# Patient Record
Sex: Male | Born: 1940 | Race: White | Hispanic: No | Smoking: Never smoker
Health system: Southern US, Community
[De-identification: ages and names within clinical notes are randomized; demographics above are authoritative.]

## PROBLEM LIST (undated history)

## (undated) DIAGNOSIS — R0602 Shortness of breath: Secondary | ICD-10-CM

## (undated) DIAGNOSIS — G4730 Sleep apnea, unspecified: Secondary | ICD-10-CM

## (undated) DIAGNOSIS — E785 Hyperlipidemia, unspecified: Secondary | ICD-10-CM

## (undated) DIAGNOSIS — E039 Hypothyroidism, unspecified: Secondary | ICD-10-CM

## (undated) DIAGNOSIS — K219 Gastro-esophageal reflux disease without esophagitis: Secondary | ICD-10-CM

## (undated) DIAGNOSIS — I219 Acute myocardial infarction, unspecified: Secondary | ICD-10-CM

## (undated) DIAGNOSIS — Z800 Family history of malignant neoplasm of digestive organs: Secondary | ICD-10-CM

## (undated) DIAGNOSIS — J309 Allergic rhinitis, unspecified: Secondary | ICD-10-CM

## (undated) DIAGNOSIS — Z8249 Family history of ischemic heart disease and other diseases of the circulatory system: Secondary | ICD-10-CM

## (undated) DIAGNOSIS — C801 Malignant (primary) neoplasm, unspecified: Secondary | ICD-10-CM

## (undated) DIAGNOSIS — E662 Morbid (severe) obesity with alveolar hypoventilation: Secondary | ICD-10-CM

## (undated) DIAGNOSIS — I10 Essential (primary) hypertension: Secondary | ICD-10-CM

## (undated) DIAGNOSIS — J449 Chronic obstructive pulmonary disease, unspecified: Secondary | ICD-10-CM

## (undated) DIAGNOSIS — Q859 Phakomatosis, unspecified: Secondary | ICD-10-CM

## (undated) DIAGNOSIS — M1990 Unspecified osteoarthritis, unspecified site: Secondary | ICD-10-CM

## (undated) DIAGNOSIS — I2510 Atherosclerotic heart disease of native coronary artery without angina pectoris: Secondary | ICD-10-CM

## (undated) DIAGNOSIS — J189 Pneumonia, unspecified organism: Secondary | ICD-10-CM

## (undated) HISTORY — DX: Chronic obstructive pulmonary disease, unspecified: J44.9

## (undated) HISTORY — DX: Phakomatosis, unspecified: Q85.9

## (undated) HISTORY — PX: EYE SURGERY: SHX253

## (undated) HISTORY — DX: Sleep apnea, unspecified: G47.30

## (undated) HISTORY — PX: CARPAL TUNNEL RELEASE: SHX101

## (undated) HISTORY — DX: Essential (primary) hypertension: I10

## (undated) HISTORY — DX: Morbid (severe) obesity with alveolar hypoventilation: E66.2

## (undated) HISTORY — PX: KNEE ARTHROPLASTY: SHX992

## (undated) HISTORY — PX: THYROIDECTOMY: SHX17

## (undated) HISTORY — PX: JOINT REPLACEMENT: SHX530

## (undated) HISTORY — DX: Allergic rhinitis, unspecified: J30.9

## (undated) HISTORY — DX: Family history of malignant neoplasm of digestive organs: Z80.0

## (undated) HISTORY — DX: Family history of ischemic heart disease and other diseases of the circulatory system: Z82.49

## (undated) HISTORY — DX: Hyperlipidemia, unspecified: E78.5

---

## 2003-10-02 ENCOUNTER — Inpatient Hospital Stay (HOSPITAL_COMMUNITY): Admission: EM | Admit: 2003-10-02 | Discharge: 2003-10-09 | Payer: Self-pay | Admitting: Emergency Medicine

## 2004-04-22 ENCOUNTER — Emergency Department (HOSPITAL_COMMUNITY): Admission: EM | Admit: 2004-04-22 | Discharge: 2004-04-22 | Payer: Self-pay | Admitting: Emergency Medicine

## 2004-08-07 ENCOUNTER — Encounter: Admission: RE | Admit: 2004-08-07 | Discharge: 2004-08-07 | Payer: Self-pay | Admitting: Family Medicine

## 2004-08-15 ENCOUNTER — Inpatient Hospital Stay (HOSPITAL_COMMUNITY): Admission: RE | Admit: 2004-08-15 | Discharge: 2004-08-18 | Payer: Self-pay | Admitting: Orthopedic Surgery

## 2004-08-15 ENCOUNTER — Ambulatory Visit: Payer: Self-pay | Admitting: Physical Medicine & Rehabilitation

## 2004-08-18 ENCOUNTER — Inpatient Hospital Stay
Admission: RE | Admit: 2004-08-18 | Discharge: 2004-08-29 | Payer: Self-pay | Admitting: Physical Medicine & Rehabilitation

## 2004-08-24 ENCOUNTER — Ambulatory Visit: Payer: Self-pay | Admitting: Physical Medicine & Rehabilitation

## 2004-10-16 ENCOUNTER — Ambulatory Visit (HOSPITAL_COMMUNITY): Admission: RE | Admit: 2004-10-16 | Discharge: 2004-10-16 | Payer: Self-pay | Admitting: Family Medicine

## 2005-04-04 ENCOUNTER — Ambulatory Visit (HOSPITAL_COMMUNITY): Admission: RE | Admit: 2005-04-04 | Discharge: 2005-04-04 | Payer: Self-pay | Admitting: Gastroenterology

## 2005-04-27 ENCOUNTER — Emergency Department (HOSPITAL_COMMUNITY): Admission: EM | Admit: 2005-04-27 | Discharge: 2005-04-27 | Payer: Self-pay | Admitting: Emergency Medicine

## 2005-06-18 ENCOUNTER — Encounter: Admission: RE | Admit: 2005-06-18 | Discharge: 2005-06-18 | Payer: Self-pay | Admitting: Family Medicine

## 2005-07-23 ENCOUNTER — Encounter: Admission: RE | Admit: 2005-07-23 | Discharge: 2005-08-20 | Payer: Self-pay | Admitting: Neurosurgery

## 2005-10-02 ENCOUNTER — Ambulatory Visit (HOSPITAL_BASED_OUTPATIENT_CLINIC_OR_DEPARTMENT_OTHER): Admission: RE | Admit: 2005-10-02 | Discharge: 2005-10-02 | Payer: Self-pay | Admitting: Family Medicine

## 2005-10-06 ENCOUNTER — Ambulatory Visit: Payer: Self-pay | Admitting: Internal Medicine

## 2005-11-13 ENCOUNTER — Ambulatory Visit (HOSPITAL_BASED_OUTPATIENT_CLINIC_OR_DEPARTMENT_OTHER): Admission: RE | Admit: 2005-11-13 | Discharge: 2005-11-13 | Payer: Self-pay | Admitting: Family Medicine

## 2005-11-17 ENCOUNTER — Ambulatory Visit: Payer: Self-pay | Admitting: Internal Medicine

## 2006-02-12 ENCOUNTER — Inpatient Hospital Stay (HOSPITAL_COMMUNITY): Admission: RE | Admit: 2006-02-12 | Discharge: 2006-02-18 | Payer: Self-pay | Admitting: Orthopedic Surgery

## 2006-02-13 ENCOUNTER — Ambulatory Visit: Payer: Self-pay | Admitting: Physical Medicine & Rehabilitation

## 2006-09-05 ENCOUNTER — Ambulatory Visit (HOSPITAL_BASED_OUTPATIENT_CLINIC_OR_DEPARTMENT_OTHER): Admission: RE | Admit: 2006-09-05 | Discharge: 2006-09-05 | Payer: Self-pay | Admitting: Orthopedic Surgery

## 2007-03-27 ENCOUNTER — Emergency Department (HOSPITAL_COMMUNITY): Admission: EM | Admit: 2007-03-27 | Discharge: 2007-03-27 | Payer: Self-pay | Admitting: Emergency Medicine

## 2007-07-16 ENCOUNTER — Encounter: Admission: RE | Admit: 2007-07-16 | Discharge: 2007-07-16 | Payer: Self-pay | Admitting: Ophthalmology

## 2007-07-23 ENCOUNTER — Ambulatory Visit: Payer: Self-pay | Admitting: Pulmonary Disease

## 2007-08-13 ENCOUNTER — Ambulatory Visit: Payer: Self-pay | Admitting: Pulmonary Disease

## 2007-08-24 ENCOUNTER — Ambulatory Visit (HOSPITAL_COMMUNITY): Admission: RE | Admit: 2007-08-24 | Discharge: 2007-08-25 | Payer: Self-pay | Admitting: Orthopedic Surgery

## 2007-09-21 DIAGNOSIS — E785 Hyperlipidemia, unspecified: Secondary | ICD-10-CM

## 2007-09-21 DIAGNOSIS — I110 Hypertensive heart disease with heart failure: Secondary | ICD-10-CM

## 2007-09-21 DIAGNOSIS — R05 Cough: Secondary | ICD-10-CM

## 2007-09-21 DIAGNOSIS — J309 Allergic rhinitis, unspecified: Secondary | ICD-10-CM

## 2007-09-21 DIAGNOSIS — G4730 Sleep apnea, unspecified: Secondary | ICD-10-CM | POA: Insufficient documentation

## 2007-09-22 ENCOUNTER — Encounter: Payer: Self-pay | Admitting: Endocrinology

## 2007-10-01 ENCOUNTER — Ambulatory Visit: Payer: Self-pay | Admitting: Pulmonary Disease

## 2007-10-01 DIAGNOSIS — R0602 Shortness of breath: Secondary | ICD-10-CM | POA: Insufficient documentation

## 2007-10-22 HISTORY — PX: CHOLECYSTECTOMY: SHX55

## 2007-10-27 ENCOUNTER — Ambulatory Visit: Payer: Self-pay | Admitting: Pulmonary Disease

## 2007-11-03 ENCOUNTER — Ambulatory Visit (HOSPITAL_COMMUNITY): Admission: RE | Admit: 2007-11-03 | Discharge: 2007-11-03 | Payer: Self-pay | Admitting: Pulmonary Disease

## 2007-11-08 ENCOUNTER — Telehealth: Payer: Self-pay | Admitting: Pulmonary Disease

## 2007-11-09 ENCOUNTER — Telehealth (INDEPENDENT_AMBULATORY_CARE_PROVIDER_SITE_OTHER): Payer: Self-pay | Admitting: *Deleted

## 2008-01-12 ENCOUNTER — Encounter: Payer: Self-pay | Admitting: Endocrinology

## 2008-01-19 ENCOUNTER — Encounter: Payer: Self-pay | Admitting: Endocrinology

## 2008-01-27 ENCOUNTER — Ambulatory Visit (HOSPITAL_BASED_OUTPATIENT_CLINIC_OR_DEPARTMENT_OTHER): Admission: RE | Admit: 2008-01-27 | Discharge: 2008-01-27 | Payer: Self-pay | Admitting: Ophthalmology

## 2008-02-04 ENCOUNTER — Encounter: Payer: Self-pay | Admitting: Endocrinology

## 2008-03-01 ENCOUNTER — Encounter: Payer: Self-pay | Admitting: Endocrinology

## 2008-03-08 ENCOUNTER — Encounter: Payer: Self-pay | Admitting: Endocrinology

## 2008-03-31 ENCOUNTER — Ambulatory Visit: Payer: Self-pay | Admitting: Endocrinology

## 2008-03-31 DIAGNOSIS — E890 Postprocedural hypothyroidism: Secondary | ICD-10-CM

## 2008-03-31 DIAGNOSIS — Q859 Phakomatosis, unspecified: Secondary | ICD-10-CM

## 2008-03-31 DIAGNOSIS — E209 Hypoparathyroidism, unspecified: Secondary | ICD-10-CM | POA: Insufficient documentation

## 2008-03-31 LAB — CONVERTED CEMR LAB: Magnesium: 2.1 mg/dL (ref 1.5–2.5)

## 2008-04-13 ENCOUNTER — Telehealth: Payer: Self-pay | Admitting: Pulmonary Disease

## 2008-04-14 ENCOUNTER — Ambulatory Visit: Payer: Self-pay | Admitting: Endocrinology

## 2008-04-15 ENCOUNTER — Encounter: Payer: Self-pay | Admitting: Endocrinology

## 2008-04-19 ENCOUNTER — Encounter: Payer: Self-pay | Admitting: Endocrinology

## 2008-04-19 LAB — CONVERTED CEMR LAB: PTH: <2.5 pg/mL — ABNORMAL LOW (ref 14.0–72.0)

## 2008-04-28 ENCOUNTER — Encounter: Payer: Self-pay | Admitting: Pulmonary Disease

## 2008-04-28 ENCOUNTER — Ambulatory Visit: Payer: Self-pay | Admitting: Pulmonary Disease

## 2008-04-28 ENCOUNTER — Ambulatory Visit: Payer: Self-pay | Admitting: Cardiology

## 2008-04-28 ENCOUNTER — Ambulatory Visit: Payer: Self-pay

## 2008-04-28 ENCOUNTER — Telehealth (INDEPENDENT_AMBULATORY_CARE_PROVIDER_SITE_OTHER): Payer: Self-pay | Admitting: *Deleted

## 2008-04-28 DIAGNOSIS — R930 Abnormal findings on diagnostic imaging of skull and head, not elsewhere classified: Secondary | ICD-10-CM

## 2008-04-28 DIAGNOSIS — R079 Chest pain, unspecified: Secondary | ICD-10-CM

## 2008-04-29 ENCOUNTER — Ambulatory Visit (HOSPITAL_COMMUNITY): Admission: RE | Admit: 2008-04-29 | Discharge: 2008-04-29 | Payer: Self-pay | Admitting: Pulmonary Disease

## 2008-05-03 ENCOUNTER — Telehealth: Payer: Self-pay | Admitting: Pulmonary Disease

## 2008-05-04 ENCOUNTER — Encounter: Payer: Self-pay | Admitting: Pulmonary Disease

## 2008-05-04 DIAGNOSIS — K8020 Calculus of gallbladder without cholecystitis without obstruction: Secondary | ICD-10-CM | POA: Insufficient documentation

## 2008-05-06 ENCOUNTER — Telehealth (INDEPENDENT_AMBULATORY_CARE_PROVIDER_SITE_OTHER): Payer: Self-pay | Admitting: *Deleted

## 2008-05-10 ENCOUNTER — Ambulatory Visit (HOSPITAL_COMMUNITY): Admission: RE | Admit: 2008-05-10 | Discharge: 2008-05-10 | Payer: Self-pay | Admitting: Pulmonary Disease

## 2008-06-02 ENCOUNTER — Telehealth: Payer: Self-pay | Admitting: Pulmonary Disease

## 2008-08-15 ENCOUNTER — Encounter (INDEPENDENT_AMBULATORY_CARE_PROVIDER_SITE_OTHER): Payer: Self-pay | Admitting: General Surgery

## 2008-08-15 ENCOUNTER — Ambulatory Visit (HOSPITAL_COMMUNITY): Admission: RE | Admit: 2008-08-15 | Discharge: 2008-08-16 | Payer: Self-pay | Admitting: General Surgery

## 2008-12-29 ENCOUNTER — Inpatient Hospital Stay (HOSPITAL_COMMUNITY): Admission: EM | Admit: 2008-12-29 | Discharge: 2008-12-31 | Payer: Self-pay | Admitting: Emergency Medicine

## 2008-12-30 ENCOUNTER — Encounter: Payer: Self-pay | Admitting: Pulmonary Disease

## 2008-12-30 ENCOUNTER — Encounter (INDEPENDENT_AMBULATORY_CARE_PROVIDER_SITE_OTHER): Payer: Self-pay | Admitting: Internal Medicine

## 2009-02-14 ENCOUNTER — Ambulatory Visit: Payer: Self-pay | Admitting: Pulmonary Disease

## 2009-02-14 DIAGNOSIS — R918 Other nonspecific abnormal finding of lung field: Secondary | ICD-10-CM

## 2009-02-14 DIAGNOSIS — E678 Other specified hyperalimentation: Secondary | ICD-10-CM

## 2009-02-15 ENCOUNTER — Telehealth (INDEPENDENT_AMBULATORY_CARE_PROVIDER_SITE_OTHER): Payer: Self-pay | Admitting: *Deleted

## 2009-06-13 ENCOUNTER — Inpatient Hospital Stay (HOSPITAL_COMMUNITY): Admission: EM | Admit: 2009-06-13 | Discharge: 2009-06-15 | Payer: Self-pay | Admitting: Emergency Medicine

## 2009-06-14 ENCOUNTER — Encounter (INDEPENDENT_AMBULATORY_CARE_PROVIDER_SITE_OTHER): Payer: Self-pay | Admitting: Internal Medicine

## 2009-06-15 ENCOUNTER — Encounter (INDEPENDENT_AMBULATORY_CARE_PROVIDER_SITE_OTHER): Payer: Self-pay | Admitting: Internal Medicine

## 2009-08-02 ENCOUNTER — Encounter (HOSPITAL_BASED_OUTPATIENT_CLINIC_OR_DEPARTMENT_OTHER): Admission: RE | Admit: 2009-08-02 | Discharge: 2009-09-21 | Payer: Self-pay | Admitting: Internal Medicine

## 2009-08-28 ENCOUNTER — Encounter: Payer: Self-pay | Admitting: Pulmonary Disease

## 2009-11-08 ENCOUNTER — Emergency Department (HOSPITAL_COMMUNITY): Admission: EM | Admit: 2009-11-08 | Discharge: 2009-11-08 | Payer: Self-pay | Admitting: Emergency Medicine

## 2009-11-28 ENCOUNTER — Inpatient Hospital Stay (HOSPITAL_COMMUNITY): Admission: EM | Admit: 2009-11-28 | Discharge: 2009-12-08 | Payer: Self-pay | Admitting: Emergency Medicine

## 2009-11-28 ENCOUNTER — Ambulatory Visit: Payer: Self-pay | Admitting: Emergency Medicine

## 2009-12-08 ENCOUNTER — Inpatient Hospital Stay: Admission: RE | Admit: 2009-12-08 | Discharge: 2009-12-29 | Payer: Self-pay | Admitting: Internal Medicine

## 2010-04-18 ENCOUNTER — Emergency Department (HOSPITAL_COMMUNITY): Admission: EM | Admit: 2010-04-18 | Discharge: 2010-04-18 | Payer: Self-pay | Admitting: Emergency Medicine

## 2010-05-03 ENCOUNTER — Ambulatory Visit (HOSPITAL_COMMUNITY): Admission: RE | Admit: 2010-05-03 | Discharge: 2010-05-03 | Payer: Self-pay | Admitting: Gastroenterology

## 2010-06-20 ENCOUNTER — Emergency Department (HOSPITAL_COMMUNITY): Admission: EM | Admit: 2010-06-20 | Discharge: 2010-06-20 | Payer: Self-pay | Admitting: Emergency Medicine

## 2010-07-02 ENCOUNTER — Ambulatory Visit: Payer: Self-pay | Admitting: Pulmonary Disease

## 2010-07-02 DIAGNOSIS — J986 Disorders of diaphragm: Secondary | ICD-10-CM

## 2010-07-08 ENCOUNTER — Ambulatory Visit: Payer: Self-pay | Admitting: Cardiology

## 2010-07-08 ENCOUNTER — Observation Stay (HOSPITAL_COMMUNITY): Admission: EM | Admit: 2010-07-08 | Discharge: 2010-07-11 | Payer: Self-pay | Admitting: Emergency Medicine

## 2010-07-10 ENCOUNTER — Encounter (INDEPENDENT_AMBULATORY_CARE_PROVIDER_SITE_OTHER): Payer: Self-pay | Admitting: Internal Medicine

## 2010-08-29 ENCOUNTER — Encounter: Admission: RE | Admit: 2010-08-29 | Discharge: 2010-08-29 | Payer: Self-pay | Admitting: Internal Medicine

## 2010-09-26 ENCOUNTER — Emergency Department (HOSPITAL_COMMUNITY)
Admission: EM | Admit: 2010-09-26 | Discharge: 2010-09-26 | Payer: Self-pay | Source: Home / Self Care | Admitting: Family Medicine

## 2010-11-11 ENCOUNTER — Encounter: Payer: Self-pay | Admitting: Neurosurgery

## 2010-11-11 ENCOUNTER — Encounter: Payer: Self-pay | Admitting: Pulmonary Disease

## 2010-11-12 ENCOUNTER — Encounter: Payer: Self-pay | Admitting: Neurosurgery

## 2010-11-16 ENCOUNTER — Encounter
Admission: RE | Admit: 2010-11-16 | Discharge: 2010-11-16 | Payer: Self-pay | Source: Home / Self Care | Attending: Internal Medicine | Admitting: Internal Medicine

## 2010-11-18 LAB — CONVERTED CEMR LAB
BUN: 22 mg/dL (ref 6–23)
Creatinine, Ser: 1.8 mg/dL — ABNORMAL HIGH (ref 0.4–1.5)

## 2010-11-20 NOTE — Assessment & Plan Note (Signed)
Summary: rov for OHS, dyspnea   Primary Provider/Referring Provider:  Dr Allena Katz  CC:  last seen 01/2009. Pt was seen in hospital 11/2009 for resp. failure.[name] [name]  History of Present Illness: The pt comes in today for f/u of his known OHS and dyspnea related to his obesity/deconditioning/left HD dysfunction.  He has been doing well over the last 6mos, but is wondering if he needs oxygen with exertion.  He has been told by physical therapist this may help.  He denies any cough, congestion, or mucus.  He is wearing his oxygen compliantly at HS.  Preventive Screening-Counseling & Management  Alcohol-Tobacco     Smoking Status: never  Current Medications (verified): 1)  Synthroid 200 Mcg  Tabs (Levothyroxine Sodium) .... Take One Tab By Mouth Once Daily 2)  Plavix 75 Mg  Tabs (Clopidogrel Bisulfate) .... Take One Tab By Mouth Once Daily 3)  Astelin 137 Mcg/spray  Soln (Azelastine Hcl) .... Inhale One Spray in Each Nostril Two Times A Day 4)  Miralax   Powd (Polyethylene Glycol 3350) .... Use As Needed 5)  Nitrolingual 0.4 Mg/spray Soln (Nitroglycerin) .... Use As Directed As Needed For Chest Pain 6)  Flomax 0.4 Mg Xr24h-Cap (Tamsulosin Hcl) .... Take 1 Tablet By Mouth Once A Day 7)  Hydrochlorothiazide 12.5 Mg Caps (Hydrochlorothiazide) .... Take 1 Tablet By Mouth Once A Day 8)  Lyrica 75 Mg Caps (Pregabalin) .... Take 1 Tablet By Mouth Once A Day 9)  Veramyst 27.5 Mcg/spray Susp (Fluticasone Furoate) .... 2 Sprays in Each Nostril Once Daily 10)  Nystatin-Triamcinolone 100000-0.1 Unit/gm-% Crea (Nystatin-Triamcinolone) .... Use As Directed As Needed 11)  Zofran 4 Mg Tabs (Ondansetron Hcl) .[name] [name].. 1 Tab Three Times A Day 12)  Rocaltrol 0.5 Mcg Caps (Calcitriol) .[name] [name].. 1 Tab Two Times A Day 13)  Pantoprazole Sodium 40 Mg Tbec (Pantoprazole Sodium) .... One Tab Once Daily 14)  Aspirin 81 Mg Tabs (Aspirin) .... Once Daily 15)  Lorazepam 0.5 Mg Tabs (Lorazepam) .... 1/2 Tab Three Times A Day 16)  Melatonin 3  Mg Tabs (Melatonin) .... Once Daily 17)  Dulcolax 5 Mg Tbec (Bisacodyl) .... Take 2 Tab As Needed 18)  Tramadol Hcl 50 Mg Tabs (Tramadol Hcl) .[name] [name].. 1 Tab By Mouth Every 4 Hours As Needed 19)  Arthrotec 75-200 Mg-Mcg Tabs (Diclofenac-Misoprostol) .... Two Times A Day 20)  Plavix 75 Mg Tabs (Clopidogrel Bisulfate) .... 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  The patient denies productive cough, non-productive cough, coughing up blood, irregular heartbeats, acid heartburn, indigestion, loss of appetite, weight change, abdominal pain, difficulty swallowing, sore throat, tooth/dental problems, headaches, sneezing, itching, ear ache, rash, change in color of mucus, and fever.    Vital Signs:  Patient profile:   70 year old male Height:      74 inches Weight:      301.50 pounds BMI:     38.85 O2 Sat:      90 % on Room air Pulse rate:   76 / minute Resp:     98.0 per minute BP sitting:  90 / 60  (right arm) Cuff size:   large  Vitals Entered By: Carver Fila (July 02, 2010 9:55 AM)  O2 Flow:  Room air CC: last seen 01/2009. Pt was seen in hospital 11/2009 for resp. failure. Comments Phone number updated Carver Fila  July 02, 2010 9:55 AM    Physical Exam  General:  obese male in nad Lungs:  minimal basilar crackles, no wheezing or rhonchi Heart:  rrr, no mrg Extremities:  1+ edema, a few open sores  that need to be covered. no cyanosis  Neurologic:  alert, oriented,moves all 4.   Impression & Recommendations:  Problem # 1:  OBESITY HYPOVENTILATION SYNDROME (ICD-278.8) the pt wears oxygen at HS, and has failed trials of cpap/bipap under the direction of Dr. Vickey Huger in the past.  I have asked him to continue to exercise, work on weight loss, and to wear oxygen at HS.  Surprisingly, he did not qualify for oxygen with exertion, but did get very sob.  Medications Added to Medication List This Visit: 1)  Zofran 4 Mg Tabs (Ondansetron hcl) .[name] [name].. 1 tab three times a day 2)  Rocaltrol 0.5 Mcg Caps (Calcitriol) .[name] [name].. 1 tab two times a day 3)  Pantoprazole Sodium 40 Mg Tbec (Pantoprazole sodium) .... One tab once daily 4)  Aspirin 81 Mg Tabs (Aspirin) .... Once daily 5)  Lorazepam 0.5 Mg Tabs (Lorazepam) .... 1/2 tab three times a day 6)  Melatonin 3 Mg Tabs (Melatonin) .... Once daily 7)  Dulcolax 5 Mg Tbec (Bisacodyl) .... Take 2 tab as needed 8)  Tramadol Hcl 50 Mg Tabs (Tramadol hcl) .[name] [name].. 1 tab by mouth every 4 hours as needed 9)  Arthrotec 75-200 Mg-mcg Tabs (Diclofenac-misoprostol) .... Two times a day 10)  Plavix 75 Mg Tabs (Clopidogrel bisulfate) .... Once daily  Other Orders: Est. Patient Level III (47829) Pulse Oximetry, Ambulatory (56213)  Patient Instructions: 1)  work on weight loss 2)  stay on oxygen while sleeping.  You do not need oxygen with walking. 3)  followup with me in one year or sooner if having problems.   .Ambulatory Pulse Oximetry  Resting; HR___78__    02 Sat__94 ra___  Lap1 (185 feet)   HR__101___   02 Sat94 ra_____ Lap2 (185 feet)   HR__100___   02 Sat_94 ra____    Lap3 (185 feet)   HR__106___   02 Sat_95____  __x_Test Completed without Difficulty pt was  panting   .Kandice Hams CMA  July 02, 2010 10:32 AM   ___Test Stopped due to:

## 2010-11-28 ENCOUNTER — Other Ambulatory Visit: Payer: Self-pay | Admitting: Internal Medicine

## 2010-11-28 DIAGNOSIS — R0600 Dyspnea, unspecified: Secondary | ICD-10-CM

## 2010-11-28 DIAGNOSIS — R634 Abnormal weight loss: Secondary | ICD-10-CM

## 2010-12-03 ENCOUNTER — Ambulatory Visit
Admission: RE | Admit: 2010-12-03 | Discharge: 2010-12-03 | Disposition: A | Discharge status: Home / Self Care | Payer: Medicare Other | Source: Ambulatory Visit | Attending: Internal Medicine | Admitting: Internal Medicine

## 2010-12-03 DIAGNOSIS — R0600 Dyspnea, unspecified: Secondary | ICD-10-CM

## 2010-12-03 DIAGNOSIS — R634 Abnormal weight loss: Secondary | ICD-10-CM

## 2010-12-03 MED ORDER — IOHEXOL 300 MG/ML  SOLN
75.0000 mL | Freq: Once | INTRAMUSCULAR | Status: AC | PRN
  Administered 2010-12-03: 75 mL via INTRAVENOUS

## 2010-12-13 ENCOUNTER — Observation Stay (HOSPITAL_COMMUNITY)
Admission: EM | Admit: 2010-12-13 | Discharge: 2010-12-14 | Disposition: A | Discharge status: Skilled Nursing Facility | Payer: Medicare Other | Attending: Internal Medicine | Admitting: Internal Medicine

## 2010-12-13 ENCOUNTER — Emergency Department (HOSPITAL_COMMUNITY): Payer: Medicare Other

## 2010-12-13 DIAGNOSIS — I2510 Atherosclerotic heart disease of native coronary artery without angina pectoris: Secondary | ICD-10-CM | POA: Insufficient documentation

## 2010-12-13 DIAGNOSIS — M1990 Unspecified osteoarthritis, unspecified site: Secondary | ICD-10-CM | POA: Insufficient documentation

## 2010-12-13 DIAGNOSIS — E785 Hyperlipidemia, unspecified: Secondary | ICD-10-CM | POA: Insufficient documentation

## 2010-12-13 DIAGNOSIS — I5030 Unspecified diastolic (congestive) heart failure: Secondary | ICD-10-CM | POA: Insufficient documentation

## 2010-12-13 DIAGNOSIS — K219 Gastro-esophageal reflux disease without esophagitis: Secondary | ICD-10-CM | POA: Insufficient documentation

## 2010-12-13 DIAGNOSIS — E039 Hypothyroidism, unspecified: Secondary | ICD-10-CM | POA: Insufficient documentation

## 2010-12-13 DIAGNOSIS — R079 Chest pain, unspecified: Principal | ICD-10-CM | POA: Insufficient documentation

## 2010-12-13 DIAGNOSIS — R0609 Other forms of dyspnea: Secondary | ICD-10-CM | POA: Insufficient documentation

## 2010-12-13 DIAGNOSIS — I10 Essential (primary) hypertension: Secondary | ICD-10-CM | POA: Insufficient documentation

## 2010-12-13 DIAGNOSIS — F411 Generalized anxiety disorder: Secondary | ICD-10-CM | POA: Insufficient documentation

## 2010-12-13 DIAGNOSIS — F0390 Unspecified dementia without behavioral disturbance: Secondary | ICD-10-CM | POA: Insufficient documentation

## 2010-12-13 DIAGNOSIS — R0989 Other specified symptoms and signs involving the circulatory and respiratory systems: Secondary | ICD-10-CM | POA: Insufficient documentation

## 2010-12-13 LAB — DIFFERENTIAL
Basophils Absolute: 0 10*3/uL (ref 0.0–0.1)
Basophils Relative: 0 % (ref 0–1)
Eosinophils Absolute: 0.1 10*3/uL (ref 0.0–0.7)
Eosinophils Relative: 1 % (ref 0–5)
Lymphocytes Relative: 15 % (ref 12–46)

## 2010-12-13 LAB — COMPREHENSIVE METABOLIC PANEL
Alkaline Phosphatase: 78 U/L (ref 39–117)
BUN: 11 mg/dL (ref 6–23)
CO2: 36 mEq/L — ABNORMAL HIGH (ref 19–32)
Chloride: 98 mEq/L (ref 96–112)
Creatinine, Ser: 1.15 mg/dL (ref 0.4–1.5)
GFR calc non Af Amer: >60 mL/min (ref >60)
Potassium: 4.1 mEq/L (ref 3.5–5.1)
Total Bilirubin: 0.6 mg/dL (ref 0.3–1.2)

## 2010-12-13 LAB — PROTIME-INR
INR: 0.92 (ref 0.00–1.49)
Prothrombin Time: 12.6 seconds (ref 11.6–15.2)

## 2010-12-13 LAB — TROPONIN I: Troponin I: 0.01 ng/mL (ref 0.00–0.06)

## 2010-12-13 LAB — POCT CARDIAC MARKERS

## 2010-12-13 LAB — CARDIAC PANEL(CRET KIN+CKTOT+MB+TROPI)
Relative Index: INVALID (ref 0.0–2.5)
Total CK: 35 U/L (ref 7–232)
Troponin I: 0.01 ng/mL (ref 0.00–0.06)

## 2010-12-13 LAB — CBC
Platelets: 190 10*3/uL (ref 150–400)
RDW: 13 % (ref 11.5–15.5)
WBC: 8.5 10*3/uL (ref 4.0–10.5)

## 2010-12-13 LAB — BRAIN NATRIURETIC PEPTIDE: Pro B Natriuretic peptide (BNP): <30 pg/mL (ref 0.0–100.0)

## 2010-12-13 LAB — GLUCOSE, CAPILLARY: Glucose-Capillary: 93 mg/dL (ref 70–99)

## 2010-12-14 LAB — CARDIAC PANEL(CRET KIN+CKTOT+MB+TROPI)
Relative Index: INVALID (ref 0.0–2.5)
Troponin I: 0.02 ng/mL (ref 0.00–0.06)

## 2010-12-14 LAB — BASIC METABOLIC PANEL
BUN: 11 mg/dL (ref 6–23)
Calcium: 8.1 mg/dL — ABNORMAL LOW (ref 8.4–10.5)
Chloride: 101 mEq/L (ref 96–112)
Creatinine, Ser: 1.15 mg/dL (ref 0.4–1.5)
GFR calc Af Amer: >60 mL/min (ref >60)
GFR calc non Af Amer: >60 mL/min (ref >60)

## 2010-12-14 LAB — CBC
MCH: 32 pg (ref 26.0–34.0)
MCV: 95.5 fL (ref 78.0–100.0)
Platelets: 186 10*3/uL (ref 150–400)
RBC: 3.97 MIL/uL — ABNORMAL LOW (ref 4.22–5.81)
RDW: 13 % (ref 11.5–15.5)

## 2010-12-15 NOTE — H&P (Signed)
[name] [name], [name] [name] NO.:  [id number]  MEDICAL RECORD NO.:  [id number]           PATIENT TYPE:  E  LOCATION:  [facility]                         FACILITY:  [facility]  PHYSICIAN:  Vania Rea, M.D. DATE OF BIRTH:  21-Jan-1941  DATE OF ADMISSION:  12/13/2010 DATE OF DISCHARGE:                             HISTORY & PHYSICAL   PRIMARY CARE PHYSICIAN:  Doran Durand, MD at [facility].  NEUROLOGIST:  Melvyn Novas, MD  CHIEF COMPLAINT:  Chest tightness relieved by oxygen.  HISTORY OF PRESENT ILLNESS:  This is a 70 year old Caucasian gentleman with multiple medical problems including coronary artery disease, CHF, hypertension, mild dementia, and GERD, who lives at an assisted living facility and has been prescribed nocturnal oxygen.  Although he is prescribed oxygen to be used nocturnally.  The patient uses it all the time whenever possible because he notices that whenever he does not use it, he gets the chest tightness.  He describes episodes of having been away from the facility for about 4 hours without oxygen (because he does not have a portable unit) and being having severe discomfort for the entire time and then on returning home, he puts on his oxygen and the discomfort goes away completely.  The patient reportedly had appointment with his neurologist this morning and in neurologist office, he was noted to be very short of breath and complaining of chest tightness and was sent down to the [facility] Emergency Room to be evaluated.  In the emergency room, the patient received oxygen and his chest discomfort resolved.  He was noted to be saturating at 88% on room air in the emergency room.  The patient denies any diaphoresis.  He denies any radiation into his neck or chest.  He denies any nausea or vomiting.  PAST MEDICAL HISTORY: 1. Coronary artery disease, although the details of this are unclear. 2. History of diastolic heart failure,  hypertension, mild history of     mild dementia. 3. 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 4. GERD. 5. Hypothyroidism. 6. Anxiety.  MEDICATIONS: 1. MiraLax daily as needed. 2. Protonix 40 daily. 3. Phenergan at 12.5 mg every 8 hours as needed. 4. Oxy-IR every 6 hours as needed. 5. Tylenol 500 mg every 6 hours as needed. 6. Lorazepam 0.5 mg three times daily as needed. 7. Xanax 0.5 mg twice daily. 8. Artificial tears 1 drop to both eyes twice daily. 9. Levothroid 150 mcg daily. 10.Trazodone 100 mg at bedtime as needed. 11.Ambien 10 mg at bedtime. 12.Tramadol 50 mg every 4 hours as needed. 13.Vitamin B12 of 1000 units each evening. 14.Bisacodyl 2 tablets daily as needed for constipation. 15.Zocor 20 mg at bedtime. 16.Melatonin 1 tablet at bedtime. 17.Lopressor 0.5 mg twice daily. 18.Calcitriol 0.5 mg twice daily. 19.Flonase nasal spray 2 sprays daily. 20.Flomax 0.4 mg daily. 21.Plavix 75 mg daily. 22.HCTZ 12.5 mg daily. 23.Aspirin coated 325 mg daily.  ALLERGIES:  No known drug allergies.  SOCIAL HISTORY:  No history of tobacco, alcohol, or illicit drug use. Resident of an assisted living facility.  REVIEW OF SYSTEMS:  Other than noted above, unable to obtain.  PHYSICAL EXAM:  GENERAL:  Obese, elderly Caucasian gentleman, sitting up in the stretcher, not distressed at this time.  He is alert and oriented to place, person, and time. VITALS:  His temperature is 98, his respiration 20, pulse 63, blood pressure 119/97, he is saturating at 98% on 2 L. HEENT:  Pupils round and equal.  Mucous membranes pink.  Anicteric.  No thyromegaly or carotid bruits. CHEST:  Clear to auscultation bilaterally. CARDIOVASCULAR:  Regular rate and rhythm.  No murmur. ABDOMEN:  Obese, soft, nontender. EXTREMITIES:  No edema. CENTRAL NERVOUS SYSTEM:  Cranial nerves II through XII are grossly intact.  He has no focal neurologic deficit.  LABORATORY DATA:  His white count is 8.5, hemoglobin 12.9,  platelets 190, has a normal differential.  His sodium is 143, potassium 4.1, chloride 98, CO2 of 36, glucose 97, BUN 11, creatinine 1.1.  His albumin is 3.3.  His liver functions are otherwise unremarkable.  His calcium is 8.2.  His cardiac enzymes are completely normal with undetectable troponin and undetectable CK-MB.  His beta-type natriuretic peptide is undetectable.  His portable chest x-ray shows cardiac enlargement, left basilar atelectasis.  He has known chronic elevation of left diaphragm. His EKG shows normal sinus rhythm with borderline intraventricular conduction delay.  ASSESSMENT: 1. Angina as evidenced by dyspnea and chest tightness, relieved by     oxygen. 2. Coronary artery disease by history. 3. Diastolic heart failure by echo in September 2011. 4. Hypertension. 5. Questionable dementia. 6. Hypothyroidism. 7. Gastroesophageal reflux disease.  PLAN: 1. We will bring this gentleman on observation.  We will contact     social worker just supply him with portable     oxygen, but I think he does need a cardiac evaluation because his     problem may be aggravated by an element of coronary artery disease     obstruction. 2. Other plans as per orders.     Vania Rea, M.D.     LC/MEDQ  D:  12/13/2010  T:  12/13/2010  Job:  [id number]  cc:   Doran Durand, MD  Electronically Signed by Vania Rea M.D. on 12/15/2010 05:04:21 AM

## 2010-12-20 NOTE — Discharge Summary (Signed)
NAMEFLOYD, [name] [name]               ACCOUNT NO.:  [id number]  MEDICAL RECORD NO.:  [id number]           PATIENT TYPE:  I  LOCATION:  2039                         FACILITY:  [facility]  PHYSICIAN:  Jeoffrey Massed, MD    DATE OF BIRTH:  1941-01-15  DATE OF ADMISSION:  12/13/2010 DATE OF DISCHARGE:                        DISCHARGE SUMMARY - REFERRING   PRIMARY CARE PRACTITIONER:  Dr. Doran Durand of [facility].  PRIMARY CARDIOLOGIST:  Dr. Lance Muss of [facility].  PRIMARY NEUROLOGIST:  Dr. Porfirio Mylar Dohmeier of [facility] Neu[facility].  PRIMARY DISCHARGE DIAGNOSIS:  Chest pain along with both typical and atypical features for outpatient stress test.  SECONDARY DISCHARGE DIAGNOSES: 1. History of diastolic heart failure. 2. Hypertension. 3. Mild dementia. 4. Anxiety. 5. Gastroesophageal reflux disease. 6. Hypothyroidism. 7. Hypertension. 8. Questionable history of restrictive lung disease, on O2 at night,     now on discharge will be on O2 for 24 hours a day. 9. History of chronic dyspnea on exertion. 10.History of coronary artery disease. 11.History of questionable sleep apnea. 12.Dyslipidemia. 13.Osteoarthritis.  DISCHARGE MEDICATIONS: 1. Atrovent 0.5 mg inhaled q.6 h. via nebulizers. 2. Ambien 10 mg 1 tablet p.o. at bedtime. 3. Artificial tears 1 drop in both eyes twice daily. 4. Aspirin 325 mg 1 tablet p.o. daily. 5. Bisacodyl 5 mg 2 tablets p.o. daily p.r.n. 6. Flomax 0.4 mg 1 capsule p.o. daily. 7. Fluticasone nasal spray 50 mcg 2 sprays in each nostril daily     p.r.n. 8. Hydrochlorothiazide 12.5 mg p.o. daily. 9. Lorazepam 0.5 mg 1 tablet p.o. 3 times a day p.r.n. 10.Levothyroxine 150 mcg 1 tablet p.o. daily. 11.Melatonin 3 mg 1 tablet p.o. daily at bedtime. 12.Metoprolol 12.5 mg p.o. twice daily. 13.MiraLax 17 grams p.o. daily. 14.Oxycodone 5 mg immediate release tablets 1 tablet p.o. q.6 h.     p.r.n. 15.Protonix 40 mg 1 tablet p.o.  daily. 16.Phenergan 12.5 mg 1 tablet p.o. q.8 h. p.r.n. 17.Plavix 75 mg 1 tablet p.o. daily. 18.Calcitriol 0.5 mcg 1 tablet p.o. twice daily. 19.Tramadol 50 mg 1 tablet p.o. q.4 h. p.r.n. 20.Trazodone 100 mg 1 tablet p.o. at bedtime p.r.n. 21.Extra Strength Tylenol 500 mg 1 tablet p.o. q.6 h. p.r.n. 22.Vitamin B12 1000 units 1 tablet p.o. every evening. 23.Xanax 0.5 mg 1 tablet p.o. twice daily.  CONSULTATIONS:  Dr. Katrinka Blazing from [facility] Card[facility].  BRIEF HISTORY OF PRESENT ILLNESS:  The patient is a very pleasant 70- year-old white male who lives in assisted living facility, who was admitted yesterday for some chest tightness that was relieved by oxygen. For further details, please see the history of physical that was dictated by Dr. Orvan Falconer on admission.  PERTINENT LABORATORY DATA: 1. Cardiac enzymes were cycled, and these were negative. 2. BNP level was less than 30. 3. CBC on discharge shows WBC of 8.6, hemoglobin of 12.7, and platelet     count of 186. 4. Chemistries on discharge shows sodium of 141, potassium of 3.8,     chloride of 101, bicarb of 32, glucose of 87, BUN of 11, creatinine     of 1.5, and calcium of 8.1.  PERTINENT RADIOLOGICAL STUDIES:  Portable chest x-ray shows cardiac enlargement and left basilar atelectasis.  No significant interval change.  BRIEF HOSPITAL COURSE:  Chest tightness.  This was apparently relieved by oxygen.  The patient claims he has some sort of restrictive lung disease and gets short of breath on exertion and apparently uses O2 only at night.  In any event, as the chest pain was relieved by oxygen and because of his prior past medical history, Cardiology consultation was sought.  The patient was also admitted with the hospitalist service. Cardiac enzymes were cycled, and these were negative.  Dr. Katrinka Blazing from [facility] did see this patient and offered the patient a cardiac catheterization.  However, the patient has refused this.  He  prefers to have a nuclear stress test done, and if only positive then go on to have a cardiac cath.  The patient has been walked up and down the hallways numerous times without any chest pain.  He apparently did desaturate to 87% with ambulation, so home oxygen will be arranged for him.  [facility] will contact this patient for an outpatient stress tests.  He is to continue taking his aspirin, Plavix, and beta blocker as noted above.  He will also be provided with Atrovent nebulizers every 6 hours and also with oxygen.  For further evaluation of his exertional dyspnea, the patient will follow up with his primary care practitioner for perhaps outpatient pulmonary function tests or a pulmonary critical care consultation as an outpatient.  Rest of his chronic medical issues have been stable.  DISPOSITION:  The patient will be discharged back to his skilled nursing facility.  FOLLOWUP INSTRUCTIONS: 1. The patient will be contacted by [facility] for an outpatient     nuclear stress test.  Deboraha Sprang cardiologist's     office will call and make an appointment. 2. Use home O2 continuously at all times. 3. Total time spent 45 minutes.    Jeoffrey Massed, MD     SG/MEDQ  D:  12/14/2010  T:  12/14/2010  Job:  [id number]  cc:   Doran Durand, MD Corky Crafts, MD Melvyn Novas, M.D. Lyn Records, M.D.  Electronically Signed by Jeoffrey Massed  on 12/20/2010 03:45:25 PM

## 2010-12-27 ENCOUNTER — Encounter: Payer: Self-pay | Admitting: Pulmonary Disease

## 2010-12-27 ENCOUNTER — Other Ambulatory Visit: Payer: Self-pay | Admitting: Pulmonary Disease

## 2010-12-27 ENCOUNTER — Ambulatory Visit (INDEPENDENT_AMBULATORY_CARE_PROVIDER_SITE_OTHER): Payer: Medicare Other | Admitting: Pulmonary Disease

## 2010-12-27 DIAGNOSIS — J984 Other disorders of lung: Secondary | ICD-10-CM

## 2010-12-27 DIAGNOSIS — R918 Other nonspecific abnormal finding of lung field: Secondary | ICD-10-CM

## 2010-12-28 NOTE — Consult Note (Signed)
NAMETREYSHAUN, KEATTS NO.:  [id number]  MEDICAL RECORD NO.:  [id number]           PATIENT TYPE:  I  LOCATION:  2039                         FACILITY:  [facility]  PHYSICIAN:  Lyn Records, M.D.   DATE OF BIRTH:  09-25-1941  DATE OF CONSULTATION:  12/13/2010 DATE OF DISCHARGE:                                CONSULTATION   CONCLUSIONS: 1. Chest pain of atypical nature predominately due to inability to     obtain a coherent history from the patient.  It seems as though the     discomfort is precordial and has been progressive over the past     several months.  This precipitated by activity and is relieved by     rest on oxygen therapy. 2. Obstructive sleep apnea. 3. Abnormal prior nuclear study in 2007. 4. Hypertension. 5. Osteoarthritis. 6. Hypothyroidism. 7. Hyperparathyroidism. 8. Hyperlipidemia.  RECOMMENDATIONS: 1. Since the patient has progressive chest pain, a prior abnormal     nuclear study and gives a history that is somewhat suggestive, one     should consider coronary angiography to define the patient's     coronary anatomy and help to rule out significant CAD and     alternative approach would be to repeat a nuclear study. 2. Aspirin. 3. Serial markers. 4. N.p.o. after midnight. 5. Continue beta-blocker therapy. 6. Sublingual nitroglycerin if recurrent chest pain. 7. Supplemental oxygen.  COMMENTS:  The patient is 79 and has seen Dr. Eldridge Dace since at least 2007.  He has a history of intermittent dyspnea and chest pain.  He was then to see Dr. Porfirio Mylar Dohmeier today and was sent to the emergency room after she obtained the history of chest pain.  In speaking with the patient, the chest pain history is somewhat difficult to assess.  He states that for the last several months, he has experienced a precordial discomfort "not pain," they gets better when he stops, rest, and puts oxygen on.  The discomfort does not occur at rest.  It occurs  nearly every time he walks to the dining hall in his assisted living facility. It also occurs with more vigorous physical activity.  It does not occur at rest and is not precipitated by recumbency.  In 2007, nuclear perfusion study demonstrated a fixed inferior wall defect possibly from diaphragm attenuation and inferoapical reversibility of unclear significance that could represent flow-limiting coronary artery disease.  LV function was normal as EF of 66% and the more recent echocardiogram also demonstrated normal LV function with EF of 65%, mild left atrial enlargement, and mild mitral regurgitation. There was also mild aortic root enlargement.  The patient's medication regimen is extensive and includes: 1. Levothyroxine 200 mcg daily. 2. Plavix 75 mg daily. 3. Calcitriol 0.5 mcg twice a day. 4. Fluticasone 50 mcg two sprays twice a day. 5. Melatonin 3 mg daily. 6. Simvastatin 20 mg daily. 7. Tramadol 50 mg every 6 hours as needed for headache. 8. Aspirin 325 mg per day. 9. Polyethylene glycol powder once a day. 10.Alprazolam 0.25 mg b.i.d. 11.Ambien 10 mg at bedtime daily. 12.Bisacodyl 5 mg daily. 13.Promethazine  12.5 mg three times a day. 14.Oxycodone 5 mg/5 mL every 6 hours. 15.Hydrochlorothiazide 12.5 mg per day. 16.Metoprolol 25 mg one half tablet twice daily. 17.One to six liters of oxygen. 18.Pantoprazole 40 mg per day.  PAST SURGICAL HISTORY:  Cataracts bilaterally, laparoscopic cholecystectomy, thyroidectomy, right total knee and left total knee.  FAMILY HISTORY:  Father died of myocardial infarction at age 84.  Mother died of gastric cancer.  Brother died of colon cancer.  Sister died of gastric cancer.  Family members have Cowden syndrome.  PHYSICAL EXAMINATION:  GENERAL:  The patient is in no acute distress. VITAL SIGNS:  His blood pressure is 140/60.  The heart rate is 70.  He appears to be in no acute distress. HEENT:  He has poor dentition. NECK:  Neck  veins are not distended with the patient sitting at 90 degrees.  No carotid bruits are heard. LUNGS:  Clear. CARDIAC:  Reveals no murmur, rub, gallop or click. ABDOMEN:  Soft, nontender. EXTREMITIES:  Reveal no edema.  Radial pulses 2+ bilaterally.  There are chronic stasis changes are noted in the lower extremities with trace edema. NEUROLOGIC:  Nonfocal.  The patient has difficulty staying on point during historical questioning.  Laboratory data reveals a WBC of 8,5000, hemoglobin of 12.9, platelet count 190,000.  Potassium 4.1, creatinine 1.15.  Troponin 0.05.  Chest x- ray; cardiac enlargement, left basal atelectasis.  No significant interval change compared to December 02, 2010.  CT scan performed on December 03, 2010, demonstrated cardiomegaly and coronary artery calcification.  DISCUSSION:  The patient has coronary artery disease.  We need to determine if his current symptoms represent myocardial ischemia.  He has had a previous abnormal nuclear study.  Angiography was seemed to be a reasonable.     Lyn Records, M.D.     HWS/MEDQ  D:  12/13/2010  T:  12/14/2010  Job:  [id number]  cc:   Dr. Donita Brooks, MD Melvyn Novas, M.D.  Electronically Signed by Verdis Prime M.D. on 12/27/2010 86:57:84 AM

## 2010-12-31 LAB — URINALYSIS, ROUTINE W REFLEX MICROSCOPIC
Bilirubin Urine: NEGATIVE
Glucose, UA: NEGATIVE mg/dL
Hgb urine dipstick: NEGATIVE
Protein, ur: NEGATIVE mg/dL
pH: 7.5 (ref 5.0–8.0)

## 2010-12-31 LAB — URINE CULTURE
Colony Count: NO GROWTH
Culture  Setup Time: 201112080249
Culture: NO GROWTH

## 2010-12-31 LAB — CBC
MCV: 96.8 fL (ref 78.0–100.0)
Platelets: 176 10*3/uL (ref 150–400)
RBC: 4.07 MIL/uL — ABNORMAL LOW (ref 4.22–5.81)
RDW: 13.1 % (ref 11.5–15.5)
WBC: 9.6 10*3/uL (ref 4.0–10.5)

## 2010-12-31 LAB — POCT I-STAT, CHEM 8
BUN: 23 mg/dL (ref 6–23)
Creatinine, Ser: 1.2 mg/dL (ref 0.4–1.5)
Hemoglobin: 13.6 g/dL (ref 13.0–17.0)
Potassium: 3.7 mEq/L (ref 3.5–5.1)
Sodium: 141 mEq/L (ref 135–145)
TCO2: 33 mmol/L (ref 0–100)

## 2010-12-31 LAB — DIFFERENTIAL
Basophils Absolute: 0 10*3/uL (ref 0.0–0.1)
Eosinophils Relative: 2 % (ref 0–5)
Lymphocytes Relative: 14 % (ref 12–46)
Lymphs Abs: 1.4 10*3/uL (ref 0.7–4.0)
Neutrophils Relative %: 76 % (ref 43–77)

## 2011-01-03 LAB — URINE MICROSCOPIC-ADD ON

## 2011-01-03 LAB — BASIC METABOLIC PANEL
BUN: 17 mg/dL (ref 6–23)
CO2: 32 mEq/L (ref 19–32)
Calcium: 8 mg/dL — ABNORMAL LOW (ref 8.4–10.5)
Chloride: 101 mEq/L (ref 96–112)
Chloride: 104 mEq/L (ref 96–112)
Creatinine, Ser: 1.23 mg/dL (ref 0.4–1.5)
Creatinine, Ser: 1.35 mg/dL (ref 0.4–1.5)
GFR calc Af Amer: >60 mL/min (ref >60)
GFR calc Af Amer: >60 mL/min (ref >60)
GFR calc Af Amer: >60 mL/min (ref >60)
GFR calc non Af Amer: >60 mL/min (ref >60)
Potassium: 3.8 mEq/L (ref 3.5–5.1)
Potassium: 4.1 mEq/L (ref 3.5–5.1)
Sodium: 139 mEq/L (ref 135–145)
Sodium: 143 mEq/L (ref 135–145)

## 2011-01-03 LAB — CBC
HCT: 37.7 % — ABNORMAL LOW (ref 39.0–52.0)
Hemoglobin: 12.4 g/dL — ABNORMAL LOW (ref 13.0–17.0)
MCH: 31.1 pg (ref 26.0–34.0)
MCHC: 32.8 g/dL (ref 30.0–36.0)
MCV: 95.2 fL (ref 78.0–100.0)
Platelets: 201 10*3/uL (ref 150–400)
Platelets: 202 10*3/uL (ref 150–400)
RBC: 3.83 MIL/uL — ABNORMAL LOW (ref 4.22–5.81)
RBC: 3.96 MIL/uL — ABNORMAL LOW (ref 4.22–5.81)
RDW: 14 % (ref 11.5–15.5)
WBC: 7.5 10*3/uL (ref 4.0–10.5)

## 2011-01-03 LAB — TSH: TSH: 1.044 u[IU]/mL (ref 0.350–4.500)

## 2011-01-03 LAB — CARDIAC PANEL(CRET KIN+CKTOT+MB+TROPI)
CK, MB: 1.2 ng/mL (ref 0.3–4.0)
CK, MB: 1.6 ng/mL (ref 0.3–4.0)
Total CK: 42 U/L (ref 7–232)
Total CK: 42 U/L (ref 7–232)
Troponin I: 0.01 ng/mL (ref 0.00–0.06)
Troponin I: 0.01 ng/mL (ref 0.00–0.06)
Troponin I: 0.02 ng/mL (ref 0.00–0.06)

## 2011-01-03 LAB — BRAIN NATRIURETIC PEPTIDE: Pro B Natriuretic peptide (BNP): <30 pg/mL (ref 0.0–100.0)

## 2011-01-03 LAB — GLUCOSE, CAPILLARY: Glucose-Capillary: 102 mg/dL — ABNORMAL HIGH (ref 70–99)

## 2011-01-03 LAB — URINALYSIS, ROUTINE W REFLEX MICROSCOPIC
Bilirubin Urine: NEGATIVE
Glucose, UA: NEGATIVE mg/dL
Hgb urine dipstick: NEGATIVE
Ketones, ur: 15 mg/dL — AB
Nitrite: NEGATIVE
pH: 8 (ref 5.0–8.0)

## 2011-01-03 LAB — POCT CARDIAC MARKERS: Troponin i, poc: <0.05 ng/mL (ref 0.00–0.09)

## 2011-01-03 LAB — APTT: aPTT: 29 seconds (ref 24–37)

## 2011-01-03 LAB — DIFFERENTIAL
Lymphocytes Relative: 24 % (ref 12–46)
Lymphs Abs: 1.8 10*3/uL (ref 0.7–4.0)
Monocytes Relative: 7 % (ref 3–12)
Neutro Abs: 5 10*3/uL (ref 1.7–7.7)
Neutrophils Relative %: 66 % (ref 43–77)

## 2011-01-03 LAB — MRSA PCR SCREENING: MRSA by PCR: NEGATIVE

## 2011-01-03 LAB — URINE CULTURE

## 2011-01-08 NOTE — Assessment & Plan Note (Signed)
Summary: evaluation for abnormal ct chest    Copy to:  [facility] Primary Provider/Referring Provider:  Dr Allena Katz  CC:  Follow up appt.  Pt states he recently had a CT scan done in Feb 2012 because pt states he is still having sob with exertion.   Pt does state he get recent nose bleeds. .  History of Present Illness: the pt is a 70y/o male, well known to me with chronic multifactorial doe, who is referred back for evaluation of abnormal ct chest.  He recently had a ct 12/03/10 for sob, and was noted to have small nodular lesions in RLL, as well as a platelike abnormal density sitting on top of the right HD.  The pt had a ct chest in 2009 where the RLL nodules were noted, and they have not changed since that time.  The density along the right HD was also present in 2009, but did appear a little smaller.  The pt denies any major cough or hemoptysis.  He denies significant recent weight loss.  Current Medications (verified): 1)  Synthroid 150 Mcg Tabs (Levothyroxine Sodium) .... Take 1 Tablet By Mouth Once A Day 2)  Plavix 75 Mg  Tabs (Clopidogrel Bisulfate) .... Take One Tab By Mouth Once Daily 3)  Miralax   Powd (Polyethylene Glycol 3350) .... Use As Needed 4)  Flomax 0.4 Mg Xr24h-Cap (Tamsulosin Hcl) .... Take 1 Tablet By Mouth Once A Day 5)  Hydrochlorothiazide 12.5 Mg Caps (Hydrochlorothiazide) .... Take 1 Tablet By Mouth Once A Day 6)  Rocaltrol 0.5 Mcg Caps (Calcitriol) .Marland Kitchen.. 1 Tab Two Times A Day 7)  Pantoprazole Sodium 40 Mg Tbec (Pantoprazole Sodium) .... One Tab Once Daily 8)  Bayer Aspirin 325 Mg Tabs (Aspirin) .... Take 1 Tablet By Mouth Once A Day 9)  Lorazepam 0.5 Mg Tabs (Lorazepam) .... 1/2 Tab Three Times A Day As Needed 10)  Melatonin 3 Mg Tabs (Melatonin) .... Once Daily 11)  Dulcolax 5 Mg Tbec (Bisacodyl) .... Take 2 Tab As Needed 12)  Tramadol Hcl 50 Mg Tabs (Tramadol Hcl) .Marland Kitchen.. 1 Tab By Mouth Every 4 Hours As Needed 13)  Flonase 50 Mcg/act  Susp (Fluticasone Propionate)  .... Two Puffs Each Nostril Daily 14)  Zolpidem Tartrate 10 Mg Tabs (Zolpidem Tartrate) .... Take 1 Tablet By Mouth Once A Day 15)  Xanax 0.5 Mg Tabs (Alprazolam) .... Take 1 Tablet By Mouth Two Times A Day 16)  Metoprolol Tartrate 25 Mg Tabs (Metoprolol Tartrate) .... Take 1/2 Tab By Mouth Two Times A Day 17)  Tylenol Extra Strength 500 Mg Tabs (Acetaminophen) .... As Needed 18)  Oxycodone Hcl 5 Mg Tabs (Oxycodone Hcl) .... As Needed 19)  Promethazine Hcl 12.5 Mg Tabs (Promethazine Hcl) .... As Needed 20)  Trazodone Hcl 100 Mg Tabs (Trazodone Hcl) .... As Needed 21)  Refresh Tears 0.5 % Soln (Carboxymethylcellulose Sodium) .Marland Kitchen.. 1 Drop in Each Eye Two Times A Day 22)  Atrovent 0.2mg /ml .... Inhale Every 6 Hours As Needed 23)  Oxygen .... 2 Lpm  Allergies (verified): No Known Drug Allergies  Past History:  Past medical, surgical, family and social histories (including risk factors) reviewed, and no changes noted (except as noted below).  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 Colon Cancer Coronary Heart Disease MI/Heart  Attack  Social History: Reviewed history from 03/31/2008 and no changes required. Patient never smoked.  widowed. twice retired from security work.  Review of Systems       The patient complains of shortness of breath with activity, chest pain, weight change, tooth/dental problems, sneezing, and itching.  The patient denies shortness of breath at rest, productive cough, non-productive cough, coughing up blood, irregular heartbeats, acid heartburn, indigestion, loss of appetite, abdominal pain, difficulty swallowing, sore  throat, headaches, nasal congestion/difficulty breathing through nose, ear ache, anxiety, depression, hand/feet swelling, joint stiffness or pain, rash, change in color of mucus, and fever.    Vital Signs:  Patient profile:   70 year old male Height:      74 inches Weight:      278.50 pounds BMI:     35.89 O2 Sat:      96 % on 1.5 LPM pulsed Temp:     97.5 degrees F oral Pulse rate:   72 / minute BP sitting:   114 / 82  (left arm) Cuff size:   large  Vitals Entered By: Arman Filter LPN (December 26, 1912 9:00 AM)  O2 Flow:  1.5 LPM pulsed CC: Follow up appt.  Pt states he recently had a CT scan done in Feb 2012 because pt states he is still having sob with exertion.   Pt does state he get recent nose bleeds.  Comments Medications reviewed with patient Arman Filter LPN  December 27, 7827 9:02 AM    Physical Exam  General:  wd male in nad  Nose:  no purulence or discharge noted.  Lungs:  minimal basilar crackles bilat, no wheezing  Heart:  rrr Extremities:  +edema , no cyanosis  Neurologic:  alert and oriented, moves all 4.    Impression & Recommendations:  Problem # 1:  PULMONARY NODULE (ICD-518.89) the pt has nodular changes in RLL that are without change from 2009 ct chest.  His density along the right HD is a little larger however.  This primarily appears "discoid" in nature, which would make it more likely subpleural scarring or atelectasis.  However, the pt understands that I cannot exclude a malignant process.  The location of this lesion is very problematic with its proximity to the diaphragm.  It would be very difficult to bx with TTNA or ENB.  I have discussed with the pt the various options.Marland KitchenMarland KitchenPET scanning, bx, surveillance.  We have decided to do a f/u ct in 4mos to look for change.    Medications Added to Medication List This Visit: 1)  Synthroid 150 Mcg Tabs (Levothyroxine sodium) .... Take 1 tablet by mouth once a day 2)  Bayer Aspirin 325 Mg Tabs (Aspirin) .... Take  1 tablet by mouth once a day 3)  Lorazepam 0.5 Mg Tabs (Lorazepam) .... 1/2 tab three times a day as needed 4)  Flonase 50 Mcg/act Susp (Fluticasone propionate) .... Two puffs each nostril daily 5)  Zolpidem Tartrate 10 Mg Tabs (Zolpidem tartrate) .... Take 1 tablet by mouth once a day 6)  Xanax 0.5 Mg Tabs (Alprazolam) .... Take 1 tablet by mouth two times a day 7)  Metoprolol Tartrate 25 Mg Tabs (Metoprolol tartrate) .... Take 1/2 tab by mouth two times a day 8)  Tylenol Extra Strength 500 Mg Tabs (Acetaminophen) .... As needed 9)  Oxycodone Hcl 5 Mg Tabs (Oxycodone hcl) .... As needed 10)  Promethazine Hcl 12.5 Mg Tabs (Promethazine hcl) .... As needed 11)  Trazodone Hcl  100 Mg Tabs (Trazodone hcl) .... As needed 12)  Refresh Tears 0.5 % Soln (Carboxymethylcellulose sodium) .Marland Kitchen.. 1 drop in each eye two times a day 13)  Atrovent 0.2mg /ml  .... Inhale every 6 hours as needed 14)  Oxygen  .... 2 lpm  Other Orders: Est. Patient Level IV (16109) Radiology Referral (Radiology)  Patient Instructions: 1)  will do followup scan in 4mos 2)  will call you with the results when available.

## 2011-01-09 LAB — POCT I-STAT 3, ART BLOOD GAS (G3+)
Acid-Base Excess: 1 mmol/L (ref 0.0–2.0)
Acid-Base Excess: 4 mmol/L — ABNORMAL HIGH (ref 0.0–2.0)
Acid-Base Excess: 8 mmol/L — ABNORMAL HIGH (ref 0.0–2.0)
Acid-base deficit: 1 mmol/L (ref 0.0–2.0)
Bicarbonate: 23.6 mEq/L (ref 20.0–24.0)
Bicarbonate: 24.6 mEq/L — ABNORMAL HIGH (ref 20.0–24.0)
Bicarbonate: 28.4 mEq/L — ABNORMAL HIGH (ref 20.0–24.0)
Bicarbonate: 30.8 mEq/L — ABNORMAL HIGH (ref 20.0–24.0)
O2 Saturation: 85 %
O2 Saturation: 99 %
Patient temperature: 98.2
Patient temperature: 98.6
TCO2: 25 mmol/L (ref 0–100)
TCO2: 26 mmol/L (ref 0–100)
TCO2: 33 mmol/L (ref 0–100)
pCO2 arterial: 28.2 mmHg — ABNORMAL LOW (ref 35.0–45.0)
pCO2 arterial: 53.6 mmHg — ABNORMAL HIGH (ref 35.0–45.0)
pCO2 arterial: 57 mmHg — ABNORMAL HIGH (ref 35.0–45.0)
pH, Arterial: 7.252 — ABNORMAL LOW (ref 7.350–7.450)
pH, Arterial: 7.328 — ABNORMAL LOW (ref 7.350–7.450)
pH, Arterial: 7.545 — ABNORMAL HIGH (ref 7.350–7.450)
pO2, Arterial: 56 mmHg — ABNORMAL LOW (ref 80.0–100.0)
pO2, Arterial: 75 mmHg — ABNORMAL LOW (ref 80.0–100.0)
pO2, Arterial: 98 mmHg (ref 80.0–100.0)

## 2011-01-09 LAB — BASIC METABOLIC PANEL
BUN: 11 mg/dL (ref 6–23)
BUN: 15 mg/dL (ref 6–23)
BUN: 15 mg/dL (ref 6–23)
BUN: 20 mg/dL (ref 6–23)
BUN: 56 mg/dL — ABNORMAL HIGH (ref 6–23)
CO2: 23 mEq/L (ref 19–32)
CO2: 26 mEq/L (ref 19–32)
CO2: 28 mEq/L (ref 19–32)
CO2: 34 mEq/L — ABNORMAL HIGH (ref 19–32)
Calcium: 5.1 mg/dL — CL (ref 8.4–10.5)
Calcium: 6.6 mg/dL — ABNORMAL LOW (ref 8.4–10.5)
Calcium: 6.6 mg/dL — ABNORMAL LOW (ref 8.4–10.5)
Calcium: 6.8 mg/dL — ABNORMAL LOW (ref 8.4–10.5)
Calcium: 6.8 mg/dL — ABNORMAL LOW (ref 8.4–10.5)
Calcium: 6.9 mg/dL — ABNORMAL LOW (ref 8.4–10.5)
Chloride: 109 mEq/L (ref 96–112)
Chloride: 98 mEq/L (ref 96–112)
Creatinine, Ser: 1.07 mg/dL (ref 0.4–1.5)
Creatinine, Ser: 1.12 mg/dL (ref 0.4–1.5)
Creatinine, Ser: 1.13 mg/dL (ref 0.4–1.5)
Creatinine, Ser: 1.51 mg/dL — ABNORMAL HIGH (ref 0.4–1.5)
Creatinine, Ser: 2.45 mg/dL — ABNORMAL HIGH (ref 0.4–1.5)
GFR calc Af Amer: 40 mL/min — ABNORMAL LOW (ref >60)
GFR calc Af Amer: 48 mL/min — ABNORMAL LOW (ref >60)
GFR calc Af Amer: >60 mL/min (ref >60)
GFR calc non Af Amer: 26 mL/min — ABNORMAL LOW (ref >60)
GFR calc non Af Amer: 33 mL/min — ABNORMAL LOW (ref >60)
GFR calc non Af Amer: 51 mL/min — ABNORMAL LOW (ref >60)
GFR calc non Af Amer: >60 mL/min (ref >60)
GFR calc non Af Amer: >60 mL/min (ref >60)
Glucose, Bld: 110 mg/dL — ABNORMAL HIGH (ref 70–99)
Glucose, Bld: 163 mg/dL — ABNORMAL HIGH (ref 70–99)
Glucose, Bld: 77 mg/dL (ref 70–99)
Glucose, Bld: 81 mg/dL (ref 70–99)
Glucose, Bld: 86 mg/dL (ref 70–99)
Potassium: 3.5 mEq/L (ref 3.5–5.1)
Potassium: 3.8 mEq/L (ref 3.5–5.1)
Potassium: 5.3 mEq/L — ABNORMAL HIGH (ref 3.5–5.1)
Sodium: 143 mEq/L (ref 135–145)
Sodium: 144 mEq/L (ref 135–145)
Sodium: 146 mEq/L — ABNORMAL HIGH (ref 135–145)
Sodium: 147 mEq/L — ABNORMAL HIGH (ref 135–145)

## 2011-01-09 LAB — CBC
HCT: 29.2 % — ABNORMAL LOW (ref 39.0–52.0)
HCT: 30.1 % — ABNORMAL LOW (ref 39.0–52.0)
HCT: 30.3 % — ABNORMAL LOW (ref 39.0–52.0)
HCT: 30.4 % — ABNORMAL LOW (ref 39.0–52.0)
HCT: 32.1 % — ABNORMAL LOW (ref 39.0–52.0)
HCT: 32.9 % — ABNORMAL LOW (ref 39.0–52.0)
Hemoglobin: 10.3 g/dL — ABNORMAL LOW (ref 13.0–17.0)
Hemoglobin: 11.3 g/dL — ABNORMAL LOW (ref 13.0–17.0)
Hemoglobin: 9.6 g/dL — ABNORMAL LOW (ref 13.0–17.0)
Hemoglobin: 9.9 g/dL — ABNORMAL LOW (ref 13.0–17.0)
Hemoglobin: 9.9 g/dL — ABNORMAL LOW (ref 13.0–17.0)
MCHC: 33.4 g/dL (ref 30.0–36.0)
MCHC: 33.8 g/dL (ref 30.0–36.0)
MCHC: 33.8 g/dL (ref 30.0–36.0)
MCHC: 34.2 g/dL (ref 30.0–36.0)
MCHC: 34.5 g/dL (ref 30.0–36.0)
MCHC: 34.5 g/dL (ref 30.0–36.0)
MCHC: 35.1 g/dL (ref 30.0–36.0)
MCV: 97.6 fL (ref 78.0–100.0)
MCV: 98.4 fL (ref 78.0–100.0)
MCV: 98.5 fL (ref 78.0–100.0)
MCV: 98.6 fL (ref 78.0–100.0)
MCV: 99.3 fL (ref 78.0–100.0)
Platelets: 179 10*3/uL (ref 150–400)
Platelets: 188 10*3/uL (ref 150–400)
Platelets: 228 10*3/uL (ref 150–400)
Platelets: 232 10*3/uL (ref 150–400)
Platelets: 296 10*3/uL (ref 150–400)
Platelets: 433 10*3/uL — ABNORMAL HIGH (ref 150–400)
RBC: 2.88 MIL/uL — ABNORMAL LOW (ref 4.22–5.81)
RBC: 2.97 MIL/uL — ABNORMAL LOW (ref 4.22–5.81)
RBC: 3.08 MIL/uL — ABNORMAL LOW (ref 4.22–5.81)
RBC: 3.08 MIL/uL — ABNORMAL LOW (ref 4.22–5.81)
RBC: 3.26 MIL/uL — ABNORMAL LOW (ref 4.22–5.81)
RBC: 3.31 MIL/uL — ABNORMAL LOW (ref 4.22–5.81)
RDW: 13.8 % (ref 11.5–15.5)
RDW: 13.8 % (ref 11.5–15.5)
RDW: 14 % (ref 11.5–15.5)
RDW: 14.1 % (ref 11.5–15.5)
RDW: 14.2 % (ref 11.5–15.5)
RDW: 14.3 % (ref 11.5–15.5)
WBC: 10.6 10*3/uL — ABNORMAL HIGH (ref 4.0–10.5)
WBC: 11.5 10*3/uL — ABNORMAL HIGH (ref 4.0–10.5)
WBC: 8.3 10*3/uL (ref 4.0–10.5)
WBC: 8.9 K/uL (ref 4.0–10.5)

## 2011-01-09 LAB — DIFFERENTIAL
Basophils Absolute: 0 10*3/uL (ref 0.0–0.1)
Basophils Absolute: 0 10*3/uL (ref 0.0–0.1)
Basophils Relative: 0 % (ref 0–1)
Basophils Relative: 0 % (ref 0–1)
Basophils Relative: 0 % (ref 0–1)
Eosinophils Absolute: 0.3 10*3/uL (ref 0.0–0.7)
Eosinophils Absolute: 0.4 10*3/uL (ref 0.0–0.7)
Eosinophils Relative: 2 % (ref 0–5)
Eosinophils Relative: 3 % (ref 0–5)
Lymphocytes Relative: 10 % — ABNORMAL LOW (ref 12–46)
Lymphocytes Relative: 8 % — ABNORMAL LOW (ref 12–46)
Lymphs Abs: 0.9 10*3/uL (ref 0.7–4.0)
Lymphs Abs: 1.1 10*3/uL (ref 0.7–4.0)
Lymphs Abs: 1.1 10*3/uL (ref 0.7–4.0)
Monocytes Absolute: 0.6 10*3/uL (ref 0.1–1.0)
Monocytes Absolute: 0.7 10*3/uL (ref 0.1–1.0)
Monocytes Relative: 6 % (ref 3–12)
Monocytes Relative: 9 % (ref 3–12)
Neutro Abs: 8.2 10*3/uL — ABNORMAL HIGH (ref 1.7–7.7)
Neutro Abs: 8.3 10*3/uL — ABNORMAL HIGH (ref 1.7–7.7)
Neutro Abs: 9.5 10*3/uL — ABNORMAL HIGH (ref 1.7–7.7)
Neutrophils Relative %: 78 % — ABNORMAL HIGH (ref 43–77)
Neutrophils Relative %: 79 % — ABNORMAL HIGH (ref 43–77)
Neutrophils Relative %: 80 % — ABNORMAL HIGH (ref 43–77)

## 2011-01-09 LAB — CULTURE, BLOOD (ROUTINE X 2)
Culture: NO GROWTH
Culture: NO GROWTH

## 2011-01-09 LAB — GLUCOSE, CAPILLARY
Glucose-Capillary: 109 mg/dL — ABNORMAL HIGH (ref 70–99)
Glucose-Capillary: 112 mg/dL — ABNORMAL HIGH (ref 70–99)
Glucose-Capillary: 116 mg/dL — ABNORMAL HIGH (ref 70–99)
Glucose-Capillary: 117 mg/dL — ABNORMAL HIGH (ref 70–99)
Glucose-Capillary: 121 mg/dL — ABNORMAL HIGH (ref 70–99)
Glucose-Capillary: 122 mg/dL — ABNORMAL HIGH (ref 70–99)
Glucose-Capillary: 128 mg/dL — ABNORMAL HIGH (ref 70–99)
Glucose-Capillary: 148 mg/dL — ABNORMAL HIGH (ref 70–99)
Glucose-Capillary: 185 mg/dL — ABNORMAL HIGH (ref 70–99)
Glucose-Capillary: 61 mg/dL — ABNORMAL LOW (ref 70–99)
Glucose-Capillary: 61 mg/dL — ABNORMAL LOW (ref 70–99)
Glucose-Capillary: 75 mg/dL (ref 70–99)
Glucose-Capillary: 79 mg/dL (ref 70–99)
Glucose-Capillary: 86 mg/dL (ref 70–99)
Glucose-Capillary: 87 mg/dL (ref 70–99)
Glucose-Capillary: 89 mg/dL (ref 70–99)
Glucose-Capillary: 90 mg/dL (ref 70–99)
Glucose-Capillary: 92 mg/dL (ref 70–99)
Glucose-Capillary: 94 mg/dL (ref 70–99)

## 2011-01-09 LAB — URINALYSIS, ROUTINE W REFLEX MICROSCOPIC
Glucose, UA: NEGATIVE mg/dL
Hgb urine dipstick: NEGATIVE
Protein, ur: NEGATIVE mg/dL
Specific Gravity, Urine: 1.016 (ref 1.005–1.030)
Urobilinogen, UA: 0.2 mg/dL (ref 0.0–1.0)

## 2011-01-09 LAB — PHOSPHORUS
Phosphorus: 3.5 mg/dL (ref 2.3–4.6)
Phosphorus: 4.7 mg/dL — ABNORMAL HIGH (ref 2.3–4.6)
Phosphorus: 5 mg/dL — ABNORMAL HIGH (ref 2.3–4.6)

## 2011-01-09 LAB — COMPREHENSIVE METABOLIC PANEL
ALT: 11 U/L (ref 0–53)
AST: 23 U/L (ref 0–37)
Albumin: 2.2 g/dL — ABNORMAL LOW (ref 3.5–5.2)
BUN: 50 mg/dL — ABNORMAL HIGH (ref 6–23)
BUN: 83 mg/dL — ABNORMAL HIGH (ref 6–23)
CO2: 36 mEq/L — ABNORMAL HIGH (ref 19–32)
Calcium: 6.6 mg/dL — ABNORMAL LOW (ref 8.4–10.5)
Calcium: 8.4 mg/dL (ref 8.4–10.5)
Creatinine, Ser: 2.38 mg/dL — ABNORMAL HIGH (ref 0.4–1.5)
GFR calc Af Amer: 33 mL/min — ABNORMAL LOW (ref >60)
GFR calc non Af Amer: 17 mL/min — ABNORMAL LOW (ref >60)
GFR calc non Af Amer: 27 mL/min — ABNORMAL LOW (ref >60)
Glucose, Bld: 114 mg/dL — ABNORMAL HIGH (ref 70–99)
Total Protein: 6.5 g/dL (ref 6.0–8.3)

## 2011-01-09 LAB — TSH
TSH: 0.554 u[IU]/mL (ref 0.350–4.500)
TSH: 5.715 u[IU]/mL — ABNORMAL HIGH (ref 0.350–4.500)

## 2011-01-09 LAB — CULTURE, BAL-QUANTITATIVE W GRAM STAIN

## 2011-01-09 LAB — RETICULOCYTES
RBC.: 3.03 MIL/uL — ABNORMAL LOW (ref 4.22–5.81)
Retic Count, Absolute: 42.4 10*3/uL (ref 19.0–186.0)

## 2011-01-09 LAB — IRON AND TIBC
Iron: 29 ug/dL — ABNORMAL LOW (ref 42–135)
Saturation Ratios: 16 % — ABNORMAL LOW (ref 20–55)
TIBC: 176 ug/dL — ABNORMAL LOW (ref 215–435)
UIBC: 147 ug/dL

## 2011-01-09 LAB — BRAIN NATRIURETIC PEPTIDE: Pro B Natriuretic peptide (BNP): 44 pg/mL (ref 0.0–100.0)

## 2011-01-09 LAB — CARDIAC PANEL(CRET KIN+CKTOT+MB+TROPI)
CK, MB: 1.7 ng/mL (ref 0.3–4.0)
CK, MB: 2 ng/mL (ref 0.3–4.0)
CK, MB: 3.1 ng/mL (ref 0.3–4.0)
Relative Index: 0.6 (ref 0.0–2.5)
Total CK: 189 U/L (ref 7–232)
Total CK: 299 U/L — ABNORMAL HIGH (ref 7–232)
Troponin I: 0.01 ng/mL (ref 0.00–0.06)

## 2011-01-09 LAB — APTT: aPTT: 37 seconds (ref 24–37)

## 2011-01-09 LAB — CK TOTAL AND CKMB (NOT AT ARMC)
CK, MB: 2.6 ng/mL (ref 0.3–4.0)
Relative Index: 1.1 (ref 0.0–2.5)

## 2011-01-09 LAB — URINE CULTURE

## 2011-01-09 LAB — MAGNESIUM: Magnesium: 2 mg/dL (ref 1.5–2.5)

## 2011-01-09 LAB — PROTIME-INR
INR: 1.08 (ref 0.00–1.49)
Prothrombin Time: 13.9 seconds (ref 11.6–15.2)

## 2011-01-09 LAB — LIPID PANEL
LDL Cholesterol: 65 mg/dL (ref 0–99)
Triglycerides: 165 mg/dL — ABNORMAL HIGH (ref <150)
VLDL: 33 mg/dL (ref 0–40)

## 2011-01-09 LAB — BASIC METABOLIC PANEL WITH GFR
CO2: 28 meq/L (ref 19–32)
Chloride: 106 meq/L (ref 96–112)
GFR calc Af Amer: 32 mL/min — ABNORMAL LOW (ref >60)
Potassium: 4.8 meq/L (ref 3.5–5.1)

## 2011-01-09 LAB — VANCOMYCIN, TROUGH: Vancomycin Tr: 16.1 ug/mL (ref 10.0–20.0)

## 2011-01-09 LAB — TROPONIN I: Troponin I: 0.01 ng/mL (ref 0.00–0.06)

## 2011-01-09 LAB — OSMOLALITY: Osmolality: 335 mOsm/kg — ABNORMAL HIGH (ref 275–300)

## 2011-01-13 LAB — PREALBUMIN: Prealbumin: 27.2 mg/dL (ref 18.0–45.0)

## 2011-01-13 LAB — COMPREHENSIVE METABOLIC PANEL
ALT: 29 U/L (ref 0–53)
AST: 39 U/L — ABNORMAL HIGH (ref 0–37)
Albumin: 2.7 g/dL — ABNORMAL LOW (ref 3.5–5.2)
Chloride: 97 mEq/L (ref 96–112)
Creatinine, Ser: 1.24 mg/dL (ref 0.4–1.5)
GFR calc Af Amer: >60 mL/min (ref >60)
Sodium: 138 mEq/L (ref 135–145)
Total Bilirubin: 0.4 mg/dL (ref 0.3–1.2)

## 2011-01-13 LAB — MAGNESIUM
Magnesium: 1.9 mg/dL (ref 1.5–2.5)
Magnesium: 1.9 mg/dL (ref 1.5–2.5)
Magnesium: 2.1 mg/dL (ref 1.5–2.5)
Magnesium: 2.1 mg/dL (ref 1.5–2.5)

## 2011-01-13 LAB — DIFFERENTIAL
Basophils Absolute: 0 10*3/uL (ref 0.0–0.1)
Basophils Relative: 1 % (ref 0–1)
Basophils Relative: 1 % (ref 0–1)
Eosinophils Absolute: 0.2 10*3/uL (ref 0.0–0.7)
Eosinophils Absolute: 0.3 10*3/uL (ref 0.0–0.7)
Eosinophils Relative: 3 % (ref 0–5)
Lymphocytes Relative: 16 % (ref 12–46)
Lymphs Abs: 1.1 10*3/uL (ref 0.7–4.0)
Monocytes Absolute: 0.4 10*3/uL (ref 0.1–1.0)
Monocytes Relative: 6 % (ref 3–12)
Neutro Abs: 5.2 10*3/uL (ref 1.7–7.7)
Neutrophils Relative %: 72 % (ref 43–77)

## 2011-01-13 LAB — BASIC METABOLIC PANEL
BUN: 10 mg/dL (ref 6–23)
BUN: 13 mg/dL (ref 6–23)
BUN: 14 mg/dL (ref 6–23)
CO2: 37 mEq/L — ABNORMAL HIGH (ref 19–32)
Calcium: 6.3 mg/dL — CL (ref 8.4–10.5)
Calcium: 7.1 mg/dL — ABNORMAL LOW (ref 8.4–10.5)
Calcium: 7.3 mg/dL — ABNORMAL LOW (ref 8.4–10.5)
Chloride: 96 mEq/L (ref 96–112)
Creatinine, Ser: 1.27 mg/dL (ref 0.4–1.5)
Creatinine, Ser: 1.31 mg/dL (ref 0.4–1.5)
Creatinine, Ser: 1.46 mg/dL (ref 0.4–1.5)
GFR calc Af Amer: >60 mL/min (ref >60)
GFR calc non Af Amer: 48 mL/min — ABNORMAL LOW (ref >60)
GFR calc non Af Amer: 56 mL/min — ABNORMAL LOW (ref >60)
Glucose, Bld: 108 mg/dL — ABNORMAL HIGH (ref 70–99)
Glucose, Bld: 87 mg/dL (ref 70–99)
Glucose, Bld: 92 mg/dL (ref 70–99)
Potassium: 4 mEq/L (ref 3.5–5.1)
Potassium: 4.6 mEq/L (ref 3.5–5.1)

## 2011-01-13 LAB — CBC
MCHC: 33.4 g/dL (ref 30.0–36.0)
MCV: 98.3 fL (ref 78.0–100.0)
MCV: 99.5 fL (ref 78.0–100.0)
Platelets: 237 10*3/uL (ref 150–400)
Platelets: 347 10*3/uL (ref 150–400)
RBC: 3.2 MIL/uL — ABNORMAL LOW (ref 4.22–5.81)
RDW: 14.9 % (ref 11.5–15.5)
WBC: 7.1 10*3/uL (ref 4.0–10.5)

## 2011-01-13 LAB — PHOSPHORUS: Phosphorus: 5.2 mg/dL — ABNORMAL HIGH (ref 2.3–4.6)

## 2011-01-26 LAB — COMPREHENSIVE METABOLIC PANEL
ALT: 12 U/L (ref 0–53)
ALT: 9 U/L (ref 0–53)
AST: 24 U/L (ref 0–37)
Albumin: 3 g/dL — ABNORMAL LOW (ref 3.5–5.2)
Albumin: 3.1 g/dL — ABNORMAL LOW (ref 3.5–5.2)
Alkaline Phosphatase: 71 U/L (ref 39–117)
Alkaline Phosphatase: 71 U/L (ref 39–117)
BUN: 32 mg/dL — ABNORMAL HIGH (ref 6–23)
CO2: 36 mEq/L — ABNORMAL HIGH (ref 19–32)
Calcium: 8 mg/dL — ABNORMAL LOW (ref 8.4–10.5)
Calcium: 8.8 mg/dL (ref 8.4–10.5)
Chloride: 95 mEq/L — ABNORMAL LOW (ref 96–112)
Creatinine, Ser: 1.92 mg/dL — ABNORMAL HIGH (ref 0.4–1.5)
GFR calc Af Amer: 42 mL/min — ABNORMAL LOW (ref >60)
GFR calc non Af Amer: 35 mL/min — ABNORMAL LOW (ref >60)
Glucose, Bld: 108 mg/dL — ABNORMAL HIGH (ref 70–99)
Potassium: 3.5 mEq/L (ref 3.5–5.1)
Potassium: 3.6 mEq/L (ref 3.5–5.1)
Sodium: 141 mEq/L (ref 135–145)
Sodium: 142 mEq/L (ref 135–145)
Total Bilirubin: 0.8 mg/dL (ref 0.3–1.2)
Total Protein: 6.1 g/dL (ref 6.0–8.3)
Total Protein: 6.7 g/dL (ref 6.0–8.3)

## 2011-01-26 LAB — BASIC METABOLIC PANEL
CO2: 33 mEq/L — ABNORMAL HIGH (ref 19–32)
Calcium: 8.2 mg/dL — ABNORMAL LOW (ref 8.4–10.5)
Chloride: 102 mEq/L (ref 96–112)
Creatinine, Ser: 1.44 mg/dL (ref 0.4–1.5)
GFR calc Af Amer: 59 mL/min — ABNORMAL LOW (ref >60)
Sodium: 143 mEq/L (ref 135–145)

## 2011-01-26 LAB — HEMOGLOBIN A1C
Hgb A1c MFr Bld: 5.8 % (ref 4.6–6.1)
Mean Plasma Glucose: 120 mg/dL

## 2011-01-26 LAB — CK TOTAL AND CKMB (NOT AT ARMC)
CK, MB: 1.1 ng/mL (ref 0.3–4.0)
Relative Index: INVALID (ref 0.0–2.5)
Total CK: 36 U/L (ref 7–232)

## 2011-01-26 LAB — APTT: aPTT: 27 seconds (ref 24–37)

## 2011-01-26 LAB — DIFFERENTIAL
Basophils Absolute: 0 10*3/uL (ref 0.0–0.1)
Basophils Relative: 1 % (ref 0–1)
Eosinophils Absolute: 0.2 10*3/uL (ref 0.0–0.7)
Monocytes Absolute: 0.6 10*3/uL (ref 0.1–1.0)
Monocytes Relative: 8 % (ref 3–12)

## 2011-01-26 LAB — CBC
HCT: 38.4 % — ABNORMAL LOW (ref 39.0–52.0)
Hemoglobin: 12.9 g/dL — ABNORMAL LOW (ref 13.0–17.0)
MCHC: 33.5 g/dL (ref 30.0–36.0)
MCHC: 33.6 g/dL (ref 30.0–36.0)
MCV: 97.2 fL (ref 78.0–100.0)
Platelets: 206 10*3/uL (ref 150–400)
Platelets: 230 10*3/uL (ref 150–400)
RBC: 3.95 MIL/uL — ABNORMAL LOW (ref 4.22–5.81)
RDW: 13.6 % (ref 11.5–15.5)
RDW: 14 % (ref 11.5–15.5)
WBC: 7.3 10*3/uL (ref 4.0–10.5)

## 2011-01-26 LAB — CARDIAC PANEL(CRET KIN+CKTOT+MB+TROPI)
CK, MB: 1 ng/mL (ref 0.3–4.0)
CK, MB: 1.3 ng/mL (ref 0.3–4.0)
Relative Index: INVALID (ref 0.0–2.5)
Relative Index: INVALID (ref 0.0–2.5)
Total CK: 26 U/L (ref 7–232)
Troponin I: 0.02 ng/mL (ref 0.00–0.06)

## 2011-01-26 LAB — LIPID PANEL
Cholesterol: 150 mg/dL (ref 0–200)
LDL Cholesterol: 89 mg/dL (ref 0–99)

## 2011-01-26 LAB — URINALYSIS, ROUTINE W REFLEX MICROSCOPIC
Hgb urine dipstick: NEGATIVE
Ketones, ur: NEGATIVE mg/dL
Nitrite: NEGATIVE
Urobilinogen, UA: 0.2 mg/dL (ref 0.0–1.0)

## 2011-01-26 LAB — TROPONIN I: Troponin I: 0.03 ng/mL (ref 0.00–0.06)

## 2011-01-26 LAB — GLUCOSE, CAPILLARY: Glucose-Capillary: 106 mg/dL — ABNORMAL HIGH (ref 70–99)

## 2011-01-26 LAB — TSH: TSH: 1.776 u[IU]/mL (ref 0.350–4.500)

## 2011-01-26 LAB — PROTIME-INR
INR: 1 (ref 0.00–1.49)
Prothrombin Time: 12.6 seconds (ref 11.6–15.2)

## 2011-01-26 LAB — URINE CULTURE

## 2011-01-31 LAB — URINALYSIS, ROUTINE W REFLEX MICROSCOPIC
Glucose, UA: NEGATIVE mg/dL
Hgb urine dipstick: NEGATIVE
Protein, ur: NEGATIVE mg/dL
Specific Gravity, Urine: 1.01 (ref 1.005–1.030)

## 2011-01-31 LAB — LIPID PANEL
Cholesterol: 181 mg/dL (ref 0–200)
HDL: 27 mg/dL — ABNORMAL LOW (ref >39)
LDL Cholesterol: 108 mg/dL — ABNORMAL HIGH (ref 0–99)
Triglycerides: 229 mg/dL — ABNORMAL HIGH (ref <150)

## 2011-01-31 LAB — COMPREHENSIVE METABOLIC PANEL
AST: 26 U/L (ref 0–37)
Albumin: 3 g/dL — ABNORMAL LOW (ref 3.5–5.2)
BUN: 27 mg/dL — ABNORMAL HIGH (ref 6–23)
Calcium: 7.4 mg/dL — ABNORMAL LOW (ref 8.4–10.5)
Creatinine, Ser: 1.74 mg/dL — ABNORMAL HIGH (ref 0.4–1.5)
GFR calc Af Amer: 47 mL/min — ABNORMAL LOW (ref >60)
Total Bilirubin: 0.6 mg/dL (ref 0.3–1.2)
Total Protein: 6.8 g/dL (ref 6.0–8.3)

## 2011-01-31 LAB — BLOOD GAS, ARTERIAL
O2 Content: 2 L/min
pCO2 arterial: 50.4 mmHg — ABNORMAL HIGH (ref 35.0–45.0)
pO2, Arterial: 74.7 mmHg — ABNORMAL LOW (ref 80.0–100.0)

## 2011-01-31 LAB — CBC
HCT: 34.5 % — ABNORMAL LOW (ref 39.0–52.0)
HCT: 35.4 % — ABNORMAL LOW (ref 39.0–52.0)
Hemoglobin: 11.5 g/dL — ABNORMAL LOW (ref 13.0–17.0)
Hemoglobin: 11.7 g/dL — ABNORMAL LOW (ref 13.0–17.0)
MCHC: 34.8 g/dL (ref 30.0–36.0)
MCV: 97.7 fL (ref 78.0–100.0)
Platelets: 277 10*3/uL (ref 150–400)
Platelets: 292 10*3/uL (ref 150–400)
Platelets: 324 10*3/uL (ref 150–400)
RDW: 14.8 % (ref 11.5–15.5)
RDW: 14.9 % (ref 11.5–15.5)
WBC: 7.4 10*3/uL (ref 4.0–10.5)
WBC: 8.1 10*3/uL (ref 4.0–10.5)

## 2011-01-31 LAB — DIFFERENTIAL
Basophils Absolute: 0 10*3/uL (ref 0.0–0.1)
Basophils Absolute: 0.1 10*3/uL (ref 0.0–0.1)
Eosinophils Relative: 3 % (ref 0–5)
Lymphocytes Relative: 14 % (ref 12–46)
Lymphocytes Relative: 23 % (ref 12–46)
Lymphs Abs: 1.1 10*3/uL (ref 0.7–4.0)
Monocytes Absolute: 0.6 10*3/uL (ref 0.1–1.0)
Monocytes Relative: 7 % (ref 3–12)
Neutro Abs: 4.5 10*3/uL (ref 1.7–7.7)
Neutro Abs: 6.1 10*3/uL (ref 1.7–7.7)

## 2011-01-31 LAB — BASIC METABOLIC PANEL
BUN: 21 mg/dL (ref 6–23)
Calcium: 7.3 mg/dL — ABNORMAL LOW (ref 8.4–10.5)
GFR calc non Af Amer: 38 mL/min — ABNORMAL LOW (ref >60)
GFR calc non Af Amer: 51 mL/min — ABNORMAL LOW (ref >60)
Glucose, Bld: 92 mg/dL (ref 70–99)
Potassium: 3.3 mEq/L — ABNORMAL LOW (ref 3.5–5.1)
Potassium: 3.9 mEq/L (ref 3.5–5.1)
Sodium: 143 mEq/L (ref 135–145)
Sodium: 144 mEq/L (ref 135–145)

## 2011-01-31 LAB — URINE CULTURE

## 2011-01-31 LAB — POCT I-STAT, CHEM 8
Creatinine, Ser: 2 mg/dL — ABNORMAL HIGH (ref 0.4–1.5)
HCT: 38 % — ABNORMAL LOW (ref 39.0–52.0)
Hemoglobin: 12.9 g/dL — ABNORMAL LOW (ref 13.0–17.0)
Sodium: 141 mEq/L (ref 135–145)
TCO2: 39 mmol/L (ref 0–100)

## 2011-01-31 LAB — URINE MICROSCOPIC-ADD ON

## 2011-01-31 LAB — CK TOTAL AND CKMB (NOT AT ARMC)
CK, MB: 0.7 ng/mL (ref 0.3–4.0)
Total CK: 53 U/L (ref 7–232)

## 2011-01-31 LAB — POCT CARDIAC MARKERS: Myoglobin, poc: 166 ng/mL (ref 12–200)

## 2011-01-31 LAB — CULTURE, BLOOD (ROUTINE X 2)
Culture: NO GROWTH
Culture: NO GROWTH

## 2011-01-31 LAB — LIPASE, BLOOD: Lipase: 39 U/L (ref 11–59)

## 2011-01-31 LAB — CARDIAC PANEL(CRET KIN+CKTOT+MB+TROPI)
CK, MB: 0.8 ng/mL (ref 0.3–4.0)
Total CK: 50 U/L (ref 7–232)

## 2011-01-31 LAB — TSH: TSH: 0.392 u[IU]/mL (ref 0.350–4.500)

## 2011-02-04 ENCOUNTER — Ambulatory Visit: Payer: Medicare Other | Admitting: Endocrinology

## 2011-02-11 ENCOUNTER — Telehealth: Payer: Self-pay | Admitting: Endocrinology

## 2011-02-11 ENCOUNTER — Other Ambulatory Visit (INDEPENDENT_AMBULATORY_CARE_PROVIDER_SITE_OTHER): Payer: Medicare Other

## 2011-02-11 ENCOUNTER — Encounter: Payer: Self-pay | Admitting: Endocrinology

## 2011-02-11 ENCOUNTER — Ambulatory Visit (INDEPENDENT_AMBULATORY_CARE_PROVIDER_SITE_OTHER): Payer: Medicare Other | Admitting: Endocrinology

## 2011-02-11 DIAGNOSIS — E890 Postprocedural hypothyroidism: Secondary | ICD-10-CM

## 2011-02-11 DIAGNOSIS — E209 Hypoparathyroidism, unspecified: Secondary | ICD-10-CM

## 2011-02-11 LAB — BASIC METABOLIC PANEL
BUN: 18 mg/dL (ref 6–23)
Calcium: 9.2 mg/dL (ref 8.4–10.5)
Creatinine, Ser: 1.1 mg/dL (ref 0.4–1.5)
GFR: 72.3 mL/min (ref >60.00)
Glucose, Bld: 88 mg/dL (ref 70–99)

## 2011-02-11 LAB — MAGNESIUM: Magnesium: 1.9 mg/dL (ref 1.5–2.5)

## 2011-02-11 NOTE — Progress Notes (Signed)
  Subjective:    Patient ID: [name] [name], male    DOB: December 18, 1940, 70 y.o.   MRN: [id number]  HPI Pt had thyroidectomy many years ago.  He says pathol was benign.  Since then, he has hypothyroidism and hypoparathyroidism.  i last saw this pt almost 3 years ago. pt states he feels well in general, except for fatigue. Past Medical History  Diagnosis Date  . Other congenital hamartoses, not elsewhere classified   . Family history of ischemic heart disease   . Family history of malignant neoplasm of gastrointestinal tract   . Allergic rhinitis, cause unspecified   . Other and unspecified hyperlipidemia   . Unspecified essential hypertension   . Obesity hypoventilation syndrome     failed cpap/bipap trial   No past surgical history on file.  reports that he has never smoked. He does not have any smokeless tobacco history on file. His alcohol and drug histories not on file. family history includes Cancer in his other and Heart disease in his other. Allergies no known allergies Review of Systems Denies cramps.    Objective:   Physical Exam GENERAL: no distress.  Has 02 on Neck: a healed scar is present.  i do not appreciate a nodule in the thyroid or elsewhere in the neck Gait:  Slow but steady, with a walker.    Labs: Bmet, mg++, and tsh are normal   Assessment & Plan:  Hypoparathyroidism, postsurgical.  In view of mild renal insuff, i agree with dr patel going with the rocaltrol, instead of the ergocalciferol. Hypothyroidism, postsurgical.  Well-replaced.

## 2011-02-11 NOTE — Patient Instructions (Addendum)
blood tests are being ordered for you today.  We'll forward results to [facility]. Goal will be normal tsh, and ca++ between 8 and 9. Return here as needed.

## 2011-02-18 ENCOUNTER — Encounter: Payer: Self-pay | Admitting: Endocrinology

## 2011-02-19 ENCOUNTER — Encounter: Payer: Self-pay | Admitting: Endocrinology

## 2011-02-25 ENCOUNTER — Emergency Department (HOSPITAL_COMMUNITY)
Admission: EM | Admit: 2011-02-25 | Discharge: 2011-02-25 | Disposition: A | Discharge status: Home / Self Care | Payer: Medicare Other | Attending: Emergency Medicine | Admitting: Emergency Medicine

## 2011-02-25 ENCOUNTER — Emergency Department (HOSPITAL_COMMUNITY): Payer: Medicare Other

## 2011-02-25 DIAGNOSIS — I2510 Atherosclerotic heart disease of native coronary artery without angina pectoris: Secondary | ICD-10-CM | POA: Insufficient documentation

## 2011-02-25 DIAGNOSIS — Q859 Phakomatosis, unspecified: Secondary | ICD-10-CM | POA: Insufficient documentation

## 2011-02-25 DIAGNOSIS — G8929 Other chronic pain: Secondary | ICD-10-CM | POA: Insufficient documentation

## 2011-02-25 DIAGNOSIS — R5381 Other malaise: Secondary | ICD-10-CM | POA: Insufficient documentation

## 2011-02-25 DIAGNOSIS — I10 Essential (primary) hypertension: Secondary | ICD-10-CM | POA: Insufficient documentation

## 2011-02-25 DIAGNOSIS — G309 Alzheimer's disease, unspecified: Secondary | ICD-10-CM | POA: Insufficient documentation

## 2011-02-25 DIAGNOSIS — Z79899 Other long term (current) drug therapy: Secondary | ICD-10-CM | POA: Insufficient documentation

## 2011-02-25 DIAGNOSIS — M542 Cervicalgia: Secondary | ICD-10-CM | POA: Insufficient documentation

## 2011-02-25 DIAGNOSIS — I509 Heart failure, unspecified: Secondary | ICD-10-CM | POA: Insufficient documentation

## 2011-02-25 DIAGNOSIS — M545 Low back pain, unspecified: Secondary | ICD-10-CM | POA: Insufficient documentation

## 2011-02-25 DIAGNOSIS — E039 Hypothyroidism, unspecified: Secondary | ICD-10-CM | POA: Insufficient documentation

## 2011-02-25 DIAGNOSIS — L989 Disorder of the skin and subcutaneous tissue, unspecified: Secondary | ICD-10-CM | POA: Insufficient documentation

## 2011-02-25 DIAGNOSIS — F0280 Dementia in other diseases classified elsewhere without behavioral disturbance: Secondary | ICD-10-CM | POA: Insufficient documentation

## 2011-02-25 LAB — URINALYSIS, ROUTINE W REFLEX MICROSCOPIC
Bilirubin Urine: NEGATIVE
Nitrite: NEGATIVE
Specific Gravity, Urine: 1.014 (ref 1.005–1.030)
Urobilinogen, UA: 0.2 mg/dL (ref 0.0–1.0)
pH: 6 (ref 5.0–8.0)

## 2011-02-25 LAB — POCT I-STAT, CHEM 8
Chloride: 101 mEq/L (ref 96–112)
Glucose, Bld: 86 mg/dL (ref 70–99)
HCT: 38 % — ABNORMAL LOW (ref 39.0–52.0)
Hemoglobin: 12.9 g/dL — ABNORMAL LOW (ref 13.0–17.0)
Potassium: 4.3 mEq/L (ref 3.5–5.1)
Sodium: 137 mEq/L (ref 135–145)

## 2011-02-25 LAB — OCCULT BLOOD, POC DEVICE: Fecal Occult Bld: NEGATIVE

## 2011-03-05 NOTE — Op Note (Signed)
NAME:  [name], [name]               ACCOUNT NO.:  [id number]   MEDICAL RECORD NO.:  [id number]          PATIENT TYPE:  AMB   LOCATION:  [facility]                         FACILITY:  [facility]   PHYSICIAN:  Tyrone Apple. Karleen Hampshire, M.D.DATE OF BIRTH:  1941-02-06   DATE OF PROCEDURE:  01/27/2008  DATE OF DISCHARGE:                               OPERATIVE REPORT   PREOPERATIVE DIAGNOSIS:  Diplopia, secondary to exotropia and left  hypotropia.   PROCEDURE:  1. Left severe oblique recession of 5 mm.  2. Left lateral rectus recession of 7 mm.   SURGEON:  Tyrone Apple. Karleen Hampshire, M.D.   ANESTHESIA:  General anesthesia with laryngeal mask AOA.   INDICATIONS FOR PROCEDURE:  [name] [name] is a 70 year old white  male with diplopia and eye strain, secondary to exotropia and left  hypotropia.  This procedure is indicated to relieve the diplopia and  restore single binocular vision, restore alignment of the visual axis  and relieve the asthenopia.  The risks and benefits of the procedure  were explained to the patient prior to the procedure.  An informed  consent was obtained.  The patient was also explained the risks and  benefits of the procedure in the presence of his daughter.  An informed  consent was obtained.   DESCRIPTION OF PROCEDURE:  The patient was taken into the operating room  and placed in the supine position.  The entire face was prepped and  draped in the usual sterile manner.  Attention was first directed to the  left eye.  A lid speculum was placed.  Forced duction tests were  performed and found to be negative.  The globe was held in the superior  temporal quadrant.  The eye was depressed and adducted.  An incision was  made through the superior temporal fornix and taken down to the  posterior sub-Tenon's space.  The right superior rectus tendon was then  isolated on a Stevens hook.  Subsequently a Green hook was passed  beneath the tendon.  This was used to hold the globe in a  depressed and  adducted position.  Next the superior rectus tendon was then carefully  isolated from its overlying muscle fascia and its intermuscular septum  for a distance of approximately 15 mm.  The superior oblique tendon was  then carefully exposed under direct visualization, coursing inferior to  the superior rectus tendon  It was then isolated on two Stevens hooks.  A small Connor retractor was placed into the incision site.  The  superior oblique was then isolated and brought into the dissection and  placed on a Green hook and then a mark was placed at the insertion of  the superior oblique, straddling the vortex vein.  A mark was placed on  either side of the insertion of the superior oblique tendon.  Next, the  tendon was then imbricated on #6-0 Vicryl suture, taking two locking  bites on the ends and the tendon was then dissected free from the globe  and a mark was then placed on the globe in the same orientation and  direction, parallel  to the previous direction of the superior oblique  fibers 5 mm from its previous insertion.  The tendon was then reattached  to the globe using the pre-placed sutures at 5 mm proximal to its  previous insertion.  The sutures were tied securely and the conjunctiva  was repositioned.   My attention was then directed to the left lateral rectus.  The globe  was then held in the inferior temporal quadrant.  The eye was elevated  and adducted.  An incision was made through the inferior temporal fornix  and taken down to the posterior sub-Tenon's space in the left lateral  rectus 10 mm and isolated on a Stevens hook.  Subsequently a Green hook  was passed beneath the tendon and this was used to hold the globe in an  elevated and abducted position.  Next, the left lateral rectus tendon  was imbricated on a #6-0 Vicryl suture, taking two locking bites at the  ends.  It was then recessed exactly 7 mm from its native insertion and  was reattached to the  globe using pre-placed sutures.  The sutures were  tied securely and the conjunctiva was then repositioned.  There were no  apparent complications.  It should be noted that at the completion of  the superior oblique recession, the anterior chamber had shallowed  momentarily and this was carefully investigated for possible injury to  the globe, and no fluid emission was noted at the reattachment sites of  the superior oblique, and the globe reformed its integrity and turgor  within a few minutes.  The assesment was that the vitreous had  decompressed under the pressure of holding the eye in a depressed  adducted position eventuating in a fluid shift from anterior to  posterior chambers when the pressure was released.  At the completion of  the procedure TobraDex ointment was instilled in the inferior fornices  of the left eye.  There were no apparent complications.   The patient was subsequently awakened from the anesthesia and  transferred to the recovery room awake and in stable condition.      Casimiro Needle A. Karleen Hampshire, M.D.  Electronically Signed     MAS/MEDQ  D:  01/27/2008  T:  01/27/2008  Job:  [id number]

## 2011-03-05 NOTE — Discharge Summary (Signed)
[name] [name], [name] [name]               ACCOUNT NO.:  [id number]   MEDICAL RECORD NO.:  [id number]          PATIENT TYPE:  INP   LOCATION:  3028                         FACILITY:  [facility]   PHYSICIAN:  Hollice Espy, M.D.DATE OF BIRTH:  1941/06/13   DATE OF ADMISSION:  06/13/2009  DATE OF DISCHARGE:  06/15/2009                               DISCHARGE SUMMARY   PCP:  Dr. Tally Joe, [facility].   DISCHARGE DIAGNOSES:  1. Acute renal failure felt to be secondary to medication, now      resolved.  2. Slurred speech, now resolved, felt to be secondary to #1.  3. Weakness felt to be secondary to #1.  Both #2 and #3 have since      resolved.   The rest of the patient's medical issues:  1. History of BPH.  2. History of hypertension.  3. History of peripheral neuropathy.  4. History of anxiety.  5. History of sleep disturbance.  6. History of gout.  7. History of depression.  8. Hypothyroidism.   DISCHARGE MEDICATIONS FOR THIS PATIENT:  Patient will be resuming all of  his previous medications with the exceptions as follows:  1. His Lasix 40 mg is being put on hold.  This medication will be      resumed back on June 18, 2009.  2. Patient was previously on Lopressor 25 mg.  This medication will be      resumed on June 17, 2009.   The rest of patient's medications will be resumed upon return back to  assisted living.  These are as follows:  1. MiraLax 17 g p.o. daily.  2. K-Dur 20 mEq p.o. daily.  3. Flomax 0.4 p.o. daily.  4. Vitamin D 400 units daily.  5. Lodine 400 p.o. b.i.d.  6. Gemfibrozil 600 p.o. b.i.d.  7. Lyrica 75 mg p.o. 3 times a day.  8. Zantac 150 p.o. b.i.d.  9. Restoril 30 mg p.o. q.h.s.  10.Nystatin with triamcinolone cream apply as needed to topical rash.  11.Tylenol No. 3 p.o. every 6 hours p.r.n. for pain.  12.Imodium 2 mg 1 tab after each episode of loose stool as needed.  13.Tessalon Perles 1 to 2 caps p.o. t.i.d. as needed for cough.  14.Klonopin 0.5 p.o. b.i.d. as needed for anxiety.  15.Colchicine 0.6 p.o. every 4 to 6 hours p.r.n. for gout attack.  16.Glycerin swab as needed.  17.Vicodin 5/500 one p.o. every 4 to 6 hours p.r.n. for pain.  18.Lactulose 30 mL p.o. b.i.d. p.r.n.  19.Nitroglycerin 0.4 spray as needed for chest pain.  20.Extra Strength Tylenol 500 mg p.o. every 6 hours as needed for      pain.  21.Vicks Vapor Rub to chest as needed for congestion.  22.Tussionex suspension 5 mL nightly as needed.  23.Wellbutrin XL 150 three tabs p.o. daily.  24.Calcitriol 0.5 mg daily.  25.Os-Cal D 500 mg p.o. t.i.d.  26.Fish oil 1000 p.o. daily.  27.Flonase 2 sprays daily.  28.Hydrochlorothiazide 12.5 p.o. daily.  29.Synthroid 200 mcg p.o. daily.  30.Claritin 10 mg p.o. daily.  31.Plavix 75 mg p.o. daily.  HOSPITAL COURSE:  Patient is a 70 year old white male with past medical  history as above who presented from his assisted living facility after  having 2 to 3 days of weakness and he felt that his speech was off.  The  patient's normal baseline is that he is quite alert and oriented with no  complaints, doing well, and is able to ambulate freely.  He said that he  has been feeling much more weak as of late and then in the last 2 days  prior to coming in to the emergency room was noting his speech was off.  He said it feels like it was sluggish.  When he presented to the  emergency room, he was found rather to have hypertension to be a  borderline blood pressure of 110/64.  His lab work was unremarkable  noting a normal white count, no shift, normal hemoglobin; however, his  BUN was elevated with a BUN of 32 and a creatinine of 1.92 when he  normally has normal renal function.  Urinalysis noted some moderate  trace protein but otherwise looked to be unremarkable.  No signs of any  urinary tract infection and the CT scan, again, showed no signs of any  acute abnormality or acute infarct.  Patient initially was  brought in as  a stroke rule out.  No TPA was administered given the fact that this was  inconclusive and also the time frame was too far out for TPA.  Patient  underwent an extensive testing including vascular Dopplers and an MRI,  all of which were negative.  However, he was also put on IV fluids for  his acute renal failure and his diuretics were held.  By hospital day 2,  his BUN and creatinine had much improved.  He was down to 29 and 1.76  and when I saw the patient his symptoms had completely resolved.  He  said he was feeling better and felt fine.  He had no focal deficits,  fully alert and oriented, and answered all questions appropriately.  Because his renal function was still not fully normal, we continued to  monitor the patient and continued IV fluids.  By hospital day 3, his BUN  was down to normal at 23, creatinine was still minimally elevated at  1.44 but at this point patient, again, was feeling stable.  The rest of  his other workup including TSH, A1c, all of which were unremarkable and  it was my feeling that the patient did not have a stroke or even a  transient ischemic attack and this was more secondary to acute renal  failure and dehydration given the time frame and resolution of symptoms  matching up with fluid and renal function labs.  I discussed this with  the patient and plan will be again to resume all of patient's home  medications except for his Lopressor as his blood pressure is slightly  on the softer side and resume this in 2 days and hold his Lasix while  continuing his hydrochlorothiazide and we will hold his Lasix for  another 3 days.  The rest of patient's other medical issues are stable.   DISCHARGE DIET:  Will be heart-healthy diet.   ACTIVITY:  Will be as tolerated as he is back to his baseline.  He is  being discharged back to [facility].   DISPOSITION:  Improved.   Patient will follow up with his PCP, Dr. Tally Joe, in the next  2 to  4 weeks.      Hollice Espy, M.D.  Electronically Signed     SKK/MEDQ  D:  06/15/2009  T:  06/15/2009  Job:  [id number]   cc:   Tally Joe, M.D.  [address]

## 2011-03-05 NOTE — Assessment & Plan Note (Signed)
[facility]                             PULMONARY OFFICE NOTE   NAME:[name], [name] MCFERRAN                      MRN:          [id number]  DATE:07/23/2007                            DOB:          Oct 12, 1941    HISTORY OF PRESENT ILLNESS:  The patient is a 70 year old gentleman who  I have been asked to see for chronic cough.  The patient states that he  has had a cough for at least the last 3 months and has tried many  things, including over-the-counter products and also hard candy.  The  cough is dry and hacking in nature, and is worse with prolonged  conversation and also taking a deep breath.  The patient is having  tickling in his throat and a lot of throat clearing.  The patient does  have some postnasal drip, and also describes gastroesophageal reflux  disease.  When questioned very closely there may be some chronic  microaspiration.  The patient has had a CT scan of the chest in July  with some mild compressive atelectasis at the bases.  His most recent  chest x-ray shows just the mild chronic changes at the bases and the  question of an elevated left hemidiaphragm.   PAST MEDICAL HISTORY:  1. Hypertension.  2. Dyslipidemia.  3. Allergic rhinitis.  4. History of sleep apnea for which he has been on CPAP in the past.  5. Status post total knee replacement bilaterally.  6. History of hypothyroidism.   CURRENT MEDICATIONS:  1. Coumadin of unknown dose.  2. Trazodone 100 mg 2 nightly.  3. Aricept 10 mg nightly.  4. Metoprolol 25 mg daily.  5. Flomax 0.4 mg daily.  6. Wellbutrin XL 300 daily.  7. Prilosec 40 mg daily.  8. Synthroid in varying doses.  9. Plavix 75 mg daily.  10.Paxil 40 mg daily.  11.Lopid 600 mg b.i.d.  12.Hydrochlorothiazide 12.5 mg daily.  13.Klonopin 0.5 mg b.i.d. p.r.n.   The patient has no known drug allergies.   SOCIAL HISTORY:  He is married and has children.  He is retired from the  [employer].  He  lives with his wife.  He never smoked.   FAMILY HISTORY:  Remarkable for father having multiple heart attacks,  both mother and brother have had colon cancer.   REVIEW OF SYSTEMS:  As per history of present illness.  Also see patient  intake form documented in the chart.   PHYSICAL EXAMINATION:  IN GENERAL:  He is an obese white male in no  acute distress.  Blood pressure is 112/68, pulse 57, temperature is 97.7, weight is 288  pounds.  He is 6 feet 3 inches tall.  O2 saturation on room air is 95%.  HEENT:  Pupils are equal, round, and reactive to light and  accommodation.  Extraocular muscles are intact.  Nares do show moderate  quantities of secretions.  Oropharynx is clear.  NECK:  Supple without JVD or lymphadenopathy.  No palpable thyromegaly.  CHEST:  Reveals decreased breath sounds in the left base, otherwise is  clear.  CARDIAC EXAM:  Reveals regular rate and rhythm.  ABDOMEN:  Soft, nontender, with good bowel sounds.  GENITAL/RECTAL/BREAST EXAM:  Not done and not indicated.  LOWER EXTREMITIES:  Show trace edema, pulses are intact distally.  NEUROLOGICALLY:  Alert and oriented with no obvious motor deficits.   IMPRESSION:  Chronic cough that I suspect is secondary to upper airway  dysfunction.  He clearly has a cyclical component to his cough, and I am  concerned that he may have postnasal drip and possibly laryngopharyngeal  reflux that is contributing to this.  At this point in time, I think we  need to aggressively suppress his cough, and also treat exacerbating  factors as well.   PLAN:  1. I have asked the patient to increase his Prilosec to b.i.d. dosing.  2. Take Zyrtec over the counter nightly.  3. I placed the patient on the cyclical cough protocol, and have given      him a patient education sheet with all of the directions.  This      includes taking Tussionex on a scheduled basis for the next 3 days      with voice rest and no throat clearing.  4. The patient  will follow up in 3 weeks or sooner if there are      problems.     Barbaraann Share, MD,FCCP  Electronically Signed    [facility]MC/MedQ  DD: 07/31/2007  DT: 07/31/2007  Job #: [id number]   cc:   [name] [name], M.D.

## 2011-03-05 NOTE — Op Note (Signed)
NAMEKERBY, BORNER               ACCOUNT NO.:  [id number]   MEDICAL RECORD NO.:  [id number]          PATIENT TYPE:  OIB   LOCATION:  5123                         FACILITY:  [facility]   PHYSICIAN:  Lennie Muckle, MD      DATE OF BIRTH:  03/28/1941   DATE OF PROCEDURE:  08/15/2008  DATE OF DISCHARGE:                               OPERATIVE REPORT   PREOPERATIVE DIAGNOSIS:  Symptomatic cholelithiasis.   POSTOPERATIVE DIAGNOSES:  1. Chronic cholecystitis.  2. Symptomatic cholelithiasis.   SURGEON:  Lennie Muckle, MD   ASSISTANTS:  Almond Lint, MD   FINDINGS:  Inflammation around the gallbladder with adhesions, patent  common bile duct, and large stone in the neck of the gallbladder.   SPECIMEN:  Gallbladder.   ESTIMATED BLOOD LOSS:  100 mL.   COMPLICATIONS:  No immediate complications.   DRAINS:  No drains were placed.   INDICATION FOR PROCEDURE:  [name] is a 70 year old male who I had  seen due to complaints of right-sided abdominal pain.  Originally, I  felt that he had a costochondritis, but due to his continued pain in his  right side of his abdomen especially of eating greasy foods.  I believe  he did likely suffer from symptomatic cholelithiasis.  I did talk to the  patient about laparoscopic possible open procedure.  Due to his pain, I  had also decided that cholangiogram would ensure no stones within the  common duct as well as help with some of the anatomy.  Informed consent  was obtained after discussing the risk of the surgery including but not  limited to bleeding, infection, hematoma, bile leak etc., and injury to  common duct.   DETAILS OF PROCEDURE:  Mr. Grunert was identified in the preoperative  holding area.  All questions were answered.  He received 2 g of  cefoxitin and was taken to the operating room.  Once in the operating  room, his abdomen was prepped and draped in the usual sterile fashion.  A time-out procedure indicating the patient and procedure  was performed.  I placed an incision above the umbilicus and using the Optiview placed a  #11-mm trocar into the abdomen.  All layers of abdominal wall were  visualized upon entry.  Then, after insufflating the abdomen with carbon  dioxide, inspected the abdomen.  There was no other injury upon  placement of the trocar.  The patient was then positioned in reverse  Trendelenburg and left side down.  I placed three 5-mm trocars under  visualization with the camera.  One is placed at the epigastric region  and 2 on the right side of the abdomen.  There was a large amount of  intraabdominal fat.  There were also adhesions of omentum to the  gallbladder.  These were sharply dissected with the laparoscopic  scissors.  I was able to clear off the fundus and retract this to the  head of the patient.  Due to his large size of abdomen made some of the  dissection difficult.  However, with continuing with Sultana dissection  near the  infundibulum I was able to dissect out the cystic duct and  cystic artery.  I placed the clip proximally and distally on the cystic  duct and partially transected this.  The cholangiogram catheter was  placed in the cystic duct.  The cholangiogram was performed with a  patent common bile duct.  No stones were seen.  The right and left  hepatic ducts were visualized.  I then removed the catheter and placed 3  clips proximally on the cystic duct.  Also clipped the cystic artery.  Ducts were transected with laparoscopic scissors.  I then continued with  the dissection with a hook electrocautery.  The peritoneum surrounding  the gallbladder was dissected off the liver bed with small amount of  oozing laterally.  Gallbladder was placed in an EndoCatch bag with a  small amount of bilious spillage during the procedure.  Once the  gallbladder was removed, I irrigated the abdomen with a liter of saline.  A small amount of oozing from the liver bed was controlled with   electrocautery.  The wound bed was further inspected.  I elected to  place Surgicel within the wound bed of the liver.  Further irrigation  revealed no significant amount of bleeding.  There was no significant  bleeding.  There was also no bile leak noted.  The irrigant was clear.  I then elected to close the supraumbilical port site with the 0 Vicryl  suture using the laparoscopic suture passer.  After this was closed, the  final inspection of the abdomen revealed no bleeding and no evidence of  intraabdominal injury.  I then released pneumoperitoneum and removed  trocars.  Skin was closed with 4-0 Monocryl.  Dermabond placed for final  dressing.  The patient was extubated and transported to post anesthesia  care unit in stable condition.  He will be monitored overnight and then  likely discharged home tomorrow.      Lennie Muckle, MD  Electronically Signed     ALA/MEDQ  D:  08/15/2008  T:  08/16/2008  Job:  [id number]   cc:   Tally Joe, M.D.

## 2011-03-05 NOTE — H&P (Signed)
NAMEVERNER, MCCRONE NO.:  [id number]   MEDICAL RECORD NO.:  [id number]          PATIENT TYPE:  EMS   LOCATION:  MINO                         FACILITY:  [facility]   PHYSICIAN:  Ramiro Harvest, MD    DATE OF BIRTH:  [birth year]   DATE OF ADMISSION:  12/29/2008  DATE OF DISCHARGE:                              HISTORY & PHYSICAL   PRIMARY CARE PHYSICIAN:  Dr. Azucena Cecil of  [facility].   HISTORY OF PRESENT ILLNESS:  [name] [name] is a 70 year old white  gentleman with history of questionable Alzheimer's dementia, BPH,  hypertension, hyperlipidemia, hypothyroidism, depression,  gastroesophageal reflux disease, BPH, questionable coronary artery  disease, positive family history of coronary artery disease who  presented to the ED with a 3-4 month history of worsening shortness of  breath on exertion, orthopnea, chills, intermittent right substernal  chest pain on lying supine lasting approximately a minute which improves  on sitting up.  The patient states that for the past month he has had to  sleep in a recliner  secondary to this.  The patient denies any fever,  no nausea or vomiting.  No abdominal pain, no diarrhea or constipation  or palpitations.  No melena or hematemesis.  No hematochezia.  No focal  neurological symptoms.  The patient stated that he presented to his  PCP's office and was told that he had a nodule on chest x-ray and was  sent to the ED for further evaluation of his symptoms.  Per ED physician  there was a concern for PE per primary care physician.  In the ED, point  of care  cardiac markers obtained were negative.  UA was negative for  nitrates, trace leukocytes.  Microscopy was negative.  BNP was less than  30.  Comprehensive metabolic profile obtained in the ED had a chloride  of 93, bicarb of 36, BUN of 27, creatinine of 1.74, albumin of 3.0,  calcium of 7.4, otherwise was within normal limits.  Coags were within  normal limits.  A CBC  obtained in the ED had a white count of 8.1,  hemoglobin 12.3, hematocrit 35.4, platelet count of 324 and ANC of 6.1.   Chest x-ray showed atelectasis and cardiomegaly without any pulmonary  edema.  We were called to admit the patient for further evaluation and  management.   ALLERGIES:  NO KNOWN DRUG ALLERGIES.   PAST MEDICAL HISTORY:  1. Hypertension.  2. Hyperlipidemia.  3. Allergic rhinitis.  4. History of sleep apnea for which he had been on C-PAP in the past.  5. Status post bilateral knee replacement.  6. History of hypothyroidism.  7. Hyperparathyroidism.  8. History of chronic cholecystitis status post lap chole August 15, 2008 per Dr. Freida Busman.  9. History of bilateral cataract surgery.  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  11.Rotator cuff tear status post repair August 24, 2007 per Dr.      Luiz Blare.  12.Status post left carpal tunnel release and trigger finger release,      September 05, 2006 per Dr. Luiz Blare.  13.Tardive dyskinesia.  14.Depression.  15.Benign  prostatic hypertrophy.  16.Gastroesophageal reflux disease.  17.Gastritis.  18.History of coronary artery disease.  19.Alzheimer's dementia.  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   HOME MEDICATIONS:  The patient is not sure of what medications he is on  but per ED records, he is supposed to be on trazodone 200 mg p.o.  q.h.s., metoprolol 25 mg p.o. daily, Flomax 0.4 mg p.o. daily,  Wellbutrin XL 300 mg p.o. daily, Prilosec 40 mg p.o. daily, Synthroid  dose unknown, Plavix 75 mg p.o. daily,  Lopid 600 mg p.o. b.i.d., HCTZ  12.5 mg p.o. daily and calcitriol dose known.   SOCIAL HISTORY:  The patient lives in assisted living facility at the  [facility].  He is a  widower x2.  No tobacco use.  No alcohol use.  No IV drug use.   FAMILY HISTORY:  Noncontributory except father deceased in his 29s from  acute MI and also a family history of colon cancer.   REVIEW OF SYSTEMS:  As per HPI, otherwise negative.   PHYSICAL EXAMINATION:   VITAL SIGNS:  Temperature 97.7, blood pressure  88/60 up to 96/70, up to 104/74, pulse of 85, respiratory rate 22,  saturating 99% on 4 liters nasal cannula.  GENERAL:  Patient no apparent distress.  HEENT: Normocephalic, atraumatic.  Pupils equal, round and reactive  light and accommodation.  Extraocular movements intact.  Oropharynx is  clear.  No lesions or exudates.  NECK:  Supple.  No lymphadenopathy.  Dry mucous membranes.  RESPIRATORY:  Lungs are clear to auscultation bilaterally.  CARDIOVASCULAR:  Distant heart sounds, regular rhythm.  No murmurs,  rubs, gallops.  ABDOMEN:  Soft, obese, nontender, nondistended.  Positive bowel sounds.  EXTREMITIES:  No clubbing or cyanosis.  1 to 2+ bilateral lower  extremity edema.  NEUROLOGICAL:  The patient is alert and oriented x3.  Cranial nerves II-  XII are grossly intact.  No focal deficits.   ADMISSION LABS:  CBC:  White count 8.1, hemoglobin 12.3, hematocrit  35.4, platelets 324, ANC of 6.1, PT of 12.7, INR 0.9.  Point of care  cardiac markers negative x2.  Comprehensive metabolic profile:  Sodium  143, potassium 3.9, chloride 93, bicarb 36, glucose 85, BUN 27,  creatinine 1.74, bilirubin 0.6, alk phosphatase 94, AST 26, ALT 10,  protein 6.8, albumin 3.0, calcium of 7.4, BNP of less than 30.  UA was  yellow clear specific gravity 1.010, pH of 6, glucose negative,  bilirubin small ketones, negative blood, negative protein,  urobilinogen  0.2, nitrite negative, leukocytes trace.  Urine microscopy:  WBCs 0 to  2.   Chest x-ray obtained showed bibasilar atelectasis, cardiomegaly without  pulmonary edema, stable exam.   EKG shows a normal sinus rhythm with diffuse low voltage.   ASSESSMENT AND PLAN:  [name] [name] is a  70 year old gentleman with  a history of hypertension, hyperlipidemia, hypothyroidism,  hyperparathyroidism, obstructive sleep apnea, Alzheimer's  dementia,  questionable history of coronary artery disease who  presented to the ED  with a 3-4 months history of worsening shortness of breath and  substernal chest pain as well as orthopnea.   1. Shortness of breath/ chest pain, questionable etiology.      Differential includes cardiac in nature (pericardial effusion, has      EKG with a low voltage and improvement of chest pain on sitting      upright versus acute coronary syndrome)  versus pulmonary (      pneumonia versus pulmonary embolism vs obesity hypoventilation)  versus GI.  Admit the patient to telemetry.  Cycle cardiac enzymes      q.8 h x3.  Check a TSH.  Check a lipase.  Check a magnesium.  Check      a 2-D echo to rule out effusion/ left ventricular dysfunction.      Check a D-dimer.  Check a sputum Gram stain and culture.  Hold      blood pressure medications for now.  Hydrate with IV fluids and      follow.  Repeat chest x-ray in the morning.  2. Dehydration.  Place on IV fluids.  3. Hypotension likely secondary to hypovolemia versus cardiogenic      versus septic (unlikely as the patient is afebrile with a normal      white count) versus hemorrhagic which is unlikely with no evidence      of acute GI bleed.  Will admit the patient to telemetry.  Cycle      cardiac enzymes q.8 h x3.  Check a TSH.  Check a magnesium.  Check      a lipase.  Repeat chest x-ray in the morning.  Check a D-dimer.      Check a 2-D echo.  Place on IV fluids and follow.  4. Acute on chronic renal insufficiency, baseline creatinine of 1.5.      Likely prerenal azotemia versus renal versus postrenal.  Check a      fractional excretion of sodium.  Hydrate with IV fluids.  If no      improvement in renal function, check a renal ultrasound to rule out      hydronephrosis.  5. Benign prostatic hypertrophy.  Hold Flomax secondary to      hypotension.  6. Hypertension.  Hold blood pressure medications.  7. Hypothyroidism.  Check a TSH.  Will start on  Synthroid 75 mcg      daily.  Will need to verify the  patient's home dose of medications.  8. Depression.  Continue Wellbutrin.  9. Hyperparathyroidism.  Calcitriol.  10.Hyperlipidemia.  Check a fasting lipid panel, Lopid.  11.History of coronary artery disease.  See problem #1.  12.Alzheimer's  dementia.  Aricept.  13.Status post bilateral knee replacement.  14.Prophylaxis.  Protonix for GI prophylaxis.  SCDs for DVT      prophylaxis.   It has been a pleasure taking care of [name] [name].      Ramiro Harvest, MD  Electronically Signed     DT/MEDQ  D:  12/29/2008  T:  12/29/2008  Job:  [id number]   cc:   Tally Joe, M.D.

## 2011-03-05 NOTE — Discharge Summary (Signed)
[name] [name], [name] [name] NO.:  [id number]   MEDICAL RECORD NO.:  [id number]          PATIENT TYPE:  INP   LOCATION:  3733                         FACILITY:  [facility]   PHYSICIAN:  Ramiro Harvest, MD    DATE OF BIRTH:  [birth year]   DATE OF ADMISSION:  12/29/2008  DATE OF DISCHARGE:                               DISCHARGE SUMMARY   PRIMARY CARE PHYSICIAN:  Tally Joe, M.D. of [facility] physicians.   PULMONOLOGIST:  Barbaraann Share, MD,FCCP of  [facility].   DISCHARGE DIAGNOSES:  1. Shortness of breath/last chest pain.  2. Hypotension.  3. Dehydration.  4. Acute on chronic renal insufficiency.  5. Depression.  6. History of coronary artery disease.  7. Hypertension.  8. Alzheimer's dementia.  9. History of hypocalcemia.  10.Mixed hyperlipidemia.  11.Obesity.  12.Displacement of cervical intervertebral disk without myelopathy.  13.Cervicalgia.  14.Pain in joints in upper and lower extremities.  15.Osteoarthritis.  16.Hypothyroidism.  17.Hypoparathyroidism.  18.Hyperparathyroidism.  19.Anemia.  20.Anxiety.  21.External hemorrhoids.  22.Allergic rhinitis.  23.Gastroesophageal reflux disease.  24.Constipation.  25.Chronic kidney disease.  26.Insomnia.   DISCHARGE MEDICATIONS:  1. Plavix 75 mg p.o. daily  2. MiraLax 17 grams in 8 ounces of water daily.  3. Vitamin E 400 units daily.  4. Lasix 40 mg p.o. daily.  5. Lopid 600 mg p.o. b.i.d..  6. Wellbutrin 300 mg p.o. daily.  7. Calcium and vitamin D 2 tablets daily and the dose on each tablet      is 600/400.  8. Lodine XL 400 mg daily.  9. Flomax 0.4 mg p.o. daily.  10.Synthroid 200 mL daily.  11.Lasix 40 mg p.o. daily.  12.Microzide 12.5 mg p.o. daily.  13.Calcitriol 0.5 mg p.o. daily.  14.Nitroglycerin 0.4 mg sublingual q. 5 minutes p.r.n. chest pain.  15.Tylenol #3 one tablet q.6 h. p.r.n. pain.  16.Imodium 1 tablet as needed after each loose stool.  17.Tessalon Perles 100 mg tablet t.i.d.  p.r.n..  18.Clonazepam 0.5 mg p.o. b.i.d. p.r.n.  19.Colchicine 0.6 mg p.o. q.4-6 h. p.r.n.  20.Vicodin 5/500 1 tablet p.o. q.4-6 h. p.r.n.  21.Tussionex as needed at bedtime.  22.[name] cream twice daily to the buttocks as needed.  23.Nystatin Transdermal as needed.  24.K-Dur 20 mEq p.o. daily.  25.Polytrim to both eyes twice daily.  26.Zantac 150 mg p.o. b.i.d..  27.Restasis 0.05% drops 1 drop to both eyes twice daily.  28.__________ foam 1 inch to both eyes twice daily.  29.Veramyst 2 sprays to each nostril daily.  30.Astelin spray to each nostril twice daily.  31.Lyrica 75 mg p.o. at bedtime.  32.Seroquel 50 mg p.o. at bedtime.  33.Loratadine 10 mg p.o. daily.  34.Lotemax 0.5% drops to both eyes daily.  35.Metoprolol 25 mg p.o. daily.   DISPOSITION AND FOLLOW UP:  The patient will be discharged back to the  [facility].  He is to follow up with his PCP in 1 week.  On follow-  up the patient's blood pressure needs to be reassessed.  The patient's  Lasix was decreased to 40 mg daily on discharge as well as his potassium  was decreased to 20 mEq daily.  A BMET will need to be obtained  to  follow up on the patient's electrolytes and renal function.  The patient  will likely need some orthostatics checked to make sure he is not  orthostatic from his diuretics.  The patient will also need to follow up  with his pulmonologist, Dr. Shelle Iron, in the next 2 weeks for pulmonary  function test and further workup of his shortness of breath.   CONSULTATIONS:  None.   PROCEDURES PERFORMED:  1. A chest x-ray was done on December 29, 2008 which showed basilar      atelectasis, cardiomegaly without pulmonary edema, stable exam.      Chest x-ray done on December 30, 2008 showed stable moderate      enlargement of the cardiac silhouette.  There is elevation of the      left hemidiaphragm with atelectasis in the left base, minimal      atelectasis in the right base.  No significant change from  previous      study.  2. A VQ scan was done on December 30, 2008 that showed no ventilation      abnormalities seen.  No perfusion abnormalities seen.  Low      probability for pulmonary embolism.  3. A 2-D echo was obtained on December 30, 2008 that showed a vigorous      left ventricular systolic function, EF of 65%.  No diagnostic left      ventricular regional wall motion abnormality was identified.      Possibility cannot be completely excluded on the basis of this      study.  Aortic valve was mildly calcified, mild aortic root      dilatation and mild mitral annular calcification.  Left atrium was      mildly dilated.   BRIEF HISTORY AND PHYSICAL:  [name] is a 70 year old white  gentleman, history of questionable Alzheimer's dementia, BPH,  hypertension, hyperlipidemia, hypothyroidism, depression,  gastroesophageal reflux disease, BPH, questionable coronary artery  disease, positive family history of coronary artery disease presented to  the ED with a 3 to 4 month history of worsening shortness of breath on  exertion, orthopnea, chills, intermittent right substernal chest pain on  lying supine lasting approximately a minute, improvement on sitting up.  The patient stated that for the past month he had to sleep in a recliner  secondary to this.  The patient denied any fever, no nausea or vomiting,  abdominal pain, no diarrhea, no constipation or palpitations.  No melena  or hematemesis, no hematochezia, no focal neurological symptoms.  The  patient stated that he presented to his primary care physician's office.  Was  told that he had a nodule on a chest x-ray was sent to the ED for  further evaluation of his symptoms.  Per the ED physician, there was a  concern for pulmonary embolism per primary care physician in the ED.  Point of care cardiac markers obtained were negative.  Urinalysis was  negative for nitrites, trace leukocytes.  Microscopy was negative.  BNP  was less  than 30.  Comprehensive metabolic profile obtained in the ED  had a chloride of 93, bicarb of 36, BUN of 27, creatinine of 1.74,  albumin of 3, calcium of 7.4, otherwise within normal limits.  Coags  were within normal limits.  CBC obtained in the ED with a white count of  8.1, hemoglobin 12.3, hematocrit 35.4, platelets of 324,000,  ANC of 6.1.  Chest x-ray as stated above.   PHYSICAL EXAMINATION:  VITAL SIGNS:  Temperature of 97.7, blood pressure  88/60, up to 96/70, up to 104/74, pulse of 85, respiratory rate 22,  oxygen saturation 99% of 4 liters nasal cannula.  GENERAL:  The patient had no apparent distress.  HEENT: Normocephalic, atraumatic.  Pupils equal, round and reactive to  light and accommodation.  Extraocular movements intact.  Oropharynx was  clear.  No lesions or exudates.  NECK:  Neck was supple.  No lymphadenopathy.  Dry mucous membranes.  RESPIRATORY:  Lungs were clear to auscultation bilaterally.  CARDIOVASCULAR:  Distant heart sounds, regular rhythm.  No murmurs, rubs  or gallops.  ABDOMEN:  Abdomen was obese, soft, nontender, nondistended, positive  bowel sounds.  EXTREMITIES:  No clubbing, cyanosis, 1 to 2+ bilateral lower extremity  edema.  NEUROLOGIC:  The patient was alert and oriented x3.  Cranial nerves II-  XII are grossly intact.  No focal deficits.   ADMISSION LABORATORY DATA:  CBC:  White count 8.1, hemoglobin 12.3,  hematocrit 35.4, platelets of 324,000, ANC of 6.1, PT of 12.7, INR 0.9.  Point of care cardiac markers were negative x2.  Comprehensive metabolic  profile:  Sodium 143, potassium 3.9, chloride 93, bicarb 36, glucose 85,  BUN 27, creatinine 1.74, bilirubin of 0.6, alk phosphatase 94, AST 26,  ALT 10, protein 6.8, albumin 3, calcium 7.4.  BNP was less than 30.  Urinalysis was yellow, clear, specific gravity 1.010, pH of 6, glucose  negative, bilirubin, small ketones negative, blood negative, protein  negative, urobilinogen 0.2, nitrite  negative, leukocytes trace.  Urine  microscopy showed wbc's 03/02 on chest x-ray as stated above.  EKG had  normal sinus rhythm with diffuse low voltage.   HOSPITAL COURSE:  1. Shortness of breath/chest pain, likely secondary to obesity      hypoventilation syndrome.  The patient was admitted for shortness      of breath and chest pain.  On initial differential cardiac causes      were considered.  There was the possibility of a pericardial      effusion as the EKG had diffuse low voltage but did not have the      necessarily characteristics of EKG changes noted on pericardial      effusion.  The patient was admitted to the telemetry unit.  Cardiac      enzymes were cycled which came back negative.  TSH was obtained      which was within normal limits.  Lipase was also obtained which was      within the normal limits.  A D-dimer was obtained which came back      positive and as such VQ scan was obtained which came back negative      as stated above.  The patient was initially placed on Lovenox full      dose while waiting on the VQ scan and once VQ scan was negative,      full-dose Lovenox was then changed to prophylactic dose Lovenox.      The patient improved symptomatically during the hospitalization.  A      2-D echo was also obtained with results as stated above which were      negative for any sort of pericardial effusion.  The patient      improved symptomatically such that by day of discharge the patient      was in stable and improved condition.  ABG was also obtained which      had a pH of 7.43 a pCO2 of 50 and pO2 of 75, bicarb of 32 and a O2      level of 94.3.  It was felt that the patient's symptoms of      shortness of breath and chest pain were likely secondary to obesity      hypoventilation syndrome.  The patient will need outpatient follow-      up and further evaluation by his pulmonologist, Dr. Shelle Iron of      [facility] for possible PFTs and further workup of  his      shortness of breath.  By day of discharge, the patient was in      stable and improved condition with follow-up as stated above.  2. Hypotension.  On admission the patient was noted to be hypotensive.      It was felt this was likely secondary to dehydration/hypovolemia.      Cardiac enzymes were cycled.  2-D echo was obtained.  Cardiac      workup was negative for any cardiac source for hypotension.  The      patient remained afebrile with a normal white count.  Chest x-ray      was negative.  Urinalysis was negative.  The patient did not have      any infectious etiology for his cause of his hypotension.  The      patient was hydrated with IV fluids with symptomatic improvement      and by day of discharge, the patient's hypotension had resolved and      the patient was in stable and improved condition.  3. Dehydration.  On admission the patient was noted to be extremely      dehydrated.  The patient was hydrated with IV fluids and was      euvolemic by day of discharge.  Acute on chronic renal      insufficiency.  The patient had presented with an acute on chronic      renal insufficiency.  It was felt this was a prerenal azotemia      secondary to hypotension.  The patient was hydrated with IV fluids      with daily improvement in his renal function.  By day of discharge,      the patient's creatinine was down to 1.38 and the patient will be      discharged in stable condition.  All of the patient's blood      pressure medications were held throughout the hospitalization.  The      patient's Lasix was held.  The patient will be discharged on the      lower dose of his Lasix of 40 mg daily with follow-up with his      primary care physician.   The rest of the patient's chronic medical issues were stable throughout  the hospitalization and the patient will be discharged in stable  condition to resume his prior home medications.  Vital signs on the day  of discharge were  temperature 98.7, blood pressure 118/78, pulse 84,  respiratory rate 18, oxygen saturation 99% on 2 liters nasal cannula.   DISCHARGE LABORATORY DATA:  Sodium 144, potassium 3.9, chloride 105,  bicarb 31, BUN 21, creatinine 1.3, glucose of 114, calcium of 7.3.  CBC  with a white count of 7.4, hemoglobin of 11.7, platelets of 292,000,  hematocrit of 34.5.   It was a pleasure taking  care of Mr. Rihaan Boulter.      Ramiro Harvest, MD  Electronically Signed     DT/MEDQ  D:  12/31/2008  T:  12/31/2008  Job:  [id number]   cc:   Tally Joe, M.D.  Barbaraann Share, MD,FCCP

## 2011-03-05 NOTE — H&P (Signed)
[name] [name], [name] [name] NO.:  [id number]   MEDICAL RECORD NO.:  [id number]          PATIENT TYPE:  EMS   LOCATION:  MAJO                         FACILITY:  [facility]   PHYSICIAN:  Ramiro Harvest, MD    DATE OF BIRTH:  04-13-1941   DATE OF ADMISSION:  06/13/2009  DATE OF DISCHARGE:                              HISTORY & PHYSICAL   PRIMARY CARE PHYSICIAN:  Dr. Azucena Cecil of [facility].   PULMONOLOGIST:  Barbaraann Share, MD,FCCP of [facility].   HISTORY OF PRESENT ILLNESS:  [name] [name] is a 70 year old resident of  the [facility], a white gentleman with multiple medical problems  including coronary artery disease, obesity, hypoventilation syndrome,  hyperlipidemia, GERD, chronic renal insufficiency, hypertension,  Alzheimer's dementia, who had presented to the ED with an expressive  aphasia and slurred speech which has been going on and off x1 week per  patient, but per ED records and nursing home, had been ongoing x1 day.  The patient denies any facial droop.  No visual changes, no numbness or  tingling.  No change in chronic lower extremity weakness, which is being  treated with steroid injections.  No fevers, no chills, no chest pain,  no change in chronic shortness of breath, no nausea or vomiting.  No  abdominal pain, no diarrhea or constipation.  No dysuria, no cough, no  headache, no other associated symptoms.  The patient saw his PCP today  for some routine labs.  He went back to the nursing home, and the PCP  was called and told about the patient's expressive aphasia.  The patient  was advised to present to the ED.  In the ED, urinalysis obtained was  bland.  Comprehensive metabolic profile had a BUN/creatinine of 32/1.92.  Last creatinine in March 2010 was 1.38.  CBC had a hemoglobin of 12.9,  otherwise was within normal limits.  Chest x-ray with cardiomegaly and  left basilar scarring with no acute cardiopulmonary disease.  Head CT  did not  show any acute ischemia.  We were called to admit the patient  for further evaluation and management.   ALLERGIES:  NO KNOWN DRUG ALLERGIES.   PAST MEDICAL HISTORY:  1. History of her chronic shortness of breath, felt to be secondary to      obesity hypoventilation syndrome.  2. Chronic renal insufficiency.  Last creatinine was 1.38 in March      2010.  3. Depression.  4. History of coronary artery disease.  5. Hypertension.  6. Alzheimer's dementia.  7. History of hypocalcemia.  8. Mixed hyperlipidemia.  9. Obesity.  10.Displacement of cervical intervertebral disk without myelopathy.  11.Cervicalgia.  12.Pain in joints in both upper and lower extremities.  13.Osteoarthritis.  14.Hypothyroidism.  15.Hypoparathyroidism.  16.Anemia.  17.Anxiety.  18.External hemorrhoids.  19.Allergic rhinitis.  20.Gastroesophageal reflux disease.  21.History of constipation.  22.History of insomnia.  23.Status post bilateral knee replacement.  24.Hyperparathyroidism.  25.History of chronic cholecystitis, status post lap cholecystectomy,      August 15, 2008 per Dr. Freida Busman.  26.History of bilateral cataract surgery.  27.ACE joint arthritis.  28.Rotator cuff tear status post repair August 24, 2007 per Dr.      Luiz Blare.  29.Status post left carpal tunnel release and trigger finger release,      September 05, 2006 per Dr. Luiz Blare.  30.Tardive dyskinesia.  31.Depression.  32.Benign prostatic hypertrophy.  33.Gastroesophageal reflux disease.  34.Gastritis.  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   HOME MEDICATIONS:  1. Silvadene cream apply to affected buttocks area q.h.s. until      resolved.  2. Wellbutrin XL 150 mg 3 tablets p.o. q.a.m.  3. Calcitriol 0.5 mcg p.o. daily.  4. Calcium 600 mg with vitamin D p.o. t.i.d.  5. Fish oil 1000 mg p.o. daily.  6. Flonase 50 mcg 2 sprays to each nostril daily.  7. Lasix 40 mg p.o. daily.  8. HCTZ 12.5 mg p.o. daily.  9. Synthroid 200 mcg p.o. daily.   10.Claritin 10 mg p.o. daily.  11.Lopressor 25 mg p.o. daily.  12.Plavix 75 mg p.o. daily.  13.MiraLax 17 grams in 8 ounces of water p.o. daily.  14.Potassium chloride 20 mEq p.o.  15.Flomax 0.4 mg p.o. daily.  16.Vitamin D 400 units p.o. daily.  17.Lodine 400 mg p.o. b.i.d.  18.Gemfibrozil 600 mg p.o. b.i.d.  19.Lyrica 75 mg p.o. t.i.d.  20.Zantac 150 mg p.o. b.i.d.  21.Restoril 30 mg p.o. q.h.s.  22.Nystatin with triamcinolone cream, apply to affected areas as      needed for rash.  23.Tylenol #3 p.o. q.6h. p.r.n. pain.  24.Imodium 2 mg 1 tablet by mouth after each loose stool p.r.n.      diarrhea, no more than 8 tablets per day.  25.Tessalon Perles 1 to 2 capsules p.o. t.i.d. p.r.n. cough.  26.Klonopin 0.5 mg p.o. b.i.d. p.r.n. anxiety.  27.Colchicine 0.6 mg p.o. q.4-6h. p.r.n. pain.  28.Glycerine lemon swab sticks applied to oral cavity p.r.n. dryness.  29.Vicodin 5/500 p.o. q.4-6h. p.r.n. pain.  30.Lactulose 30 mL p.o. b.i.d. p.r.n. constipation.  31.Nitroglycerin palm spray 0.4 mg p.r.n. chest pain, may repeat x1.  32.Extra Strength Tylenol 500 mg p.o. q.6h. p.r.n. headache.  33.Vicks Vapor rub, apply to chest and throat as needed for      congestion.  34.Tussionex suspension takes 5 mL p.o. q.h.s. p.r.n. cough.  35.__________ 800 mg p.o. q.8h. p.r.n. pain.   SOCIAL HISTORY:  The patient is resident at [facility].  Is a widower x2.  No tobacco use.  No alcohol use.  No IV drug  use.   FAMILY HISTORY:  Noncontributory except father deceased in his 60s from  acute MI and also a family history of colon cancer.   REVIEW OF SYSTEMS:  As per HPI, otherwise negative.   PHYSICAL EXAMINATION:  Temperature 99.1, blood pressure 110/64, pulse of  82, respirations 26, satting 92%.  GENERAL:  The patient is a well-developed, well-nourished obese male in  no apparent distress.  HEENT: Normocephalic, atraumatic.  Pupils equal round and reactive to  light and  accommodation.  Extraocular movements intact.  Oropharynx is  clear.  No lesions, no exudates.  NECK: Supple.  No lymphadenopathy.  Dry mucous membranes.  RESPIRATORY:  Lungs are clear to auscultation bilaterally.  No wheezes,  no crackles.  No rhonchi.  CARDIOVASCULAR: Regular rate and rhythm.  No murmurs, rubs or gallops.  ABDOMEN: Soft, obese, nontender, nondistended.  Positive bowel sounds.  EXTREMITIES: No clubbing, cyanosis or edema.  NEUROLOGIC: The patient is alert and oriented x3.  Cranial nerves II-XII  are grossly intact.  Visual fields are intact, 5/5 right upper  and right  lower extremity strength, 5/5 left upper extremity strength, 4/5 left  lower extremity strength.  Finger-to-nose is intact.  Babinski is  unequivocal.  Sensation is intact.  Unable to elicit reflexes diffusely.  Gait not tested secondary to safety.   ADMISSION LABS:  CBC:  White count 7.3, hemoglobin 12.9, hematocrit 38.4  and a platelet count of 230, ANC of 5.4.  CMET with a sodium of 141,  potassium 4.6, chloride 95, bicarb 36, glucose 108, BUN 32, creatinine  1.92, bilirubin of 0.8, alk phosphatase 71, AST 24, ALT 12, protein 6.7,  albumin 3.18, calcium 8.8.  UA was yellow-clear, specific gravity 1.013,  pH of 5.5, glucose negative, bilirubin moderate, ketones negative, blood  negative, protein negative, urobilinogen 0.2, nitrite negative,  leukocytes negative.   Chest x-ray shows stable cardiomegaly and left basilar scarring, no  acute chest findings.   Head CT without contrast shows no acute abnormality, chronic  microvascular ischemic disease.   ASSESSMENT AND PLAN:  [name] [name] is a 67-year of gentleman,  history of hypertension, coronary artery disease, hyperlipidemia,  gastroesophageal reflux disease, chronic renal insufficiency,  Alzheimer's dementia, presenting to the ED with aphasia and slurred  speech.  1. 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   Will admit the patient to telemetry for CVA workup,      check an MRI/MRA of the head, check carotid Dopplers.  Check a 2-D      echo.  Check cardiac enzymes q.8h. x3.  Check a fasting lipid      panel.  Check an EKG.  Check a hemoglobin A1c.  Check a homocystine      level.  Check coags, PT, OT, speech therapy.  Continue home-dose      Plavix.  We will also add aspirin to regimen.  Will consult      neurology in the morning for further evaluation and      recommendations.  2. Dehydration.  Hydrate with IV fluids.  3. Acute-on-chronic renal insufficiency, last creatinine of 1.380 in      March 2010.  This is likely secondary to a prerenal azotemia in the      setting of diuretic use.  Will check a fractional excretion of      sodium.  Will place on IV fluids and will hold Lasix and HCTZ and      the blood pressure medications.  If no improvement, will check a      renal ultrasound.  4. Hypertension.  Hold blood pressure medications.  5. Alzheimer's dementia.  6. History of coronary artery disease, stable.  Continue fish oil,      Plavix, and gemfibrozil  7. Hyperlipidemia.  Continue gemfibrozil and fish oil.  8. Hypothyroidism.  Check a TSH.  Continue home-dose Synthroid.  9. Benign prostatic hypertrophy:  Continue home-dose Flomax.  10.Constipation with MiraLax.  11.Depression.  Continue home-dose Wellbutrin.  12.Gastroesophageal reflux disease:  Protonix.  13.Prophylaxis:  Protonix for GI prophylaxis.  Lovenox for DVT      prophylaxis.   It has been a pleasure taking care of [name] [name].      Ramiro Harvest, MD  Electronically Signed     DT/MEDQ  D:  06/13/2009  T:  06/13/2009  Job:  [id number]   cc:   Tally Joe, M.D.

## 2011-03-05 NOTE — Discharge Summary (Signed)
NAMEGRANT, SWAGER               ACCOUNT NO.:  [id number]   MEDICAL RECORD NO.:  [id number]          PATIENT TYPE:  OIB   LOCATION:  5123                         FACILITY:  [facility]   PHYSICIAN:  Lennie Muckle, MD      DATE OF BIRTH:  09-09-1941   DATE OF ADMISSION:  08/15/2008  DATE OF DISCHARGE:  08/16/2008                               DISCHARGE SUMMARY   FINAL DIAGNOSES:  1. Symptomatic cholelithiasis.  2. Chronic cholecystitis.   PROCEDURE:  Laparoscopic cholecystectomy, August 12, 2008, with  intraoperative cholangiogram.   HOSPITAL COURSE:  The patient was kept overnight for pain control and  management given his heart cardiac history, as well as sleep apnea.  He  had had no issues during the postoperative period.  He had slept and had  good pain control.  He has only mild occasional pain in the right side.  He has been afebrile.  He is eating without difficulty and his vital  signs are stable.   EXAMINATION:  He has mild ecchymosis at his umbilical incision.  No  evidence of erythema and no drainage.   ASSESSMENT AND PLAN:  Status post laparoscopic cholecystectomy.  The  patient is now ready for discharge.  I instructed him he could shower  today.  No heavy lifting greater than 20 pounds.  She should follow up  with me in approximately 2 or 3 weeks.  I told him to bathe after 10  days.  He should be able to eat whatever foods he wants.  We did discuss  if he does have loose stools or problems with his diet he should monitor  and see if certain foods such as fatty foods irritate this.  Otherwise,  is ad lib on his diet.  He has been given Percocet for pain 5/325 one to  two every 4 hours as needed.  He is to continue all of his home  medications.  He should call for fevers or chills or new onset of right  upper quadrant pain.      Lennie Muckle, MD  Electronically Signed     ALA/MEDQ  D:  08/16/2008  T:  08/16/2008  Job:  [id number]   cc:   Tally Joe, M.D.

## 2011-03-05 NOTE — Op Note (Signed)
[name], [name]               ACCOUNT NO.:  [id number]   MEDICAL RECORD NO.:  [id number]          PATIENT TYPE:  OIB   LOCATION:  5014                         FACILITY:  [facility]   PHYSICIAN:  Harvie Junior, M.D.   DATE OF BIRTH:  1941-03-06   DATE OF PROCEDURE:  08/24/2007  DATE OF DISCHARGE:                               OPERATIVE REPORT   PREOPERATIVE DIAGNOSIS:  Impingement, acromioclavicular joint arthritis  and suspected rotator cuff tear.   POSTOPERATIVE DIAGNOSES:  1. Impingement.  2. Acromioclavicular joint arthritis.  3. Superior labral tear anterior to posterior with loose biceps      tendon.  4. Massive rotator cuff tear.   PROCEDURES:  1. Anterolateral acromioplasty from the lateral posterior compartment.  2. Distal clavicle resection through anterior compartment.  3. Tenolysis of the long head of the biceps tendon.  4. Debridement of massive rotator cuff tear and superior labrum.   SURGEON:  Harvie Junior, M.D.   ASSISTANT:  Marshia Ly, P.A.   ANESTHESIA:  General.   BRIEF HISTORY:  [name] is a 70year-old gentleman with a history of  having had significant right shoulder pain.  We treated him  conservatively for a long period of time.  Injection therapy had helped  but his pain persisted.  Because of the significant continued complaints  of pain the patient was taken to the operating room for debridement of  the shoulder and repair as needed.   PROCEDURE:  The patient taken to operating room.  After adequate  anesthesia obtained with general anesthetic, the patient placed supine  on operating table, moved to beach chair position.  All attention was  turned to the right shoulder.  The glenohumeral joint was then evaluated  noted to have a significant level of arthritis.  We debrided the  glenoid, we debrided the humeral head, he head significant articular  cartilage wear in these area.  The biceps tendon was then evaluated  noted to sublux some  anteriorly and the superior labrum was loose and  the biceps tendon superior labral complex could be pulled into the wound  to pinch.  At this point the biceps tendon was then released from the  superior labrum and the superior labrum was debrided back to the  glenoid.  Once this was completed attention was turned to rotator cuff  where a massive rotator cuff was identified.  We used a grasper and  tried to grasp the cuff to see if we could pull it laterally and could  not.  At this point attention was turned out of the glenohumeral joint  of the subacromial space and anterolateral acromioplasty was performed  from lateral and posterior compartment, distal clavicle resection over  17 mm from anterior compartment.  Once this was completed attention was  turned towards the glenohumeral joint again and this area where a  subtotal bursectomy and debridement of the thickened and duplicated  portion of the rotator cuff were debrided.  At this point the shoulder  was copiously and  thoroughly irrigated and suctioned dry.  The portals were closed with  bandage.  A sterile compressive dressing was applied.  The patient taken  to recovery room where she was noted in satisfactory condition.  The  estimated blood loss for this procedure was none.      Harvie Junior, M.D.  Electronically Signed     JLG/MEDQ  D:  08/24/2007  T:  08/25/2007  Job:  [id number]

## 2011-03-08 NOTE — Discharge Summary (Signed)
NAMETRAEGER, SULTANA               ACCOUNT NO.:  [id number]   MEDICAL RECORD NO.:  [id number]          PATIENT TYPE:  ORB   LOCATION:  4532                         FACILITY:  [facility]   PHYSICIAN:  Ranelle Oyster, M.D.DATE OF BIRTH:  May 14, 1941   DATE OF ADMISSION:  08/18/2004  DATE OF DISCHARGE:  08/29/2004                                 DISCHARGE SUMMARY   DISCHARGE DIAGNOSES:  1.  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  2.  Pain management.  3.  Coumadin for deep vein thrombosis prophylaxis.  4.  Anemia.  5.  Alzheimer's disease.  6.  Hypothyroidism.  7.  Coronary artery disease.  8.  Hyperparathyroidism.  9.  Chronic dyspnea.   HOSPITAL COURSE:  This is a 70 year old white male admitted August 15, 2004  with end-stage changes of the left knee and no relief with conservative  care.  Underwent a left total knee replacement August 15, 2004 per Dr. Jodi Geralds.  Placed on Coumadin for deep vein thrombosis prophylaxis.  Weightbearing as tolerated.  Postoperative pain control with PCA  discontinued.  No chest pain, no nausea or vomiting.  Admitted for a  subacute care rehab program.   PAST MEDICAL HISTORY:  1.  Hypothyroidism.  2.  Coronary artery disease.  3.  Hyperparathyroidism.  4.  Alzheimer's disease.  5.  Chronic dyspnea.  6.  Bilateral cataract surgery.   SOCIAL HISTORY:  Lives at [facility] assisted living facility x 1 year.  Used a walker prior to admission.   ALLERGIES:  None.   MEDICATIONS PRIOR TO ADMISSION:  Lopressor; Lopid; Paxil; Aricept;  Trinsicon; calcitriol; Plavix; and Synthroid.  Doses were not made  available.   HOSPITAL COURSE:  Patient with progressive gains while on rehab services,  with therapies initiated daily.  The following issues were followed during  the patient's rehab course.  Pertaining to Mr. Ozimek's left total knee  replacement, surgical site healing nicely.   Neurovascular sensation remained  intact.  He was ambulating with a walker, weightbearing as tolerated.  He  was supervision for bed mobility and transfers, supervision 200 feet with a  rolling walker, minimal assistance for car transfers, still needing little  assistance for lower body activities of daily living.  He would follow up  with Dr. Jodi Geralds as advised.  Pain management ongoing, with good results  on OxyContin sustained release 10 mg q.12 h.  This would be tapered at  discharge.  He was using oxycodone for breakthrough pain.  He remained on  Coumadin for deep vein thrombosis prophylaxis, with latest INR of 2.3.  He  would complete Coumadin protocol.  He would resume his Plavix therapy for  history of coronary artery disease after Coumadin completed.  Postoperative  anemia was stable, with latest hemoglobin of 9.2.  He was transfused 2 units  of packed red blood cells on August 19, 2004 for a hemoglobin of 7.6.  This  improved to 8.8, and ultimately to 9.2 with iron supplement.  He remained on  his hormone  supplement for hypothyroidism.  He had no bowel or bladder  disturbances throughout his rehab course.  He made excellent overall  strides, and plan was to be discharged back to [facility] assisted  living on August 29, 2004.   Latest labs showed an INR of 2.3.  Hemoglobin 9.2, hematocrit 26.6,  platelets 385,000.  Sodium 140, potassium 4, BUN 17, creatinine 1.  Hemoccult stools were all negative.   DISCHARGE MEDICATIONS AT TIME OF DICTATION:  1.  Coumadin, with latest dose 7.5 mg, although this would be adjusted      accordingly, with Coumadin to be completed per protocol, September 15, 2004.  2.  Synthroid 225 mcg p.o. daily.  3.  Calcitriol 0.5 mcg p.o. daily.  4.  Protonix 40 mg p.o. daily.  5.  Lopressor 25 mg p.o. b.i.d.  6.  Lopid 600 mg p.o. b.i.d.  7.  Aricept 10 mg p.o. at bedtime.  8.  Paxil 40 mg p.o. daily.  9.  Os-Cal 500 mg 1 tablet p.o.  b.i.d.  10. Trinsicon 1 capsule b.i.d.  11. OxyContin sustained release 10 mg daily x 1 week, and discontinue, given      in the a.m.  12. Oxycodone immediate release 5 mg 1-2 tablets q.4-6 h. as needed for      pain.   ACTIVITY:  As tolerated.   DIET:  Regular.   WOUND CARE:  Cleanse incision daily, warm soap and water.   SPECIAL INSTRUCTIONS:  Home health nurse per home health agency to complete  Coumadin protocol.  The patient should follow up with Dr. Jodi Geralds,  orthopedic service, as advised.  Patient and assisted living facility were  advised to resume Plavix therapy after Coumadin completed.       DA/MEDQ  D:  08/28/2004  T:  08/28/2004  Job:  [id number]   cc:   Ranelle Oyster, M.D.  [address] [place]  [place] [address]  Fax: [phone]   Harvie Junior, M.D.  [address]  [place]  [place] [address]  Fax: [phone]   Meredith Staggers, M.D.  [address]  [place]  [place] [address]  Fax: [phone]

## 2011-03-08 NOTE — Procedures (Signed)
NAME:  [name] [name], [name] [name]               ACCOUNT NO.:  [id number]   MEDICAL RECORD NO.:  [id number]          PATIENT TYPE:  OUT   LOCATION:  SLEEP CENTER                 FACILITY:  [facility]   PHYSICIAN:  [place] D. Maple Hudson, M.D. DATE OF BIRTH:  [birth year]   DATE OF STUDY:  11/13/2005                              NOCTURNAL POLYSOMNOGRAM   REFERRING PHYSICIAN:  Dr. Tally Joe.   DATE OF STUDY:  November 13, 2005.   INDICATION FOR STUDY:  Hypersomnia with sleep apnea.   EPWORTH SLEEPINESS SCORE:  4/24.   BMI:  36.   WEIGHT:  301 pounds.   A baseline diagnostic NPSG on October 02, 2005 had reported an AHI 96.5 per  hour. C-PAP titration is requested. Medication list reviewed.   SLEEP ARCHITECTURE:  Total sleep time 305 minutes with sleep efficiency 65%.  Stage I was 10%, stage II 90%, stages III and IV were absent, REM absent.  Sleep latency 84 minutes, awake after sleep onset 76 minutes, arousal index  27.9. Trazodone was taken at 9:54 p.m.   RESPIRATORY DATA:  C-PAP was titrated to 18 CWP, AHI 0.9 per hour. Several  masks were tried. He had arrived bringing a large ResMed full-face mask  which he stated hurt the bridge of his nose. He finally settled on a large  Fisher-Paykel full-face mask as the best of all that he had tried. A heated  humidifier was used.   OXYGEN DATA:  Snoring was prevented by final C-PAP pressures.   CARDIAC DATA:  Normal sinus rhythm.   MOVEMENT/PARASOMNIA:  A total of 466 limb jerks were reported of which 66  were associated with arousal or awakening for periodic limb movement with  arousal index of 13 per hour which is increased.   IMPRESSION/RECOMMENDATIONS:  1.  Successful C-PAP titration to 18 CWP, AHI 0.9 per hour. A large Fisher-      Paykel mask was settled on as the most comfortable or least      uncomfortable of all that he had tried. A heated humidifier was used.  2.  Baseline diagnostic NPSG on October 02, 2005 reported an AHI 96.5 per  hour.  3.  Periodic limb movement with arousal, 13 per hour. Note that tricyclic's      and sometimes other antidepressants and other medications may increase      the frequency of leg jerks. Given his difficulties achieving comfort      with      his C-PAP mask, a specific sedative hypnotic sleep medication might be      more helpful depending on the clinical circumstances.      [place] D. Maple Hudson, M.D.  Diplomate, Biomedical engineer of Sleep Medicine  Electronically Signed     CDY/MEDQ  D:  11/17/2005 12:31:49  T:  11/18/2005 02:14:06  Job:  [id number]

## 2011-03-08 NOTE — Discharge Summary (Signed)
[name] [name], [name] [name]               ACCOUNT NO.:  [id number]   MEDICAL RECORD NO.:  [id number]          PATIENT TYPE:  INP   LOCATION:  5005                         FACILITY:  [facility]   PHYSICIAN:  Harvie Junior, M.D.   DATE OF BIRTH:  11-24-1940   DATE OF ADMISSION:  08/15/2004  DATE OF DISCHARGE:  08/18/2004                                 DISCHARGE SUMMARY   ADMISSION DIAGNOSES:  1.  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  2.  History of organic brain syndrome.  3.  Osteoarthritis.   DISCHARGE DIAGNOSES:  1.  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  2.  History of organic brain syndrome.  3.  Osteoarthritis.   PROCEDURES IN HOSPITAL:  Computer-assisted left total knee arthroplasty.   CONSULTATIONS IN HOSPITAL:  None.   BRIEF HISTORY:  Mr. Veasey is a 70 year old patient who has moderate organic  brain syndrome who has a long history of pain in his left knee associated  with weightbearing.  He began having difficulty ambulating because of pain  in his knee.  He has night pain and did not get improvement with exhaustive  conservative management.  Weightbearing x-rays of the left knee showed bone-  on-bone changes and based upon his clinical and radiographic findings, he  was felt to be a candidate for a left total knee arthroplasty and this was  performed using computer assistance in the operating room.   PERTINENT LABORATORY STUDIES:  EKG on admission showed normal sinus rhythm  with low voltage QRS.  Chest x-ray on August 17, 2004 showed increasing  atelectasis or infiltrate in the left lower lobe.  Limited exam with motion  artifact.  Hemoglobin on admission was 13.5, hematocrit 39.0. On  postoperative day #1, hemoglobin was 10.2, #2 8.2, #3 8.5, #4 8.3.  Pro time  on admission was 12.9 seconds with an INR of 1.0.  PTT was 32.  On the date  of discharge, his pro time was 16.6 seconds with an INR of 1.5.  CMET on  admission shows a slightly elevated  potassium at 5.2, otherwise within  normal limits.  BMET on postoperative day #1 was unremarkable with only a  slightly elevated BUN and sodium of 133.  Urinalysis on admission was  unremarkable.   HOSPITAL COURSE:  The patient underwent left total knee arthroplasty  computer-assisted on August 15, 2004 as well described in Dr. Luiz Blare'  operative note.  Postoperatively, he was put on a PCA morphine pump for pain  control, Ancef IV 1 gram q.8h. x4 doses and he was started on Coumadin and  thrombolytic therapy per pharmacy protocol.  The CPM machine was used for  range of motion of the knee.  On postoperative day #1, he was without  complaints other than moderate pain.  His laboratory data was in acceptable  limits.  He has gotten up with physical therapy, weightbearing as tolerated.  A rehabilitation consult was obtained and he was felt to possibly be a  candidate for a subacute care unit.  On postoperative day #2, his hemoglobin  was 8.7.  His  dressing was changed and his Hemovac drain was pulled.  He  made good progress with physical therapy.  On postoperative day #3, he was  progressing somewhat slowly in therapy.  His hemoglobin was 8.3.  His INR  was 1.5.  He spiked a fever up to 101.6, was then found to be afebrile.  His  left knee dressing was clean and dry.  He was then discharged to the  subacute unit.   MEDICATIONS AT DISCHARGE:  1.  Percocet 5 mg one to two q.4-6h. p.r.n. pain.  2.  Coumadin per pharmacy protocol.  3.  His usual home medications which include:  Lopid 600 mg one b.i.d.  4.  Paxil 40 mg one daily.  5.  Aricept 10 mg p.o. q.h.s.  6.  Aspirin one daily.  7.  Plavix was discontinued.  8.  Calcitriol 0.5 mg one p.o. daily.  9.  Vitamin D 1.25 mg daily.   ACTIVITY:  He will be weightbearing as tolerated on the left with a walker.  He will continue with CPM machine and physical therapy.  We will follow him  while on the subacute unit.      JB/MEDQ  D:   10/26/2004  T:  10/26/2004  Job:  [id number]   cc:   Meredith Staggers, M.D.  [address]  [place]  [place] [address]  Fax: [phone]

## 2011-03-08 NOTE — H&P (Signed)
[name] [name], [name] [name]                           ACCOUNT NO.:  [id number]   MEDICAL RECORD NO.:  [id number]                   PATIENT TYPE:  INP   LOCATION:  1825                                 FACILITY:  [facility]   PHYSICIAN:  Deirdre Peer. Polite, M.D.              DATE OF BIRTH:  03-Mar-1941   DATE OF ADMISSION:  10/02/2003  DATE OF DISCHARGE:                                HISTORY & PHYSICAL   PRIMARY CARE PHYSICIAN:  Meredith Staggers, M.D.   CHIEF COMPLAINT:  Per assisted living facility, confusion.   HISTORY OF PRESENT ILLNESS:  [name] with a history of  hypothyroidism status post thyroidectomy who was brought to the ED for  increasing confusion over four weeks.  Over the last few days, the patient's  confusion has worsened.  An admission evaluation in the ED was recommended.  Per the assisted living facility, the patient has a very poor p.o. intake  and appears very agitated.  Of note prior to presenting to the ED the  patient had been up all night cleaning his room with toilet paper.  There  has been no reports of any fever or chills, no nausea and vomiting, no  diarrhea or constipation, no blood in the stool, no complaints of chest pain  or shortness of breath.  The patient has been noted to have conversations  that appeared to not make much sense.  He has not been noted to have any  limb weakness or slurred speech.  Because of the above, subsequent  evaluation in the ED was recommended.  In the ED the patient was seen, had a  CT of his head without acute change.  Labs, CBC within normal limits.  BMET  significant for a creatinine of 1.7.  Clinically the patient appeared  dehydrated and BP was low at 72/45.  Because of the above, admission is  recommended for further evaluation and treatment.   PAST MEDICAL HISTORY:  1. Hypothyroidism.  2. Hypoparathyroidism.  3. Dyslipidemia.  4. Coronary artery disease.  5. Tardive dyskinesia.  6. Arthritis.  7. Chronic  dyspepsia.  8. Cowden's syndrome.   MEDICATIONS:  1. Cogentin 0.5 mg p.o. b.i.d.  2. Ergocalciferol 50,000 units p.o. daily.  3. Levothyroxine 300 mcg p.o. q.h.s.  4. Caltrate Plus two tabs b.i.d.  5. Multivitamin daily.  6. Protonix 40 mg daily.  7. Aspirin 81 mg p.o. daily.  8. Lamisil 250 mg daily.  9. Paxil 40 mg daily.  10.      Celebrex 200 mg daily.  11.      Calcitrol 0.5 mcg daily.  12.      Metoprolol 25 mg b.i.d.  13.      Lopid 200 mg b.i.d.  14.      Os-Cal +D three tabs twice a day.  15.      Tylenol p.r.n.  16.      Pred  Forte 1% one drop to the left eye q.i.d.  17.      Zymar 0.3% one drop to the left eye q.i.d.   SOCIAL HISTORY:  Per daughter negative for tobacco, alcohol, or drugs.   PAST SURGICAL HISTORY:  Significant for thyroidectomy also  parathyroidectomy.   ALLERGIES:  No known drug allergies.   FAMILY HISTORY:  Noncontributory.   REVIEW OF SYSTEMS:  As stated in the HPI.   PHYSICAL EXAMINATION:  GENERAL:  The patient is alert to self, pleasantly  confused.  VITAL SIGNS:  BP 105/67.  HEENT:  Pupils equal, round, reactive to light, anicteric sclerae.  The  patient has very dry oral mucosa with papillary-appearing lesions on his  tongue and buccal mucosa.  NECK:  No thyromegaly and there is a surgical scar at the base of his neck  from most likely parathyroidectomy.  CHEST:  Clear to auscultation bilaterally.  CARDIOVASCULAR:  Regular.  No S3.  ABDOMEN:  Soft, nontender, no hepatosplenomegaly.  EXTREMITIES:  No clubbing, cyanosis, or edema.  RECTAL:  Deferred.  NEUROLOGICAL:  Grossly intact.  Cranial nerves II-XII intact.  5/5 motor.  Gait not tested.   LABORATORY DATA:  The patient had a CBC.  White count 7.3, hemoglobin 11.7,  platelets 256,000.  INR of 1.2.  BMET:  Sodium 142, potassium 3.7, chloride  106, carbon dioxide 26, BUN 28, creatinine 1.7, AST and ALT 21 and 11  respectively.  Total bilirubin 0.7.  The patient's UA is  unremarkable.   EKG normal sinus rhythm.  Nonspecific T-wave abnormality.   ASSESSMENT AND PLAN:  1. Mental status change.  Differential diagnosis:  Progressive dementia     versus psychosis versus an occult infection.  So far by exam no obvious     infection has been identified.  Will check a TSH, check B12, folate, and     psych evaluation.  2. Dehydration:  Clinically, the patient appears dehydrated.  Creatinine is     1.7.  It is unknown what the patient's baseline creatinine is.  Specific     gravity on the patient's urinalysis is actually within normal limits.     Will give intravenous fluids.  Will hold Celebrex that the patient has     been receiving on an outpatient basis and further evaluation.  3. Hypotension:  Most likely multifactorial on the basis of dehydration and     meds.  Will hold blood pressure medication, Lopressor and follow up after     adequate hydration.  4. Hypothyroid:  Check a TSH.  5. History of hypoparathyroid:  Most likely the parathyroids were removed or     damaged during the prior thyroid surgery.  Will check an iodized calcium.     Continue outpatient meds.  6. History of Cowden's syndrome.  This is the diagnosis that the patient     carries upon admission which appears to     be a rare although somewhat dominant condition characterized by multiple     hematoma lesions and increased risks of breast and thyroid cancer.     Therefore, it is proper to assume that the patient has had thyroid cancer     in the past.                                                Deirdre Peer. Polite,  M.D.    RDP/MEDQ  D:  10/02/2003  T:  10/02/2003  Job:  [id number]   cc:   Meredith Staggers, M.D.  [address]  [place]  [place] [address]  Fax: [phone]

## 2011-03-08 NOTE — Op Note (Signed)
[name] [name] [name], [name] [name]               ACCOUNT NO.:  [id number]   MEDICAL RECORD NO.:  [id number]          PATIENT TYPE:  INP   LOCATION:  5013                         FACILITY:  [facility]   PHYSICIAN:  Harvie Junior, M.D.   DATE OF BIRTH:  05-14-1941   DATE OF PROCEDURE:  02/12/2006  DATE OF DISCHARGE:                                 OPERATIVE REPORT   PREOPERATIVE DIAGNOSIS:  End-stage degenerative joint disease right knee.   POSTOPERATIVE DIAGNOSIS:  End-stage degenerative joint disease right knee.   PRINCIPAL PROCEDURES:  1.  Right total knee replacement with Sigma system, size 6 femur, size 6      tibia, 10 mm bridging bearing, 41 mm all-polyethylene patella.  2.  Computer-assisted knee replacement.   SURGEON:  Harvie Junior, M.D.   ASSISTANT:  [name] [name] [name] [name] [name], P.A.-C., who was present for the entire case.   INDICATIONS:  [name] with a long history of having had  significant bilateral degenerative joint disease of the knee.  We had  treated him previously with a left total knee replacement.  He had done  well.  We had used computer assistance on that because of his large size and  need to improve alignment for longevity, and he is brought to the operating  room for a total knee replacement with computer assistance, again because of  concerns over long-term outcome, wanting perfect alignment.   BRIEF HISTORY:  He is a 70 year old male with a long history of bilateral  degenerative joint disease.  He has failed all conservative care, and  because of complaints of continued pain he is brought to the operating room  for total knee replacement.  Computer assistance is used, as outlined above,  because of his large size and need to try to prolong the longevity of the  implant.   PROCEDURE:  The patient was brought to the operating room.  After adequate  anesthesia was obtained  with general anesthesia, the patient was placed on  the operating table.  The right leg was  prepped and draped in the usual  sterile fashion.  Following this, a midline incision was made, and the  subcutaneous tissues were taken down to the level of the extensor mechanism.  The medial parapatellar arthrotomy was then undertaken, and the anterior and  posterior cruciates were then incised.  Medial and lateral meniscectomy,  with the skillful assistance of [name] [name] [name] [name], who is a physician's  assistant specifically trained in the management of total joint patients.  At this time, retractors were put in place, and the computer assistance  alignment was set up.  This certainly added 30 minutes to the case, and the  RVU certainly would be much greater because of the computer assistance or a  separate module for computer assistance.  At any rate, once the computer  module was taken, registration was undertaken, and once this was done, the  tibia was cut perpendicular to the long axis with computer assistance as  well.  At this point, the gaps were set via the computer after a tensiometer  was used, and  a plan was made.  In this case, the computer was very helpful  because the flexion gap was smaller than the extension gap which is a little  bit unusual and, because of this, plans needed to be made to shift the  component anteriorly.  All of this is going to help to maintain the  longevity of this component.  At any rate, once the overall plan was set,  the computer assistance was used for rotational alignment, and then the  placement of the jig for anterior and posterior cuts and chamfers.  The box  cut was then made and, following this, attention was turned to the tibia.  The tibia was sized to a 5.  It was locked in place, and then the keel was  drilled, and then the keel was placed.  Following this, the patella was  measured.  A 41 mm all-polyethylene patella was chosen.  At this point, the  patella was cut down to 15 mm from its original size of 29 mm.  We went 3  extra  millimeters because were anteriorized the patellar component 3 mm to  improve the flexion gap, and this was a direct result of using the computer  intraoperatively.  [name] [name] [name] [name] [name] was assisting throughout this, helped to hold  jigs in place, and his skillful assistance allowed the case to run more  smoothly.  Once the patella was cut, the final trial component was put in  place, and then we put it through a range of motion.  Excellent range of  motion was achieved.  Computer assistance at this point showed neutral long  alignment, perfectly balanced flexion and extension gaps, and the patella  tracked perfectly midline.  At this point, the trial components were  removed.  The final components were cemented into place after the knee was  copiously irrigated and suctioned dry and all the __________ of bone were  cleansed, and once this was done the final components were cemented into  place.  Excellent fixation was achieved throughout, and once this was  accomplished the knee was put through a range of motion.  No instability.  Perfect midline alignment.  Computer showed perfect neutral alignment and  perfect gap balance.  At this point, the computer modules were removed, and  the tourniquet was let down.  The bleeding was controlled with  electrocautery, and the medium Hemovac drain was put in place.  Medial  parapatellar arthrotomy was closed with a 1 Vicryl running suture, the skin  with 0 and 2-0 Vicryl and skin staples.  Sterile compressive dressing was  applied, as well as a knee immobilizer, and the patient was taken to the  recovery room and was noted to be in satisfactory condition.  Estimated  blood loss for the procedure was 400 cc.      Harvie Junior, M.D.  Electronically Signed     JLG/MEDQ  D:  02/12/2006  T:  02/13/2006  Job:  [id number]

## 2011-03-08 NOTE — Op Note (Signed)
NAME:  CARLSON, BELLAND               ACCOUNT NO.:  [id number]   MEDICAL RECORD NO.:  [id number]          PATIENT TYPE:  AMB   LOCATION:  ENDO                         FACILITY:  [facility]   PHYSICIAN:  James L. Malon Kindle., M.D.DATE OF BIRTH:  1941/07/02   DATE OF PROCEDURE:  04/04/2005  DATE OF DISCHARGE:                                 OPERATIVE REPORT   PROCEDURE:  Colonoscopy.   ENDOSCOPIST:  Llana Aliment. Edwards, M.D.   MEDICATIONS:  Fentanyl 60 mcg, Versed 6 mg IV.   SCOPE:  Olympus pediatric adjustable scope.   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   DESCRIPTION OF PROCEDURE:  The procedure had been explained to the patient  and consent obtained.  With the patient in the left lateral decubitus  position, the Olympus scope was inserted and advanced.  under direct  visualization.  The prep was marginal but adequate.  We were able to reach  the cecum.  The ileocecal valve and appendical orifice were seen.  The scope  was withdrawn.  The colon was carefully examined on withdrawal.  No  abnormalities seen, no polyps seen, and no diverticular disease.  The scope  was withdrawn.  The patient tolerated the procedure well.   ASSESSMENT:  Family history of colon cancer and a gentlemen with Cowen's  syndrome.  Negative colonoscopy at this time, V16.0.   PLAN:  Will recommend yearly Hemoccults, repeat colonoscopy in five years.       JLE/MEDQ  D:  04/04/2005  T:  04/04/2005  Job:  [id number]   cc:   Tally Joe, M.D.  [address] [place] [address]  [place], [place] [address]  Fax: [phone]

## 2011-03-08 NOTE — Discharge Summary (Signed)
[name] [name], [name] [name]               ACCOUNT NO.:  [id number]   MEDICAL RECORD NO.:  [id number]          PATIENT TYPE:  INP   LOCATION:  5013                         FACILITY:  [facility]   PHYSICIAN:  Harvie Junior, M.D.   DATE OF BIRTH:  [birth year]   DATE OF ADMISSION:  02/12/2006  DATE OF DISCHARGE:  02/18/2006                                 DISCHARGE SUMMARY   ADMITTING DIAGNOSES:  1.  End stage degenerative joint disease, right knee.  2.  Status post left total knee replacement in 2005.  3.  Cowden syndrome.  4.  Hypothyroidism.  5.  Hypoparathyroidism.  6.  Osteoarthritis.  7.  Hypertension.  8.  Tardive dyskinesia.  9.  Depression.  10. Benign prostatic hypertrophy.   DISCHARGE DIAGNOSES:  1.  End stage degenerative joint disease, right knee.  2.  Status post left total knee replacement in 2005.  3.  Cowden syndrome.  4.  Hypothyroidism.  5.  Hypoparathyroidism.  6.  Osteoarthritis.  7.  Hypertension.  8.  Tardive dyskinesia.  9.  Depression.  10. Benign prostatic hypertrophy.  11. Postoperative blood loss anemia.   PROCEDURES IN-HOSPITAL:  Right total knee arthroplasty, computer-assisted,  Harvie Junior, M.D., February 12, 2006.   BRIEF HISTORY:  [name] [name] is a 70 year old patient who lives at the [facility] and has a long history of right knee pain  secondary to advanced osteoarthritis.  He has night pain and pain with  ambulation, essentially pain with every step.  X-rays of the right knee  showed bone-on-bone degenerative arthritis.  He only got temporary relief  with conservative treatment which included modification of activity,  medication, and use of steroid injections.  Based upon his radiographic and  clinical findings, he was admitted for a total knee replacement on the right  side.   PERTINENT LABORATORY STUDIES:  Chest x-ray done prior to this admission, on  January 23, 2006, showed resolving bibasilar infiltrate, minimal  residual  infiltrate noted left base, heart mildly enlarged, chronic appearing  elevation of the left hemidiaphragm.  Hemoglobin, on admission, was 14.0,  hematocrit 41.0, WBC 7.7.  On post-op day one, hemoglobin was 10.9, number  two is 10.1, and on February 17, 2006, his hemoglobin was 8.7 with hematocrit  of 26.2.  Pro time on admission was 13 seconds with an INR of 1.0, PTT was  37.  On the date of discharge, Feb 18, 2006, his pro time was 30.9 seconds  with an INR of 3.0.  C-MET, on admission, showed no abnormalities.  Calcium  slightly decreased at 7.6.  B-MET, on post-op day #1, was within normal  limits.  EKG, on admission, showed a normal sinus rhythm with a low voltage  QRS, borderline EKG.   HOSPITAL COURSE:  The patient was given IV gentamicin and Ancef  preoperatively.  He was then taken to the operating room, where he underwent  right total knee arthroplasty computer-assisted as well described in Dr.  Luiz Blare operative note.  Postoperatively, he was started on Lovenox for DVT  prophylaxis as well as  Coumadin orally.  He was continued on all of his home  medications with the exception of Plavix.  PCA morphine pump was used for  pain control.  Physical therapy was ordered and the CPM machine was used.  He was instructed to be weightbearing as tolerated on the right side.  On post-op day #1, he was without significant complaints other than moderate  pain.  His hemoglobin was 10.9.  His INR was 1.1.  He was felt to be stable.  His NV status was intact distally.  Physical therapy worked with the patient  on a daily basis while in the hospital.  On post-op day #2, he had slight  fever up to 101.2.  His hemoglobin was 10.1, INR was 1.2.  His right knee  wound was clean and dry.  Hemovac drain was pulled and the dressing was  changed.  Rehab consult was obtained.  The patient was resting comfortably  on post-op day #3.  He had a fever of 100.6.  His hemoglobin was 8.7.  His  lungs were  clear, and he had no urinary frequency at this point.  He had a  maximum fever of 101.2 on post-op day #4.  His wound was benign.  His INR  was 2.4.  He is continued with physical therapy.  We did not feel that he  needed a transfusion of blood and he was on oral iron during his  hospitalization.  On February 17, 2006, his hemoglobin was 8.7.  He was  continued on Coumadin.  He was felt to be ready to be discharged to the  [facility].  On Feb 18, 2006, the patient was  ready to go home.  He was taking fluids and voiding without difficulty.  His  vital signs were stable.  He was afebrile.  His right knee wound was benign.  His calf was soft.  NV was intact distally.  His calf was nontender.  His  INR was 3.0.   He was discharged home in improved condition.   He was on a regular diet.   He was given a Rx for Percocet 5 mg p.r.n. pain and Coumadin x1 month per  pharmacy protocol.   He was instructed to ambulate weightbearing as tolerated on the right with a  walker.  He will need home health physical therapy three times a week.  Home  CPM eight hours per day, initially starting at 0 degrees to 65 degrees,  increasing up 5 to 10 degrees daily to 90 degrees.  He will need this for  about ten days postoperatively.  He will need home health RN for pro times  and Coumadin management.   He will follow up with Dr. Luiz Blare in the office in 10 days.   ADDITIONAL MEDICATIONS AT THE TIME OF DISCHARGE:  1.  Prilosec 40 mg one every day.  2.  Nasacort two sprays p.r.n.  3.  Lopid 600 mg b.i.d.  4.  Foltrin one b.i.d.  5.  Trazodone 200 mg q.h.s.  6.  Aricept 10 mg p.o. q.h.s.  7.  Synthroid 0.2 mg one every day.  8.  Paxil 40 mg one every day.  9.  Rocaltrol 0.5 mg every day.  10. Flomax 0.4 mg one b.i.d.  11. Wellbutrin 300 mg every day.  12. Metoprolol 25 mg every day.   He will keep his knee wound dry.  Call if any problems occur.      Marshia Ly,  P.A.  Harvie Junior, M.D.  Electronically Signed    JB/MEDQ  D:  02/18/2006  T:  02/18/2006  Job:  [id number]   cc:   Tally Joe, M.D.  Fax: [phone]

## 2011-03-08 NOTE — Procedures (Signed)
NAME:  [name] [name], [name] [name]               ACCOUNT NO.:  [id number]   MEDICAL RECORD NO.:  [id number]          PATIENT TYPE:  OUT   LOCATION:  SLEEP CENTER                 FACILITY:  [facility]   PHYSICIAN:  Clinton D. Maple Hudson, M.D. DATE OF BIRTH:  1941/02/22   DATE OF STUDY:                              NOCTURNAL POLYSOMNOGRAM   REFERRING PHYSICIAN:  Dr. Tally Joe.   DATE OF STUDY:  October 02, 2005.   INDICATION FOR STUDY:  Hypersomnia with sleep apnea.   EPWORTH SLEEPINESS SCORE:  5/24.   BMI:  37.   WEIGHT:  310 pounds.   SLEEP ARCHITECTURE:  Total sleep time short, 209 minutes with sleep  efficiency 48%. Stage I was 36%, stage II 64%, stages III and IV absent, REM  absent. Sleep latency 40 minutes, awake after sleep onset 187 minutes,  arousal index increased at 75.8. The patient took all of his medications  prior to arriving.   RESPIRATORY DATA:  Apnea/hypopnea index (AHI, RDI) 96.5 obstructive events  per hour indicating very severe obstructive sleep apnea/hypopnea syndrome.  There were 2 central apneas, 155 obstructive apneas and 179 hypopneas.  Events were not positional with significant numbers while supine and on left  side. Because he could not maintain sleep sufficiently, he was not able to  qualify for C-PAP titration by split protocol on the study night.   OXYGEN DATA:  Moderate to loud snoring with oxygen desaturation to a nadir  of 80%. Mean oxygen saturation through the study was 90% on room air.   CARDIAC DATA:  Normal sinus rhythm.   MOVEMENT/PARASOMNIA:  Occasional leg jerk with little effect on sleep.  Bathroom x2.   IMPRESSION/RECOMMENDATIONS:  1.  Significant difficulty maintaining sleep.  2.  Severe obstructive sleep apnea/hypopnea syndrome, AHI 96.5 per hour with      nonpositional events, moderate to loud snoring and oxygen desaturation      to 80%.  3.  Consider return for C-PAP titration or evaluate for alternative      therapies as appropriate. It  may help to bring a sleeping pill if return      for C-PAP titration is chosen.      Clinton D. Maple Hudson, M.D.  Diplomate, Biomedical engineer of Sleep Medicine  Electronically Signed     CDY/MEDQ  D:  10/06/2005 17:48:31  T:  10/07/2005 01:07:13  Job:  [id number]

## 2011-03-08 NOTE — Discharge Summary (Signed)
NAMEGLENNIE, [name]                           ACCOUNT NO.:  [id number]   MEDICAL RECORD NO.:  [id number]                   PATIENT TYPE:  INP   LOCATION:  5154                                 FACILITY:  [facility]   PHYSICIAN:  Sherin Quarry, MD                   DATE OF BIRTH:  [birth year]   DATE OF ADMISSION:  10/02/2003  DATE OF DISCHARGE:                                 DISCHARGE SUMMARY   ADDENDUM   Subsequent to my previous dictation of October 06, 2003, prior to the  patient's return to the assisted living facility, patient's family members  requested that he be evaluated by a Neurologist.  He was seen by Dr.  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  Dr. Jeanie Sewer also returned to re-evaluate the patient and concluded  that it might be reasonable to try him on Aricept to see if this would help  his short-term memory.   Therefore, at the time of discharge, in addition to the medications  previously described on the discharge summary, the patient will take:  1. Plavix 75 mg daily.  2. Aricept 5 mg daily.   Whether or not the Aricept is going to help his memory can be assessed over  time.  A reasonable plan would probably be to give him the 5 mg daily dose  for about a month, then increase it to 10 mg daily, and then assess whether  any benefit has occurred after another month after that.                                                Sherin Quarry, MD    SY/MEDQ  D:  10/08/2003  T:  10/08/2003  Job:  [id number]   cc:   Meredith Staggers, M.D.  [address], [address]  [place]  [place] [address]  Fax: [phone]

## 2011-03-08 NOTE — Consult Note (Signed)
[name] [name], [name] [name]                           ACCOUNT NO.:  [id number]   MEDICAL RECORD NO.:  [id number]                   PATIENT TYPE:  INP   LOCATION:  5154                                 FACILITY:  [facility]   PHYSICIAN:  Melvyn Novas, M.D.               DATE OF BIRTH:  [birth year]   DATE OF CONSULTATION:  10/06/2003  DATE OF DISCHARGE:                                   CONSULTATION   HISTORY OF PRESENT ILLNESS:  This 70 year old gentleman was admitted on  October 02, 2003 with mental status changes and possible confusional state.  He has been living in an assisted living facility in spite of fairly young  age and appeared confused at that environment. In the ED, he was evaluated  for possible metabolic or brain organic reasons to have confusion. He has a  history of hypothyroidism and is status post thyroidectomy diagnosed with  Cowden syndrome. The confusion seems to have worsened over four weeks and  was not acute on onset. Admission evaluation by Dr. Dayton Scrape is documented in  the chart. It states that there has been no evidence of any infection,  gastrointestinal or respiratory deficits, hypo or hyperglycemia. The patient  seems to have been hallucinating and have fixed delusions that disrupted the  routine in his assisted care facility. He was not found to have any  neurological or physical abnormalities seen in a general physical  examination. The ED had ordered a CAT scan of the head that showed no acute  abnormalities but shows a right parietal lacune. Lab studies were all within  normal limits except for thyroid hormone and creatinine was found 1.7and not  known condition for this patient. Clinically, he appeared dehydrated, and he  had been hypotension.   PAST MEDICAL HISTORY:  1. Hypothyroidism.  2. Hyperparathyroidism.  3. Dyslipidemia.  4. Coronary artery disease.  5. Tardive dyskinesia.  6. Arthritis.  7. Chronic dyspepsia.  8. Cowden syndrome.   MEDICATIONS  AT HOME:  Cogentin, calciferol, levothyroxine, Cal-Citrate,  multivitamin, Protonix, aspirin, Lamisil, Paxil, Celebrex, calcitriol,  metoprolol, Lopid, and Os-Cal with vitamin D. Tylenol p.r.n. and eye drops,  Prednisone Forte and Zymar.   SOCIAL HISTORY:  According to the patient, negative for tobacco or alcohol.  He lives in an assisted living facility. He is widowed.   PAST MEDICAL HISTORY:  Thyroidectomy and parathyroidectomy as well.   ALLERGIES:  No known drug allergies, but the patient states he does not  tolerate iodine.   FAMILY HISTORY:  Negative for other patient's with Cowden's syndrome.   REVIEW OF SYSTEMS:  Confused, tangential thinking. Irrational behavior.   PHYSICAL EXAMINATION:  GENERAL:  The patient is alert to self. He is  confused, remains pleasant and cooperative.  VITAL SIGNS:  Show a blood pressure of 130/83. His heart rate is 77 and  regular.  LUNGS:  Clear to auscultation.  EXTREMITIES:  Exam shows no enlarged  lymph nodes, clubbing, cyanosis, or  mucous membrane abnormalities.  NEUROLOGICAL:  Mental status:  Tangential local react; not answering direct  questions in a direct fashion. Again, pleasant and cooperative but confused.  No significant aphasia or apraxia. Decreased attention. He cannot sustain  attention very long, drifts off, and increased tendency to be agitated. He  shows also dysfunction with left and right differentiation and to follow  multi step commands but is able to follow motor commands equally on both  sides of his body. Cranial nerves:  Pupils are equal, round, and reactive to  light and accommodation. Full extraocular movements with nystagmus in the  horizontal plane bilaterally. On upward gaze, the patient did not follow  this maneuver; he was able to produce a normal downward gaze. Visual fields  were bilaterally equal to bilateral simultaneous stimulation with finger  telemetry. Right facial weakness is seen, but the patient's eye  closure is  bilaterally equal. He shows most asymmetry in the nasolabial fold. Tongue  and uvula midline. Neck is supple. Motor exam:  Equal tone, mass, and  strength bilaterally. Deep tendon reflexes are equally 1+. Downgoing toes to  plantar stimulation. There is no clonus. Gait:  The patient shows ataxia  limp to the right when I walk with him. He also shows a positive Romberg  with retropulsion. Sensory:  He denies any asymmetry or patchy loss of  primary modalities.   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 Could also be induced by medication, of course.  Sensory is intact to primary modalities, but he shows altered sensorium with  visual and acoustic hallucinations. CT showed unspecific right hemispheric  change.   PLAN:  In concordance with Dr. Anne Hahn with whom I had a short discussion, we  will order a MRI for the brain but no MRA. MRI is ordered with and without  gadolinium to evaluate the patient and also will include a diffusion  weighted image. I suggested further to continue Dr. Providence Crosby regimen of  neuroleptic and antidepressant medications, given that the patient states he  just lost his spouse, and this might also be a major depression grief  reaction. I had not had any evidence of focal neurologic deficits await the  MRI/MRA before further recommendations will be made.                                               Melvyn Novas, M.D.    CD/MEDQ  D:  10/06/2003  T:  10/07/2003  Job:  [id number]

## 2011-03-08 NOTE — Op Note (Signed)
NAME:  [name] [name], [name] [name]               ACCOUNT NO.:  [id number]   MEDICAL RECORD NO.:  [id number]          PATIENT TYPE:  AMB   LOCATION:  ENDO                         FACILITY:  [facility]   PHYSICIAN:  James L. Malon Kindle., M.D.DATE OF BIRTH:  05-09-1941   DATE OF PROCEDURE:  04/04/2005  DATE OF DISCHARGE:                                 OPERATIVE REPORT   PROCEDURE:  Esophagogastroduodenoscopy.   MEDICATIONS:  Fentanyl 60 mcg, Versed 6 mg IV.   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  Done to look for cancer.  He has had a history of both colon and stomach  cancer.   DESCRIPTION OF PROCEDURE:  The procedure had been explained to the patient  and consent obtained.  In the left lateral decubitus position, the Olympus  scope inserted.  The stomach entered, the pylorus identified and passed.  We  went down to the full extent of the scope in the duodenum.  No polyps seen.  Antrum and body normal other than mild gastritis.  No polyps seen in the  stomach.  Fundus and cardia were seen well and were normal.  There was a  hiatal hernia with no signs of reflux.  Distal and proximal esophagus were  normal.  There was mild streaky gastritis in the antrum.  The scope was  withdrawn.  The patient tolerated the procedure well.   ASSESSMENT:  1.  Gastritis, 535.00.  2.  Gastroesophageal reflux, 530.81.  3.  No polyps in upper gastrointestinal tract.   PLAN:  Will proceed with colonoscopy at this time.  Continue the patient on  Prilosec.       JLE/MEDQ  D:  04/04/2005  T:  04/04/2005  Job:  [id number]   cc:   Tally Joe, M.D.  [address] [place] [address]  [place], [place] [address]  Fax: [phone]

## 2011-03-08 NOTE — Op Note (Signed)
NAMEROBINSON, BRINKLEY               ACCOUNT NO.:  [id number]   MEDICAL RECORD NO.:  [id number]          PATIENT TYPE:  INP   LOCATION:  2899                         FACILITY:  [facility]   PHYSICIAN:  Harvie Junior, M.D.   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   PROCEDURE:  Left total knee replacement with computerized navigation.   SURGEON:  Harvie Junior, M.D.   ASSISTANT:  Marshia Ly, P.A.   ANESTHESIA:  General.   COMPONENTS USED:  Sigma system with a size 6 femur cemented, size 6 cemented  tibial component, size 41 mm all poly patella and a 10 mm all poly ethylene  insert.   BRIEF HISTORY:  The patient is a 70 year old male with long history of  having significant end stage degenerative joint disease of the left knee.  He has failed all conservative care and because of continued complaints of  pain with left knee situation, the patient was taken to the operating room  for a left total knee replacement.   DESCRIPTION OF PROCEDURE:  The patient was taken to the operating room and  after adequate anesthesia was obtained with general anesthetic, the patient  was placed supine on the operating table.  The left leg was then prepped and  draped in the usual sterile fashion.  Following this a midline incision was  made in the subcutaneous tissues and extended down to the level of the  extensor mechanism which was clearly identified and divided in its medial  side.  A medial parapatellar arthrotomy was undertaken.  Medial and lateral  meniscectomy was undertaken as well as anterior and posterior cruciate  excision as well as resection of the posterior patellar fat pad.  At this  point, computerized navigation was begun.  The  guide pins were placed in the  distal medial femur.  The guide pins were placed in the distal tibia and the  tibial and the E-rays were obtained.  At that point, the leg was shown to be  in about 7 mm of varus alignment and with a 10 degree flexion contracture.  At this point, medial releases were undertaken and the distal femoral cut  was made. Tensioning devices were then used and the leg alignment had been  corrected to within 2 degrees of straight.  At that point the more  aggressive medial resection was undertaken and this brought the leg  alignment into neutral long axis.  Attention was turned to the flexion and  extension gaps and they balanced well and at that point it was felt that the  computer had given a plan. We ultimately felt that going to the size 6  component was going to be the most appropriate plan and this was undertaken.  At this point, the computer was again used for the alignment of the distal  femoral cut and then also for the rotation of the cuts of the femoral  component.  Once these were undertaken, the patient had the anterior  posterior cuts as well as the chamfer cuts and the box cut was then used.  The trial components were then put in place.  The tibia was sized to a 6 and  it was drilled and then [place].  At this point, the knee was copiously  irrigated and suctioned dry after the trials were put in place.  An  excellent range of motion was achieved. Attention was turned to the patella  which was resected to a level of 14 mm which cut 13 mm off the patella and  the 41 mm all poly patella was put in place.  At this time, the patellar  holes were drilled.  Excellent range of motion with perfect patella tracking  was achieved at this point.  At this point, the final components were  cemented in place after the knee was copiously irrigated and suctioned dry  with the pulsatile lavage irrigation.  Once the component was in place, the  cement was allowed to  harden.  [place] were placed again and the alignment  was noted to be perfect at this point in easy full extension and the varus  valgus was at 0 degrees and perfectly aligned.  The cement was allowed to  harden at this point. A medium Hemovac drain was placed.  The medial  parapatellar arthrotomy was closed with #1 Vicryl running suture, the skin  with 0 and 2-0 Vicryl and the skin with skin staples. A sterile compressive  dressing was applied and the patient was taken to the recovery room and  noted to be satisfactory condition.  Estimated blood loss for the procedure  was 100 mL.       JLG/MEDQ  D:  08/15/2004  T:  08/15/2004  Job:  [id number]

## 2011-03-08 NOTE — Op Note (Signed)
NAME:  [name], [name]               ACCOUNT NO.:  [id number]   MEDICAL RECORD NO.:  [id number]          PATIENT TYPE:  AMB   LOCATION:  DSC                          FACILITY:  [facility]   PHYSICIAN:  [name] [name], M.D.   DATE OF BIRTH:  09/09/1941   DATE OF PROCEDURE:  09/05/2006  DATE OF DISCHARGE:                               OPERATIVE REPORT   PREOPERATIVE DIAGNOSIS:  Carpal tunnel syndrome, left, with trigger  finger, left little finger.   POSTOPERATIVE DIAGNOSIS:  Carpal tunnel syndrome, left, with trigger  finger, left little finger.   PRINCIPAL PROCEDURE:  1. Left carpal tunnel release.  2. Left trigger finger release to the little finger through a separate      incision.   SURGEON:  [name] [name], M.D.   ASSISTANT:  [name] [name], P.A.   ANESTHESIA:  __________  intravenously and regional.   BRIEF HISTORY:  [name] [name] is a 70 year old male with a long history of having  significant carpal tunnel symptoms on the left side.  EMGs showed that  he had this.  He is also having locking and catching of his little  finger and triggering.  He had had injections which had not helped and  is being taken to the operating room for carpal tunnel release.  It is  felt that releasing the trigger finger was appropriate as well.  At this  point, he is taken to the operating room for these procedures.   PROCEDURE:  The patient was taken to the operating room and after  adequate anesthesia was obtained with general anesthetic with the  __________ , the patient was placed supine on the operating room table.  The left arm was prepped and draped in the usual sterile fashion.  Following this, a straight incision was made over the little finger.  The subcutaneous tissues were taken down to the level of the little  finger.  The little finger A1 pulley was identified and then divided.  Care was taken to put retractors in to protect the digital nerves.  Once  this was released, a curved  hemostat was used under the tendon.  The  tendon could be pulled completely under the wound with no tendency  towards locking or catching.  This wound was irrigated and suctioned  dry.  Attention was then turned to the wrist where a small, curved  incision was made just ulnar to the midline wrist crease.  The  subcutaneous tissue was dissected down.  The volar carpal ligament was  clearly identified and divided.  The curved elevator was used after a  small rent was made to protect the median nerve underneath and then the  curved scissors were used to release the volar carpal ligament.  The  median nerve was then identified and noted to have an hourglass  constriction.  The motor branch of the median nerve was identified and  noted to be freed.  At this point, the wound was copiously irrigated and  suctioned dry.  It was closed with a combination of interrupted and  running sutures.  A sterile  compressive  dressing was applied as well as a volar plaster.  The  patient was taken to recovery where he was noted to be in satisfactory  condition.   ESTIMATED BLOOD LOSS:  None.      [name] [name], M.D.  Electronically Signed     JLG/MEDQ  D:  09/05/2006  T:  09/06/2006  Job:  [id number]

## 2011-03-08 NOTE — Discharge Summary (Signed)
NAMEJOZEPH, PERSING NO.:  [id number]   MEDICAL RECORD NO.:  [id number]                   PATIENT TYPE:  INP   LOCATION:  5154                                 FACILITY:  [facility]   PHYSICIAN:  Sherin Quarry, MD                   DATE OF BIRTH:  [birth year]   DATE OF ADMISSION:  10/02/2003  DATE OF DISCHARGE:                                 DISCHARGE SUMMARY   HOSPITAL COURSE:  [name] [name] is a 70 year old man who is a resident of an  assisted living facility.  He has a history of hypothyroidism and  hypocalcemia.  For several days prior to his presentation on October 02, 2003 the patient was reported to have worsening confusion.  He had been  behaving in a strange fashion and was very agitated.  He did not seem to be  having any problems with fevers, chills, nausea, vomiting, abdominal pain,  chest pain, or shortness of breath.  On presentation to the emergency room  the patient was felt to be dehydrated and was therefore admitted for  evaluation of mental confusion and dehydration.  Physical exam at the time  of admission revealed a gentleman who was moderately confused.  Blood  pressure 105/67.  HEENT exam was within normal limits.  The chest was clear  to auscultation.  Cardiovascular exam revealed normal S1 and S2 without  rubs, murmurs, or gallops.  The abdomen was benign with normal bowel sounds  without masses, tenderness, or organomegaly.  Neurologic testing and  examination of extremities was normal.  Laboratory studies were remarkable  for a white count of 7300, hemoglobin was 11.7, sodium was 142, potassium  3.7, creatinine is 1.7, BUN was 28.  Liver profile was normal.  The  patient's thyroid studies revealed a TSH that was moderately suppressed at  0.140 suggesting that he might be getting a slightly excessive replacement  dose of thyroid medication.  During the course of the patient's  hospitalization he became more alert.  He ate a  normal diet.  Consultation  was obtained from Dr. Jeanie Sewer of the psychiatry service who indicated that  the patient most likely had schizoaffective disorder and described him as  being depressed in remission.  Dr. Providence Crosby recommendations were that the  patient should be taken off of all anticholinergic medications such as  Cogentin.  He also did not recommend starting a mood stabilizer.  He did not  recommend starting any antipsychotic therapy but recommended continuing  Paxil.  By December 16 as best I could determine the patient appeared to be  back at his usual mental status.  He was no longer agitated.  He was  cooperative.  He was eating meals.  He was able to answer simple questions.  Therefore, on December 16 the patient was discharged.   DISCHARGE DIAGNOSES:  1. Transient mental confusion of uncertain etiology.  2.  Dehydration.  3. Hypothyroidism.  4. Hypoparathyroidism.  5. Dyslipidemia.  6. Coronary artery disease.  7. Tardive dyskinesia.  8. Arthritis.  9. Chronic dyspepsia.  10.      Cowden's syndrome.   DISCHARGE MEDICATIONS:  1. The patient was advised to withhold Cogentin.  2. He was advised to decrease Levothroid to 200 mcg daily.  3. He was advised to continue:     a. Ergocalciferol 50,000 units daily.     b. Caltrate two tablets b.i.d.     c. Protonix 40 mg daily.     d. Aspirin 81 mg daily.     e. Paxil 40 mg daily.     f. Celebrex 200 mg daily.     g. Calcitriol 0.5 mcg daily.     h. Metoprolol 25 mg b.i.d.        a. Lopid 200 mg b.i.d.     i. Os-Cal three tablets b.i.d.     j. The eyedrops that he was taking previously.   CONDITION AT THE TIME OF DISCHARGE:  Fair.                                                Sherin Quarry, MD    SY/MEDQ  D:  10/06/2003  T:  10/06/2003  Job:  [id number]   cc:   Meredith Staggers, M.D.  [address]  [place]  [place] [address]  Fax: [phone]

## 2011-03-13 ENCOUNTER — Emergency Department (HOSPITAL_COMMUNITY)
Admission: EM | Admit: 2011-03-13 | Discharge: 2011-03-13 | Disposition: A | Discharge status: Home / Self Care | Payer: Medicare Other | Attending: Emergency Medicine | Admitting: Emergency Medicine

## 2011-03-13 ENCOUNTER — Emergency Department (HOSPITAL_COMMUNITY): Payer: Medicare Other

## 2011-03-13 DIAGNOSIS — Y9210 Unspecified residential institution as the place of occurrence of the external cause: Secondary | ICD-10-CM | POA: Insufficient documentation

## 2011-03-13 DIAGNOSIS — M545 Low back pain, unspecified: Secondary | ICD-10-CM | POA: Insufficient documentation

## 2011-03-13 DIAGNOSIS — M51379 Other intervertebral disc degeneration, lumbosacral region without mention of lumbar back pain or lower extremity pain: Secondary | ICD-10-CM | POA: Insufficient documentation

## 2011-03-13 DIAGNOSIS — G309 Alzheimer's disease, unspecified: Secondary | ICD-10-CM | POA: Insufficient documentation

## 2011-03-13 DIAGNOSIS — W010XXA Fall on same level from slipping, tripping and stumbling without subsequent striking against object, initial encounter: Secondary | ICD-10-CM | POA: Insufficient documentation

## 2011-03-13 DIAGNOSIS — E039 Hypothyroidism, unspecified: Secondary | ICD-10-CM | POA: Insufficient documentation

## 2011-03-13 DIAGNOSIS — Z7901 Long term (current) use of anticoagulants: Secondary | ICD-10-CM | POA: Insufficient documentation

## 2011-03-13 DIAGNOSIS — M5137 Other intervertebral disc degeneration, lumbosacral region: Secondary | ICD-10-CM | POA: Insufficient documentation

## 2011-03-13 DIAGNOSIS — T1490XA Injury, unspecified, initial encounter: Secondary | ICD-10-CM | POA: Insufficient documentation

## 2011-03-13 DIAGNOSIS — F0280 Dementia in other diseases classified elsewhere without behavioral disturbance: Secondary | ICD-10-CM | POA: Insufficient documentation

## 2011-03-15 ENCOUNTER — Emergency Department (HOSPITAL_COMMUNITY): Payer: Medicare Other

## 2011-03-15 ENCOUNTER — Emergency Department (HOSPITAL_COMMUNITY)
Admission: EM | Admit: 2011-03-15 | Discharge: 2011-03-16 | Disposition: A | Discharge status: Home / Self Care | Payer: Medicare Other | Attending: Emergency Medicine | Admitting: Emergency Medicine

## 2011-03-15 DIAGNOSIS — M1990 Unspecified osteoarthritis, unspecified site: Secondary | ICD-10-CM | POA: Insufficient documentation

## 2011-03-15 DIAGNOSIS — S1093XA Contusion of unspecified part of neck, initial encounter: Secondary | ICD-10-CM | POA: Insufficient documentation

## 2011-03-15 DIAGNOSIS — E039 Hypothyroidism, unspecified: Secondary | ICD-10-CM | POA: Insufficient documentation

## 2011-03-15 DIAGNOSIS — G309 Alzheimer's disease, unspecified: Secondary | ICD-10-CM | POA: Insufficient documentation

## 2011-03-15 DIAGNOSIS — F0280 Dementia in other diseases classified elsewhere without behavioral disturbance: Secondary | ICD-10-CM | POA: Insufficient documentation

## 2011-03-15 DIAGNOSIS — Z79899 Other long term (current) drug therapy: Secondary | ICD-10-CM | POA: Insufficient documentation

## 2011-03-15 DIAGNOSIS — Y9210 Unspecified residential institution as the place of occurrence of the external cause: Secondary | ICD-10-CM | POA: Insufficient documentation

## 2011-03-15 DIAGNOSIS — W07XXXA Fall from chair, initial encounter: Secondary | ICD-10-CM | POA: Insufficient documentation

## 2011-03-15 DIAGNOSIS — Z7982 Long term (current) use of aspirin: Secondary | ICD-10-CM | POA: Insufficient documentation

## 2011-03-15 DIAGNOSIS — G8929 Other chronic pain: Secondary | ICD-10-CM | POA: Insufficient documentation

## 2011-03-15 DIAGNOSIS — S0003XA Contusion of scalp, initial encounter: Secondary | ICD-10-CM | POA: Insufficient documentation

## 2011-03-19 ENCOUNTER — Encounter: Payer: Self-pay | Admitting: Endocrinology

## 2011-03-19 ENCOUNTER — Other Ambulatory Visit (INDEPENDENT_AMBULATORY_CARE_PROVIDER_SITE_OTHER): Payer: Medicare Other

## 2011-03-19 ENCOUNTER — Ambulatory Visit (INDEPENDENT_AMBULATORY_CARE_PROVIDER_SITE_OTHER): Payer: Medicare Other | Admitting: Endocrinology

## 2011-03-19 DIAGNOSIS — E209 Hypoparathyroidism, unspecified: Secondary | ICD-10-CM

## 2011-03-19 DIAGNOSIS — E890 Postprocedural hypothyroidism: Secondary | ICD-10-CM

## 2011-03-19 LAB — TSH: TSH: 0.46 u[IU]/mL (ref 0.35–5.50)

## 2011-03-19 NOTE — Progress Notes (Signed)
Subjective:    Patient ID: [name] [name], male    DOB: Feb 23, 1941, 70 y.o.   MRN: [id number]  HPI Pt was seen in er 3 weeks ago for a fall.  He says dr patel stopped several meds, but he still takes synthroid and rocaltrol.  He reports epigastric pain, lightheadedness, anxiety, tremor, and cold intolerance.   Past Medical History  Diagnosis Date  . Other congenital hamartoses, not elsewhere classified   . Family history of ischemic heart disease   . Family history of malignant neoplasm of gastrointestinal tract   . Allergic rhinitis, cause unspecified   . Other and unspecified hyperlipidemia   . Unspecified essential hypertension   . Obesity hypoventilation syndrome     failed cpap/bipap trial    No past surgical history on file.  History   Social History  . Marital Status: Widowed    Spouse Name: N/A    Number of Children: N/A  . Years of Education: N/A   Occupational History  . Not on file.   Social History Main Topics  . Smoking status: Never Smoker   . Smokeless tobacco: Not on file  . Alcohol Use:   . Drug Use:   . Sexually Active:    Other Topics Concern  . Not on file   Social History Narrative  . No narrative on file    Current Outpatient Prescriptions on File Prior to Visit  Medication Sig Dispense Refill  . acetaminophen (TYLENOL) 500 MG tablet Take 500 mg by mouth every 6 (six) hours as needed.        . ALPRAZolam (XANAX) 0.5 MG tablet Take 0.5 mg by mouth 2 (two) times daily as needed.        Marland Kitchen aspirin 325 MG tablet Take 325 mg by mouth daily.        . bisacodyl (DULCOLAX) 5 MG EC tablet Take 10 mg by mouth daily as needed.       . calcitRIOL (ROCALTROL) 0.5 MCG capsule Take 0.5 mcg by mouth 2 (two) times daily.        . Calcium Carbonate-Vitamin D (CALCIUM-VITAMIN D) 500-200 MG-UNIT per tablet Take 1 tablet by mouth 2 (two) times daily with a meal.        . carboxymethylcellulose (REFRESH PLUS) 0.5 % SOLN Apply 1 drop to eye 2 (two) times daily. 1  drop into both eyes twice daily      . clopidogrel (PLAVIX) 75 MG tablet Take 75 mg by mouth daily.        . fluticasone (FLONASE) 50 MCG/ACT nasal spray 2 sprays by Nasal route daily.        Marland Kitchen ipratropium (ATROVENT) 0.02 % nebulizer solution Take 500 mcg by nebulization every 6 (six) hours as needed.       Marland Kitchen levothyroxine (SYNTHROID, LEVOTHROID) 150 MCG tablet Take 150 mcg by mouth daily.        Marland Kitchen LORazepam (ATIVAN) 0.5 MG tablet Take 0.5 mg by mouth 3 (three) times daily as needed.       . Melatonin 3 MG TABS Take 1 tablet by mouth daily.        . metoprolol tartrate (LOPRESSOR) 25 MG tablet Take 12.5 mg by mouth 2 (two) times daily.        . polyethylene glycol (MIRALAX / GLYCOLAX) packet Take 17 g by mouth daily as needed.        . simethicone (GAS-X) 80 MG chewable tablet every 6 (six) hours as needed. 1  tablet in between meals and 1 at bedtime       . Tamsulosin HCl (FLOMAX) 0.4 MG CAPS Take 0.4 mg by mouth daily.        Marland Kitchen zolpidem (AMBIEN) 10 MG tablet Take 10 mg by mouth at bedtime as needed.        Marland Kitchen DISCONTD: hydrochlorothiazide (,MICROZIDE/HYDRODIURIL,) 12.5 MG capsule Take 12.5 mg by mouth daily.        Marland Kitchen DISCONTD: OxyCODONE HCl, Abuse Deter, 5 MG TABS Take 1 tablet by mouth every 6 (six) hours as needed.       Marland Kitchen DISCONTD: pantoprazole (PROTONIX) 40 MG tablet Take 40 mg by mouth daily.        Marland Kitchen DISCONTD: traMADol (ULTRAM) 50 MG tablet Take 50 mg by mouth every 4 (four) hours as needed.       Marland Kitchen DISCONTD: traZODone (DESYREL) 100 MG tablet Take 100 mg by mouth at bedtime as needed.         No Known Allergies  Family History  Problem Relation Age of Onset  . Cancer Other     colon  . Heart disease Other     CAD, MI and Heart Attack    BP 114/72  Pulse 76  Temp(Src) 98.1 F (36.7 C) (Oral)  Ht 6\' 3"  (1.905 m)  Wt 265 lb (120.203 kg)  BMI 33.12 kg/m2  SpO2 96%     Review of Systems He report a few leg cramps.    Objective:   Physical Exam frail, no distress Neck:  a healed scar is present.  i do not appreciate a nodule in the thyroid or elsewhere in the neck    Lab Results  Component Value Date   PTH <2.5* 03/19/2011   CALCIUM 9.5 03/19/2011   CAION 1.02* 02/25/2011   PHOS 5.2* 12/25/2009   Lab Results  Component Value Date   TSH 0.46 03/19/2011     Assessment & Plan:  Hypoparathyroidism., overreplaced

## 2011-03-19 NOTE — Patient Instructions (Addendum)
blood tests are being ordered for you again today.  We'll forward results to [facility]. Goal will be normal tsh, and ca++ between 8 and 9. Return here as needed. Pt is requesting to see dr patel sooner than the next scheduled (july) appt.  i am passing on this request. (update: i left message on phone-tree:  Reduce rocaltrol to 0.5 mcg po daily).

## 2011-03-20 LAB — PTH, INTACT AND CALCIUM
Calcium, Total (PTH): 9.5 mg/dL (ref 8.4–10.5)
PTH: <2.5 pg/mL — ABNORMAL LOW (ref 14.0–72.0)

## 2011-03-27 ENCOUNTER — Ambulatory Visit (INDEPENDENT_AMBULATORY_CARE_PROVIDER_SITE_OTHER)
Admission: RE | Admit: 2011-03-27 | Discharge: 2011-03-27 | Disposition: A | Discharge status: Home / Self Care | Payer: Medicare Other | Source: Ambulatory Visit | Attending: Pulmonary Disease | Admitting: Pulmonary Disease

## 2011-03-27 DIAGNOSIS — R918 Other nonspecific abnormal finding of lung field: Secondary | ICD-10-CM

## 2011-04-03 ENCOUNTER — Emergency Department (HOSPITAL_COMMUNITY)
Admission: EM | Admit: 2011-04-03 | Discharge: 2011-04-04 | Disposition: A | Discharge status: Home / Self Care | Payer: Medicare Other | Attending: Emergency Medicine | Admitting: Emergency Medicine

## 2011-04-03 ENCOUNTER — Emergency Department (HOSPITAL_COMMUNITY): Payer: Medicare Other

## 2011-04-03 DIAGNOSIS — K5900 Constipation, unspecified: Secondary | ICD-10-CM | POA: Insufficient documentation

## 2011-04-03 DIAGNOSIS — F0280 Dementia in other diseases classified elsewhere without behavioral disturbance: Secondary | ICD-10-CM | POA: Insufficient documentation

## 2011-04-03 DIAGNOSIS — G8929 Other chronic pain: Secondary | ICD-10-CM | POA: Insufficient documentation

## 2011-04-03 DIAGNOSIS — R1032 Left lower quadrant pain: Secondary | ICD-10-CM | POA: Insufficient documentation

## 2011-04-03 DIAGNOSIS — M1990 Unspecified osteoarthritis, unspecified site: Secondary | ICD-10-CM | POA: Insufficient documentation

## 2011-04-03 DIAGNOSIS — K429 Umbilical hernia without obstruction or gangrene: Secondary | ICD-10-CM | POA: Insufficient documentation

## 2011-04-03 DIAGNOSIS — Z79899 Other long term (current) drug therapy: Secondary | ICD-10-CM | POA: Insufficient documentation

## 2011-04-03 DIAGNOSIS — E039 Hypothyroidism, unspecified: Secondary | ICD-10-CM | POA: Insufficient documentation

## 2011-04-03 DIAGNOSIS — G309 Alzheimer's disease, unspecified: Secondary | ICD-10-CM | POA: Insufficient documentation

## 2011-04-03 DIAGNOSIS — R10819 Abdominal tenderness, unspecified site: Secondary | ICD-10-CM | POA: Insufficient documentation

## 2011-04-03 DIAGNOSIS — N2889 Other specified disorders of kidney and ureter: Secondary | ICD-10-CM | POA: Insufficient documentation

## 2011-04-03 DIAGNOSIS — Z7982 Long term (current) use of aspirin: Secondary | ICD-10-CM | POA: Insufficient documentation

## 2011-04-03 LAB — CBC
HCT: 37.9 % — ABNORMAL LOW (ref 39.0–52.0)
MCHC: 34.3 g/dL (ref 30.0–36.0)
Platelets: 175 10*3/uL (ref 150–400)
RDW: 12.6 % (ref 11.5–15.5)
WBC: 8.6 10*3/uL (ref 4.0–10.5)

## 2011-04-03 LAB — DIFFERENTIAL
Basophils Absolute: 0 10*3/uL (ref 0.0–0.1)
Basophils Relative: 0 % (ref 0–1)
Eosinophils Absolute: 0.2 10*3/uL (ref 0.0–0.7)
Eosinophils Relative: 2 % (ref 0–5)
Lymphocytes Relative: 26 % (ref 12–46)
Monocytes Absolute: 0.8 10*3/uL (ref 0.1–1.0)

## 2011-04-04 LAB — COMPREHENSIVE METABOLIC PANEL
ALT: 13 U/L (ref 0–53)
Albumin: 3.2 g/dL — ABNORMAL LOW (ref 3.5–5.2)
BUN: 15 mg/dL (ref 6–23)
Calcium: 9.5 mg/dL (ref 8.4–10.5)
GFR calc Af Amer: >60 mL/min (ref >60)
Glucose, Bld: 88 mg/dL (ref 70–99)
Sodium: 139 mEq/L (ref 135–145)
Total Protein: 6.4 g/dL (ref 6.0–8.3)

## 2011-04-04 LAB — URINALYSIS, ROUTINE W REFLEX MICROSCOPIC
Hgb urine dipstick: NEGATIVE
Nitrite: NEGATIVE
Protein, ur: NEGATIVE mg/dL
Specific Gravity, Urine: 1.012 (ref 1.005–1.030)
Urobilinogen, UA: 0.2 mg/dL (ref 0.0–1.0)

## 2011-04-04 LAB — LIPASE, BLOOD: Lipase: 32 U/L (ref 11–59)

## 2011-04-04 MED ORDER — IOHEXOL 300 MG/ML  SOLN
100.0000 mL | Freq: Once | INTRAMUSCULAR | Status: AC | PRN
  Administered 2011-04-04: 100 mL via INTRAVENOUS

## 2011-04-11 ENCOUNTER — Emergency Department (HOSPITAL_COMMUNITY)
Admission: EM | Admit: 2011-04-11 | Discharge: 2011-04-11 | Disposition: A | Discharge status: Home / Self Care | Payer: Medicare Other | Attending: Emergency Medicine | Admitting: Emergency Medicine

## 2011-04-11 ENCOUNTER — Encounter (HOSPITAL_COMMUNITY): Payer: Self-pay | Admitting: Radiology

## 2011-04-11 ENCOUNTER — Emergency Department (HOSPITAL_COMMUNITY): Payer: Medicare Other

## 2011-04-11 DIAGNOSIS — E039 Hypothyroidism, unspecified: Secondary | ICD-10-CM | POA: Insufficient documentation

## 2011-04-11 DIAGNOSIS — M129 Arthropathy, unspecified: Secondary | ICD-10-CM | POA: Insufficient documentation

## 2011-04-11 DIAGNOSIS — G309 Alzheimer's disease, unspecified: Secondary | ICD-10-CM | POA: Insufficient documentation

## 2011-04-11 DIAGNOSIS — N289 Disorder of kidney and ureter, unspecified: Secondary | ICD-10-CM | POA: Insufficient documentation

## 2011-04-11 DIAGNOSIS — G8929 Other chronic pain: Secondary | ICD-10-CM | POA: Insufficient documentation

## 2011-04-11 DIAGNOSIS — R079 Chest pain, unspecified: Secondary | ICD-10-CM | POA: Insufficient documentation

## 2011-04-11 DIAGNOSIS — R109 Unspecified abdominal pain: Secondary | ICD-10-CM | POA: Insufficient documentation

## 2011-04-11 DIAGNOSIS — E669 Obesity, unspecified: Secondary | ICD-10-CM | POA: Insufficient documentation

## 2011-04-11 DIAGNOSIS — Z79899 Other long term (current) drug therapy: Secondary | ICD-10-CM | POA: Insufficient documentation

## 2011-04-11 DIAGNOSIS — F0280 Dementia in other diseases classified elsewhere without behavioral disturbance: Secondary | ICD-10-CM | POA: Insufficient documentation

## 2011-04-11 LAB — BASIC METABOLIC PANEL
BUN: 22 mg/dL (ref 6–23)
CO2: 27 mEq/L (ref 19–32)
Calcium: 8.7 mg/dL (ref 8.4–10.5)
Chloride: 104 mEq/L (ref 96–112)
Creatinine, Ser: 1.01 mg/dL (ref 0.50–1.35)
Glucose, Bld: 91 mg/dL (ref 70–99)

## 2011-04-11 LAB — URINALYSIS, ROUTINE W REFLEX MICROSCOPIC
Glucose, UA: NEGATIVE mg/dL
Ketones, ur: 15 mg/dL — AB
Leukocytes, UA: NEGATIVE
Nitrite: NEGATIVE
Specific Gravity, Urine: 1.033 — ABNORMAL HIGH (ref 1.005–1.030)
pH: 6 (ref 5.0–8.0)

## 2011-04-11 LAB — DIFFERENTIAL
Basophils Absolute: 0 10*3/uL (ref 0.0–0.1)
Lymphocytes Relative: 26 % (ref 12–46)
Lymphs Abs: 1.6 10*3/uL (ref 0.7–4.0)
Neutro Abs: 3.9 10*3/uL (ref 1.7–7.7)

## 2011-04-11 LAB — CBC
HCT: 36.6 % — ABNORMAL LOW (ref 39.0–52.0)
Hemoglobin: 12.1 g/dL — ABNORMAL LOW (ref 13.0–17.0)
WBC: 6.1 10*3/uL (ref 4.0–10.5)

## 2011-04-11 LAB — CK TOTAL AND CKMB (NOT AT ARMC): Total CK: 23 U/L (ref 7–232)

## 2011-04-11 LAB — TROPONIN I: Troponin I: <0.3 ng/mL (ref <0.30)

## 2011-04-11 LAB — D-DIMER, QUANTITATIVE: D-Dimer, Quant: 1 ug/mL-FEU — ABNORMAL HIGH (ref 0.00–0.48)

## 2011-04-11 MED ORDER — IOHEXOL 350 MG/ML SOLN
100.0000 mL | Freq: Once | INTRAVENOUS | Status: AC | PRN
  Administered 2011-04-11: 100 mL via INTRAVENOUS

## 2011-05-11 ENCOUNTER — Inpatient Hospital Stay (HOSPITAL_COMMUNITY)
Admission: EM | Admit: 2011-05-11 | Discharge: 2011-05-13 | DRG: 313 | Disposition: A | Discharge status: Nursing Home (Includes ICF) | Payer: Medicare Other | Attending: Internal Medicine | Admitting: Internal Medicine

## 2011-05-11 DIAGNOSIS — R142 Eructation: Secondary | ICD-10-CM | POA: Diagnosis present

## 2011-05-11 DIAGNOSIS — R141 Gas pain: Secondary | ICD-10-CM | POA: Diagnosis present

## 2011-05-11 DIAGNOSIS — Z7982 Long term (current) use of aspirin: Secondary | ICD-10-CM

## 2011-05-11 DIAGNOSIS — E039 Hypothyroidism, unspecified: Secondary | ICD-10-CM | POA: Diagnosis present

## 2011-05-11 DIAGNOSIS — R0789 Other chest pain: Principal | ICD-10-CM | POA: Diagnosis present

## 2011-05-11 DIAGNOSIS — I509 Heart failure, unspecified: Secondary | ICD-10-CM | POA: Diagnosis present

## 2011-05-11 DIAGNOSIS — R143 Flatulence: Secondary | ICD-10-CM | POA: Diagnosis present

## 2011-05-11 DIAGNOSIS — K219 Gastro-esophageal reflux disease without esophagitis: Secondary | ICD-10-CM | POA: Diagnosis present

## 2011-05-11 DIAGNOSIS — F068 Other specified mental disorders due to known physiological condition: Secondary | ICD-10-CM | POA: Diagnosis present

## 2011-05-11 DIAGNOSIS — M940 Chondrocostal junction syndrome [Tietze]: Secondary | ICD-10-CM | POA: Diagnosis present

## 2011-05-11 DIAGNOSIS — F411 Generalized anxiety disorder: Secondary | ICD-10-CM | POA: Diagnosis present

## 2011-05-11 DIAGNOSIS — N289 Disorder of kidney and ureter, unspecified: Secondary | ICD-10-CM | POA: Diagnosis present

## 2011-05-11 DIAGNOSIS — M1990 Unspecified osteoarthritis, unspecified site: Secondary | ICD-10-CM | POA: Diagnosis present

## 2011-05-11 DIAGNOSIS — Z7902 Long term (current) use of antithrombotics/antiplatelets: Secondary | ICD-10-CM

## 2011-05-11 DIAGNOSIS — I10 Essential (primary) hypertension: Secondary | ICD-10-CM | POA: Diagnosis present

## 2011-05-11 DIAGNOSIS — I5032 Chronic diastolic (congestive) heart failure: Secondary | ICD-10-CM | POA: Diagnosis present

## 2011-05-11 DIAGNOSIS — E785 Hyperlipidemia, unspecified: Secondary | ICD-10-CM | POA: Diagnosis present

## 2011-05-11 LAB — MRSA PCR SCREENING: MRSA by PCR: NEGATIVE

## 2011-05-12 LAB — CK TOTAL AND CKMB (NOT AT ARMC)
CK, MB: 1.3 ng/mL (ref 0.3–4.0)
Total CK: 26 U/L (ref 7–232)

## 2011-05-13 LAB — COMPREHENSIVE METABOLIC PANEL
Albumin: 3.1 g/dL — ABNORMAL LOW (ref 3.5–5.2)
Alkaline Phosphatase: 70 U/L (ref 39–117)
BUN: 16 mg/dL (ref 6–23)
Creatinine, Ser: 0.98 mg/dL (ref 0.50–1.35)
Potassium: 3.8 mEq/L (ref 3.5–5.1)
Total Protein: 6.1 g/dL (ref 6.0–8.3)

## 2011-05-13 LAB — TROPONIN I: Troponin I: <0.3 ng/mL (ref <0.30)

## 2011-05-13 LAB — URINALYSIS, ROUTINE W REFLEX MICROSCOPIC
Leukocytes, UA: NEGATIVE
Nitrite: NEGATIVE
Specific Gravity, Urine: 1.017 (ref 1.005–1.030)
pH: 8 (ref 5.0–8.0)

## 2011-05-13 LAB — CBC
HCT: 36.9 % — ABNORMAL LOW (ref 39.0–52.0)
RBC: 3.92 MIL/uL — ABNORMAL LOW (ref 4.22–5.81)
RDW: 13 % (ref 11.5–15.5)
WBC: 6.9 10*3/uL (ref 4.0–10.5)

## 2011-05-13 LAB — CK TOTAL AND CKMB (NOT AT ARMC)
CK, MB: 1.1 ng/mL (ref 0.3–4.0)
Relative Index: INVALID (ref 0.0–2.5)

## 2011-05-13 LAB — DIFFERENTIAL
Basophils Absolute: 0 10*3/uL (ref 0.0–0.1)
Eosinophils Relative: 2 % (ref 0–5)
Lymphocytes Relative: 24 % (ref 12–46)
Neutro Abs: 4.5 10*3/uL (ref 1.7–7.7)
Neutrophils Relative %: 66 % (ref 43–77)

## 2011-05-13 LAB — LIPASE, BLOOD: Lipase: 31 U/L (ref 11–59)

## 2011-05-16 ENCOUNTER — Other Ambulatory Visit: Payer: Self-pay | Admitting: Urology

## 2011-05-16 DIAGNOSIS — N2889 Other specified disorders of kidney and ureter: Secondary | ICD-10-CM

## 2011-05-20 NOTE — Discharge Summary (Signed)
NAMEELDREDGE, VELDHUIZEN               ACCOUNT NO.:  [id number]  MEDICAL RECORD NO.:  [id number]  LOCATION:  2010                         FACILITY:  [facility]  PHYSICIAN:  Marieelena Bartko I Goldman Birchall, MD      DATE OF BIRTH:  02-01-1941  DATE OF ADMISSION:  05/11/2011 DATE OF DISCHARGE:  05/13/2011                              DISCHARGE SUMMARY   DISCHARGE DIAGNOSES: 1. Atypical chest pain, felt to be possibly secondary to     costochondritis versus gas distention of the abdomen, now     completely resolved. 2. History of left renal lesion and the workup by Dr. Mena Goes at     [facility].  The patient will see Dr. Mena Goes on Thursday,     May 16, 2011 for further evaluation. 3. History of diastolic heart failure. 4. Hypertension. 5. Gastroesophageal reflux disease. 6. History of anxiety disorder. 7. History of mild dementia. 8. History of hypothyroidism. 9. History of dyslipidemia.  PROCEDURES: 1. Chest x-ray:  Mild chronic bronchitic-type change, but no acute     pulmonary findings. 2. CT angio.  Negative for pulmonary embolism, cardiomegaly, and     nodular opacity of right hemidiaphragm consistent with benign     process. 3. CT abdomen and pelvis, exfoliate lesion of the lower pole of the     left kidney, worrisome for renal cell cancer since study 1 week     ago.  Punctate nonobstructive stone lesion on the left kidney.     Status post left cholecystectomy.  HISTORY OF PRESENT ILLNESS:  This is a 70 year old gentleman, who presented to the ED with chief complaint of chest pain.  He was hospitalized in the hospital in September last year and also in February this year because of atypical chest pain.  He had a negative stress test.  He was stable until the day of admission, when he developed chest pain associated with right lower rib pain.  He described it as fullness mainly with pressing, aggravated by deep inspiration and movement.  He described fullness all over.  He denies any  radiation of his pain.  He denies any sweating.  He denies any shortness of breath.  The pain is worrisome with palpation of the right lateral chest wall and mid chest area.  On admission to the hospital, the patient had and EKG, which did show sinus bradycardia with first-degree AV block.  No evidence of ST changes.  Cardiac enzymes were cycled, which were negative.  On taking the patient's history, it looked like atypical chest pain.  D-dimer was high, accordingly CT angiogram was done, which was negative.  We felt the patient's pain is atypical for coronary artery disease or for ACS. Pain is mainly reproducible by palpation.  The patient could be secondary to costochondritis or due to gas distention of his colon.  The patient given simethicone and Maalox, which helped his pain.  Currently, the patient is pain free.  CT scan suggested renal mass and unclosed history from the patient.  The patient already know about the above diagnosis and he has an appointment to see Dr. Mena Goes on Thursday, May 16, 2011 for further evaluation of the above renal mass.  Other medical issues remained stable during the patient's hospital stay.  DISCHARGE MEDICATIONS: 1. Tylenol Extra Strength 500 mg p.o. q.6 hours p.r.n. 2. Restasis 0.005 ophthalmic, both eyes 1 drop twice daily. 3. Simethicone 80 mg p.o. 1 tablet t.i.d., can go up to q.i.d. 4. Maalox q.6 hours p.r.n. 5. Atrovent inhaler q.6 hours p.r.n. 6. Ambien 10 mg p.o. at bedtime. 7. Clonazepam 0.5 mg 1 tablet t.i.d. p.r.n. 8. Xanax 0.5 mg daily b.i.d. 9. Metoprolol 12.5 mg p.o. b.i.d. 10.Bisacodyl 5 mg 2 tablets daily p.r.n. 11.MiraLax 17 g p.o. daily. 12.Artificial tears both eyes 1 drop b.i.d. 13.Flonase nasal p.r.n. 14.Aspirin 325 mg daily. 15.Plavix 75 mg p.o. daily. 16.Protonix 40 mg daily. 17.Calcium/vitamin D 500/200 mg p.o. daily b.i.d. 18.Flomax 0.4 mg daily. 19.Levothyroxine 115 mcg p.o. daily. 20.Calcitrol 0.5 mg  b.i.d. 21.Vitamin D 50,000 units p.o. q.7 days.  CONSULTATIONS:  None.  PHYSICAL EXAMINATION:  VITAL SIGNS:  Temperature 98.1, blood pressure 119/74, pulse rate 54, respiratory rate 18, saturating 95% on room air. GENERAL:  The patient is lying comfortably in bed, not in respiratory distress or shortness of breath. NECK:  Supple, no lymphadenopathy. HEART:  S1 and S2 with no other sounds. LUNGS:  Normal physical breathing with equal air entry.  Mild tenderness at the mid chest. ABDOMEN:  Soft, nontender.  Bowel sounds positive.  No organomegaly. There is no right chest wall pain. EXTREMITIES:  Without edema and peripheral pulses intact. CNS:  The patient is awake, alert, and oriented x3 with no focal neurological deficits.  Currently, the patient is stable to be discharged home.  FOLLOWUP:  The patient will need to follow up with Dr. Mena Goes on Thursday at 9 in the morning for further assessment of his renal mass.     Master Touchet Bosie Helper, MD     HIE/MEDQ  D:  05/13/2011  T:  05/13/2011  Job:  [id number]  cc:   Jerilee Field, MD  Electronically Signed by Ebony Cargo MD on 05/20/2011 02:50:17 PM

## 2011-05-23 NOTE — H&P (Signed)
[name] [name], TIFFANY NO.:  [id number]  MEDICAL RECORD NO.:  [id number]  LOCATION:  2010                         FACILITY:  [facility]  PHYSICIAN:  Gordy Savers, MDDATE OF BIRTH:  08-14-1941  DATE OF ADMISSION:  05/11/2011 DATE OF DISCHARGE:                             HISTORY & PHYSICAL   CHIEF COMPLAINT:  Chest pain.  HISTORY OF PRESENT ILLNESS:  The patient is a 70 year old gentleman who presented to the ED with a chief complaint of chest pain.  He was hospitalized here in September of last year and also in February of this year for atypical chest pain.  Following his last admission, he states he did have a negative stress test, but details are unclear.  He was stable until yesterday afternoon when he developed chest pain.  He stated that throughout the afternoon from approximately noon until 6 p.m. he experienced right-sided and mid chest pain.  He described this as a soreness.  At times, pain seemed to be aggravated by deep inspiration and movement.  He states that today he awoke with pain and described "soreness all over."  He also describes some dizziness and heaviness in the mid chest area.  He states the pain was paroxysmal and tended to come and go.  He denies any associated diaphoresis or shortness of breath.  He does state that the pain is worse with palpation over the right lateral chest wall and mid chest area.  He lives at [facility] and was given antacids without improvement. He denies any associated nausea or vomiting.  The patient has been admitted on 2 prior occasions for atypical chest pain.  He also has a history of diastolic heart failure and also there is some concern about possible restrictive lung disease.  At times, he has been treated with nocturnal oxygen.  Following his last hospitalization in February of this year, a cardiac catheterization was offered, but declined.  On April 11, 2011, the patient had a CT  angio performed that revealed cardiomegaly, but was negative for acute pulmonary embolism.  An abdominal and pelvic CT scan was also performed that revealed a left renal lesion worrisome for renal carcinoma.  The patient is now admitted for further evaluation and treatment of his chest pain syndrome.  PAST MEDICAL HISTORY:  The patient has a history of recurrent chest pain.  Additionally, he has diastolic heart failure, history of hypertension, and a history of mild dementia and anxiety disorder. Additionally, diagnoses include gastroesophageal flux disease, hypothyroidism, history of poorly defined coronary artery disease as well as dyslipidemia.  He has osteoarthritis.  There is some concern about restrictive lung disease.  SURGICAL PROCEDURES:  Knee surgery, bilateral cataract surgery, surgery for rotator cuff repair as well as a carpal tunnel release.  PRESENT MEDICAL REGIMEN: 1. Atrovent. 2. Ambien 10 mg. 3. Aspirin 325. 4. Flomax 0.4. 5. Flonase nasal spray. 6. Hydrochlorothiazide 12.5 mg daily. 7. Lorazepam 0.5 mg t.i.d. p.r.n. 8. Synthroid 0.15 mg daily. 9. Metoprolol 12.5 mg b.i.d. 10.MiraLax p.r.n. 11.Oxycodone p.r.n. 12.Protonix 40 mg daily. 13.Plavix 75 mg daily. 14.Calcitriol 0.5 mg b.i.d. 15.Tramadol 50 mg every 6 hours p.r.n. 16.Trazodone 100 mg at bedtime. 17.Xanax 0.5 mg  b.i.d.  SOCIAL HISTORY:  He resides in an assisted living facility, [facility].  FAMILY HISTORY:  Noncontributory.  Father died of an MI in his 49s, however.  REVIEW OF SYSTEMS:  Exam is otherwise noncontributory except as mentioned in the history of present illness.  PHYSICAL EXAMINATION:  VITAL SIGNS:  Temperature 98.6, blood pressure 100/70, heart rate 64, respiratory rate 18, and O2 saturation 99%. GENERAL:  This is a well-developed, elderly male who is comfortable at rest. SKIN:  Warm and dry without rash. HEENT:  Slight disconjugate gaze with the left eye externally  rotated. Pupil responses are normal.  Sclerae are clear.  Oropharynx revealed poor dental hygiene. NECK:  No bruits or neck vein distention.  He did have a thyroidectomy scar in the anterior lower neck region.  No neck vein distention. CHEST:  Slightly diminished breath sounds on the left, few scattered rhonchi noted.  No respiratory distress. CARDIOVASCULAR:  Normal S1 and S2.  No murmurs or gallops.  There was some mild anterior chest wall tenderness to gentle palpation. ABDOMEN:  Obese, soft, and nontender.  No organomegaly.  Bowel sounds were active. EXTERNAL GENITALIA:  Normal. EXTREMITIES:  No edema.  Dorsalis pedis pulses were full.  Posterior tibial pulses were not easily palpable. SKIN:  No rash or lesions.  LABORATORY STUDIES:  An EKG that revealed a normal sinus rhythm, low voltage but no acute changes.  Initial cardiac enzymes were normal.  BUN and creatinine 16 and 0.98 respectively.  White count 6.9 and hemoglobin 12.7.  IMPRESSION:  Atypical chest pain, history of negative stress test in February of this year.  ADDITIONAL DIAGNOSES: 1. Left renal lesion, rule out carcinoma. 2. History of diastolic heart failure. 3. Hypertension. 4. Gastroesophageal reflux disease. 5. Mild dementia. 6. Anxiety disorder.  DISPOSITION:  The patient will be admitted to a telemetry unit, cardiac enzymes will be cycled, and EKG will be reviewed in the morning.  The patient will be considered for a cardiology consultation depending on his clinical response.  Chest pain is quite atypical for coronary artery disease.  We will continue treating with PPI therapy and antacids.     Gordy Savers, MD     PFK/MEDQ  D:  05/11/2011  T:  05/12/2011  Job:  [id number]  Electronically Signed by Eleonore Chiquito MD on 05/23/2011 12:59:06 PM

## 2011-06-12 ENCOUNTER — Ambulatory Visit
Admission: RE | Admit: 2011-06-12 | Discharge: 2011-06-12 | Disposition: A | Discharge status: Home / Self Care | Payer: Medicare Other | Source: Ambulatory Visit | Attending: Urology | Admitting: Urology

## 2011-06-12 DIAGNOSIS — N2889 Other specified disorders of kidney and ureter: Secondary | ICD-10-CM

## 2011-06-12 HISTORY — DX: Hypothyroidism, unspecified: E03.9

## 2011-06-12 HISTORY — DX: Gastro-esophageal reflux disease without esophagitis: K21.9

## 2011-06-12 HISTORY — DX: Unspecified osteoarthritis, unspecified site: M19.90

## 2011-06-12 HISTORY — DX: Atherosclerotic heart disease of native coronary artery without angina pectoris: I25.10

## 2011-06-12 HISTORY — DX: Shortness of breath: R06.02

## 2011-06-12 HISTORY — DX: Malignant (primary) neoplasm, unspecified: C80.1

## 2011-06-13 ENCOUNTER — Telehealth: Payer: Self-pay | Admitting: Pulmonary Disease

## 2011-06-13 DIAGNOSIS — R911 Solitary pulmonary nodule: Secondary | ICD-10-CM

## 2011-06-13 NOTE — Telephone Encounter (Signed)
[facility], you saw pt last in March 2012 and planned to just do f/u CT Chest in 4 months. Looks like CT not even done yet. Now they are needing pulm clearance for renal surgery. Do you need him to come in for ov for this? Please advise, thanks!

## 2011-06-14 NOTE — Telephone Encounter (Signed)
Duplicate message. 

## 2011-06-14 NOTE — Telephone Encounter (Signed)
He needs to be scheduled for a noncontrast ct chest, then ov with me after that apptm.  We can do surgical clearance at that f/u visit.

## 2011-06-14 NOTE — Telephone Encounter (Signed)
I ATC the pt to advise of need or CT order, home # unable to leave a message, and mobile number disconnected.  Order placed for CT. Pt has an appt on 07-02-11 so CT needs to be done before then . Carron Curie, CMA

## 2011-06-17 NOTE — Telephone Encounter (Signed)
Pt aware of both appt dates and times the ct is scheduled for 06-28-11 at 11am--[facility] and the appt for dr clance is 07-02-11 at 10am--discussed in detail with pt and he read this info back to me and verbalized understanding. i told pt to call for further questions and gave [phone]

## 2011-06-18 ENCOUNTER — Emergency Department (HOSPITAL_COMMUNITY): Payer: Medicare Other

## 2011-06-18 ENCOUNTER — Inpatient Hospital Stay (HOSPITAL_COMMUNITY)
Admission: EM | Admit: 2011-06-18 | Discharge: 2011-06-24 | DRG: 194 | Disposition: A | Discharge status: Nursing Home (Includes ICF) | Payer: Medicare Other | Attending: Internal Medicine | Admitting: Internal Medicine

## 2011-06-18 DIAGNOSIS — K2970 Gastritis, unspecified, without bleeding: Secondary | ICD-10-CM | POA: Diagnosis present

## 2011-06-18 DIAGNOSIS — I10 Essential (primary) hypertension: Secondary | ICD-10-CM | POA: Diagnosis present

## 2011-06-18 DIAGNOSIS — J189 Pneumonia, unspecified organism: Principal | ICD-10-CM | POA: Diagnosis present

## 2011-06-18 DIAGNOSIS — E039 Hypothyroidism, unspecified: Secondary | ICD-10-CM | POA: Diagnosis present

## 2011-06-18 DIAGNOSIS — R911 Solitary pulmonary nodule: Secondary | ICD-10-CM | POA: Diagnosis present

## 2011-06-18 DIAGNOSIS — F0390 Unspecified dementia without behavioral disturbance: Secondary | ICD-10-CM | POA: Diagnosis present

## 2011-06-18 DIAGNOSIS — J9610 Chronic respiratory failure, unspecified whether with hypoxia or hypercapnia: Secondary | ICD-10-CM | POA: Diagnosis present

## 2011-06-18 DIAGNOSIS — J4489 Other specified chronic obstructive pulmonary disease: Secondary | ICD-10-CM | POA: Diagnosis present

## 2011-06-18 DIAGNOSIS — K2990 Gastroduodenitis, unspecified, without bleeding: Secondary | ICD-10-CM | POA: Diagnosis present

## 2011-06-18 DIAGNOSIS — J449 Chronic obstructive pulmonary disease, unspecified: Secondary | ICD-10-CM | POA: Diagnosis present

## 2011-06-18 DIAGNOSIS — N289 Disorder of kidney and ureter, unspecified: Secondary | ICD-10-CM | POA: Diagnosis present

## 2011-06-18 LAB — COMPREHENSIVE METABOLIC PANEL
AST: 23 U/L (ref 0–37)
Alkaline Phosphatase: 81 U/L (ref 39–117)
BUN: 17 mg/dL (ref 6–23)
CO2: 33 mEq/L — ABNORMAL HIGH (ref 19–32)
Chloride: 98 mEq/L (ref 96–112)
Creatinine, Ser: 1.01 mg/dL (ref 0.50–1.35)
GFR calc non Af Amer: >60 mL/min (ref >60)
Potassium: 3.8 mEq/L (ref 3.5–5.1)
Total Bilirubin: 0.2 mg/dL — ABNORMAL LOW (ref 0.3–1.2)

## 2011-06-18 LAB — POCT I-STAT TROPONIN I

## 2011-06-18 LAB — LIPASE, BLOOD: Lipase: 31 U/L (ref 11–59)

## 2011-06-18 LAB — DIFFERENTIAL
Lymphocytes Relative: 25 % (ref 12–46)
Lymphs Abs: 1.7 10*3/uL (ref 0.7–4.0)
Neutro Abs: 4.4 10*3/uL (ref 1.7–7.7)
Neutrophils Relative %: 64 % (ref 43–77)

## 2011-06-18 LAB — CBC
HCT: 37.8 % — ABNORMAL LOW (ref 39.0–52.0)
Hemoglobin: 12.8 g/dL — ABNORMAL LOW (ref 13.0–17.0)
MCV: 95.7 fL (ref 78.0–100.0)
RBC: 3.95 MIL/uL — ABNORMAL LOW (ref 4.22–5.81)
WBC: 6.8 10*3/uL (ref 4.0–10.5)

## 2011-06-18 LAB — CK TOTAL AND CKMB (NOT AT ARMC): Total CK: 23 U/L (ref 7–232)

## 2011-06-19 ENCOUNTER — Inpatient Hospital Stay (HOSPITAL_COMMUNITY): Payer: Medicare Other

## 2011-06-19 LAB — HEPATIC FUNCTION PANEL
ALT: 17 U/L (ref 0–53)
AST: 26 U/L (ref 0–37)
Alkaline Phosphatase: 72 U/L (ref 39–117)
Bilirubin, Direct: <0.1 mg/dL (ref 0.0–0.3)
Total Bilirubin: 0.2 mg/dL — ABNORMAL LOW (ref 0.3–1.2)

## 2011-06-19 LAB — BASIC METABOLIC PANEL
BUN: 15 mg/dL (ref 6–23)
Chloride: 103 mEq/L (ref 96–112)
Creatinine, Ser: 1.14 mg/dL (ref 0.50–1.35)
GFR calc Af Amer: >60 mL/min (ref >60)
Glucose, Bld: 98 mg/dL (ref 70–99)

## 2011-06-19 LAB — CBC
HCT: 36.4 % — ABNORMAL LOW (ref 39.0–52.0)
Hemoglobin: 12 g/dL — ABNORMAL LOW (ref 13.0–17.0)
MCV: 96.8 fL (ref 78.0–100.0)
RDW: 13.1 % (ref 11.5–15.5)
WBC: 8.3 10*3/uL (ref 4.0–10.5)

## 2011-06-19 MED ORDER — IOHEXOL 300 MG/ML  SOLN
100.0000 mL | Freq: Once | INTRAMUSCULAR | Status: AC | PRN
  Administered 2011-06-19: 100 mL via INTRAVENOUS

## 2011-06-19 NOTE — H&P (Signed)
[name] [name], [name] [name]               ACCOUNT NO.:  [id number]  MEDICAL RECORD NO.:  [id number]  LOCATION:  [facility]                         FACILITY:  [facility]  PHYSICIAN:  Gery Pray, MD      DATE OF BIRTH:  1941/06/16  DATE OF ADMISSION:  06/18/2011 DATE OF DISCHARGE:                             HISTORY & PHYSICAL   PRIMARY CARE PHYSICIAN:  Dr. Allena Katz  CODE STATUS:  Full code.    Team 4.  CHIEF COMPLAINT:  Epigastric pain.  HISTORY OF PRESENT ILLNESS:  This 70 year old male who lives at [facility].  He states that 1-2 weeks ago, he had chest pains.  He was brought in.  He was evaluated and rule out from MI, discharged home.  Today, he developed pain.  This time, it was epigastric in location.  He reports he had great discomfort.  He spent Saturday in bed and felt better Sunday; however, returned Sunday evening.  He states he has been having fatigue.  He has been having shoulder, knee, chest, and rib pains.  He states the pain is worse when he moves, but if he stays still, the pain goes away.  He reports no fevers.  He does report some chills.  No shortness of breath.  Some nausea today.  Today in the ER, he had some diarrhea.  He reports no wheezing.  He does have shortness of breath, but this is chronic.  He is on home oxygen.  Because of the severity of the epigastric pain, he was brought to the ER.  History obtained from the patient who is alert and oriented.  REVIEW OF SYSTEMS:  All 10-point systems reviewed are negative except as noted in the HPI.  PAST MEDICAL HISTORY: 1. Coronary artery disease with some history of diastolic heart     failure. 2. Hypertension. 3. Mild dementia. 4. Anxiety disorder. 5. Hypothyroidism. 6. Dyslipidemia. 7. Osteoarthritis. 8. Some restrictive lung disease.  PAST SURGICAL HISTORY:  Includes appendectomy, cataract surgery, and rotator cuff surgery.  MEDICATIONS: 1. Synthroid 150 mcg daily. 2. Aspirin 81  mg daily. 3. Plavix 75 mg daily. 4. Flomax. 5. Hydrochlorothiazide 12.5 mg daily. 6. Alprazolam 0.5 mg twice daily. 7. Metoprolol 25 mg half tablet daily. 8. Restasis eyedrops. 9. Melatonin q.hours sleep.  ALLERGIES:  No known drug allergies.  SOCIAL HISTORY:  Negative for tobacco, alcohol, or illicit drugs.  The patient has home oxygen.  He walks with a walker.  He states he resides at [facility] in an assisted-living center.  FAMILY HISTORY:  Noncontributory.  Dad had an MI.  PHYSICAL EXAMINATION:  VITAL SIGNS:  Blood pressure 94/61, pulse 62, respirations 18, and temperature 97.8. GENERAL:  Alert and oriented male, currently in no acute distress. EYES:  Pink conjunctivae.  PERRLA. ENT:  Moist oral mucosa.  Trachea midline. NECK:  Supple.  Positive dental caries. LUNGS:  Poor air exchange and no wheezing appreciated.  No use of accessory muscles. ABDOMEN:  Soft, positive bowel sounds, nontender, and nondistended.  No reproducible pain.  Unable to assess organomegaly due to the patient's body habitus. NEUROLOGIC:  Cranial nerves II through XII grossly intact.  Sensation intact. MUSCULOSKELETAL:  Strength 5/5 in all extremities.  No clubbing.  The patient may have trace edema. SKIN:  No rashes or subcutaneous crepitations. PSYCHIATRIC:  The patient has some repetitive actions such as lip smacking; however, he is alert and oriented.  LABS:  CT chest.  Negative for PE.  New left lung opacity atelectasis versus pneumonia, several nodules, small 7 mm undetermined etiology. Recommend followup.  Lactic acid 1.7, sodium 138, potassium 3.8, chloride 98, CO2 of 33, glucose 128, BUN 17, and creatinine 1.07.  LFTs normal.  Lipase 31.  White blood count 6.8, hemoglobin 12.8, and platelets 202.  Troponin is 0.0.  Chest x-ray, cardiomegaly with some central vascular congestion.  No overt edema.  ASSESSMENT AND PLAN: 1. Likely pneumonia.  At initial admission, the patient did have  some     slight hypotension.  Therefore, he did receive a bolus of fluids.     His blood pressure is now improved in the low 100s.  I will go     ahead and be sure that we have blood cultures of the patient and     urine cultures, and admit the patient.  We will do antibiotics,     nebulizers, and oxygen. 2. Chronic respiratory failure likely due to chronic obstructive     pulmonary disease.  Continue home oxygen. 3. Epigastric pain.  The patient's pain seems to be mostly located in     the epigastric area.  Therefore, we will have to place the patient     on Protonix and some Carafate.  The patient could have an ulcer as     he is on aspirin.  The patient was recently ruled out for any     cardiac issues. 4. Mild dementia. 5. Anxiety disorder. 6. Hypothyroidism. 7. Dyslipidemia.  Continue home medications.          ______________________________ Gery Pray, MD     DC/MEDQ  D:  06/19/2011  T:  06/19/2011  Job:  [id number]  Electronically Signed by Gery Pray MD on 06/19/2011 03:26:08 AM

## 2011-06-20 LAB — URINALYSIS, MICROSCOPIC ONLY
Bilirubin Urine: NEGATIVE
Hgb urine dipstick: NEGATIVE
Ketones, ur: NEGATIVE mg/dL
Nitrite: NEGATIVE
Protein, ur: NEGATIVE mg/dL
Specific Gravity, Urine: 1.013 (ref 1.005–1.030)
Urobilinogen, UA: 0.2 mg/dL (ref 0.0–1.0)

## 2011-06-20 LAB — BASIC METABOLIC PANEL
GFR calc Af Amer: >60 mL/min (ref >60)
GFR calc non Af Amer: >60 mL/min (ref >60)
Glucose, Bld: 106 mg/dL — ABNORMAL HIGH (ref 70–99)
Potassium: 3.8 mEq/L (ref 3.5–5.1)
Sodium: 139 mEq/L (ref 135–145)

## 2011-06-20 LAB — OCCULT BLOOD X 1 CARD TO LAB, STOOL: Fecal Occult Bld: NEGATIVE

## 2011-06-20 LAB — LEGIONELLA ANTIGEN, URINE

## 2011-06-21 LAB — BASIC METABOLIC PANEL
CO2: 34 mEq/L — ABNORMAL HIGH (ref 19–32)
Glucose, Bld: 111 mg/dL — ABNORMAL HIGH (ref 70–99)
Potassium: 4.2 mEq/L (ref 3.5–5.1)
Sodium: 139 mEq/L (ref 135–145)

## 2011-06-21 LAB — URINE CULTURE: Special Requests: NEGATIVE

## 2011-06-21 LAB — CBC
Hemoglobin: 12.7 g/dL — ABNORMAL LOW (ref 13.0–17.0)
RBC: 4 MIL/uL — ABNORMAL LOW (ref 4.22–5.81)

## 2011-06-22 LAB — CBC
HCT: 37.1 % — ABNORMAL LOW (ref 39.0–52.0)
Hemoglobin: 12.2 g/dL — ABNORMAL LOW (ref 13.0–17.0)
MCH: 31.8 pg (ref 26.0–34.0)
MCHC: 32.9 g/dL (ref 30.0–36.0)

## 2011-06-23 LAB — BASIC METABOLIC PANEL
CO2: 33 mEq/L — ABNORMAL HIGH (ref 19–32)
Calcium: 9.3 mg/dL (ref 8.4–10.5)
Creatinine, Ser: 1.09 mg/dL (ref 0.50–1.35)

## 2011-06-23 LAB — MRSA CULTURE

## 2011-06-23 LAB — CBC
MCH: 32.1 pg (ref 26.0–34.0)
MCV: 96.3 fL (ref 78.0–100.0)
Platelets: 182 10*3/uL (ref 150–400)
RBC: 3.8 MIL/uL — ABNORMAL LOW (ref 4.22–5.81)
RDW: 13 % (ref 11.5–15.5)

## 2011-06-25 LAB — CULTURE, BLOOD (ROUTINE X 2)
Culture  Setup Time: 201208290509
Culture  Setup Time: 201208290510
Culture: NO GROWTH

## 2011-06-28 ENCOUNTER — Other Ambulatory Visit: Payer: Medicare Other

## 2011-07-02 ENCOUNTER — Ambulatory Visit: Payer: Self-pay | Admitting: Pulmonary Disease

## 2011-07-04 ENCOUNTER — Encounter (INDEPENDENT_AMBULATORY_CARE_PROVIDER_SITE_OTHER): Payer: Medicare Other | Admitting: Cardiovascular Disease

## 2011-07-04 ENCOUNTER — Encounter: Payer: Self-pay | Admitting: Cardiovascular Disease

## 2011-07-04 NOTE — Progress Notes (Signed)
  Dr Jim Like Patient

## 2011-07-12 ENCOUNTER — Ambulatory Visit (INDEPENDENT_AMBULATORY_CARE_PROVIDER_SITE_OTHER)
Admission: RE | Admit: 2011-07-12 | Discharge: 2011-07-12 | Disposition: A | Discharge status: Home / Self Care | Payer: Medicare Other | Source: Ambulatory Visit | Attending: Pulmonary Disease | Admitting: Pulmonary Disease

## 2011-07-12 DIAGNOSIS — J984 Other disorders of lung: Secondary | ICD-10-CM

## 2011-07-12 DIAGNOSIS — R911 Solitary pulmonary nodule: Secondary | ICD-10-CM

## 2011-07-16 ENCOUNTER — Ambulatory Visit: Payer: Self-pay | Admitting: Pulmonary Disease

## 2011-07-16 ENCOUNTER — Other Ambulatory Visit (HOSPITAL_COMMUNITY): Payer: Self-pay | Admitting: Interventional Radiology

## 2011-07-16 DIAGNOSIS — N2889 Other specified disorders of kidney and ureter: Secondary | ICD-10-CM

## 2011-07-16 LAB — POCT I-STAT, CHEM 8
Calcium, Ion: 0.91 — ABNORMAL LOW
Creatinine, Ser: 1.8 — ABNORMAL HIGH
Hemoglobin: 13.6
Sodium: 141
TCO2: 29

## 2011-07-18 ENCOUNTER — Ambulatory Visit: Payer: Self-pay | Admitting: Pulmonary Disease

## 2011-07-22 ENCOUNTER — Telehealth: Payer: Self-pay | Admitting: Cardiovascular Disease

## 2011-07-22 LAB — COMPREHENSIVE METABOLIC PANEL
ALT: 10
AST: 20
Albumin: 3.6
CO2: 38 — ABNORMAL HIGH
Calcium: 8.5
GFR calc Af Amer: 52 — ABNORMAL LOW
Sodium: 144
Total Protein: 6.6

## 2011-07-22 LAB — CBC
MCHC: 33.4
Platelets: 313
RBC: 4.19 — ABNORMAL LOW
RDW: 14.1

## 2011-07-22 NOTE — Telephone Encounter (Signed)
Status of cardiac clearance. Pt was seen on 9/13. Surgery on 10/5.

## 2011-07-26 ENCOUNTER — Encounter: Payer: Self-pay | Admitting: Pulmonary Disease

## 2011-07-26 ENCOUNTER — Ambulatory Visit (INDEPENDENT_AMBULATORY_CARE_PROVIDER_SITE_OTHER): Payer: Medicare Other | Admitting: Pulmonary Disease

## 2011-07-26 DIAGNOSIS — J984 Other disorders of lung: Secondary | ICD-10-CM

## 2011-07-26 DIAGNOSIS — E678 Other specified hyperalimentation: Secondary | ICD-10-CM

## 2011-07-26 NOTE — Progress Notes (Signed)
  Subjective:    Patient ID: [name] [name], male    DOB: [birth year], 70 y.o.   MRN: [id number]  HPI Patient comes in today for preop pulmonary clearance for an upcoming left renal cryoablation.  He has known obesity hypoventilation syndrome for which he wears oxygen.  He comes in today where he feels that he is near his usual baseline, and has excellent oxygen saturations on his supplemental O2.  He denies any significant chest congestion, cough, or purulent mucus.   Review of Systems  Constitutional: Negative for fever and unexpected weight change.  HENT: Positive for congestion, rhinorrhea, sneezing and sinus pressure. Negative for ear pain, nosebleeds, sore throat, trouble swallowing, dental problem and postnasal drip.   Eyes: Negative for redness and itching.  Respiratory: Positive for cough, chest tightness and shortness of breath. Negative for wheezing.   Cardiovascular: Negative for palpitations and leg swelling.  Gastrointestinal: Negative for nausea and vomiting.  Genitourinary: Negative for dysuria.  Musculoskeletal: Negative for joint swelling.  Skin: Positive for rash.  Neurological: Negative for headaches.  Hematological: Does not bruise/bleed easily.  Psychiatric/Behavioral: Negative for dysphoric mood. The patient is not nervous/anxious.        Objective:   Physical Exam Obese male in no acute distress Nose without purulence or discharge noted Chest with minimal basilar crackles, good air movement, no wheezes Cardiac exam with regular rate and rhythm Lower extremities with 1+ edema bilaterally, no cyanosis noted Alert and oriented, moves all 4 extremities.       Assessment & Plan:

## 2011-07-26 NOTE — Patient Instructions (Signed)
You should do ok with your upcoming procedure Work on weight loss

## 2011-07-26 NOTE — Assessment & Plan Note (Signed)
The patient has obesity hypoventilation syndrome, and appears to be stable and supplemental oxygen.  He has no increased work of breathing, and nothing to suggest recurrent pulmonary infection.  I think he is stable from a pulmonary standpoint to have his upcoming procedure, but is at some increased risk for pulmonary complications with conscious sedation.  He will need to have cardiopulmonary monitoring during the procedure.

## 2011-07-29 NOTE — Telephone Encounter (Signed)
From office note on 9/13. Pt need surgical clearance. Surgery later this month.

## 2011-07-30 NOTE — Telephone Encounter (Signed)
Pts daughter returning your call

## 2011-07-30 NOTE — Telephone Encounter (Signed)
Tried to reach pt, there is no evidence in the chart that the pt was seen. He has no charge for that day. Tried to reach the pt and both his numbers are disconnected. Left a message for pts dtr to call me back. tammy at GI aware Deliah Goody

## 2011-07-31 LAB — BASIC METABOLIC PANEL
CO2: 34 — ABNORMAL HIGH
GFR calc Af Amer: 56 — ABNORMAL LOW
Glucose, Bld: 93
Potassium: 4.1
Sodium: 138

## 2011-07-31 LAB — CBC
HCT: 37.3 — ABNORMAL LOW
Hemoglobin: 12.9 — ABNORMAL LOW
MCHC: 34.6
RBC: 3.9 — ABNORMAL LOW
RDW: 13.6

## 2011-07-31 LAB — APTT: aPTT: 31

## 2011-08-05 ENCOUNTER — Telehealth: Payer: Self-pay | Admitting: Emergency Medicine

## 2011-08-05 NOTE — Telephone Encounter (Signed)
tammy made aware unable to reach pt or family. Unable to give clearance. [name] [name]

## 2011-08-05 NOTE — Telephone Encounter (Signed)
LM FOR PT DAUGHTER- BARBARA THOMAS TO SEE IF PT WAS SEEN BY DR [facility] FOR CARDIAC CLEARANCE.

## 2011-08-05 NOTE — Telephone Encounter (Signed)
[name] [name] W/ [facility] WILL CK PT RECORDS TO SEE IF HE WAS DRIVEN TO APPT FOR DR NISHAN ON 07-04-11.  SHE WILL CALL us BACK TO LET us KNOW.

## 2011-08-08 ENCOUNTER — Telehealth: Payer: Self-pay | Admitting: Emergency Medicine

## 2011-08-08 LAB — DIFFERENTIAL
Basophils Absolute: 0
Eosinophils Relative: 2
Lymphocytes Relative: 18
Monocytes Absolute: 0.5

## 2011-08-08 LAB — BASIC METABOLIC PANEL
CO2: 32
Chloride: 99
Glucose, Bld: 102 — ABNORMAL HIGH
Potassium: 4.2
Sodium: 141

## 2011-08-08 LAB — CBC
HCT: 37.9 — ABNORMAL LOW
Hemoglobin: 12.8 — ABNORMAL LOW
MCHC: 33.7
MCV: 98.6
RDW: 14.2 — ABNORMAL HIGH

## 2011-08-08 LAB — POCT CARDIAC MARKERS: Troponin i, poc: <0.05

## 2011-08-08 NOTE — Telephone Encounter (Signed)
S/W SARA AT [facility] TO HAVE THEM R/S PT APPT. WITH DR Eden Emms SO WE CAN GET CARDIAC CLEARANCE FOR HIS RENAL CRYOABLATION PROCEDURE DUE FOR 09-06-11 AT [facility].  SHE WILL CALL TO SET UP APPT.

## 2011-08-15 ENCOUNTER — Other Ambulatory Visit: Payer: Self-pay | Admitting: Family Medicine

## 2011-08-15 ENCOUNTER — Telehealth: Payer: Self-pay | Admitting: Cardiovascular Disease

## 2011-08-15 NOTE — Telephone Encounter (Signed)
Needs clearance for procedure.

## 2011-08-15 NOTE — Telephone Encounter (Signed)
This is [facility] Cardiology patient [name] [name] and [name] [name] I have never seen

## 2011-08-15 NOTE — Telephone Encounter (Signed)
N/A.  Left on hold and no one answered.

## 2011-08-15 NOTE — Telephone Encounter (Signed)
Inetta Fermo, from [place], called requesting a written cardiac clearance for pt to have a cryoablation under general anesthesia on 09/06/11 with Dr Fredia Sorrow.  Fax #=[phone].  Pt is in an assisted living home and his daughter's number is: [phone] if there are any questions.

## 2011-08-16 ENCOUNTER — Other Ambulatory Visit (HOSPITAL_COMMUNITY): Payer: Medicare Other

## 2011-08-16 NOTE — Telephone Encounter (Signed)
Spoke with tina, she is aware to call [facility]

## 2011-08-19 ENCOUNTER — Telehealth: Payer: Self-pay | Admitting: Emergency Medicine

## 2011-08-19 NOTE — Telephone Encounter (Signed)
Brett Canales w/ [facility], Lmovm that pt. Needed an appointment.  1:23pm  Spoke w/ Brett Canales and I will have Inetta Fermo call him tomorrow with all of the information for his appt. At [facility].

## 2011-08-21 ENCOUNTER — Emergency Department (HOSPITAL_COMMUNITY)
Admission: EM | Admit: 2011-08-21 | Discharge: 2011-08-21 | Disposition: A | Discharge status: Home / Self Care | Payer: Medicare Other | Attending: Emergency Medicine | Admitting: Emergency Medicine

## 2011-08-21 DIAGNOSIS — E039 Hypothyroidism, unspecified: Secondary | ICD-10-CM | POA: Insufficient documentation

## 2011-08-21 DIAGNOSIS — M129 Arthropathy, unspecified: Secondary | ICD-10-CM | POA: Insufficient documentation

## 2011-08-21 DIAGNOSIS — G309 Alzheimer's disease, unspecified: Secondary | ICD-10-CM | POA: Insufficient documentation

## 2011-08-21 DIAGNOSIS — F0280 Dementia in other diseases classified elsewhere without behavioral disturbance: Secondary | ICD-10-CM | POA: Insufficient documentation

## 2011-08-21 DIAGNOSIS — M2550 Pain in unspecified joint: Secondary | ICD-10-CM | POA: Insufficient documentation

## 2011-08-23 ENCOUNTER — Other Ambulatory Visit (HOSPITAL_COMMUNITY): Payer: Self-pay | Admitting: Radiology

## 2011-08-23 MED ORDER — CEFAZOLIN SODIUM 1-5 GM-% IV SOLN: 1.0000 g | Freq: Once | INTRAVENOUS | Status: DC

## 2011-08-27 ENCOUNTER — Encounter (HOSPITAL_COMMUNITY): Payer: Self-pay | Admitting: Pharmacy Technician

## 2011-08-30 ENCOUNTER — Other Ambulatory Visit (HOSPITAL_COMMUNITY): Payer: Medicare Other

## 2011-08-30 ENCOUNTER — Encounter (HOSPITAL_COMMUNITY): Payer: Medicare Other

## 2011-08-30 ENCOUNTER — Encounter (HOSPITAL_COMMUNITY): Payer: Self-pay

## 2011-08-30 LAB — CBC
Hemoglobin: 13.7 g/dL (ref 13.0–17.0)
MCH: 32.5 pg (ref 26.0–34.0)
RBC: 4.22 MIL/uL (ref 4.22–5.81)

## 2011-08-30 LAB — BASIC METABOLIC PANEL
BUN: 19 mg/dL (ref 6–23)
Chloride: 99 mEq/L (ref 96–112)
GFR calc Af Amer: 69 mL/min — ABNORMAL LOW (ref >90)
Potassium: 4.1 mEq/L (ref 3.5–5.1)

## 2011-08-30 LAB — PROTIME-INR
INR: 1 (ref 0.00–1.49)
Prothrombin Time: 13.4 seconds (ref 11.6–15.2)

## 2011-08-30 NOTE — Pre-Procedure Instructions (Signed)
EKG REPORT 07/16/11 FROM [name]  AND  CARDIOLOGY OFFICE NOTE WITH CLEARANCE FOR LEFT CRYOABLATION SURGERY--ON CHART PULMONARY CLEARANCE 07/26/11  FOR LEFT CRYO ABLATION SURGERY ON CHART FROM DR. CLANCE . CT CHEST REPORT 07/12/11  FROM CLIN  ON CHART.

## 2011-08-30 NOTE — Patient Instructions (Addendum)
20 [name] [name]  08/30/2011   Your procedure is scheduled on:  Friday 11/16  AT 11:30 AM         ON 09/05/11 NOAH WITH CLINIC SERVICES  AT [facility] STATES HE AND PATIENT WERE NOTIFIED BY TINA IN INTERVENTION RADIOLOGY OF SURGERY DATE CHANGE TO 11/23 AND AWARE THAT TIME OF ARRIVAL AND ALL OTHER INSTRUCTIONS REMAIN THE SAME.  Report to RADIOLOGY AT [facility]   at 8:30 AM.  Call this number if you have problems the morning of surgery:  [phone]   Remember:   .NOTHING TO EAT OR DRINK AFTER MIDNIGHT NIGHT BEFORE SURGERY..  Take these medicines the morning of surgery with A SIP OF WATER: ALPRAZOLAM - IF NEEDED,  LEVOTHYROXINE,  METOPROLOL,  PROTONIX,  CARAFATE, TRAMADOL-IF NEEDED FOR PAIN.  MAY USE RESTASIS EYE DROPS, FLONASE NASAL SPRAY AND LOTEMAX EYE DROPS.   DO IPRATROPIUM NEBULIZER TREATMENT -BEFORE COMING TO RADIOLOGY.    Do not wear jewelry, make-up or nail polish.   Do not wear lotions, powders, or perfumes. You may wear deodorant.  Do not shave 48 hours prior to surgery.  Do not bring valuables to the hospital.  Contacts, dentures or bridgework may not be worn into surgery.  Leave suitcase in the car. After surgery it may be brought to your room.  For patients admitted to the hospital, checkout time is 11:00 AM the day of discharge.   Patients discharged the day of surgery will not be allowed to drive home.  Name and phone number of your driver:      YOU MAY CALL [name] RN AT [facility] PRESURGICAL TESTING IF ANY QUESTIONS ABOUT INSTRUCTIONS FOR DAY OF SURGERY  [phone]

## 2011-09-06 ENCOUNTER — Inpatient Hospital Stay (HOSPITAL_COMMUNITY)
Admission: RE | Admit: 2011-09-06 | Discharge: 2011-09-06 | Payer: Medicare Other | Source: Ambulatory Visit | Attending: Interventional Radiology | Admitting: Interventional Radiology

## 2011-09-06 ENCOUNTER — Other Ambulatory Visit (HOSPITAL_COMMUNITY): Payer: Medicare Other

## 2011-09-10 ENCOUNTER — Other Ambulatory Visit: Payer: Self-pay | Admitting: Radiology

## 2011-09-13 ENCOUNTER — Ambulatory Visit (HOSPITAL_COMMUNITY)
Admission: RE | Admit: 2011-09-13 | Discharge: 2011-09-13 | Disposition: A | Discharge status: Home / Self Care | Payer: Medicare Other | Source: Ambulatory Visit | Attending: Interventional Radiology | Admitting: Interventional Radiology

## 2011-09-13 ENCOUNTER — Encounter (HOSPITAL_COMMUNITY): Payer: Self-pay | Admitting: *Deleted

## 2011-09-13 ENCOUNTER — Ambulatory Visit (HOSPITAL_COMMUNITY)
Admission: RE | Admit: 2011-09-13 | Discharge: 2011-09-14 | Payer: Medicare Other | Source: Ambulatory Visit | Attending: Interventional Radiology | Admitting: Interventional Radiology

## 2011-09-13 ENCOUNTER — Encounter (HOSPITAL_COMMUNITY)
Admission: RE | Disposition: A | Discharge status: Other Acute Inpatient Hospital | Payer: Self-pay | Source: Ambulatory Visit | Attending: Interventional Radiology

## 2011-09-13 ENCOUNTER — Other Ambulatory Visit (HOSPITAL_COMMUNITY): Payer: Self-pay | Admitting: Interventional Radiology

## 2011-09-13 DIAGNOSIS — I2510 Atherosclerotic heart disease of native coronary artery without angina pectoris: Secondary | ICD-10-CM | POA: Insufficient documentation

## 2011-09-13 DIAGNOSIS — J449 Chronic obstructive pulmonary disease, unspecified: Secondary | ICD-10-CM | POA: Insufficient documentation

## 2011-09-13 DIAGNOSIS — N2889 Other specified disorders of kidney and ureter: Secondary | ICD-10-CM

## 2011-09-13 DIAGNOSIS — K219 Gastro-esophageal reflux disease without esophagitis: Secondary | ICD-10-CM | POA: Insufficient documentation

## 2011-09-13 DIAGNOSIS — E662 Morbid (severe) obesity with alveolar hypoventilation: Secondary | ICD-10-CM | POA: Insufficient documentation

## 2011-09-13 DIAGNOSIS — J4489 Other specified chronic obstructive pulmonary disease: Secondary | ICD-10-CM | POA: Insufficient documentation

## 2011-09-13 DIAGNOSIS — Z01812 Encounter for preprocedural laboratory examination: Secondary | ICD-10-CM | POA: Insufficient documentation

## 2011-09-13 DIAGNOSIS — E039 Hypothyroidism, unspecified: Secondary | ICD-10-CM | POA: Insufficient documentation

## 2011-09-13 DIAGNOSIS — Z79899 Other long term (current) drug therapy: Secondary | ICD-10-CM | POA: Insufficient documentation

## 2011-09-13 DIAGNOSIS — G4700 Insomnia, unspecified: Secondary | ICD-10-CM

## 2011-09-13 DIAGNOSIS — N289 Disorder of kidney and ureter, unspecified: Secondary | ICD-10-CM | POA: Insufficient documentation

## 2011-09-13 DIAGNOSIS — F0280 Dementia in other diseases classified elsewhere without behavioral disturbance: Secondary | ICD-10-CM | POA: Insufficient documentation

## 2011-09-13 DIAGNOSIS — Z7982 Long term (current) use of aspirin: Secondary | ICD-10-CM | POA: Insufficient documentation

## 2011-09-13 DIAGNOSIS — G309 Alzheimer's disease, unspecified: Secondary | ICD-10-CM | POA: Insufficient documentation

## 2011-09-13 DIAGNOSIS — Z96659 Presence of unspecified artificial knee joint: Secondary | ICD-10-CM | POA: Insufficient documentation

## 2011-09-13 DIAGNOSIS — G4730 Sleep apnea, unspecified: Secondary | ICD-10-CM | POA: Insufficient documentation

## 2011-09-13 LAB — TYPE AND SCREEN
ABO/RH(D): O POS
Antibody Screen: NEGATIVE

## 2011-09-13 SURGERY — RADIO FREQUENCY ABLATION
Anesthesia: General | Laterality: Left | Wound class: Clean

## 2011-09-13 MED ORDER — SODIUM CHLORIDE 0.9 % IV SOLN
INTRAVENOUS | Status: DC
  Administered 2011-09-13: 22:00:00 via INTRAVENOUS

## 2011-09-13 MED ORDER — LIDOCAINE HCL (CARDIAC) 20 MG/ML IV SOLN
INTRAVENOUS | Status: DC | PRN
  Administered 2011-09-13: 50 mg via INTRAVENOUS

## 2011-09-13 MED ORDER — LACTATED RINGERS IV SOLN
INTRAVENOUS | Status: DC | PRN
  Administered 2011-09-13 (×2): via INTRAVENOUS

## 2011-09-13 MED ORDER — PROMETHAZINE HCL 25 MG/ML IJ SOLN: 12.5000 mg | INTRAMUSCULAR | Status: DC | PRN

## 2011-09-13 MED ORDER — PHENYLEPHRINE HCL 10 MG/ML IJ SOLN
INTRAMUSCULAR | Status: DC | PRN
  Administered 2011-09-13 (×2): 50 ug via INTRAVENOUS

## 2011-09-13 MED ORDER — FENTANYL CITRATE 0.05 MG/ML IJ SOLN
INTRAMUSCULAR | Status: DC | PRN
  Administered 2011-09-13: 100 ug via INTRAVENOUS
  Administered 2011-09-13 (×2): 50 ug via INTRAVENOUS

## 2011-09-13 MED ORDER — KCL-LACTATED RINGERS 20 MEQ/L IV SOLN
INTRAVENOUS | Status: DC
  Filled 2011-09-13: qty 1000

## 2011-09-13 MED ORDER — FENTANYL CITRATE 0.05 MG/ML IJ SOLN
INTRAMUSCULAR | Status: AC
  Filled 2011-09-13: qty 2

## 2011-09-13 MED ORDER — NEOSTIGMINE METHYLSULFATE 1 MG/ML IJ SOLN
INTRAMUSCULAR | Status: DC | PRN
  Administered 2011-09-13: 5 mg via INTRAVENOUS

## 2011-09-13 MED ORDER — FENTANYL CITRATE 0.05 MG/ML IJ SOLN: 25.0000 ug | INTRAMUSCULAR | Status: DC | PRN

## 2011-09-13 MED ORDER — PROMETHAZINE HCL 25 MG/ML IJ SOLN
INTRAMUSCULAR | Status: AC
  Filled 2011-09-13: qty 1

## 2011-09-13 MED ORDER — SENNOSIDES-DOCUSATE SODIUM 8.6-50 MG PO TABS
1.0000 | ORAL_TABLET | Freq: Every day | ORAL | Status: DC | PRN
  Filled 2011-09-13: qty 1

## 2011-09-13 MED ORDER — CEFAZOLIN SODIUM 1-5 GM-% IV SOLN
1.0000 g | Freq: Once | INTRAVENOUS | Status: AC
  Administered 2011-09-13: 1 g via INTRAVENOUS
  Filled 2011-09-13: qty 50

## 2011-09-13 MED ORDER — HYDROCODONE-ACETAMINOPHEN 5-325 MG PO TABS
1.0000 | ORAL_TABLET | ORAL | Status: DC | PRN
  Administered 2011-09-13: 2 via ORAL
  Administered 2011-09-14: 1 via ORAL
  Filled 2011-09-13 (×2): qty 2

## 2011-09-13 MED ORDER — PROPOFOL 10 MG/ML IV EMUL
INTRAVENOUS | Status: DC | PRN
  Administered 2011-09-13: 160 mg via INTRAVENOUS
  Administered 2011-09-13: 50 mg via INTRAVENOUS

## 2011-09-13 MED ORDER — DOCUSATE SODIUM 100 MG PO CAPS
100.0000 mg | ORAL_CAPSULE | Freq: Two times a day (BID) | ORAL | Status: DC
  Administered 2011-09-13 – 2011-09-14 (×2): 100 mg via ORAL
  Filled 2011-09-13 (×5): qty 1

## 2011-09-13 MED ORDER — SUCCINYLCHOLINE CHLORIDE 20 MG/ML IJ SOLN
INTRAMUSCULAR | Status: DC | PRN
  Administered 2011-09-13: 100 mg via INTRAVENOUS

## 2011-09-13 MED ORDER — ONDANSETRON HCL 4 MG/2ML IJ SOLN
4.0000 mg | Freq: Four times a day (QID) | INTRAMUSCULAR | Status: DC
  Administered 2011-09-14 (×3): 4 mg via INTRAVENOUS
  Filled 2011-09-13 (×3): qty 2

## 2011-09-13 MED ORDER — LACTATED RINGERS IV SOLN: INTRAVENOUS | Status: DC

## 2011-09-13 MED ORDER — PROMETHAZINE HCL 25 MG/ML IJ SOLN
6.2500 mg | INTRAMUSCULAR | Status: DC | PRN
  Administered 2011-09-13: 6.25 mg via INTRAVENOUS

## 2011-09-13 MED ORDER — ZOLPIDEM TARTRATE 10 MG PO TABS
10.0000 mg | ORAL_TABLET | Freq: Every evening | ORAL | Status: DC | PRN
  Administered 2011-09-13: 10 mg via ORAL
  Filled 2011-09-13: qty 1

## 2011-09-13 MED ORDER — EPHEDRINE SULFATE 50 MG/ML IJ SOLN
INTRAMUSCULAR | Status: DC | PRN
  Administered 2011-09-13: 20 mg via INTRAVENOUS
  Administered 2011-09-13 (×5): 10 mg via INTRAVENOUS
  Administered 2011-09-13: 15 mg via INTRAVENOUS
  Administered 2011-09-13: 5 mg via INTRAVENOUS
  Administered 2011-09-13: 10 mg via INTRAVENOUS
  Administered 2011-09-13: 5 mg via INTRAVENOUS
  Administered 2011-09-13: 15 mg via INTRAVENOUS

## 2011-09-13 MED ORDER — ROCURONIUM BROMIDE 100 MG/10ML IV SOLN
INTRAVENOUS | Status: DC | PRN
  Administered 2011-09-13: 5 mg via INTRAVENOUS
  Administered 2011-09-13: 10 mg via INTRAVENOUS
  Administered 2011-09-13: 40 mg via INTRAVENOUS
  Administered 2011-09-13: 5 mg via INTRAVENOUS

## 2011-09-13 MED ORDER — ONDANSETRON HCL 4 MG/2ML IJ SOLN
INTRAMUSCULAR | Status: DC | PRN
  Administered 2011-09-13: 4 mg via INTRAVENOUS

## 2011-09-13 MED ORDER — GLYCOPYRROLATE 0.2 MG/ML IJ SOLN
INTRAMUSCULAR | Status: DC | PRN
  Administered 2011-09-13: .8 mg via INTRAVENOUS

## 2011-09-13 NOTE — Anesthesia Procedure Notes (Addendum)
Procedure Name: Intubation Date/Time: 09/13/2011 1:02 PM Performed by: Trellis Paganini Patient Re-evaluated:Patient Re-evaluated prior to inductionOxygen Delivery Method: Circle System Utilized Preoxygenation: Pre-oxygenation with 100% oxygen Intubation Type: IV induction and Rapid sequence Tube type: Oral Tube size: 7.5 mm Number of attempts: 2 Airway Equipment and Method: bougie stylet Placement Confirmation: positive ETCO2 and breath sounds checked- equal and bilateral Secured at: 22 cm Tube secured with: Tape Difficulty Due To: Difficult Airway- due to limited oral opening, Difficult Airway- due to dentition and Difficult Airway- due to anterior larynx

## 2011-09-13 NOTE — Procedures (Signed)
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  18G core biopsy x 2 via 17G needle.  Perc cryoablation w/ 4 total Endocare Perc-24 probes.  10 min freeze cycles x 2 separated by 8 min thaw. No immediate complications. Plan:  PACU recovery followed by overnight observation.  Check labs in AM.

## 2011-09-13 NOTE — Transfer of Care (Signed)
Immediate Anesthesia Transfer of Care Note  Patient: [name] [name]  Procedure(s) Performed: * No procedures listed *  Patient Location: PACU  Anesthesia Type: General  Level of Consciousness: sedated  Airway & Oxygen Therapy: Patient Spontanous Breathing and Patient connected to face mask oxygen  Post-op Assessment: Report given to PACU RN and Post -op Vital signs reviewed and stable  Post vital signs: Reviewed and stable  Complications: No apparent anesthesia complications

## 2011-09-13 NOTE — Anesthesia Preprocedure Evaluation (Deleted)
Anesthesia Evaluation  Patient identified by MRN, date of birth, ID band Patient awake  General Assessment Comment:Increased CV risk  Reviewed: Allergy & Precautions, H&P , NPO status , Patient's Chart, lab work & pertinent test results, reviewed documented beta blocker date and time   Airway Mallampati: II TM Distance: >3 FB Neck ROM: Full    Dental   Pulmonary neg pulmonary ROS, sleep apnea and Oxygen sleep apnea ,    + decreased breath sounds      Cardiovascular hypertension, Pt. on medications + CAD Regular Normal Cardiac eval Sept 12, CAD, moderate risk, no testing indicated EKG June 12- SR   Neuro/Psych On Chart, hx of dementia Negative Psych ROS   GI/Hepatic negative GI ROS, Neg liver ROS,   Endo/Other  Hypothyroidism Morbid obesityThyroid replacement  Renal/GU Left renal tumor  Genitourinary negative   Musculoskeletal negative musculoskeletal ROS (+)   Abdominal   Peds negative pediatric ROS (+)  Hematology negative hematology ROS (+)   Anesthesia Other Findings Poor dentition  Reproductive/Obstetrics negative OB ROS                           Anesthesia Physical Anesthesia Plan  ASA: III  Anesthesia Plan: General   Post-op Pain Management:    Induction: Intravenous  Airway Management Planned: Oral ETT  Additional Equipment:   Intra-op Plan:   Post-operative Plan: Extubation in OR  Informed Consent: I have reviewed the patients History and Physical, chart, labs and discussed the procedure including the risks, benefits and alternatives for the proposed anesthesia with the patient or authorized representative who has indicated his/her understanding and acceptance.   Dental advisory given  Plan Discussed with: CRNA and Surgeon  Anesthesia Plan Comments:         Anesthesia Quick Evaluation

## 2011-09-13 NOTE — H&P (Signed)
[name] [name] is an 70 y.o. male.   Chief Complaint: Left kidney mass HPI: Patient with history of slowly growing left lower pole renal mass(3.6-3.8 cm) presents today for elective CT guided cryoablation/biopsy.  Past Medical History  Diagnosis Date  . Other congenital hamartoses, not elsewhere classified   . Family history of ischemic heart disease   . Family history of malignant neoplasm of gastrointestinal tract   . Allergic rhinitis, cause unspecified   . Other and unspecified hyperlipidemia   . Unspecified essential hypertension   . Obesity hypoventilation syndrome     failed cpap/bipap trial  . Hypothyroidism   . GERD (gastroesophageal reflux disease)   . Arthritis     osteoarthritis  . DEMENTIA     Alzheimer's, senile dementia  . Shortness of breath   . Cancer     renal carcinoma  . Sleep apnea     OXYGEN 24 HR/DAY-NO CPAP  . COPD (chronic obstructive pulmonary disease)     DR. Marcelyn Bruins FOLLOWS PT--PT USING OXYGEN 24 HRS A DAY  . COPD (chronic obstructive pulmonary disease)     PULMONARY CLEARANCE FOR LEFT RENAL CRYOABLATION ON CHART FROM DR. CLANCE -07/26/11  . Coronary artery disease     CARDIAC CLEARANCE  OFFICE NOTE FOR LEFT CRYO ABLATION ON CHART FROM [name] -"RECENT STRESS TEST DID NOT SHOW ISCHEMIA" -"PRESUMED CAD DUE TO CORONARY CALCIFICATIONS"   obesity hypoventilation syndrome, fibromyalgia  Past Surgical History  Procedure Date  . Eye surgery     bilateral cataracts  . Joint replacement     bilateral knee replacements  . Carpal tunnel release     right  . Thyroidectomy   . Cholecystectomy 2009  . Knee arthroplasty     2005 LEFT   AND 2007 RIGHT--[name]    Family History  Problem Relation Age of Onset  . Cancer Other     colon  . Heart disease Other     CAD, MI and Heart Attack  Allergies: no known drug allergies Social History : retired, lives at [facility], denies alcohol or tobacco use                                                                                                                                                                                                                                                                                                                     :  Medications Prior to Admission  Medication Sig Dispense Refill  . acetaminophen (TYLENOL) 325 MG tablet Take 650 mg by mouth every 6 (six) hours as needed. pain      . ALPRAZolam (XANAX) 0.5 MG tablet Take 0.5 mg by mouth 2 (two) times daily.       Marland Kitchen aluminum & magnesium hydroxide (MAALOX) 225-200 MG/5ML suspension Take 30 mLs by mouth every 6 (six) hours as needed. Heart burn and upset stomach      . aspirin 325 MG tablet Take 325 mg by mouth at bedtime.       . bisacodyl (DULCOLAX) 5 MG EC tablet Take 10 mg by mouth daily as needed. constipation      . calcitRIOL (ROCALTROL) 0.5 MCG capsule Take 0.5 mcg by mouth 2 (two) times daily.       . Calcium Carbonate-Vitamin D (CALCIUM-VITAMIN D) 500-200 MG-UNIT per tablet Take 1 tablet by mouth 2 (two) times daily with a meal.       . clopidogrel (PLAVIX) 75 MG tablet Take 75 mg by mouth every morning.       . cycloSPORINE (RESTASIS) 0.05 % ophthalmic emulsion Place 1 drop into both eyes 2 (two) times daily.       . fluticasone (FLONASE) 50 MCG/ACT nasal spray Place 2 sprays into the nose daily as needed. congestion      . ipratropium (ATROVENT) 0.02 % nebulizer solution Take 500 mcg by nebulization every 6 (six) hours as needed. Shortness of breath    DOSE ON NURSING HOME MED RECORD IS 0.2MG /ML EVERY 6 HOURS AS NEEDED      . levothyroxine (SYNTHROID, LEVOTHROID) 150 MCG tablet Take 150 mcg by mouth every morning.       Marland Kitchen LORazepam (ATIVAN) 0.5 MG tablet Take 0.5 mg by mouth 3 (three) times daily as needed. agitation      . loteprednol (LOTEMAX) 0.5 % ophthalmic suspension Place 1 drop into both eyes every morning.       Marland Kitchen MELATONIN SL Place 2.5 mg under the tongue at bedtime.       .  metoprolol tartrate (LOPRESSOR) 25 MG tablet Take 12.5 mg by mouth 2 (two) times daily.       . pantoprazole (PROTONIX) 40 MG tablet Take 40 mg by mouth daily.       . polyethylene glycol (MIRALAX / GLYCOLAX) packet Take 17 g by mouth daily as needed. constipation      . promethazine (PHENERGAN) 12.5 MG tablet Take 12.5 mg by mouth every 8 (eight) hours as needed. nausea      . simethicone (GAS-X) 80 MG chewable tablet Chew 80 mg by mouth 4 (four) times daily. 4 times daily       . sucralfate (CARAFATE) 1 G tablet Take 1 g by mouth 4 (four) times daily.       . Tamsulosin HCl (FLOMAX) 0.4 MG CAPS Take 0.4 mg by mouth daily.       . traMADol (ULTRAM) 50 MG tablet Take 50 mg by mouth every 8 (eight) hours as needed. Headache/pain       . zolpidem (AMBIEN) 10 MG tablet Take 10 mg by mouth at bedtime.        Medications Prior to Admission  Medication Dose Route Frequency Provider Last Rate Last Dose  . ceFAZolin (ANCEF) IVPB 1 g/50 mL premix  1 g Intravenous Once Robet Leu, PA      . lactated ringers infusion   Intravenous Continuous Walton Cellar  Carleene Mains, PA      . lactated ringers with KCl 20 mEq/L infusion   Intravenous Continuous Robet Leu, PA        Results for orders placed during the hospital encounter of 09/13/11 (from the past 48 hour(s))  TYPE AND SCREEN     Status: Normal (Preliminary result)   Collection Time   09/13/11  9:30 AM      Component Value Range Comment   ABO/RH(D) O POS      Antibody Screen PENDING      Sample Expiration 09/16/2011      Labs: dated 08/30/11  WBC 7.7  HGB 13.7  PLT 189K    BUN 19  CR 1.19  K 4.1 NA 139  PT 13.4  INR 1.00  PTT 36         CT A/P 04/11/2011- 3.7 X 3.8 cm left lower pole renal mass, non obstructing left renal stone   Review of Systems  Constitutional: Negative for fever and chills.  Respiratory: Negative for cough and shortness of breath.   Cardiovascular: Negative for chest pain.  Gastrointestinal: Positive for abdominal pain.  Negative for nausea and vomiting.       Occ abd pain  Genitourinary: Negative for dysuria and hematuria.  Musculoskeletal: Positive for myalgias, back pain and joint pain.       Has left knee pain  Neurological: Negative for headaches.  Endo/Heme/Allergies: Does not bruise/bleed easily.    Blood pressure 108/69, pulse 65, temperature 97.9 F (36.6 C), SpO2 100.00%. Physical Exam  Constitutional: He is oriented to person, place, and time. He appears well-developed and well-nourished.  Cardiovascular: Normal rate and regular rhythm.   Respiratory: Effort normal and breath sounds normal. He has no wheezes.       Mildly diminished basilar breath sounds, greater on left  GI: Soft. Bowel sounds are normal. There is no tenderness.       obese  Musculoskeletal: Normal range of motion. He exhibits edema.       Trace edema  Neurological: He is alert and oriented to person, place, and time.  Psychiatric: He has a normal mood and affect.     Assessment/Plan Patient with history of left lower pole renal mass (3.8 cm), suspicious for renal cell carcinoma; plan is for CT guided cryoablation/biopsy with general anesthesia,followed by overnight observation.  Donzella Carrol,D [facility] 09/13/2011, 10:19 AM

## 2011-09-13 NOTE — Anesthesia Preprocedure Evaluation (Addendum)
Anesthesia Evaluation Anesthesia Physical Anesthesia Plan  ASA: III  Anesthesia Plan:    Post-op Pain Management:    Induction:   Airway Management Planned:   Additional Equipment:   Intra-op Plan:   Post-operative Plan:   Informed Consent:   Plan Discussed with:   Anesthesia Plan Comments:         Anesthesia Quick Evaluation  

## 2011-09-13 NOTE — Anesthesia Postprocedure Evaluation (Signed)
  Anesthesia Post-op Note  Patient: [name] [name]  Procedure(s) Performed: * No procedures listed *  Patient Location: PACU  Anesthesia Type: General  Level of Consciousness: oriented and sedated  Airway and Oxygen Therapy: Patient Spontanous Breathing and Patient connected to nasal cannula oxygen  Post-op Pain: mild  Post-op Assessment: Post-op Vital signs reviewed, Patient's Cardiovascular Status Stable, Respiratory Function Stable and Patent Airway  Post-op Vital Signs: stable  Complications: No apparent anesthesia complications

## 2011-09-13 NOTE — H&P (Signed)
Agree with above.  Scheduled for left renal cryoablation and biopsy today.

## 2011-09-13 NOTE — Preoperative (Signed)
Beta Blockers   Reason not to administer Beta Blockers:Not Applicable, pt took BB this am 

## 2011-09-14 ENCOUNTER — Emergency Department (HOSPITAL_COMMUNITY)
Admission: EM | Admit: 2011-09-14 | Discharge: 2011-09-15 | Disposition: A | Discharge status: Home / Self Care | Payer: Medicare Other | Attending: Emergency Medicine | Admitting: Emergency Medicine

## 2011-09-14 ENCOUNTER — Encounter (HOSPITAL_COMMUNITY): Payer: Self-pay | Admitting: Emergency Medicine

## 2011-09-14 DIAGNOSIS — W19XXXA Unspecified fall, initial encounter: Secondary | ICD-10-CM

## 2011-09-14 DIAGNOSIS — I2510 Atherosclerotic heart disease of native coronary artery without angina pectoris: Secondary | ICD-10-CM | POA: Insufficient documentation

## 2011-09-14 DIAGNOSIS — R338 Other retention of urine: Secondary | ICD-10-CM | POA: Insufficient documentation

## 2011-09-14 DIAGNOSIS — F0280 Dementia in other diseases classified elsewhere without behavioral disturbance: Secondary | ICD-10-CM | POA: Insufficient documentation

## 2011-09-14 DIAGNOSIS — N2889 Other specified disorders of kidney and ureter: Secondary | ICD-10-CM | POA: Insufficient documentation

## 2011-09-14 DIAGNOSIS — G309 Alzheimer's disease, unspecified: Secondary | ICD-10-CM | POA: Insufficient documentation

## 2011-09-14 DIAGNOSIS — W06XXXA Fall from bed, initial encounter: Secondary | ICD-10-CM | POA: Insufficient documentation

## 2011-09-14 DIAGNOSIS — Y9210 Unspecified residential institution as the place of occurrence of the external cause: Secondary | ICD-10-CM | POA: Insufficient documentation

## 2011-09-14 DIAGNOSIS — Z85528 Personal history of other malignant neoplasm of kidney: Secondary | ICD-10-CM | POA: Insufficient documentation

## 2011-09-14 DIAGNOSIS — Z9889 Other specified postprocedural states: Secondary | ICD-10-CM | POA: Insufficient documentation

## 2011-09-14 DIAGNOSIS — Z79899 Other long term (current) drug therapy: Secondary | ICD-10-CM | POA: Insufficient documentation

## 2011-09-14 DIAGNOSIS — R109 Unspecified abdominal pain: Secondary | ICD-10-CM | POA: Insufficient documentation

## 2011-09-14 HISTORY — PX: TUMOR REMOVAL: SHX12

## 2011-09-14 LAB — TYPE AND SCREEN: Antibody Screen: NEGATIVE

## 2011-09-14 LAB — CBC
HCT: 36.2 % — ABNORMAL LOW (ref 39.0–52.0)
MCHC: 32.3 g/dL (ref 30.0–36.0)
Platelets: 159 10*3/uL (ref 150–400)
RDW: 13.8 % (ref 11.5–15.5)

## 2011-09-14 LAB — BASIC METABOLIC PANEL
BUN: 20 mg/dL (ref 6–23)
Calcium: 8.1 mg/dL — ABNORMAL LOW (ref 8.4–10.5)
Chloride: 97 mEq/L (ref 96–112)
Creatinine, Ser: 1.2 mg/dL (ref 0.50–1.35)
GFR calc Af Amer: 68 mL/min — ABNORMAL LOW (ref >90)
GFR calc non Af Amer: 59 mL/min — ABNORMAL LOW (ref >90)

## 2011-09-14 NOTE — Progress Notes (Signed)
Resident of [facility]- observation status. OK per MD for d/c back to facility today. Patient requires continuous O2. Spoke to [name] at [facility]- they do no have transporation for the weekend. EMS arranged; patient's walker to be placed in [employer] storage and their driver will pick up walker on Monday. Patient states he has another walker at the facility.  Pt's nurse notified.  Patient is pleased with d/c. He states that his daughter is out of town. Darylene Price, BSW,09/14/2011 3:49 PM

## 2011-09-14 NOTE — Discharge Summary (Signed)
Physician Discharge Summary  Patient ID: [name] [name] MRN: [id number] DOB/AGE: [id number] 72 y.o.  Admit date: 09/13/2011 Discharge date: 09/14/2011  Admission Diagnoses: Primary: Left renal mass    Secondary: Obesity hypoventilation syndrome, hypothyroidism, GERD, osteoarthritis, dementia, COPD, coronary artery disease , fibromyalgia  Discharge Diagnoses: Status post CT guided percutaneous core biopsy and cryoablation of left renal mass .   Discharged Condition: good  Hospital Course: [name] [name] is a 70 year old male with history of a slowly growing left lower pole renal mass of approximately 3.8 cm who underwent CT-guided percutaneous core biopsy and cryoablation of the left renal mass on 11/23 by Dr. Fredia Sorrow under general anesthesia . Patient was subsequently admitted for overnight observation and hemodynamic monitoring. The patient did well overnight with no significant complaints. Vital signs remained stable and the patient was afebrile. Patient was able to void without difficulty and tolerated his diet well. There was no evidence of hematuria. Followup laboratory studies 11/24 revealed a BUN of 20, a creatinine of 1.20, potassium of 4.2, hemoglobin was 11.7, platelet count 159,000, white blood cell count 8.1  Consults: none  Significant Diagnostic Studies: CT of the abdomen the day of the cryoablation revealed a solid tumor emanating from the lower pole of the left kidney measuring approximately 3.9 cm in greatest diameter.     Discharge Exam: Patient is awake alert and oriented x3. Heart is regular rate and rhythm without murmur. Chest with few left basilar crackles. Abdomen soft, nontender, with positive bowel sounds. Puncture site left flank region with mild tenderness to palpation there is no evidence of hematoma. Blood pressure 108/69, pulse 65, temperature 97.9 F (36.6 C), SpO2 100.00%.   Disposition: the patient will be discharged back to [facility] .He'll be  scheduled for followup CT imaging of the abdomen and visit with Dr.Yamagata in approximately 4 weeks in the  interventional radiology clinic . Patient was told to resume all his current medications with the exception of Plavix which he was told to resume on 09/15/2011.   Discharge medications:. acetaminophen (TYLENOL) 325 MG tablet ALPRAZolam (XANAX) 0.5 MG tablet aluminum & magnesium hydroxide (MAALOX) 225-200 MG/5ML suspension aspirin 325 MG tablet bisacodyl (DULCOLAX) 5 MG EC tablet calcitRIOL (ROCALTROL) 0.5 MCG capsule Calcium Carbonate-Vitamin D (CALCIUM-VITAMIN D) 500-200 MG-UNIT per tablet clopidogrel (PLAVIX) 75 MG tablet cycloSPORINE (RESTASIS) 0.05 % ophthalmic emulsion fluticasone (FLONASE) 50 MCG/ACT nasal spray ipratropium (ATROVENT) 0.02 % nebulizer solution levothyroxine (SYNTHROID, LEVOTHROID) 150 MCG tablet LORazepam (ATIVAN) 0.5 MG tablet loteprednol (LOTEMAX) 0.5 % ophthalmic suspension MELATONIN SL metoprolol tartrate (LOPRESSOR) 25 MG tablet pantoprazole (PROTONIX) 40 MG tablet polyethylene glycol (MIRALAX / GLYCOLAX) packet promethazine (PHENERGAN) 12.5 MG tablet simethicone (GAS-X) 80 MG chewable tablet sucralfate (CARAFATE) 1 G tablet Tamsulosin HCl (FLOMAX) 0.4 MG CAPS traMADol (ULTRAM) 50 MG tablet zolpidem (AMBIEN) 10 MG tablet    Signed: Roanne Haye,D K[facility]IN 09/14/2011, 12:00 PM

## 2011-09-14 NOTE — Progress Notes (Signed)
Subjective: Patient without new c/o; no hematuria or increased flank pain.   Objective: Vital signs in last 24 hours: Temp:  [97 F (36.1 C)-98.1 F (36.7 C)] 98.1 F (36.7 C) (11/24 0806) Pulse Rate:  [65-99] 90  (11/24 0806) Resp:  [16-20] 20  (11/24 0206) BP: (95-148)/(56-85) 106/69 mmHg (11/24 0806) SpO2:  [93 %-100 %] 93 % (11/24 0806) Weight:  [263 lb (119.296 kg)] 263 lb (119.296 kg) (11/23 1815)    Intake/Output from previous day:   Intake/Output this shift:  Exam: chest - few left basilar crackles, card - RRR, abd- obese,soft, positive BS, ext- trace edema bilat.;   Puncture site left flank with mild tenderness, no hematoma    Lab Results:  No results found for this basename: WBC:2,HGB:2,HCT:2,PLT:2 in the last 72 hours BMET No results found for this basename: NA:2,K:2,CL:2,CO2:2,GLUCOSE:2,BUN:2,CREATININE:2,CALCIUM:2 in the last 72 hours PT/INR No results found for this basename: LABPROT:2,INR:2 in the last 72 hours ABG No results found for this basename: PHART:2,PCO2:2,PO2:2,HCO3:2 in the last 72 hours  Studies/Results: Ct Guide Tissue Ablation  09/13/2011  *RADIOLOGY REPORT*  Clinical Data:  Left renal mass.  The patient presents for biopsy and percutaneous cryoablation.  CT-GUIDED CORE BIOPSY OF LEFT RENAL MASS. CT-GUIDED PERCUTANEOUS CRYOABLATION OF LEFT RENAL MASS.  Anesthesia:  General  Medications:  1 gram IV Ancef. As antibiotic prophylaxis, Ancef 1 gm was ordered pre-procedure and administered intravenously within one hour of incision.  Procedure:  The procedure, risks, benefits, and alternatives were explained to the patient.  Questions regarding the procedure were encouraged and answered.  The patient understands and consents to the procedure.  The patient was placed under general anesthesia.  Initial unenhanced CT was performed in a prone position to localize the left renal mass.  The left flank region was prepped with Betadine in a sterile fashion, and a  sterile drape was applied covering the operative field.  A sterile gown and sterile gloves were used for the procedure.  A 17 gauge trocar needle was advanced into the left renal mass. Core biopsy was performed with an 18 gauge automated core biopsy device.  A total of 2 core samples were submitted in formalin for pathologic analysis.  Under CT guidance, a total of four [employer] Perc-24 percutaneous cryoablation probes were advanced into the left renal tumor.  Probe positioning was confirmed by CT prior to cryoablation.  Cryoablation was performed through the probes simultaneously. Initial 10 minute cycle of cryoablation was performed.  This was followed by a 8 minute thaw cycle.  A second 10 minute cycle of cryoablation was then performed.  During ablation, periodic CT imaging was performed to monitor ice ball formation and morphology. After active thaw, the cryoablation probes were removed.  Post-procedural CT was performed.  Complications: None  Findings:  Solid tumor emanating from the lower pole of the left kidney measures approximately 3.9 cm in greatest diameter.  Solid tissue was obtained with core biopsy.  Based on tumor size and morphology, four separate cryoablation probes were advanced into the tumor in a tandem array.  Monitoring during cryoablation shows adequate ice ball formation which appears to encompass the tumor. There were no immediate complications.  IMPRESSION:  CT guided percutaneous core biopsy and cryoablation of left renal tumor.  The patient will be observed overnight.  Initial follow-up will be performed in approximately 4 weeks.  Original Report Authenticated By: Reola Calkins, M.D.   Ct Biopsy  09/13/2011  *RADIOLOGY REPORT*  Clinical Data:  Left renal mass.  The patient presents for biopsy and percutaneous cryoablation.  CT-GUIDED CORE BIOPSY OF LEFT RENAL MASS. CT-GUIDED PERCUTANEOUS CRYOABLATION OF LEFT RENAL MASS.  Anesthesia:  General  Medications:  1 gram IV Ancef. As  antibiotic prophylaxis, Ancef 1 gm was ordered pre-procedure and administered intravenously within one hour of incision.  Procedure:  The procedure, risks, benefits, and alternatives were explained to the patient.  Questions regarding the procedure were encouraged and answered.  The patient understands and consents to the procedure.  The patient was placed under general anesthesia.  Initial unenhanced CT was performed in a prone position to localize the left renal mass.  The left flank region was prepped with Betadine in a sterile fashion, and a sterile drape was applied covering the operative field.  A sterile gown and sterile gloves were used for the procedure.  A 17 gauge trocar needle was advanced into the left renal mass. Core biopsy was performed with an 18 gauge automated core biopsy device.  A total of 2 core samples were submitted in formalin for pathologic analysis.  Under CT guidance, a total of four [employer]ocare Perc-24 percutaneous cryoablation probes were advanced into the left renal tumor.  Probe positioning was confirmed by CT prior to cryoablation.  Cryoablation was performed through the probes simultaneously. Initial 10 minute cycle of cryoablation was performed.  This was followed by a 8 minute thaw cycle.  A second 10 minute cycle of cryoablation was then performed.  During ablation, periodic CT imaging was performed to monitor ice ball formation and morphology. After active thaw, the cryoablation probes were removed.  Post-procedural CT was performed.  Complications: None  Findings:  Solid tumor emanating from the lower pole of the left kidney measures approximately 3.9 cm in greatest diameter.  Solid tissue was obtained with core biopsy.  Based on tumor size and morphology, four separate cryoablation probes were advanced into the tumor in a tandem array.  Monitoring during cryoablation shows adequate ice ball formation which appears to encompass the tumor. There were no immediate complications.   IMPRESSION:  CT guided percutaneous core biopsy and cryoablation of left renal tumor.  The patient will be observed overnight.  Initial follow-up will be performed in approximately 4 weeks.  Original Report Authenticated By: Reola Calkins, M.D.    Anti-infectives: Anti-infectives     Start     Dose/Rate Route Frequency Ordered Stop   09/13/11 0915   ceFAZolin (ANCEF) IVPB 1 g/50 mL premix        1 g 100 mL/hr over 30 Minutes Intravenous  Once 09/13/11 4098 09/13/11 1340          Assessment/Plan: s/p CT guided left renal mass core biopsy/ cryoablation 11/23; plan is for d/c home today if f/u labs ok; f/u imaging (CT) and visit with Dr. Fredia Sorrow in 4 weeks. Continue f/u with Dr. Mena Goes.   LOS: 0 days    Trinka Keshishyan,D [facility] 09/14/2011

## 2011-09-14 NOTE — ED Notes (Signed)
Patient brought in by EMS for urinary retention, post surgical. Patient was d/c'd from the hospital today where he had a tumor removed from his left kidney. The patient has had very little urine output since leaving the hospital. Patient having 10/10 abdominal/bladder pain.

## 2011-09-15 LAB — URINALYSIS, ROUTINE W REFLEX MICROSCOPIC
Bilirubin Urine: NEGATIVE
Protein, ur: 30 mg/dL — AB
Urobilinogen, UA: 0.2 mg/dL (ref 0.0–1.0)

## 2011-09-15 LAB — POCT I-STAT, CHEM 8
Calcium, Ion: 1.06 mmol/L — ABNORMAL LOW (ref 1.12–1.32)
Creatinine, Ser: 1.5 mg/dL — ABNORMAL HIGH (ref 0.50–1.35)
Glucose, Bld: 108 mg/dL — ABNORMAL HIGH (ref 70–99)
Hemoglobin: 12.2 g/dL — ABNORMAL LOW (ref 13.0–17.0)
TCO2: 30 mmol/L (ref 0–100)

## 2011-09-15 LAB — DIFFERENTIAL
Basophils Relative: 0 % (ref 0–1)
Eosinophils Absolute: 0.2 10*3/uL (ref 0.0–0.7)
Monocytes Absolute: 0.9 10*3/uL (ref 0.1–1.0)
Monocytes Relative: 9 % (ref 3–12)
Neutrophils Relative %: 77 % (ref 43–77)

## 2011-09-15 LAB — CBC
Hemoglobin: 11.3 g/dL — ABNORMAL LOW (ref 13.0–17.0)
MCH: 31.4 pg (ref 26.0–34.0)
MCHC: 31.9 g/dL (ref 30.0–36.0)

## 2011-09-15 NOTE — Discharge Summary (Signed)
Agree with above 

## 2011-09-15 NOTE — ED Notes (Signed)
Report called to the Nursing Home, unable to contact anyone at [facility]

## 2011-09-15 NOTE — ED Provider Notes (Signed)
History     CSN: [id number] Arrival date & time: 09/14/2011 11:56 PM   First MD Initiated Contact with Patient 09/15/11 0004      Chief Complaint  Patient presents with  . Fall  . Urinary Retention    (Consider location/radiation/quality/duration/timing/severity/associated sxs/prior treatment) Patient is a 70 y.o. male presenting with abdominal pain. The history is provided by the patient.  Abdominal Pain The primary symptoms of the illness include abdominal pain. The primary symptoms of the illness do not include fever, shortness of breath or dysuria. The current episode started 3 to 5 hours ago. The onset of the illness was gradual. The problem has been gradually worsening.  Associated with: Unable to urinate. The patient has not had a change in bowel habit. Risk factors for an acute abdominal problem include a history of abdominal surgery. Additional symptoms associated with the illness include urgency. Symptoms associated with the illness do not include chills, anorexia, diaphoresis, heartburn, constipation, hematuria or back pain.   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 He presents with lower abdominal discomfort and suprapubic region. No radiation of pain. Quality is dull. Severity is moderate to severe.  Patient also had an associated fall he states that he missed his bed and slid in a seated position to the floor.  He did not strike his head or hurt his neck. He denies any pain or injury related to this. He has no weakness or numbness in his extremities.  Past Medical History  Diagnosis Date  . Other congenital hamartoses, not elsewhere classified   . Family history of ischemic heart disease   . Family history of malignant neoplasm of gastrointestinal tract   . Allergic rhinitis, cause unspecified   . Other and unspecified hyperlipidemia   . Unspecified essential hypertension   . Obesity hypoventilation  syndrome     failed cpap/bipap trial  . Hypothyroidism   . GERD (gastroesophageal reflux disease)   . Arthritis     osteoarthritis  . DEMENTIA     Alzheimer's, senile dementia  . Shortness of breath   . Cancer     renal carcinoma  . Sleep apnea     OXYGEN 24 HR/DAY-NO CPAP  . COPD (chronic obstructive pulmonary disease)     DR. Marcelyn Bruins FOLLOWS PT--PT USING OXYGEN 24 HRS A DAY  . COPD (chronic obstructive pulmonary disease)     PULMONARY CLEARANCE FOR LEFT RENAL CRYOABLATION ON CHART FROM DR. CLANCE -07/26/11  . Coronary artery disease     CARDIAC CLEARANCE  OFFICE NOTE FOR LEFT CRYO ABLATION ON CHART FROM [name] -"RECENT STRESS TEST DID NOT SHOW ISCHEMIA" -"PRESUMED CAD DUE TO CORONARY CALCIFICATIONS"     Past Surgical History  Procedure Date  . Eye surgery     bilateral cataracts  . Joint replacement     bilateral knee replacements  . Carpal tunnel release     right  . Thyroidectomy   . Cholecystectomy 2009  . Knee arthroplasty     2005 LEFT   AND 2007 RIGHT--[name]  . Tumor removal 09/14/2011    Family History  Problem Relation Age of Onset  . Cancer Other     colon  . Heart disease Other     CAD, MI and Heart Attack    History  Substance Use Topics  . Smoking status: Never Smoker   . Smokeless tobacco: Never Used  . Alcohol Use: No  Review of Systems  Constitutional: Negative for fever, chills and diaphoresis.  HENT: Negative for neck pain and neck stiffness.   Eyes: Negative for pain.  Respiratory: Negative for shortness of breath.   Cardiovascular: Negative for chest pain, palpitations and leg swelling.  Gastrointestinal: Positive for abdominal pain. Negative for heartburn, constipation and anorexia.  Genitourinary: Positive for urgency. Negative for dysuria, hematuria and penile swelling.  Musculoskeletal: Negative for back pain.  Skin: Negative for rash.  Neurological: Negative for headaches.  All other systems reviewed and are  negative.    Allergies  Review of patient's allergies indicates no known allergies.  Home Medications   Current Outpatient Rx  Name Route Sig Dispense Refill  . ACETAMINOPHEN 325 MG PO TABS Oral Take 650 mg by mouth every 6 (six) hours as needed. pain    . ALPRAZOLAM 0.5 MG PO TABS Oral Take 0.5 mg by mouth 2 (two) times daily.     Marland Kitchen ALUMINUM & MAGNESIUM HYDROXIDE 225-200 MG/5ML PO SUSP Oral Take 30 mLs by mouth every 6 (six) hours as needed. Heart burn and upset stomach    . ASPIRIN 325 MG PO TABS Oral Take 325 mg by mouth at bedtime.     Marland Kitchen BISACODYL 5 MG PO TBEC Oral Take 10 mg by mouth daily as needed. constipation    . CALCITRIOL 0.5 MCG PO CAPS Oral Take 0.5 mcg by mouth 2 (two) times daily.     Marland Kitchen CALCIUM-VITAMIN D 500-200 MG-UNIT PO TABS Oral Take 1 tablet by mouth 2 (two) times daily with a meal.     . CLOPIDOGREL BISULFATE 75 MG PO TABS Oral Take 75 mg by mouth every morning.     . CYCLOSPORINE 0.05 % OP EMUL Both Eyes Place 1 drop into both eyes 2 (two) times daily.     Marland Kitchen FLUTICASONE PROPIONATE 50 MCG/ACT NA SUSP Nasal Place 2 sprays into the nose daily as needed. congestion    . IPRATROPIUM BROMIDE 0.02 % IN SOLN Nebulization Take 500 mcg by nebulization every 6 (six) hours as needed. Shortness of breath    DOSE ON NURSING HOME MED RECORD IS 0.2MG /ML EVERY 6 HOURS AS NEEDED    . LEVOTHYROXINE SODIUM 150 MCG PO TABS Oral Take 150 mcg by mouth every morning.     Marland Kitchen LORAZEPAM 0.5 MG PO TABS Oral Take 0.5 mg by mouth 3 (three) times daily as needed. agitation    . LOTEPREDNOL ETABONATE 0.5 % OP SUSP Both Eyes Place 1 drop into both eyes every morning.     Marland Kitchen MELATONIN SL Sublingual Place 2.5 mg under the tongue at bedtime.     Marland Kitchen METOPROLOL TARTRATE 25 MG PO TABS Oral Take 12.5 mg by mouth 2 (two) times daily.     Marland Kitchen PANTOPRAZOLE SODIUM 40 MG PO TBEC Oral Take 40 mg by mouth daily.     Marland Kitchen POLYETHYLENE GLYCOL 3350 PO PACK Oral Take 17 g by mouth daily as needed. constipation    .  PROMETHAZINE HCL 12.5 MG PO TABS Oral Take 12.5 mg by mouth every 8 (eight) hours as needed. nausea    . SIMETHICONE 80 MG PO CHEW Oral Chew 80 mg by mouth 4 (four) times daily. 4 times daily     . SUCRALFATE 1 G PO TABS Oral Take 1 g by mouth 4 (four) times daily.     Marland Kitchen TAMSULOSIN HCL 0.4 MG PO CAPS Oral Take 0.4 mg by mouth daily.     . TRAMADOL HCL  50 MG PO TABS Oral Take 50 mg by mouth every 8 (eight) hours as needed. Headache/pain     . ZOLPIDEM TARTRATE 10 MG PO TABS Oral Take 10 mg by mouth at bedtime.       BP 94/67  Pulse 78  Temp(Src) 98.7 F (37.1 C) (Oral)  SpO2 99%  Physical Exam  Constitutional: He is oriented to person, place, and time. He appears well-developed and well-nourished.  HENT:  Head: Normocephalic and atraumatic.  Eyes: Conjunctivae and EOM are normal. Pupils are equal, round, and reactive to light.  Neck: Trachea normal. Neck supple. No thyromegaly present.       No midline cervical tenderness or deformity. No thoracic or lumbar tenderness  Cardiovascular: Normal rate, regular rhythm, S1 normal, S2 normal and normal pulses.     No systolic murmur is present   No diastolic murmur is present  Pulses:      Radial pulses are 2+ on the right side, and 2+ on the left side.  Pulmonary/Chest: Effort normal and breath sounds normal. He has no wheezes. He has no rhonchi. He has no rales. He exhibits no tenderness.  Abdominal: Soft. Normal appearance and bowel sounds are normal. There is no CVA tenderness and negative Murphy's sign.       Tender over suprapubic area. Abdomen otherwise soft and nontender no peritonitis.  Musculoskeletal:       BLE:s Calves nontender, no cords or erythema, negative Homans sign  Neurological: He is alert and oriented to person, place, and time. He has normal strength. No cranial nerve deficit or sensory deficit. GCS eye subscore is 4. GCS verbal subscore is 5. GCS motor subscore is 6.  Skin: Skin is warm and dry. No rash noted. He is  not diaphoretic.  Psychiatric: His speech is normal.       Cooperative and appropriate    ED Course  Procedures (including critical care time)  Results for orders placed during the hospital encounter of 09/14/11  CBC      Component Value Range   WBC 10.3  4.0 - 10.5 (K/uL)   RBC 3.60 (*) 4.22 - 5.81 (MIL/uL)   Hemoglobin 11.3 (*) 13.0 - 17.0 (g/dL)   HCT 16.1 (*) 09.6 - 52.0 (%)   MCV 98.3  78.0 - 100.0 (fL)   MCH 31.4  26.0 - 34.0 (pg)   MCHC 31.9  30.0 - 36.0 (g/dL)   RDW 04.5  40.9 - 81.1 (%)   Platelets 183  150 - 400 (K/uL)  DIFFERENTIAL      Component Value Range   Neutrophils Relative 77  43 - 77 (%)   Neutro Abs 8.0 (*) 1.7 - 7.7 (K/uL)   Lymphocytes Relative 12  12 - 46 (%)   Lymphs Abs 1.3  0.7 - 4.0 (K/uL)   Monocytes Relative 9  3 - 12 (%)   Monocytes Absolute 0.9  0.1 - 1.0 (K/uL)   Eosinophils Relative 2  0 - 5 (%)   Eosinophils Absolute 0.2  0.0 - 0.7 (K/uL)   Basophils Relative 0  0 - 1 (%)   Basophils Absolute 0.0  0.0 - 0.1 (K/uL)  URINALYSIS, ROUTINE W REFLEX MICROSCOPIC      Component Value Range   Color, Urine YELLOW  YELLOW    Appearance CLEAR  CLEAR    Specific Gravity, Urine 1.014  1.005 - 1.030    pH 7.5  5.0 - 8.0    Glucose, UA NEGATIVE  NEGATIVE (mg/dL)  Hgb urine dipstick LARGE (*) NEGATIVE    Bilirubin Urine NEGATIVE  NEGATIVE    Ketones, ur NEGATIVE  NEGATIVE (mg/dL)   Protein, ur 30 (*) NEGATIVE (mg/dL)   Urobilinogen, UA 0.2  0.0 - 1.0 (mg/dL)   Nitrite NEGATIVE  NEGATIVE    Leukocytes, UA NEGATIVE  NEGATIVE   POCT I-STAT, CHEM 8      Component Value Range   Sodium 137  135 - 145 (mEq/L)   Potassium 4.5  3.5 - 5.1 (mEq/L)   Chloride 98  96 - 112 (mEq/L)   BUN 23  6 - 23 (mg/dL)   Creatinine, Ser 7.82 (*) 0.50 - 1.35 (mg/dL)   Glucose, Bld 956 (*) 70 - 99 (mg/dL)   Calcium, Ion 2.13 (*) 1.12 - 1.32 (mmol/L)   TCO2 30  0 - 100 (mmol/L)   Hemoglobin 12.2 (*) 13.0 - 17.0 (g/dL)   HCT 08.6 (*) 57.8 - 52.0 (%)  URINE  MICROSCOPIC-ADD ON      Component Value Range   Squamous Epithelial / LPF RARE  RARE    WBC, UA 0-2  <3 (WBC/hpf)   RBC / HPF 7-10  <3 (RBC/hpf)   Bacteria, UA FEW (*) RARE    Urine-Other MUCOUS PRESENT       About 1000cc of dark urine output after foley placed. Urinalysis and labs sent as above and reviewed.  MDM   Urinary retention with significant urine output after Foley placement as above. Patient feeling much better and requesting to be discharged home. Urology referral for cath removal and close followup. Urine culture sent        Sunnie Nielsen, MD 09/15/11 [phone]

## 2011-09-15 NOTE — ED Notes (Signed)
Patient is resting more comfortably at this time. Foley intact and draining well. Patient with Decreased o2 sats - 2 liters placed as per home O2 requirements

## 2011-09-15 NOTE — ED Notes (Signed)
Patient is resting comfortably.Waiting for the PTAR.

## 2011-09-16 ENCOUNTER — Other Ambulatory Visit: Payer: Self-pay | Admitting: Interventional Radiology

## 2011-09-16 ENCOUNTER — Other Ambulatory Visit (HOSPITAL_COMMUNITY): Payer: Self-pay | Admitting: Interventional Radiology

## 2011-09-16 DIAGNOSIS — N2889 Other specified disorders of kidney and ureter: Secondary | ICD-10-CM

## 2011-09-17 LAB — URINE CULTURE
Colony Count: NO GROWTH
Culture: NO GROWTH

## 2011-10-02 ENCOUNTER — Telehealth: Payer: Self-pay | Admitting: Emergency Medicine

## 2011-10-02 ENCOUNTER — Other Ambulatory Visit: Payer: Self-pay | Admitting: Emergency Medicine

## 2011-10-02 DIAGNOSIS — N2889 Other specified disorders of kidney and ureter: Secondary | ICD-10-CM

## 2011-10-02 NOTE — Telephone Encounter (Signed)
LM FOR [facility] TO GO OVER APPTS W/ PT AND I FAXED ORDER FOR LABS TO BE DRAWN THERE.    3:24PM CONFIRMED CT AND VISIT APPTS. W/ [facility] AND TO HAVE LABS DRAWN [facility].

## 2011-10-29 ENCOUNTER — Inpatient Hospital Stay (HOSPITAL_COMMUNITY)
Admission: RE | Admit: 2011-10-29 | Discharge: 2011-10-29 | Payer: Medicare Other | Source: Ambulatory Visit | Attending: Interventional Radiology | Admitting: Interventional Radiology

## 2011-10-29 ENCOUNTER — Other Ambulatory Visit: Payer: Medicare Other

## 2011-11-01 ENCOUNTER — Inpatient Hospital Stay (HOSPITAL_COMMUNITY)
Admission: RE | Admit: 2011-11-01 | Discharge: 2011-11-01 | Payer: Medicare Other | Source: Ambulatory Visit | Attending: Interventional Radiology | Admitting: Interventional Radiology

## 2011-11-08 ENCOUNTER — Ambulatory Visit (HOSPITAL_COMMUNITY)
Admission: RE | Admit: 2011-11-08 | Discharge: 2011-11-08 | Disposition: A | Discharge status: Home / Self Care | Payer: Medicare Other | Source: Ambulatory Visit | Attending: Interventional Radiology | Admitting: Interventional Radiology

## 2011-11-08 DIAGNOSIS — Z9089 Acquired absence of other organs: Secondary | ICD-10-CM | POA: Insufficient documentation

## 2011-11-08 DIAGNOSIS — N289 Disorder of kidney and ureter, unspecified: Secondary | ICD-10-CM | POA: Insufficient documentation

## 2011-11-08 DIAGNOSIS — N2889 Other specified disorders of kidney and ureter: Secondary | ICD-10-CM

## 2011-11-08 MED ORDER — IOHEXOL 300 MG/ML  SOLN
100.0000 mL | Freq: Once | INTRAMUSCULAR | Status: AC | PRN
  Administered 2011-11-08: 100 mL via INTRAVENOUS

## 2011-11-15 ENCOUNTER — Encounter: Payer: Self-pay | Admitting: Pulmonary Disease

## 2011-11-15 ENCOUNTER — Ambulatory Visit (INDEPENDENT_AMBULATORY_CARE_PROVIDER_SITE_OTHER): Payer: Medicare Other | Admitting: Pulmonary Disease

## 2011-11-15 VITALS — BP 90/62 | HR 65 | Temp 97.5°F | Ht 76.0 in | Wt 287.2 lb

## 2011-11-15 DIAGNOSIS — G4733 Obstructive sleep apnea (adult) (pediatric): Secondary | ICD-10-CM

## 2011-11-15 NOTE — Progress Notes (Signed)
  Subjective:    Patient ID: [name] [name], male    DOB: 1940/12/10, 71 y.o.   MRN: [id number]  HPI Patient is a 71 year old male who I have been asked to see for management of obstructive sleep apnea.  The patient was diagnosed with moderate sleep apnea in 2009, with an AHI of 28 events per hour.  He was apparently tried on CPAP, but could not tolerate.  He tells me that he is a side sleeper, and this would dislodge his mask.  He had great difficulty finding a mask that would fit, primarily due to be absence of teeth.  The patient currently is having issues with sleep onset insomnia, and is taking sleep aides that help somewhat.  He admits to frequent awakenings at night, and he has nonrestorative sleep at least 50% of the time when he arises.  The patient feels his alertness during the day is adequate for him most of the time.  He currently is wearing nocturnal oxygen.  Sleep Questionnaire: What time do you typically go to bed?( Between what hours) 8:30 to 9:30 pm How long does it take you to fall asleep? quickly How many times during the night do you wake up? 3 What time do you get out of bed to start your day? 0930 Do you drive or operate heavy machinery in your occupation? No How much has your weight changed (up or down) over the past two years? (In pounds) Have you ever had a sleep study before? Yes If yes, location of study? Dr. Oliva [facility]ust[facility] office If yes, date of study? approx 5 years ago or less Do you currently use CPAP? No If so, what pressure? Do you wear oxygen at any time? Yes O2 Flow Rate (L/min)     Review of Systems  Constitutional: Negative for fever and unexpected weight change.  HENT: Negative for ear pain, nosebleeds, congestion, sore throat, rhinorrhea, sneezing, trouble swallowing, dental problem, postnasal drip and sinus pressure.   Eyes: Negative for redness and itching.  Respiratory: Negative for cough, chest tightness, shortness of breath and wheezing.   Cardiovascular:  Negative for palpitations and leg swelling.  Gastrointestinal: Negative for nausea and vomiting.  Genitourinary: Negative for dysuria.  Musculoskeletal: Negative for joint swelling.  Skin: Negative for rash.  Neurological: Negative for headaches.  Hematological: Does not bruise/bleed easily.  Psychiatric/Behavioral: Negative for dysphoric mood. The patient is not nervous/anxious.        Objective:   Physical Exam Constitutional:  Overweight male, no acute distress  HENT:  Nares patent without discharge, but narrowed.  Oropharynx without exudate, palate and uvula are elongated.   Eyes:  Perrla, eomi, no scleral icterus  Neck:  No JVD, no TMG  Cardiovascular:  Normal rate, regular rhythm, no rubs or gallops.  No murmurs        Intact distal pulses but decreased  Pulmonary :  decreased breath sounds, no stridor or respiratory distress   No rhonchi, or wheezing.  Crackles both bases  Abdominal:  Soft, nondistended, bowel sounds present.  No tenderness noted.   Musculoskeletal: + lower extremity edema noted.  Lymph Nodes:  No cervical lymphadenopathy noted  Skin:  No cyanosis noted  Neurologic:  Alert, appropriate, moves all 4 extremities without obvious deficit.         Assessment & Plan:

## 2011-11-15 NOTE — Patient Instructions (Signed)
Will try cpap again, but this time with nasal pillows and chin strap Please call if having issues with tolerance, and followup with me in 6 weeks.

## 2011-11-15 NOTE — Assessment & Plan Note (Signed)
The patient has a history of moderate obstructive sleep apnea by his sleep study in 2009, but was intolerant of CPAP because of issues with mask fit.  There have been a lot of new masks the last few years, and I wonder if nasal pillows would be a better option for him.  The patient's main sleep complaints today are that of insomnia, primarily related to sleep onset.  I suspect that CPAP will not improve this, and may even make it worse.  The patient I have had a long discussion about trying CPAP again versus staying on nocturnal oxygen.  He is willing to give this a try, but is not overly optimistic.  I also have doubts that he will be able to stick with this on a regular basis.  I am willing to give him another try, and will use nasal pillows this go around.  If he is not able to tolerate, would just go back to his nocturnal oxygen.

## 2011-11-20 ENCOUNTER — Encounter (HOSPITAL_COMMUNITY): Payer: Self-pay | Admitting: Emergency Medicine

## 2011-11-20 ENCOUNTER — Emergency Department (HOSPITAL_COMMUNITY)
Admission: EM | Admit: 2011-11-20 | Discharge: 2011-11-20 | Disposition: A | Discharge status: Home / Self Care | Payer: Medicare Other | Attending: Emergency Medicine | Admitting: Emergency Medicine

## 2011-11-20 ENCOUNTER — Emergency Department (HOSPITAL_COMMUNITY): Payer: Medicare Other

## 2011-11-20 DIAGNOSIS — N200 Calculus of kidney: Secondary | ICD-10-CM | POA: Insufficient documentation

## 2011-11-20 DIAGNOSIS — F0280 Dementia in other diseases classified elsewhere without behavioral disturbance: Secondary | ICD-10-CM | POA: Insufficient documentation

## 2011-11-20 DIAGNOSIS — Z85528 Personal history of other malignant neoplasm of kidney: Secondary | ICD-10-CM | POA: Insufficient documentation

## 2011-11-20 DIAGNOSIS — K5900 Constipation, unspecified: Secondary | ICD-10-CM | POA: Insufficient documentation

## 2011-11-20 DIAGNOSIS — R0682 Tachypnea, not elsewhere classified: Secondary | ICD-10-CM | POA: Insufficient documentation

## 2011-11-20 DIAGNOSIS — G309 Alzheimer's disease, unspecified: Secondary | ICD-10-CM | POA: Insufficient documentation

## 2011-11-20 DIAGNOSIS — I2510 Atherosclerotic heart disease of native coronary artery without angina pectoris: Secondary | ICD-10-CM | POA: Insufficient documentation

## 2011-11-20 DIAGNOSIS — R109 Unspecified abdominal pain: Secondary | ICD-10-CM

## 2011-11-20 DIAGNOSIS — Z79899 Other long term (current) drug therapy: Secondary | ICD-10-CM | POA: Insufficient documentation

## 2011-11-20 DIAGNOSIS — R10819 Abdominal tenderness, unspecified site: Secondary | ICD-10-CM | POA: Insufficient documentation

## 2011-11-20 LAB — LIPASE, BLOOD: Lipase: 29 U/L (ref 11–59)

## 2011-11-20 LAB — URINALYSIS, ROUTINE W REFLEX MICROSCOPIC
Bilirubin Urine: NEGATIVE
Ketones, ur: NEGATIVE mg/dL
Nitrite: NEGATIVE
Protein, ur: NEGATIVE mg/dL
Urobilinogen, UA: 0.2 mg/dL (ref 0.0–1.0)
pH: 8 (ref 5.0–8.0)

## 2011-11-20 LAB — COMPREHENSIVE METABOLIC PANEL
Alkaline Phosphatase: 81 U/L (ref 39–117)
BUN: 23 mg/dL (ref 6–23)
Chloride: 99 mEq/L (ref 96–112)
GFR calc Af Amer: 57 mL/min — ABNORMAL LOW (ref >90)
GFR calc non Af Amer: 50 mL/min — ABNORMAL LOW (ref >90)
Glucose, Bld: 97 mg/dL (ref 70–99)
Potassium: 4.3 mEq/L (ref 3.5–5.1)
Total Bilirubin: 0.3 mg/dL (ref 0.3–1.2)

## 2011-11-20 LAB — CBC
HCT: 36.8 % — ABNORMAL LOW (ref 39.0–52.0)
Hemoglobin: 12 g/dL — ABNORMAL LOW (ref 13.0–17.0)
MCH: 32 pg (ref 26.0–34.0)
RBC: 3.75 MIL/uL — ABNORMAL LOW (ref 4.22–5.81)

## 2011-11-20 LAB — DIFFERENTIAL
Lymphs Abs: 0.9 10*3/uL (ref 0.7–4.0)
Monocytes Absolute: 0.8 10*3/uL (ref 0.1–1.0)
Monocytes Relative: 7 % (ref 3–12)
Neutro Abs: 9.3 10*3/uL — ABNORMAL HIGH (ref 1.7–7.7)
Neutrophils Relative %: 84 % — ABNORMAL HIGH (ref 43–77)

## 2011-11-20 MED ORDER — SODIUM CHLORIDE 0.9 % IV SOLN
Freq: Once | INTRAVENOUS | Status: AC
  Administered 2011-11-20: 17:00:00 via INTRAVENOUS

## 2011-11-20 MED ORDER — MORPHINE SULFATE 4 MG/ML IJ SOLN
4.0000 mg | Freq: Once | INTRAMUSCULAR | Status: AC
  Administered 2011-11-20: 4 mg via INTRAVENOUS
  Filled 2011-11-20: qty 1

## 2011-11-20 MED ORDER — ONDANSETRON HCL 4 MG/2ML IJ SOLN
4.0000 mg | Freq: Once | INTRAMUSCULAR | Status: AC
  Administered 2011-11-20: 4 mg via INTRAVENOUS
  Filled 2011-11-20: qty 2

## 2011-11-20 MED ORDER — MAGNESIUM CITRATE PO SOLN
1.0000 | ORAL | Status: DC
  Filled 2011-11-20: qty 296

## 2011-11-20 MED ORDER — IOHEXOL 300 MG/ML  SOLN
100.0000 mL | Freq: Once | INTRAMUSCULAR | Status: AC | PRN
  Administered 2011-11-20: 100 mL via INTRAVENOUS

## 2011-11-20 NOTE — ED Notes (Signed)
Urine collected and sent down to lab for keeping. No order for testing at this time. RN notified 

## 2011-11-20 NOTE — ED Notes (Signed)
Patient transported to CT 

## 2011-11-20 NOTE — ED Provider Notes (Signed)
History     CSN: [id number]  Arrival date & time 11/20/11  1505   First MD Initiated Contact with Patient 11/20/11 1601      Chief Complaint  Patient presents with  . Abdominal Pain    Pt c/o RLQ abd pain and epigastric pain since yesterday pm.    (Consider location/radiation/quality/duration/timing/severity/associated sxs/prior treatment) Patient is a 71 y.o. male presenting with abdominal pain. The history is provided by the patient.  Abdominal Pain The primary symptoms of the illness include abdominal pain.  Patient is a somewhat vague and a poor historian, but he is complaining of abdominal pain since last night. The pain is in the epigastric area no longer right side of the abdomen. He describes it as a crampy pain at 5/10. Pain is moderate in severity. Nothing makes it any better. It is somewhat worse after a bowel movement. He denies nausea or vomiting, but has had some diarrhea. He denies fever, chills, sweats. He has not done anything to try and help the pain.  Past Medical History  Diagnosis Date  . Other congenital hamartoses, not elsewhere classified   . Family history of ischemic heart disease   . Family history of malignant neoplasm of gastrointestinal tract   . Allergic rhinitis, cause unspecified   . Other and unspecified hyperlipidemia   . Uncontrolled hypertension as indication for native nephrectomy   . Obesity hypoventilation syndrome     failed cpap/bipap trial  . Hypothyroidism   . GERD (gastroesophageal reflux disease)   . Arthritis     osteoarthritis  . DEMENTIA     Alzheimer's, senile dementia  . Shortness of breath   . Cancer     renal carcinoma  . Sleep apnea     OXYGEN 24 HR/DAY-NO CPAP  . COPD (chronic obstructive pulmonary disease)     DR. Marcelyn Bruins FOLLOWS PT--PT USING OXYGEN 24 HRS A DAY  . COPD (chronic obstructive pulmonary disease)     PULMONARY CLEARANCE FOR LEFT RENAL CRYOABLATION ON CHART FROM DR. CLANCE -07/26/11  . Coronary  artery disease     CARDIAC CLEARANCE  OFFICE NOTE FOR LEFT CRYO ABLATION ON CHART FROM [name] -"RECENT STRESS TEST DID NOT SHOW ISCHEMIA" -"PRESUMED CAD DUE TO CORONARY CALCIFICATIONS"     Past Surgical History  Procedure Date  . Eye surgery     bilateral cataracts  . Joint replacement     bilateral knee replacements  . Carpal tunnel release     right  . Thyroidectomy   . Cholecystectomy 2009  . Knee arthroplasty     2005 LEFT   AND 2007 RIGHT--[name]  . Tumor removal 09/14/2011    Family History  Problem Relation Age of Onset  . Cancer Other     colon  . Heart disease Other     CAD, MI and Heart Attack    History  Substance Use Topics  . Smoking status: Never Smoker   . Smokeless tobacco: Never Used  . Alcohol Use: No      Review of Systems  Gastrointestinal: Positive for abdominal pain.  All other systems reviewed and are negative.    Allergies  Review of patient's allergies indicates no known allergies.  Home Medications   Current Outpatient Rx  Name Route Sig Dispense Refill  . ACETAMINOPHEN 325 MG PO TABS Oral Take 650 mg by mouth every 6 (six) hours as needed. pain    . ALPRAZOLAM 0.5 MG PO TABS Oral Take 0.5 mg by  mouth 2 (two) times daily.     Marland Kitchen ALUMINUM & MAGNESIUM HYDROXIDE 225-200 MG/5ML PO SUSP Oral Take 30 mLs by mouth every 6 (six) hours as needed. Heart burn and upset stomach    . ASPIRIN 325 MG PO TABS Oral Take 325 mg by mouth at bedtime.     Marland Kitchen BISACODYL 5 MG PO TBEC Oral Take 10 mg by mouth daily as needed. constipation    . CALCITRIOL 0.5 MCG PO CAPS Oral Take 0.5 mcg by mouth 2 (two) times daily.     . OYSTER SHELL/D 500-200 MG-UNIT PO TABS Oral Take 1 tablet by mouth 2 (two) times daily.    Marland Kitchen CALCIUM-VITAMIN D 500-200 MG-UNIT PO TABS Oral Take 1 tablet by mouth 2 (two) times daily with a meal.     . CLOPIDOGREL BISULFATE 75 MG PO TABS Oral Take 75 mg by mouth every morning.     . CYCLOSPORINE 0.05 % OP EMUL Both Eyes Place 1  drop into both eyes 2 (two) times daily.     Marland Kitchen FLUTICASONE PROPIONATE 50 MCG/ACT NA SUSP Nasal Place 2 sprays into the nose 2 (two) times daily. congestion    . IPRATROPIUM BROMIDE 0.02 % IN SOLN Nebulization Take 500 mcg by nebulization every 6 (six) hours as needed. Shortness of breath    DOSE ON NURSING HOME MED RECORD IS 0.2MG /ML EVERY 6 HOURS AS NEEDED    . LEVOTHYROXINE SODIUM PO Oral Take 225 mcg by mouth daily.    Marland Kitchen LORAZEPAM 0.5 MG PO TABS Oral Take 0.5 mg by mouth 2 (two) times daily. agitation    . LOTEPREDNOL ETABONATE 0.5 % OP SUSP Both Eyes Place 1 drop into both eyes every morning.     Marland Kitchen MELATONIN SL Sublingual Place 2.5 mg under the tongue at bedtime.     Marland Kitchen METOPROLOL TARTRATE 25 MG PO TABS Oral Take 12.5 mg by mouth 2 (two) times daily.     Marland Kitchen PANTOPRAZOLE SODIUM 40 MG PO TBEC Oral Take 40 mg by mouth daily.     Marland Kitchen POLYETHYLENE GLYCOL 3350 PO PACK Oral Take 17 g by mouth daily as needed. constipation    . PREGABALIN 75 MG PO CAPS Oral Take 75 mg by mouth 2 (two) times daily.    . SENNA 8.6 MG PO TABS Oral Take 2 tablets by mouth 2 (two) times daily.    Marland Kitchen SIMETHICONE 80 MG PO CHEW Oral Chew 80 mg by mouth 4 (four) times daily. 4 times daily     . SUCRALFATE 1 G PO TABS Oral Take 1 g by mouth 4 (four) times daily.     Marland Kitchen TAMSULOSIN HCL 0.4 MG PO CAPS Oral Take 0.4 mg by mouth daily.     Marland Kitchen ZOLPIDEM TARTRATE 10 MG PO TABS Oral Take 10 mg by mouth at bedtime.       BP 99/62  Pulse 85  Temp(Src) 99.5 F (37.5 C) (Oral)  Resp 22  Ht 6\' 3"  (1.905 m)  Wt 275 lb (124.739 kg)  BMI 34.37 kg/m2  SpO2 100%  Physical Exam  Nursing note and vitals reviewed.  71 year old male who is resting comfortably and in no acute distress. Vital signs are significant for mild tachypnea with respiratory rate of 22. Oxygen saturation is 100% which is normal. Pupils are equal and reactive anicteric movements are full. Oropharynx is clear. Head is normocephalic and atraumatic. Neck is nontender and  supple without adenopathy or JVD. Lungs are clear without rales, wheezes,  or rhonchi. Heart has regular rate and rhythm without murmur. Abdomen is soft, flat, with moderate tenderness across the upper abdomen and along the right side of the abdomen. There is no rebound or guarding. Peristalsis is diminished. Extremities have 2+ edema, no cyanosis. Neurologic: Cranial nerves are intact, there no focal motor or sensory deficits.  ED Course  Procedures (including critical care time)  Results for orders placed during the hospital encounter of 11/20/11  CBC      Component Value Range   WBC 11.1 (*) 4.0 - 10.5 (K/uL)   RBC 3.75 (*) 4.22 - 5.81 (MIL/uL)   Hemoglobin 12.0 (*) 13.0 - 17.0 (g/dL)   HCT 16.1 (*) 09.6 - 52.0 (%)   MCV 98.1  78.0 - 100.0 (fL)   MCH 32.0  26.0 - 34.0 (pg)   MCHC 32.6  30.0 - 36.0 (g/dL)   RDW 04.5  40.9 - 81.1 (%)   Platelets 170  150 - 400 (K/uL)  DIFFERENTIAL      Component Value Range   Neutrophils Relative 84 (*) 43 - 77 (%)   Neutro Abs 9.3 (*) 1.7 - 7.7 (K/uL)   Lymphocytes Relative 8 (*) 12 - 46 (%)   Lymphs Abs 0.9  0.7 - 4.0 (K/uL)   Monocytes Relative 7  3 - 12 (%)   Monocytes Absolute 0.8  0.1 - 1.0 (K/uL)   Eosinophils Relative 1  0 - 5 (%)   Eosinophils Absolute 0.1  0.0 - 0.7 (K/uL)   Basophils Relative 0  0 - 1 (%)   Basophils Absolute 0.0  0.0 - 0.1 (K/uL)  COMPREHENSIVE METABOLIC PANEL      Component Value Range   Sodium 141  135 - 145 (mEq/L)   Potassium 4.3  3.5 - 5.1 (mEq/L)   Chloride 99  96 - 112 (mEq/L)   CO2 35 (*) 19 - 32 (mEq/L)   Glucose, Bld 97  70 - 99 (mg/dL)   BUN 23  6 - 23 (mg/dL)   Creatinine, Ser 9.14 (*) 0.50 - 1.35 (mg/dL)   Calcium 9.2  8.4 - 78.2 (mg/dL)   Total Protein 6.6  6.0 - 8.3 (g/dL)   Albumin 3.3 (*) 3.5 - 5.2 (g/dL)   AST 20  0 - 37 (U/L)   ALT 11  0 - 53 (U/L)   Alkaline Phosphatase 81  39 - 117 (U/L)   Total Bilirubin 0.3  0.3 - 1.2 (mg/dL)   GFR calc non Af Amer 50 (*) >90 (mL/min)   GFR calc Af Amer  57 (*) >90 (mL/min)  LIPASE, BLOOD      Component Value Range   Lipase 29  11 - 59 (U/L)  URINALYSIS, ROUTINE W REFLEX MICROSCOPIC      Component Value Range   Color, Urine YELLOW  YELLOW    APPearance CLOUDY (*) CLEAR    Specific Gravity, Urine 1.013  1.005 - 1.030    pH 8.0  5.0 - 8.0    Glucose, UA NEGATIVE  NEGATIVE (mg/dL)   Hgb urine dipstick NEGATIVE  NEGATIVE    Bilirubin Urine NEGATIVE  NEGATIVE    Ketones, ur NEGATIVE  NEGATIVE (mg/dL)   Protein, ur NEGATIVE  NEGATIVE (mg/dL)   Urobilinogen, UA 0.2  0.0 - 1.0 (mg/dL)   Nitrite NEGATIVE  NEGATIVE    Leukocytes, UA NEGATIVE  NEGATIVE    Ct Abdomen Pelvis W Contrast  11/20/2011  *RADIOLOGY REPORT*  Clinical Data: Right lower quadrant abdominal pain.  Epigastric pain.  CT ABDOMEN AND PELVIS WITH CONTRAST  Technique:  Multidetector CT imaging of the abdomen and pelvis was performed following the standard protocol during bolus administration of intravenous contrast.  Contrast: OMNIPAQUE IOHEXOL 300 MG/ML IV SOLN  Comparison: 11/07/2028.  Findings: Dense atelectasis is present over the elevated left hemidiaphragm, similar to the most recent prior examination.  The liver appears within normal limits.  Mild intrahepatic biliary ductal dilation is present, after cholecystectomy.  No calcified gallstones.  Fatty atrophy of the pancreas as expected for age. The right kidney appears within normal limits.  The left kidney demonstrates nonobstructing collecting system calculi.  Left inferior pole renal mass shows expected changes after prior ablation.  Adrenal glands appear normal.  The stomach and duodenum appear within normal limits.  Patulous gastroesophageal junction incidentally noted.  Prominent stool burden is present.  Normal appendix.  Urinary bladder appears normal.  Prostatic calcifications are present.  There is no free air.  Small periumbilical hernia is present which appears similar to the prior exam.  Delayed renal images show  good excretion of contrast bilaterally. Lumbar spondylosis and scoliosis.  No aggressive osseous lesions identified.  IMPRESSION: 1.  Expected changes of left inferior pole renal mass of dilation. No complicating features. 2.  Nonobstructing left renal collecting system calculi. 3.  Dense atelectasis or scarring in the left lower lobe over the elevated left hemidiaphragm appears unchanged compared to prior. 4.  Prominent stool burden.  Normal appendix in the right lower quadrant. 5.  Tiny periumbilical hernia.  Original Report Authenticated By: Andreas Newport, M.D.   Ct Abd Wo & W Cm  11/08/2011  *RADIOLOGY REPORT*  Clinical Data: 1 month post left cryoablation  CT ABDOMEN WITHOUT AND WITH CONTRAST  Technique:  Multidetector CT imaging of the abdomen was performed following the standard protocol before and during bolus administration of intravenous contrast.  Contrast: OMNIPAQUE IOHEXOL 300 MG/ML IV SOLN  Comparison: 05/16/2011  Findings:  No pleural or pericardial effusions identified.  The lung bases appear clear.  Prior cholecystectomy.  The spleen appears normal.  Both of the adrenal glands appear normal.  The pancreas is normal.  There is no biliary dilatation.  Normal appearance of the right kidney.  The cryo ablation site arising from the inferior pole of the left kidney measures 4.1 x 4.2 x 4.6 cm. Previously, noted calcifications are identified within the central portion of the ablation site.   The precontrast Hounsfield units within the ablation site equal 1.06.The postcontrast Hounsfield units equal 6.0.  The left renal artery and vein appear patent and perfused.  On the delayed images there are symmetric urograms without evidence for urinoma.  There are no enlarged upper abdominal lymph nodes.  Review of the visualized osseous structures is significant for scoliosis and mild multilevel degenerative disc disease.  IMPRESSION:  1.  Expected changes following cryoablation of the left inferior pole  renal lesion.  No complicating features are identified. 2.  No specific features noted to suggest residual or recurrent tumor.  Original Report Authenticated By: Rosealee Albee, M.D.    Workup is negative except for evidence of constipation. I do suspect that this is the cause of his abdominal pain. He will be given a dose of magnesium citrate and returned to his nursing home. Tiny make note of report of small bowel obstruction that is in the triage note. There is absolutely no evidence of small bowel obstruction on his CT scan.  1. Abdominal pain, bilateral lower quadrant  2. Constipation       MDM  Abdominal pain etiology unclear. Workup will be obtained including labs and CT scan.        Dione Booze, MD 11/20/11 [phone]

## 2011-11-20 NOTE — ED Notes (Signed)
ZOX:[id number]<AV> Expected date:11/20/11<BR> Expected time: 4:00 PM<BR> Means of arrival:Ambulance<BR> Comments:<BR> Hold for Everette Sw[facility]-transported from [facility]

## 2011-11-20 NOTE — ED Notes (Signed)
Pt c/o RLQ abd pain and epigastric pain since yesterday pm.  Pt also c/o productive cough with clear phlegm and mild SOB. Pt states this is how he felt last time he had pneumonia (4-5 months ago).  Pt denies N/V or changes in bowel patterns.  Pt on chronic O2.

## 2011-11-21 ENCOUNTER — Ambulatory Visit: Payer: Medicare Other | Admitting: Endocrinology

## 2011-11-26 ENCOUNTER — Ambulatory Visit
Admission: RE | Admit: 2011-11-26 | Discharge: 2011-11-26 | Disposition: A | Discharge status: Home / Self Care | Payer: Medicare Other | Source: Ambulatory Visit | Attending: Interventional Radiology | Admitting: Interventional Radiology

## 2011-11-26 DIAGNOSIS — N2889 Other specified disorders of kidney and ureter: Secondary | ICD-10-CM

## 2011-11-26 NOTE — Progress Notes (Signed)
Left voice mail message with Brett Canales [phone]), transportation contact for [name] at [facility], that [name] is done here and ready for pick up in our lobby.  Left [name]/[name]'s phone number prn.  [email or web address]

## 2011-11-28 ENCOUNTER — Other Ambulatory Visit: Payer: Self-pay | Admitting: Interventional Radiology

## 2011-11-28 DIAGNOSIS — N2889 Other specified disorders of kidney and ureter: Secondary | ICD-10-CM

## 2011-11-29 ENCOUNTER — Ambulatory Visit: Payer: Medicare Other | Admitting: Endocrinology

## 2011-12-05 ENCOUNTER — Emergency Department (HOSPITAL_COMMUNITY): Payer: Medicare Other

## 2011-12-05 ENCOUNTER — Other Ambulatory Visit: Payer: Self-pay

## 2011-12-05 ENCOUNTER — Encounter (HOSPITAL_COMMUNITY): Payer: Self-pay | Admitting: *Deleted

## 2011-12-05 ENCOUNTER — Emergency Department (HOSPITAL_COMMUNITY)
Admission: EM | Admit: 2011-12-05 | Discharge: 2011-12-06 | Disposition: A | Discharge status: Home / Self Care | Payer: Medicare Other | Attending: Emergency Medicine | Admitting: Emergency Medicine

## 2011-12-05 DIAGNOSIS — R609 Edema, unspecified: Secondary | ICD-10-CM | POA: Insufficient documentation

## 2011-12-05 DIAGNOSIS — R079 Chest pain, unspecified: Secondary | ICD-10-CM | POA: Insufficient documentation

## 2011-12-05 DIAGNOSIS — I2510 Atherosclerotic heart disease of native coronary artery without angina pectoris: Secondary | ICD-10-CM | POA: Insufficient documentation

## 2011-12-05 DIAGNOSIS — R0602 Shortness of breath: Secondary | ICD-10-CM | POA: Insufficient documentation

## 2011-12-05 DIAGNOSIS — R109 Unspecified abdominal pain: Secondary | ICD-10-CM

## 2011-12-05 DIAGNOSIS — G309 Alzheimer's disease, unspecified: Secondary | ICD-10-CM | POA: Insufficient documentation

## 2011-12-05 DIAGNOSIS — Z85528 Personal history of other malignant neoplasm of kidney: Secondary | ICD-10-CM | POA: Insufficient documentation

## 2011-12-05 DIAGNOSIS — R10816 Epigastric abdominal tenderness: Secondary | ICD-10-CM | POA: Insufficient documentation

## 2011-12-05 DIAGNOSIS — F0280 Dementia in other diseases classified elsewhere without behavioral disturbance: Secondary | ICD-10-CM | POA: Insufficient documentation

## 2011-12-05 LAB — BASIC METABOLIC PANEL
BUN: 18 mg/dL (ref 6–23)
Creatinine, Ser: 1.27 mg/dL (ref 0.50–1.35)
GFR calc Af Amer: 63 mL/min — ABNORMAL LOW (ref >90)
GFR calc non Af Amer: 55 mL/min — ABNORMAL LOW (ref >90)
Potassium: 3.9 mEq/L (ref 3.5–5.1)

## 2011-12-05 LAB — URINALYSIS, ROUTINE W REFLEX MICROSCOPIC
Bilirubin Urine: NEGATIVE
Ketones, ur: NEGATIVE mg/dL
Nitrite: NEGATIVE
Protein, ur: NEGATIVE mg/dL
Specific Gravity, Urine: 1.013 (ref 1.005–1.030)
Urobilinogen, UA: 0.2 mg/dL (ref 0.0–1.0)

## 2011-12-05 LAB — DIFFERENTIAL
Basophils Relative: 0 % (ref 0–1)
Eosinophils Absolute: 0.2 10*3/uL (ref 0.0–0.7)
Monocytes Relative: 7 % (ref 3–12)
Neutrophils Relative %: 68 % (ref 43–77)

## 2011-12-05 LAB — CBC
Hemoglobin: 11.9 g/dL — ABNORMAL LOW (ref 13.0–17.0)
MCH: 31.4 pg (ref 26.0–34.0)
MCHC: 32.3 g/dL (ref 30.0–36.0)

## 2011-12-05 MED ORDER — GI COCKTAIL ~~LOC~~
30.0000 mL | Freq: Once | ORAL | Status: AC
  Administered 2011-12-05: 30 mL via ORAL
  Filled 2011-12-05: qty 30

## 2011-12-05 NOTE — ED Notes (Signed)
Bed:WA10<BR> Expected date:<BR> Expected time:<BR> Means of arrival:<BR> Comments:<BR> ems 

## 2011-12-05 NOTE — ED Notes (Signed)
Reports LLQ pain-denies n/v.

## 2011-12-05 NOTE — Discharge Instructions (Signed)
Abdominal Pain (Nonspecific) Your exam might not show the exact reason you have abdominal pain. Since there are many different causes of abdominal pain, another checkup and more tests may be needed. It is very important to follow up for lasting (persistent) or worsening symptoms. A possible cause of abdominal pain in any person who still has his or her appendix is acute appendicitis. Appendicitis is often hard to diagnose. Normal blood tests, urine tests, ultrasound, and CT scans do not completely rule out early appendicitis or other causes of abdominal pain. Sometimes, only the changes that happen over time will allow appendicitis and other causes of abdominal pain to be determined. Other potential problems that may require surgery may also take time to become more apparent. Because of this, it is important that you follow all of the instructions below. HOME CARE INSTRUCTIONS   Rest as much as possible.   Do not eat solid food until your pain is gone.   While adults or children have pain: A diet of water, weak decaffeinated tea, broth or bouillon, gelatin, oral rehydration solutions (ORS), frozen ice pops, or ice chips may be helpful.   When pain is gone in adults or children: Start a light diet (dry toast, crackers, applesauce, or white rice). Increase the diet slowly as long as it does not bother you. Eat no dairy products (including cheese and eggs) and no spicy, fatty, fried, or high-fiber foods.   Use no alcohol, caffeine, or cigarettes.   Take your regular medicines unless your caregiver told you not to.   Take any prescribed medicine as directed.   Only take over-the-counter or prescription medicines for pain, discomfort, or fever as directed by your caregiver. Do not give aspirin to children.  If your caregiver has given you a follow-up appointment, it is very important to keep that appointment. Not keeping the appointment could result in a permanent injury and/or lasting (chronic) pain  and/or disability. If there is any problem keeping the appointment, you must call to reschedule.  SEEK IMMEDIATE MEDICAL CARE IF:   Your pain is not gone in 24 hours.   Your pain becomes worse, changes location, or feels different.   You or your child has an oral temperature above 102 F (38.9 C), not controlled by medicine.   Your baby is older than 3 months with a rectal temperature of 102 F (38.9 C) or higher.   Your baby is 3 months old or younger with a rectal temperature of 100.4 F (38 C) or higher.   You have shaking chills.   You keep throwing up (vomiting) or cannot drink liquids.   There is blood in your vomit or you see blood in your bowel movements.   Your bowel movements become dark or black.   You have frequent bowel movements.   Your bowel movements stop (become blocked) or you cannot pass gas.   You have bloody, frequent, or painful urination.   You have yellow discoloration in the skin or whites of the eyes.   Your stomach becomes bloated or bigger.   You have dizziness or fainting.   You have chest or back pain.  MAKE SURE YOU:   Understand these instructions.   Will watch your condition.   Will get help right away if you are not doing well or get worse.  Document Released: 10/07/2005 Document Revised: 06/19/2011 Document Reviewed: 09/04/2009 [employer] Patient Information 2012 [employer]. 

## 2011-12-05 NOTE — ED Provider Notes (Signed)
History     CSN: [id number]  Arrival date & time 12/05/11  4098   First MD Initiated Contact with Patient 12/05/11 2017      Chief Complaint  Patient presents with  . Abdominal Pain    (Consider location/radiation/quality/duration/timing/severity/associated sxs/prior treatment) Patient is a 71 y.o. male presenting with abdominal pain. The history is provided by the patient.  Abdominal Pain The primary symptoms of the illness include abdominal pain and shortness of breath. The primary symptoms of the illness do not include fever, nausea, vomiting, diarrhea or dysuria. The current episode started 1 to 2 hours ago (Symptoms started while he was eating dinner). The onset of the illness was sudden. The problem has been rapidly improving.  The abdominal pain is located in the epigastric region. Pain radiation: Sternal area. The severity of the abdominal pain is 5/10. The abdominal pain is relieved by nothing. Exacerbated by: Palpation.  The illness is associated with eating. Risk factors for an acute abdominal problem include being elderly. Symptoms associated with the illness do not include anorexia or diaphoresis. Significant associated medical issues include GERD. Significant associated medical issues do not include gallstones.    Past Medical History  Diagnosis Date  . Other congenital hamartoses, not elsewhere classified   . Family history of ischemic heart disease   . Family history of malignant neoplasm of gastrointestinal tract   . Allergic rhinitis, cause unspecified   . Other and unspecified hyperlipidemia   . Unspecified essential hypertension   . Obesity hypoventilation syndrome     failed cpap/bipap trial  . Hypothyroidism   . GERD (gastroesophageal reflux disease)   . Arthritis     osteoarthritis  . DEMENTIA     Alzheimer's, senile dementia  . Shortness of breath   . Cancer     renal carcinoma  . Sleep apnea     OXYGEN 24 HR/DAY-NO CPAP  . COPD (chronic obstructive  pulmonary disease)     DR. Marcelyn Bruins FOLLOWS PT--PT USING OXYGEN 24 HRS A DAY  . COPD (chronic obstructive pulmonary disease)     PULMONARY CLEARANCE FOR LEFT RENAL CRYOABLATION ON CHART FROM DR. CLANCE -07/26/11  . Coronary artery disease     CARDIAC CLEARANCE  OFFICE NOTE FOR LEFT CRYO ABLATION ON CHART FROM [name] -"RECENT STRESS TEST DID NOT SHOW ISCHEMIA" -"PRESUMED CAD DUE TO CORONARY CALCIFICATIONS"     Past Surgical History  Procedure Date  . Eye surgery     bilateral cataracts  . Joint replacement     bilateral knee replacements  . Carpal tunnel release     right  . Thyroidectomy   . Cholecystectomy 2009  . Knee arthroplasty     2005 LEFT   AND 2007 RIGHT--[name]  . Tumor removal 09/14/2011    Family History  Problem Relation Age of Onset  . Cancer Other     colon  . Heart disease Other     CAD, MI and Heart Attack    History  Substance Use Topics  . Smoking status: Never Smoker   . Smokeless tobacco: Never Used  . Alcohol Use: No      Review of Systems  Constitutional: Negative for fever and diaphoresis.  Respiratory: Positive for shortness of breath.        Patient states that this evening he felt short of breath during his dinner but then he realized his oxygen is not plugged in and when he plugged his oxygen and shortness of breath improved  Gastrointestinal: Positive for abdominal pain. Negative for nausea, vomiting, diarrhea and anorexia.  Genitourinary: Negative for dysuria.  All other systems reviewed and are negative.    Allergies  Review of patient's allergies indicates no known allergies.  Home Medications   Current Outpatient Rx  Name Route Sig Dispense Refill  . ACETAMINOPHEN 325 MG PO TABS Oral Take 650 mg by mouth every 6 (six) hours as needed. USED FOR PAIN    . ALPRAZOLAM 0.5 MG PO TABS Oral Take 0.5 mg by mouth 2 (two) times daily.     . ASPIRIN 325 MG PO TABS Oral Take 325 mg by mouth at bedtime.     Marland Kitchen BISACODYL 5 MG  PO TBEC Oral Take 10 mg by mouth daily as needed. FOR CONSTIPATION.     Marland Kitchen CALCITRIOL 0.5 MCG PO CAPS Oral Take 0.5 mcg by mouth 2 (two) times daily.     Marland Kitchen CALCIUM-VITAMIN D 500-200 MG-UNIT PO TABS Oral Take 1 tablet by mouth 2 (two) times daily with a meal.     . CLOPIDOGREL BISULFATE 75 MG PO TABS Oral Take 75 mg by mouth every morning.     . CYCLOSPORINE 0.05 % OP EMUL Both Eyes Place 1 drop into both eyes 2 (two) times daily.     Marland Kitchen LEVOTHYROXINE SODIUM 200 MCG PO TABS Oral Take 200 mcg by mouth daily. TAKE WITH THE 25 MCG LEVOTHYROXINE    . LEVOTHYROXINE SODIUM 25 MCG PO TABS Oral Take 25 mcg by mouth daily.    Marland Kitchen LORAZEPAM 0.5 MG PO TABS Oral Take 0.5 mg by mouth 2 (two) times daily. agitation    . LOTEPREDNOL ETABONATE 0.5 % OP SUSP Both Eyes Place 1 drop into both eyes every morning.     Marland Kitchen MELATONIN 2.5-338 MG-MCG SL SUBL Sublingual Place 1 tablet under the tongue at bedtime.    Marland Kitchen METOPROLOL TARTRATE 25 MG PO TABS Oral Take 12.5 mg by mouth 2 (two) times daily.     Marland Kitchen PANTOPRAZOLE SODIUM 40 MG PO TBEC Oral Take 40 mg by mouth daily.     Marland Kitchen PREGABALIN 75 MG PO CAPS Oral Take 75 mg by mouth 2 (two) times daily.    Bernadette Hoit SODIUM 8.6-50 MG PO TABS Oral Take 2 tablets by mouth 2 (two) times daily.    Marland Kitchen SIMETHICONE 80 MG PO CHEW Oral Chew 80 mg by mouth 4 (four) times daily. 4 times daily BETWEEN MEALS AND AT BEDTIME    . SUCRALFATE 1 G PO TABS Oral Take 1 g by mouth 4 (four) times daily.     Marland Kitchen TAMSULOSIN HCL 0.4 MG PO CAPS Oral Take 0.4 mg by mouth daily.     . TRAMADOL HCL 50 MG PO TABS Oral Take 50 mg by mouth every 6 (six) hours as needed. USED FOR PAIN    . WHITE PETROLATUM GEL Topical Apply 1 application topically as needed. APPLY TO AFFECTED AREAS ( RIGHT INNER BUTTOCK) AT BEDTIME AFTER WASHING)    . ZOLPIDEM TARTRATE 10 MG PO TABS Oral Take 10 mg by mouth at bedtime.       BP 101/58  Pulse 65  Temp(Src) 97.4 F (36.3 C) (Oral)  Resp 18  SpO2 98%  Physical Exam    Nursing note and vitals reviewed. Constitutional: He is oriented to person, place, and time. He appears well-developed and well-nourished. No distress.  HENT:  Head: Normocephalic and atraumatic.  Mouth/Throat: Oropharynx is clear and moist.  Eyes: Conjunctivae and EOM  are normal. Pupils are equal, round, and reactive to light.  Neck: Normal range of motion. Neck supple.  Cardiovascular: Normal rate, regular rhythm and intact distal pulses.   No murmur heard. Pulmonary/Chest: Effort normal and breath sounds normal. No respiratory distress. He has no wheezes. He has no rales. He exhibits tenderness.    Abdominal: Soft. He exhibits no distension. There is tenderness in the epigastric area. There is no rebound, no guarding and no CVA tenderness.  Musculoskeletal: Normal range of motion. He exhibits edema. He exhibits no tenderness.       Feet:  Neurological: He is alert and oriented to person, place, and time.  Skin: Skin is warm and dry. No rash noted. No erythema.  Psychiatric: He has a normal mood and affect. His behavior is normal.    ED Course  Procedures (including critical care time)  Labs Reviewed  CBC - Abnormal; Notable for the following:    RBC 3.79 (*)    Hemoglobin 11.9 (*)    HCT 36.8 (*)    All other components within normal limits  BASIC METABOLIC PANEL - Abnormal; Notable for the following:    Glucose, Bld 104 (*)    GFR calc non Af Amer 55 (*)    GFR calc Af Amer 63 (*)    All other components within normal limits  URINALYSIS, ROUTINE W REFLEX MICROSCOPIC - Abnormal; Notable for the following:    APPearance CLOUDY (*)    All other components within normal limits  DIFFERENTIAL  CARDIAC PANEL(CRET KIN+CKTOT+MB+TROPI)   Dg Chest 2 View  12/05/2011  *RADIOLOGY REPORT*  Clinical Data: Chest pain, shortness of breath, cough  CHEST - 2 VIEW  Comparison: 06/18/2011  Findings: Shallow inspiration.  Cardiac enlargement with mild pulmonary vascular congestion similar  to previous study.  Linear fibrosis or atelectasis in both lung bases.  No blunting of costophrenic angles.  No pneumothorax.  Degenerative changes in the thoracic spine and shoulders.  IMPRESSION: Cardiac enlargement with mild pulmonary vascular congestion.  No edema.  Linear atelectasis or fibrosis in the lung bases.  Original Report Authenticated By: Marlon Pel, M.D.     Date: 12/05/2011  Rate: 66  Rhythm: normal sinus rhythm  QRS Axis: left  Intervals: normal  ST/T Wave abnormalities: nonspecific ST changes  Conduction Disutrbances:none  Narrative Interpretation:   Old EKG Reviewed: unchanged    No diagnosis found.    MDM   Patient is here complaining of a vague abdominal pain and some shortness of breath that started while he was eating tonight. He states when he was short of breath he realized that his oxygen was not plugged in and once he contacted his oxygen the shortness of breath improved. He states he gets this chest/abdominal pain daily and has been told in the past it's arthritis, fibromyalgia, indigestion. Patient was recently here last week and had a CT of his abdomen and pelvis which showed no significant findings other than constipation. On exam patient has some mild diffuse tenderness in the abdomen without peritoneal findings. He is well-appearing and not moving around without difficulty. Patient denies nausea, vomiting, diarrhea, fever. EKG is unchanged and chest pain is not suggestive of cardiac pathology. Patient is tender to palpation in the sternum and the right ribs and states in the past he has had pneumonia but denies any cough today. On his CT last week he had chronic scarring in the right lower lobe. CBC, BMP, cardiac enzymes, EKG, UA all within normal limits.  Chest x-ray pending. Patient given GI cocktail to see if this improved his symptoms.  11:15 PM The patient feels much better after GI cocktail. Chest x-ray within normal limits. Will discharge  him home and he will followup with Dr. Sander Radon next week.       Gwyneth Sprout, MD 12/05/11 2316

## 2011-12-27 ENCOUNTER — Ambulatory Visit (INDEPENDENT_AMBULATORY_CARE_PROVIDER_SITE_OTHER): Payer: Medicare Other | Admitting: Pulmonary Disease

## 2011-12-27 ENCOUNTER — Encounter: Payer: Self-pay | Admitting: Pulmonary Disease

## 2011-12-27 VITALS — BP 90/64 | HR 87 | Temp 98.0°F | Ht 76.0 in | Wt 283.2 lb

## 2011-12-27 DIAGNOSIS — G4733 Obstructive sleep apnea (adult) (pediatric): Secondary | ICD-10-CM

## 2011-12-27 NOTE — Progress Notes (Signed)
  Subjective:    Patient ID: [name] [name], male    DOB: 12/12/1940, 71 y.o.   MRN: [id number]  HPI The patient comes in today for followup of his obstructive sleep apnea.  He was started on CPAP at the last visit as a trial, and states that he has worn fairly consistently since that time.  He is not sure that it is a viable treatment option for him, because of the irritation to his nose.  The patient states that he has had rhinorrhea and also some bloody discharge since being on CPAP.  From his description, it sounds that his heated humidifier may be set too high.  He does feel that he is sleeping better with improved daytime alertness, but is unsure if this is due to the CPAP or his sleeping medications.   Review of Systems  Constitutional: Positive for unexpected weight change. Negative for fever.  HENT: Positive for nosebleeds, congestion, rhinorrhea, sneezing and sinus pressure. Negative for ear pain, sore throat, trouble swallowing, dental problem and postnasal drip.   Eyes: Positive for itching. Negative for redness.  Respiratory: Positive for cough, chest tightness and shortness of breath. Negative for wheezing.   Cardiovascular: Positive for palpitations and leg swelling.  Gastrointestinal: Negative for nausea and vomiting.  Genitourinary: Negative for dysuria.  Musculoskeletal: Negative for joint swelling.  Skin: Positive for rash.  Neurological: Positive for headaches.  Hematological: Does not bruise/bleed easily.  Psychiatric/Behavioral: Negative for dysphoric mood. The patient is nervous/anxious.        Objective:   Physical Exam Overweight male in no acute distress No skin breakdown or pressure necrosis from the CPAP mask Lower extremities with mild edema, no cyanosis Alert and oriented, moves all 4 extremities.       Assessment & Plan:

## 2011-12-27 NOTE — Patient Instructions (Addendum)
Will decrease humidity on your cpap to see if it will dry up your nose.  Please call me in 2 weeks with your response.  If you are still having poor tolerance of cpap, will discontinue and just use oxygen alone at night while sleeping.

## 2011-12-27 NOTE — Assessment & Plan Note (Signed)
The patient has moderate obstructive sleep apnea, but is having ongoing issues with his CPAP.  It is really unclear to me whether this is a viable treatment option for him.  I think that we should turn down the heat on his humidifier, and if he continues to have tolerance issues, would discontinue and treat with supplemental oxygen at night alone.  He is to call me in 2 weeks with his response.

## 2012-01-02 ENCOUNTER — Ambulatory Visit (INDEPENDENT_AMBULATORY_CARE_PROVIDER_SITE_OTHER): Payer: Medicare Other | Admitting: Endocrinology

## 2012-01-02 ENCOUNTER — Encounter: Payer: Self-pay | Admitting: Endocrinology

## 2012-01-02 VITALS — BP 102/52 | HR 65 | Temp 97.4°F | Wt 285.8 lb

## 2012-01-02 DIAGNOSIS — E209 Hypoparathyroidism, unspecified: Secondary | ICD-10-CM

## 2012-01-02 NOTE — Progress Notes (Signed)
Subjective:    Patient ID: [name] [name], male    DOB: 07-30-1941, 71 y.o.   MRN: [id number]  HPI Pt had thyroidectomy approx 1960. He says pathol was benign. Since then, he has hypothyroidism and hypoparathyroidism.  He has intermittent muscle cramps.  He lives at [facility].  Past Medical History  Diagnosis Date  . Other congenital hamartoses, not elsewhere classified   . Family history of ischemic heart disease   . Family history of malignant neoplasm of gastrointestinal tract   . Allergic rhinitis, cause unspecified   . Other and unspecified hyperlipidemia   . Unspecified essential hypertension   . Obesity hypoventilation syndrome     failed cpap/bipap trial  . Hypothyroidism   . GERD (gastroesophageal reflux disease)   . Arthritis     osteoarthritis  . DEMENTIA     Alzheimer's, senile dementia  . Shortness of breath   . Cancer     renal carcinoma  . Sleep apnea     OXYGEN 24 HR/DAY-NO CPAP  . COPD (chronic obstructive pulmonary disease)     DR. Marcelyn Bruins FOLLOWS PT--PT USING OXYGEN 24 HRS A DAY  . COPD (chronic obstructive pulmonary disease)     PULMONARY CLEARANCE FOR LEFT RENAL CRYOABLATION ON CHART FROM DR. CLANCE -07/26/11  . Coronary artery disease     CARDIAC CLEARANCE  OFFICE NOTE FOR LEFT CRYO ABLATION ON CHART FROM [name] -"RECENT STRESS TEST DID NOT SHOW ISCHEMIA" -"PRESUMED CAD DUE TO CORONARY CALCIFICATIONS"     Past Surgical History  Procedure Date  . Eye surgery     bilateral cataracts  . Joint replacement     bilateral knee replacements  . Carpal tunnel release     right  . Thyroidectomy   . Cholecystectomy 2009  . Knee arthroplasty     2005 LEFT   AND 2007 RIGHT--[name]  . Tumor removal 09/14/2011    History   Social History  . Marital Status: Widowed    Spouse Name: N/A    Number of Children: Y  . Years of Education: N/A   Occupational History  . retired    Social History Main Topics  . Smoking status: Never  Smoker   . Smokeless tobacco: Never Used  . Alcohol Use: No  . Drug Use: No  . Sexually Active: Not on file   Other Topics Concern  . Not on file   Social History Narrative   Lives at [facility]    Current Outpatient Prescriptions on File Prior to Visit  Medication Sig Dispense Refill  . acetaminophen (TYLENOL) 325 MG tablet Take 650 mg by mouth every 6 (six) hours as needed. USED FOR PAIN      . ALPRAZolam (XANAX) 0.5 MG tablet Take 0.5 mg by mouth 2 (two) times daily.       Marland Kitchen aspirin 325 MG tablet Take 325 mg by mouth at bedtime.       . bisacodyl (DULCOLAX) 5 MG EC tablet Take 10 mg by mouth daily as needed. FOR CONSTIPATION.       . calcitRIOL (ROCALTROL) 0.5 MCG capsule Take 0.5 mcg by mouth 2 (two) times daily.       . Calcium Carbonate-Vitamin D (CALCIUM-VITAMIN D) 500-200 MG-UNIT per tablet Take 1 tablet by mouth 2 (two) times daily with a meal.       . clopidogrel (PLAVIX) 75 MG tablet Take 75 mg by mouth every morning.       . cycloSPORINE (RESTASIS)  0.05 % ophthalmic emulsion Place 1 drop into both eyes 2 (two) times daily.       Marland Kitchen ipratropium (ATROVENT) 0.02 % nebulizer solution Take 500 mcg by nebulization 4 (four) times daily as needed.      Marland Kitchen levothyroxine (SYNTHROID, LEVOTHROID) 200 MCG tablet Take 200 mcg by mouth daily. TAKE WITH THE 25 MCG LEVOTHYROXINE      . levothyroxine (SYNTHROID, LEVOTHROID) 25 MCG tablet Take 25 mcg by mouth daily.      Marland Kitchen LORazepam (ATIVAN) 0.5 MG tablet Take 0.5 mg by mouth 2 (two) times daily. agitation      . loteprednol (LOTEMAX) 0.5 % ophthalmic suspension Place 1 drop into both eyes every morning.       . Melatonin 2.5-338 MG-MCG SUBL Place 1 tablet under the tongue at bedtime.      . metoprolol tartrate (LOPRESSOR) 25 MG tablet Take 12.5 mg by mouth 2 (two) times daily.       . pantoprazole (PROTONIX) 40 MG tablet Take 40 mg by mouth daily.       . pregabalin (LYRICA) 75 MG capsule Take 75 mg by mouth 2 (two) times daily.      Marland Kitchen  senna-docusate (SENOKOT-S) 8.6-50 MG per tablet Take 2 tablets by mouth 2 (two) times daily.      . simethicone (GAS-X) 80 MG chewable tablet Chew 80 mg by mouth 4 (four) times daily. 4 times daily BETWEEN MEALS AND AT BEDTIME      . sodium phosphate (FLEET) 7-19 GM/118ML ENEM Place 1 enema rectally every other day as needed.      . sucralfate (CARAFATE) 1 G tablet Take 1 g by mouth 4 (four) times daily.       . Tamsulosin HCl (FLOMAX) 0.4 MG CAPS Take 0.4 mg by mouth daily.       . traMADol (ULTRAM) 50 MG tablet Take 50 mg by mouth every 6 (six) hours as needed. USED FOR PAIN      . white petrolatum (VASELINE) GEL Apply 1 application topically as needed. APPLY TO AFFECTED AREAS ( RIGHT INNER BUTTOCK) AT BEDTIME AFTER WASHING)      . zolpidem (AMBIEN) 10 MG tablet Take 10 mg by mouth at bedtime.         No Known Allergies  Family History  Problem Relation Age of Onset  . Cancer Other     colon  . Heart disease Other     CAD, MI and Heart Attack    BP 102/52  Pulse 65  Temp(Src) 97.4 F (36.3 C) (Oral)  Wt 285 lb 12.8 oz (129.638 kg)  SpO2 95%   Review of Systems Denies seizures.  No change in chronic sob    Objective:   Physical Exam Vital signs: see vs page Gen: elderly, frail, no distress.  obese  Lab Results  Component Value Date   CALCIUM 8.8 12/05/2011   PHOS 5.2* 12/25/2009   Lab Results  Component Value Date   TSH 3.649 05/12/2011   (i also reviewed other Ca++ levels--some are as high as 10.    Assessment & Plan:  Chronic postsurgical hypoparathyroidism, overcontrolled

## 2012-01-02 NOTE — Patient Instructions (Addendum)
Goal Ca++ should be between 8 and 9, rather than the "normal" range. Return here in 6 months.   For now, please reduce rocaltrol to 0.5 mcg po daily.   Instead of a combined Ca++/vit-d supplement, pt should take just Ca++ 1000 mg/day, any form.

## 2012-01-16 ENCOUNTER — Emergency Department (HOSPITAL_COMMUNITY): Payer: Medicare Other

## 2012-01-16 ENCOUNTER — Inpatient Hospital Stay (HOSPITAL_COMMUNITY)
Admission: EM | Admit: 2012-01-16 | Discharge: 2012-01-18 | DRG: 194 | Disposition: A | Discharge status: Home Health | Payer: Medicare Other | Attending: Internal Medicine | Admitting: Internal Medicine

## 2012-01-16 ENCOUNTER — Encounter (HOSPITAL_COMMUNITY): Payer: Self-pay | Admitting: *Deleted

## 2012-01-16 DIAGNOSIS — N400 Enlarged prostate without lower urinary tract symptoms: Secondary | ICD-10-CM | POA: Diagnosis present

## 2012-01-16 DIAGNOSIS — I10 Essential (primary) hypertension: Secondary | ICD-10-CM | POA: Diagnosis present

## 2012-01-16 DIAGNOSIS — F0280 Dementia in other diseases classified elsewhere without behavioral disturbance: Secondary | ICD-10-CM | POA: Diagnosis present

## 2012-01-16 DIAGNOSIS — E678 Other specified hyperalimentation: Secondary | ICD-10-CM | POA: Diagnosis present

## 2012-01-16 DIAGNOSIS — G4733 Obstructive sleep apnea (adult) (pediatric): Secondary | ICD-10-CM | POA: Diagnosis present

## 2012-01-16 DIAGNOSIS — E785 Hyperlipidemia, unspecified: Secondary | ICD-10-CM | POA: Diagnosis present

## 2012-01-16 DIAGNOSIS — J309 Allergic rhinitis, unspecified: Secondary | ICD-10-CM | POA: Diagnosis present

## 2012-01-16 DIAGNOSIS — Z8249 Family history of ischemic heart disease and other diseases of the circulatory system: Secondary | ICD-10-CM

## 2012-01-16 DIAGNOSIS — Z85528 Personal history of other malignant neoplasm of kidney: Secondary | ICD-10-CM

## 2012-01-16 DIAGNOSIS — J4489 Other specified chronic obstructive pulmonary disease: Secondary | ICD-10-CM | POA: Diagnosis present

## 2012-01-16 DIAGNOSIS — Z23 Encounter for immunization: Secondary | ICD-10-CM

## 2012-01-16 DIAGNOSIS — E890 Postprocedural hypothyroidism: Secondary | ICD-10-CM | POA: Diagnosis present

## 2012-01-16 DIAGNOSIS — Z7902 Long term (current) use of antithrombotics/antiplatelets: Secondary | ICD-10-CM

## 2012-01-16 DIAGNOSIS — J189 Pneumonia, unspecified organism: Secondary | ICD-10-CM

## 2012-01-16 DIAGNOSIS — G309 Alzheimer's disease, unspecified: Secondary | ICD-10-CM | POA: Diagnosis present

## 2012-01-16 DIAGNOSIS — E860 Dehydration: Secondary | ICD-10-CM | POA: Diagnosis present

## 2012-01-16 DIAGNOSIS — Z96659 Presence of unspecified artificial knee joint: Secondary | ICD-10-CM

## 2012-01-16 DIAGNOSIS — Z7982 Long term (current) use of aspirin: Secondary | ICD-10-CM

## 2012-01-16 DIAGNOSIS — I2510 Atherosclerotic heart disease of native coronary artery without angina pectoris: Secondary | ICD-10-CM | POA: Diagnosis present

## 2012-01-16 DIAGNOSIS — E662 Morbid (severe) obesity with alveolar hypoventilation: Secondary | ICD-10-CM | POA: Diagnosis present

## 2012-01-16 DIAGNOSIS — Z79899 Other long term (current) drug therapy: Secondary | ICD-10-CM

## 2012-01-16 DIAGNOSIS — J9610 Chronic respiratory failure, unspecified whether with hypoxia or hypercapnia: Secondary | ICD-10-CM | POA: Diagnosis present

## 2012-01-16 DIAGNOSIS — M1990 Unspecified osteoarthritis, unspecified site: Secondary | ICD-10-CM | POA: Diagnosis present

## 2012-01-16 DIAGNOSIS — J449 Chronic obstructive pulmonary disease, unspecified: Secondary | ICD-10-CM | POA: Diagnosis present

## 2012-01-16 DIAGNOSIS — K219 Gastro-esophageal reflux disease without esophagitis: Secondary | ICD-10-CM | POA: Diagnosis present

## 2012-01-16 DIAGNOSIS — I110 Hypertensive heart disease with heart failure: Secondary | ICD-10-CM | POA: Diagnosis present

## 2012-01-16 LAB — CBC
HCT: 40.3 % (ref 39.0–52.0)
Hemoglobin: 13.6 g/dL (ref 13.0–17.0)
MCHC: 33.7 g/dL (ref 30.0–36.0)
MCV: 95.7 fL (ref 78.0–100.0)
RDW: 14.1 % (ref 11.5–15.5)

## 2012-01-16 LAB — DIFFERENTIAL
Basophils Absolute: 0 10*3/uL (ref 0.0–0.1)
Basophils Relative: 0 % (ref 0–1)
Eosinophils Relative: 0 % (ref 0–5)
Monocytes Absolute: 0.3 10*3/uL (ref 0.1–1.0)
Monocytes Relative: 5 % (ref 3–12)
Neutro Abs: 4.5 10*3/uL (ref 1.7–7.7)

## 2012-01-16 LAB — URINALYSIS, ROUTINE W REFLEX MICROSCOPIC
Bilirubin Urine: NEGATIVE
Glucose, UA: NEGATIVE mg/dL
Hgb urine dipstick: NEGATIVE
Specific Gravity, Urine: 1.023 (ref 1.005–1.030)

## 2012-01-16 LAB — BASIC METABOLIC PANEL
BUN: 24 mg/dL — ABNORMAL HIGH (ref 6–23)
CO2: 29 mEq/L (ref 19–32)
Calcium: 7.7 mg/dL — ABNORMAL LOW (ref 8.4–10.5)
Chloride: 97 mEq/L (ref 96–112)
Creatinine, Ser: 1.27 mg/dL (ref 0.50–1.35)
GFR calc Af Amer: 63 mL/min — ABNORMAL LOW (ref >90)

## 2012-01-16 LAB — URINE MICROSCOPIC-ADD ON

## 2012-01-16 MED ORDER — SODIUM CHLORIDE 0.9 % IV BOLUS (SEPSIS)
1000.0000 mL | Freq: Once | INTRAVENOUS | Status: AC
  Administered 2012-01-16: 1000 mL via INTRAVENOUS

## 2012-01-16 MED ORDER — PIPERACILLIN-TAZOBACTAM 3.375 G IVPB
3.3750 g | Freq: Once | INTRAVENOUS | Status: AC
  Administered 2012-01-16: 3.375 g via INTRAVENOUS
  Filled 2012-01-16: qty 50

## 2012-01-16 MED ORDER — IOHEXOL 350 MG/ML SOLN
100.0000 mL | Freq: Once | INTRAVENOUS | Status: AC | PRN
  Administered 2012-01-16: 100 mL via INTRAVENOUS

## 2012-01-16 MED ORDER — VANCOMYCIN HCL IN DEXTROSE 1-5 GM/200ML-% IV SOLN
1000.0000 mg | Freq: Once | INTRAVENOUS | Status: AC
  Administered 2012-01-16: 1000 mg via INTRAVENOUS
  Filled 2012-01-16: qty 200

## 2012-01-16 NOTE — ED Provider Notes (Signed)
Date: 01/16/2012  Rate:85  Rhythm: normal sinus rhythm  QRS Axis: left  Intervals: normal QRS:  Poor R wave progression in precordial leads suggests possible old anterior myocardial infarction.  ST/T Wave abnormalities: normal  Conduction Disutrbances:none  Narrative Interpretation: Abnormal EKG  Old EKG Reviewed: unchanged    Carleene Cooper III, MD 01/16/12 [phone]

## 2012-01-16 NOTE — ED Notes (Signed)
Pt reports standing from his wheelchair and falling down.  Denies LOC, denies pain, denies any injury.  Pt alert and oriented X4.  Pt is wheel chair dependent at nursing home.

## 2012-01-16 NOTE — ED Notes (Signed)
Patient is unable to do the standing portion.

## 2012-01-16 NOTE — ED Notes (Signed)
Patient slid from wheelchair to floor trying to get in recliner, patient denies any pain, patient's facility decided to send to Ed for observation and for patient to be seen for Altered Mental Status x 3 days.  Facility stated patient not acting like self.

## 2012-01-16 NOTE — ED Notes (Signed)
Patient denies any pain at this time, denies injuries.

## 2012-01-16 NOTE — ED Notes (Signed)
Pt states he has no pain just slight discomfort in shoulders and elbows.

## 2012-01-16 NOTE — ED Provider Notes (Signed)
History     CSN: [id number]  Arrival date & time 01/16/12  1629   First MD Initiated Contact with Patient 01/16/12 1743      Chief Complaint  Patient presents with  . Fall    sitting position  . Altered Mental Status    x 2 days    (Consider location/radiation/quality/duration/timing/severity/associated sxs/prior treatment) HPI History provided by pt and SNF staff.  Pt reports that he fell while transferring himself from wheelchair to recliner this afternoon.  He missed the chair while sitting down and landed on his buttocks.  Did not hit his head and denies neck/back pain or pain anywhere else.  Per nurse at [facility], pt has been very drowsy as well as short of breath today.  Did not want to participate in speech therapy, which is unusual for him.  Therapist walked him back to his room, he sat down and then rolled out of his chair.  Pt reports that he has been feeling fatigued and intermittently lightheaded for the past two days.  Has had worse than baseline SOB today.  Denies recent fever, cough, chest pain, vomiting, diarrhea, abd pain, urinary sx and blood in stool.  Believes he may be dehydrated.   Past Medical History  Diagnosis Date  . Other congenital hamartoses, not elsewhere classified   . Family history of ischemic heart disease   . Family history of malignant neoplasm of gastrointestinal tract   . Allergic rhinitis, cause unspecified   . Other and unspecified hyperlipidemia   . Unspecified essential hypertension   . Obesity hypoventilation syndrome     failed cpap/bipap trial  . Hypothyroidism   . GERD (gastroesophageal reflux disease)   . Arthritis     osteoarthritis  . DEMENTIA     Alzheimer's, senile dementia  . Shortness of breath   . Cancer     renal carcinoma  . Sleep apnea     OXYGEN 24 HR/DAY-NO CPAP  . COPD (chronic obstructive pulmonary disease)     DR. Marcelyn Bruins FOLLOWS PT--PT USING OXYGEN 24 HRS A DAY  . COPD (chronic obstructive pulmonary  disease)     PULMONARY CLEARANCE FOR LEFT RENAL CRYOABLATION ON CHART FROM DR. CLANCE -07/26/11  . Coronary artery disease     CARDIAC CLEARANCE  OFFICE NOTE FOR LEFT CRYO ABLATION ON CHART FROM [name] -"RECENT STRESS TEST DID NOT SHOW ISCHEMIA" -"PRESUMED CAD DUE TO CORONARY CALCIFICATIONS"     Past Surgical History  Procedure Date  . Eye surgery     bilateral cataracts  . Joint replacement     bilateral knee replacements  . Carpal tunnel release     right  . Thyroidectomy   . Cholecystectomy 2009  . Knee arthroplasty     2005 LEFT   AND 2007 RIGHT--[name]  . Tumor removal 09/14/2011    Family History  Problem Relation Age of Onset  . Cancer Other     colon  . Heart disease Other     CAD, MI and Heart Attack    History  Substance Use Topics  . Smoking status: Never Smoker   . Smokeless tobacco: Never Used  . Alcohol Use: No      Review of Systems  All other systems reviewed and are negative.    Allergies  Review of patient's allergies indicates no known allergies.  Home Medications   Current Outpatient Rx  Name Route Sig Dispense Refill  . ACETAMINOPHEN 325 MG PO TABS Oral Take 650  mg by mouth every 6 (six) hours as needed. USED FOR PAIN/STIFFNESS    . ASPIRIN 325 MG PO TABS Oral Take 325 mg by mouth at bedtime.     Marland Kitchen BISACODYL 5 MG PO TBEC Oral Take 10 mg by mouth daily as needed. FOR CONSTIPATION.     Marland Kitchen CALCITRIOL 0.5 MCG PO CAPS Oral Take 0.5 mcg by mouth daily.     Marland Kitchen CALCIUM CARBONATE-VITAMIN D 500-200 MG-UNIT PO TABS Oral Take 1 tablet by mouth 2 (two) times daily.     Marland Kitchen CLOPIDOGREL BISULFATE 75 MG PO TABS Oral Take 75 mg by mouth every morning.     . CYCLOSPORINE 0.05 % OP EMUL Both Eyes Place 1 drop into both eyes 2 (two) times daily.     Marland Kitchen FLUTICASONE PROPIONATE 50 MCG/ACT NA SUSP Nasal Place 2 sprays into the nose daily.    . IPRATROPIUM BROMIDE 0.02 % IN SOLN Nebulization Take 500 mcg by nebulization every 4 (four) hours as needed. For  wheezing    . LEVOTHYROXINE SODIUM 200 MCG PO TABS Oral Take 200 mcg by mouth daily. TAKE WITH THE 25 MCG LEVOTHYROXINE    . LEVOTHYROXINE SODIUM 25 MCG PO TABS Oral Take 25 mcg by mouth daily. Take with of levothyroxine    . LORATADINE 10 MG PO TABS Oral Take 10 mg by mouth daily. Take for 3 weeks    . LORAZEPAM 0.5 MG PO TABS Oral Take 0.5 mg by mouth 2 (two) times daily.     Marland Kitchen LOTEPREDNOL ETABONATE 0.5 % OP SUSP Both Eyes Place 1 drop into both eyes every morning.     Marland Kitchen MELATONIN 2.5-338 MG-MCG SL SUBL Sublingual Place 1 tablet under the tongue at bedtime.    Marland Kitchen METOPROLOL TARTRATE 25 MG PO TABS Oral Take 12.5 mg by mouth 2 (two) times daily. Hold for systolic blood pressure <100    . PANTOPRAZOLE SODIUM 40 MG PO TBEC Oral Take 40 mg by mouth daily.     Marland Kitchen PHOSPHATE ENEMA 7-19 GM/118ML RE ENEM Rectal Place 1 enema rectally every other day as needed. For constipation    . PREGABALIN 75 MG PO CAPS Oral Take 75 mg by mouth 2 (two) times daily.    Bernadette Hoit SODIUM 8.6-50 MG PO TABS Oral Take 2 tablets by mouth 2 (two) times daily.    Marland Kitchen SIMETHICONE 80 MG PO CHEW Oral Chew 80 mg by mouth 4 (four) times daily. 4 times daily BETWEEN MEALS AND AT BEDTIME    . SUCRALFATE 1 G PO TABS Oral Take 1 g by mouth 4 (four) times daily.     Marland Kitchen TAMSULOSIN HCL 0.4 MG PO CAPS Oral Take 0.4 mg by mouth at bedtime. Hold for systolic blood pressure of <100    . TRAMADOL HCL 50 MG PO TABS Oral Take 50 mg by mouth every 6 (six) hours as needed. USED FOR PAIN    . WHITE PETROLATUM GEL Topical Apply 1 application topically as needed. APPLY TO AFFECTED AREAS ( RIGHT INNER BUTTOCK) AT BEDTIME AFTER WASHING)    . ZOLPIDEM TARTRATE 10 MG PO TABS Oral Take 10 mg by mouth at bedtime.       BP 101/66  Pulse 87  Temp(Src) 98.9 F (37.2 C) (Oral)  Resp 16  SpO2 100%  Physical Exam  Nursing note and vitals reviewed. Constitutional: He is oriented to person, place, and time. He appears well-developed and  well-nourished. No distress.  HENT:  Head: Normocephalic and  atraumatic.       Dry mucous membranes  Eyes:       Normal appearance. Conjunctiva pink.    Neck: Normal range of motion.  Cardiovascular: Normal rate and regular rhythm.   Pulmonary/Chest: Breath sounds normal. He has no wheezes. He has no rales.       Pt sitting on edge of bed.  Mildly dyspneic/tachypneic   Musculoskeletal: Normal range of motion.  Neurological: He is alert and oriented to person, place, and time.  Skin: Skin is warm and dry. No rash noted.  Psychiatric: He has a normal mood and affect. His behavior is normal.    ED Course  Procedures (including critical care time)  Labs Reviewed  CBC - Abnormal; Notable for the following:    RBC 4.21 (*)    All other components within normal limits  DIFFERENTIAL - Abnormal; Notable for the following:    Neutrophils Relative 82 (*)    All other components within normal limits  BASIC METABOLIC PANEL - Abnormal; Notable for the following:    Potassium 3.4 (*)    Glucose, Bld 100 (*)    BUN 24 (*)    Calcium 7.7 (*)    GFR calc non Af Amer 55 (*)    GFR calc Af Amer 63 (*)    All other components within normal limits  POCT I-STAT TROPONIN I  URINALYSIS, ROUTINE W REFLEX MICROSCOPIC   Dg Chest 2 View  01/16/2012  *RADIOLOGY REPORT*  Clinical Data: Chest pain.  Altered mental status.  CHEST - 2 VIEW  Comparison: 12/05/2011.  Findings: Stable enlarged cardiac silhouette and mildly elevated left hemidiaphragm.  Clear lungs.  Bilateral shoulder degenerative changes.  IMPRESSION: Stable cardiomegaly.  No acute abnormality.  Original Report Authenticated By: Darrol Angel, M.D.     1. Healthcare-associated pneumonia       MDM  Pt sent from nursing home for drowsiness, lightheadedness, SOB and fall on buttocks today.  On exam, afebrile, tachypneic, dehydrated, lungs CTA.  CNA attempted to obtain orthostatic VS and reports that his O2 saturation dropped from upper 90s  to the 70s when he sat up in bed.  No significant lab findings.  CT angio chest neg for PE but shows bilateral pneumonia.  Pt received IV vanc/zosyn to treat HCAP and triad consulted for admission.        Otilio Miu, [place] 01/17/12 [phone]

## 2012-01-17 ENCOUNTER — Inpatient Hospital Stay (HOSPITAL_COMMUNITY): Payer: Medicare Other

## 2012-01-17 ENCOUNTER — Encounter (HOSPITAL_COMMUNITY): Payer: Self-pay | Admitting: Internal Medicine

## 2012-01-17 DIAGNOSIS — J189 Pneumonia, unspecified organism: Secondary | ICD-10-CM

## 2012-01-17 LAB — CARDIAC PANEL(CRET KIN+CKTOT+MB+TROPI)
CK, MB: 1.2 ng/mL (ref 0.3–4.0)
Troponin I: <0.3 ng/mL (ref <0.30)

## 2012-01-17 LAB — CBC
HCT: 35.8 % — ABNORMAL LOW (ref 39.0–52.0)
Hemoglobin: 11.7 g/dL — ABNORMAL LOW (ref 13.0–17.0)
MCV: 96.8 fL (ref 78.0–100.0)
RBC: 3.7 MIL/uL — ABNORMAL LOW (ref 4.22–5.81)
WBC: 4.1 10*3/uL (ref 4.0–10.5)

## 2012-01-17 LAB — COMPREHENSIVE METABOLIC PANEL
Albumin: 2.9 g/dL — ABNORMAL LOW (ref 3.5–5.2)
Alkaline Phosphatase: 82 U/L (ref 39–117)
BUN: 21 mg/dL (ref 6–23)
Creatinine, Ser: 1.26 mg/dL (ref 0.50–1.35)
Potassium: 3.3 mEq/L — ABNORMAL LOW (ref 3.5–5.1)
Total Protein: 6 g/dL (ref 6.0–8.3)

## 2012-01-17 LAB — MRSA PCR SCREENING: MRSA by PCR: NEGATIVE

## 2012-01-17 MED ORDER — LOTEPREDNOL ETABONATE 0.5 % OP SUSP
1.0000 [drp] | Freq: Every day | OPHTHALMIC | Status: DC
  Administered 2012-01-17 – 2012-01-18 (×2): 1 [drp] via OPHTHALMIC
  Filled 2012-01-17: qty 5

## 2012-01-17 MED ORDER — SUCRALFATE 1 G PO TABS
1.0000 g | ORAL_TABLET | Freq: Three times a day (TID) | ORAL | Status: DC
  Administered 2012-01-17 – 2012-01-18 (×6): 1 g via ORAL
  Filled 2012-01-17 (×10): qty 1

## 2012-01-17 MED ORDER — FLUTICASONE PROPIONATE 50 MCG/ACT NA SUSP
2.0000 | Freq: Every day | NASAL | Status: DC
  Administered 2012-01-17 – 2012-01-18 (×2): 2 via NASAL
  Filled 2012-01-17: qty 16

## 2012-01-17 MED ORDER — INFLUENZA VIRUS VACC SPLIT PF IM SUSP
0.5000 mL | INTRAMUSCULAR | Status: AC
  Administered 2012-01-18: 0.5 mL via INTRAMUSCULAR
  Filled 2012-01-17: qty 0.5

## 2012-01-17 MED ORDER — VANCOMYCIN HCL 1000 MG IV SOLR
1250.0000 mg | Freq: Two times a day (BID) | INTRAVENOUS | Status: DC
  Administered 2012-01-17 (×2): 1250 mg via INTRAVENOUS
  Filled 2012-01-17 (×4): qty 1250

## 2012-01-17 MED ORDER — PNEUMOCOCCAL VAC POLYVALENT 25 MCG/0.5ML IJ INJ
0.5000 mL | INJECTION | INTRAMUSCULAR | Status: AC
  Administered 2012-01-18: 0.5 mL via INTRAMUSCULAR
  Filled 2012-01-17: qty 0.5

## 2012-01-17 MED ORDER — TAMSULOSIN HCL 0.4 MG PO CAPS
0.4000 mg | ORAL_CAPSULE | Freq: Every day | ORAL | Status: DC
  Administered 2012-01-17: 0.4 mg via ORAL
  Filled 2012-01-17 (×2): qty 1

## 2012-01-17 MED ORDER — CLOPIDOGREL BISULFATE 75 MG PO TABS
75.0000 mg | ORAL_TABLET | Freq: Every day | ORAL | Status: DC
  Administered 2012-01-17 – 2012-01-18 (×2): 75 mg via ORAL
  Filled 2012-01-17 (×3): qty 1

## 2012-01-17 MED ORDER — SENNOSIDES-DOCUSATE SODIUM 8.6-50 MG PO TABS
2.0000 | ORAL_TABLET | Freq: Two times a day (BID) | ORAL | Status: DC
  Administered 2012-01-17 – 2012-01-18 (×3): 2 via ORAL
  Filled 2012-01-17 (×2): qty 1
  Filled 2012-01-17 (×2): qty 2

## 2012-01-17 MED ORDER — LEVOTHYROXINE SODIUM 200 MCG PO TABS
200.0000 ug | ORAL_TABLET | Freq: Every day | ORAL | Status: DC
  Filled 2012-01-17: qty 1

## 2012-01-17 MED ORDER — PANTOPRAZOLE SODIUM 40 MG PO TBEC
40.0000 mg | DELAYED_RELEASE_TABLET | Freq: Every day | ORAL | Status: DC
  Administered 2012-01-17 – 2012-01-18 (×2): 40 mg via ORAL
  Filled 2012-01-17: qty 1

## 2012-01-17 MED ORDER — ONDANSETRON HCL 4 MG PO TABS: 4.0000 mg | ORAL_TABLET | Freq: Four times a day (QID) | ORAL | Status: DC | PRN

## 2012-01-17 MED ORDER — LEVOTHYROXINE SODIUM 25 MCG PO TABS
225.0000 ug | ORAL_TABLET | Freq: Every day | ORAL | Status: DC
  Administered 2012-01-17 – 2012-01-18 (×2): 225 ug via ORAL
  Filled 2012-01-17 (×3): qty 1

## 2012-01-17 MED ORDER — METOPROLOL TARTRATE 12.5 MG HALF TABLET
12.5000 mg | ORAL_TABLET | Freq: Two times a day (BID) | ORAL | Status: DC
  Administered 2012-01-17 – 2012-01-18 (×3): 12.5 mg via ORAL
  Filled 2012-01-17 (×5): qty 1

## 2012-01-17 MED ORDER — LORAZEPAM 0.5 MG PO TABS
0.5000 mg | ORAL_TABLET | Freq: Two times a day (BID) | ORAL | Status: DC
  Administered 2012-01-17 – 2012-01-18 (×3): 0.5 mg via ORAL
  Filled 2012-01-17 (×3): qty 1

## 2012-01-17 MED ORDER — LORATADINE 10 MG PO TABS
10.0000 mg | ORAL_TABLET | Freq: Every day | ORAL | Status: DC
  Administered 2012-01-17 – 2012-01-18 (×2): 10 mg via ORAL
  Filled 2012-01-17 (×2): qty 1

## 2012-01-17 MED ORDER — IPRATROPIUM BROMIDE 0.02 % IN SOLN: 0.5000 mg | RESPIRATORY_TRACT | Status: DC | PRN

## 2012-01-17 MED ORDER — ASPIRIN 325 MG PO TABS
325.0000 mg | ORAL_TABLET | Freq: Every day | ORAL | Status: DC
  Administered 2012-01-17: 325 mg via ORAL
  Filled 2012-01-17 (×2): qty 1

## 2012-01-17 MED ORDER — ZOLPIDEM TARTRATE 5 MG PO TABS
10.0000 mg | ORAL_TABLET | Freq: Every day | ORAL | Status: DC
  Administered 2012-01-17: 10 mg via ORAL
  Filled 2012-01-17: qty 2

## 2012-01-17 MED ORDER — SODIUM CHLORIDE 0.9 % IJ SOLN: 3.0000 mL | Freq: Two times a day (BID) | INTRAMUSCULAR | Status: DC

## 2012-01-17 MED ORDER — CYCLOSPORINE 0.05 % OP EMUL
1.0000 [drp] | Freq: Two times a day (BID) | OPHTHALMIC | Status: DC
  Administered 2012-01-17 – 2012-01-18 (×3): 1 [drp] via OPHTHALMIC
  Filled 2012-01-17 (×4): qty 1

## 2012-01-17 MED ORDER — SIMETHICONE 80 MG PO CHEW
80.0000 mg | CHEWABLE_TABLET | Freq: Three times a day (TID) | ORAL | Status: DC
  Administered 2012-01-17 – 2012-01-18 (×6): 80 mg via ORAL
  Filled 2012-01-17 (×9): qty 1

## 2012-01-17 MED ORDER — ENOXAPARIN SODIUM 40 MG/0.4ML ~~LOC~~ SOLN
40.0000 mg | SUBCUTANEOUS | Status: DC
  Administered 2012-01-17 – 2012-01-18 (×2): 40 mg via SUBCUTANEOUS
  Filled 2012-01-17 (×2): qty 0.4

## 2012-01-17 MED ORDER — LEVOFLOXACIN IN D5W 750 MG/150ML IV SOLN
750.0000 mg | INTRAVENOUS | Status: DC
  Administered 2012-01-17 – 2012-01-18 (×2): 750 mg via INTRAVENOUS
  Filled 2012-01-17 (×3): qty 150

## 2012-01-17 MED ORDER — SODIUM CHLORIDE 0.9 % IV SOLN
INTRAVENOUS | Status: DC
  Administered 2012-01-17 (×2): via INTRAVENOUS

## 2012-01-17 MED ORDER — LEVOTHYROXINE SODIUM 25 MCG PO TABS: 25.0000 ug | ORAL_TABLET | Freq: Every day | ORAL | Status: DC

## 2012-01-17 MED ORDER — PREGABALIN 75 MG PO CAPS
75.0000 mg | ORAL_CAPSULE | Freq: Two times a day (BID) | ORAL | Status: DC
  Administered 2012-01-17 – 2012-01-18 (×3): 75 mg via ORAL
  Filled 2012-01-17 (×3): qty 1

## 2012-01-17 MED ORDER — LEVOFLOXACIN 750 MG PO TABS: 750.0000 mg | ORAL_TABLET | Freq: Every day | ORAL | Status: AC

## 2012-01-17 MED ORDER — TRAMADOL HCL 50 MG PO TABS
50.0000 mg | ORAL_TABLET | Freq: Four times a day (QID) | ORAL | Status: DC | PRN
  Filled 2012-01-17: qty 1

## 2012-01-17 MED ORDER — BISACODYL 5 MG PO TBEC
10.0000 mg | DELAYED_RELEASE_TABLET | Freq: Every day | ORAL | Status: DC | PRN
  Administered 2012-01-17: 10 mg via ORAL
  Filled 2012-01-17: qty 2

## 2012-01-17 MED ORDER — CALCITRIOL 0.5 MCG PO CAPS
0.5000 ug | ORAL_CAPSULE | Freq: Every day | ORAL | Status: DC
  Administered 2012-01-17 – 2012-01-18 (×2): 0.5 ug via ORAL
  Filled 2012-01-17 (×2): qty 1

## 2012-01-17 MED ORDER — DEXTROSE 5 % IV SOLN
1.0000 g | Freq: Two times a day (BID) | INTRAVENOUS | Status: DC
  Administered 2012-01-17 – 2012-01-18 (×4): 1 g via INTRAVENOUS
  Filled 2012-01-17 (×5): qty 1

## 2012-01-17 MED ORDER — ACETAMINOPHEN 650 MG RE SUPP: 650.0000 mg | Freq: Four times a day (QID) | RECTAL | Status: DC | PRN

## 2012-01-17 MED ORDER — ACETAMINOPHEN 325 MG PO TABS: 650.0000 mg | ORAL_TABLET | Freq: Four times a day (QID) | ORAL | Status: DC | PRN

## 2012-01-17 MED ORDER — ALBUTEROL SULFATE (5 MG/ML) 0.5% IN NEBU: 2.5000 mg | INHALATION_SOLUTION | RESPIRATORY_TRACT | Status: DC | PRN

## 2012-01-17 MED ORDER — ONDANSETRON HCL 4 MG/2ML IJ SOLN: 4.0000 mg | Freq: Four times a day (QID) | INTRAMUSCULAR | Status: DC | PRN

## 2012-01-17 NOTE — Discharge Summary (Addendum)
PATIENT DETAILS Name: [name] [name] Age: 71 y.o. Sex: male Date of Birth: January 28, 1941 MRN: [id number]. Admit Date: 01/16/2012 Admitting Physician: Eduard Clos, MD WUJ:WJXBJ,YNWGNFAO, MD, MD  PRIMARY DISCHARGE DIAGNOSIS:  Principal Problem:  *Healthcare-associated pneumonia Active Problems:  OBESITY HYPOVENTILATION SYNDROME  HYPERTENSION Chronic respiratory failure CAD Hypothyroidism COPD-on home 02 H/o renal cell carcinoma       PAST MEDICAL HISTORY: Past Medical History  Diagnosis Date  . Other congenital hamartoses, not elsewhere classified   . Family history of ischemic heart disease   . Family history of malignant neoplasm of gastrointestinal tract   . Allergic rhinitis, cause unspecified   . Other and unspecified hyperlipidemia   . Unspecified essential hypertension   . Obesity hypoventilation syndrome     failed cpap/bipap trial  . Hypothyroidism   . GERD (gastroesophageal reflux disease)   . Arthritis     osteoarthritis  . DEMENTIA     Alzheimer's, senile dementia  . Shortness of breath   . Cancer     renal carcinoma  . Sleep apnea     OXYGEN 24 HR/DAY-NO CPAP  . COPD (chronic obstructive pulmonary disease)     DR. Marcelyn Bruins FOLLOWS PT--PT USING OXYGEN 24 HRS A DAY  . COPD (chronic obstructive pulmonary disease)     PULMONARY CLEARANCE FOR LEFT RENAL CRYOABLATION ON CHART FROM DR. CLANCE -07/26/11  . Coronary artery disease     CARDIAC CLEARANCE  OFFICE NOTE FOR LEFT CRYO ABLATION ON CHART FROM [name] -"RECENT STRESS TEST DID NOT SHOW ISCHEMIA" -"PRESUMED CAD DUE TO CORONARY CALCIFICATIONS"     DISCHARGE MEDICATIONS: Medication List  As of 01/17/2012  2:27 PM   TAKE these medications         acetaminophen 325 MG tablet   Commonly known as: TYLENOL   Take 650 mg by mouth every 6 (six) hours as needed. USED FOR PAIN/STIFFNESS      aspirin 325 MG tablet   Take 325 mg by mouth at bedtime.      bisacodyl 5 MG EC tablet   Commonly  known as: DULCOLAX   Take 10 mg by mouth daily as needed. FOR CONSTIPATION.        calcitRIOL 0.5 MCG capsule   Commonly known as: ROCALTROL   Take 0.5 mcg by mouth daily.      calcium-vitamin D 500-200 MG-UNIT per tablet   Commonly known as: OSCAL WITH D   Take 1 tablet by mouth 2 (two) times daily.      clopidogrel 75 MG tablet   Commonly known as: PLAVIX   Take 75 mg by mouth every morning.      cycloSPORINE 0.05 % ophthalmic emulsion   Commonly known as: RESTASIS   Place 1 drop into both eyes 2 (two) times daily.      FLOMAX 0.4 MG Caps   Generic drug: Tamsulosin HCl   Take 0.4 mg by mouth at bedtime. Hold for systolic blood pressure of <100      fluticasone 50 MCG/ACT nasal spray   Commonly known as: FLONASE   Place 2 sprays into the nose daily.      GAS-X 80 MG chewable tablet   Generic drug: simethicone   Chew 80 mg by mouth 4 (four) times daily. 4 times daily  BETWEEN MEALS AND AT BEDTIME      ipratropium 0.02 % nebulizer solution   Commonly known as: ATROVENT   Take 500 mcg by nebulization every 4 (four) hours as needed.  For wheezing      levofloxacin 750 MG tablet   Commonly known as: LEVAQUIN   Take 1 tablet (750 mg total) by mouth daily. For 6 more days from 01/18/12      levothyroxine 200 MCG tablet   Commonly known as: SYNTHROID, LEVOTHROID   Take 200 mcg by mouth daily. TAKE WITH THE 25 MCG LEVOTHYROXINE      levothyroxine 25 MCG tablet   Commonly known as: SYNTHROID, LEVOTHROID   Take 25 mcg by mouth daily. Take with of levothyroxine      loratadine 10 MG tablet   Commonly known as: CLARITIN   Take 10 mg by mouth daily. Take for 3 weeks      LORazepam 0.5 MG tablet   Commonly known as: ATIVAN   Take 0.5 mg by mouth 2 (two) times daily.      loteprednol 0.5 % ophthalmic suspension   Commonly known as: LOTEMAX   Place 1 drop into both eyes every morning.      Melatonin 2.5-338 MG-MCG Subl   Place 1 tablet under the tongue at bedtime.       metoprolol tartrate 25 MG tablet   Commonly known as: LOPRESSOR   Take 12.5 mg by mouth 2 (two) times daily. Hold for systolic blood pressure <100      pantoprazole 40 MG tablet   Commonly known as: PROTONIX   Take 40 mg by mouth daily.      phosphate enema   Commonly known as: FLEET   Place 1 enema rectally every other day as needed. For constipation      pregabalin 75 MG capsule   Commonly known as: LYRICA   Take 75 mg by mouth 2 (two) times daily.      senna-docusate 8.6-50 MG per tablet   Commonly known as: Senokot-S   Take 2 tablets by mouth 2 (two) times daily.      sucralfate 1 G tablet   Commonly known as: CARAFATE   Take 1 g by mouth 4 (four) times daily.      traMADol 50 MG tablet   Commonly known as: ULTRAM   Take 50 mg by mouth every 6 (six) hours as needed. USED FOR PAIN      white petrolatum Gel   Commonly known as: VASELINE   Apply 1 application topically as needed. APPLY TO AFFECTED AREAS ( RIGHT INNER BUTTOCK) AT BEDTIME AFTER WASHING)      zolpidem 10 MG tablet   Commonly known as: AMBIEN   Take 10 mg by mouth at bedtime.             BRIEF HPI:  See H&P, Labs, Consult and Test reports for all details in brief, 71 year-old male with known history of hypertension, COPD on home O2, OSA was brought to the ER patient had a fall. Patient states she's been feeling weak after supper. He he walked back to his room and felt dizzy and weak. When he tried to seat himself on a chair he missed and fell. But he did not lose consciousness. The nursing home staff found him to be weak and was transferred him to the ER. In the ER patient was found to be hypoxic and subsequently underwent CT angiogram of the chest which showed pneumonic process.   CONSULTATIONS:   None  PERTINENT RADIOLOGIC STUDIES: Dg Chest 2 View  01/16/2012  *RADIOLOGY REPORT*  Clinical Data: Chest pain.  Altered mental status.  CHEST - 2 VIEW  Comparison: 12/05/2011.  Findings: Stable  enlarged cardiac silhouette and mildly elevated left hemidiaphragm.  Clear lungs.  Bilateral shoulder degenerative changes.  IMPRESSION: Stable cardiomegaly.  No acute abnormality.  Original Report Authenticated By: Darrol Angel, M.D.   Dg Ankle 2 Views Right  01/17/2012  *RADIOLOGY REPORT*  Clinical Data: Swelling, no injury.  Diabetes  RIGHT ANKLE - 2 VIEW  Comparison: 04/18/2010  Findings: Negative for acute fracture.  Small ossicles inferior to the medial malleolus compatible with chronic injury.  Spurring of the calcaneus at the plantar fascia and Achilles tendon insertion sites.  Mild degenerative change in the mid foot.  Arterial calcification.  No focal bony lesion.  IMPRESSION: Degenerative changes.  No acute abnormality.  Original Report Authenticated By: Camelia Phenes, M.D.   Ct Angio Chest W/cm &/or Wo Cm  01/16/2012  *RADIOLOGY REPORT*  Clinical Data: Status post fall; cough, fever, shortness of breath, chest pain and altered mental status.  CT ANGIOGRAPHY CHEST  Technique:  Multidetector CT imaging of the chest using the standard protocol during bolus administration of intravenous contrast. Multiplanar reconstructed images including MIPs were obtained and reviewed to evaluate the vascular anatomy.  Contrast: OMNIPAQUE IOHEXOL 350 MG/ML IV SOLN  Comparison: Chest radiograph performed earlier today at 06:42 p.m., and CT of the chest performed 07/12/2011  Findings: There is no evidence of central pulmonary embolus. Evaluation for pulmonary embolus is suboptimal due to limitations in the timing of the contrast bolus.  There is partial consolidation of the left lower lobe, and mild patchy opacity within the inferior aspect of the left upper lobe and the right lower lobe, raising concern for pneumonia.  Elevation of the left hemidiaphragm is noted.  Mild scattered nodular densities are noted in the periphery of both lungs; these may reflect remote granulomatous disease.  There is no evidence  of pleural effusion or pneumothorax.  The ascending thoracic aorta is borderline normal in size.  Diffuse coronary artery calcifications are seen.  No mediastinal lymphadenopathy is appreciated.  No pericardial effusion is identified.  No axillary lymphadenopathy is seen.  The thyroid gland appears grossly absent.  The visualized portions of the liver and spleen are unremarkable. The visualized portions of the pancreas, gallbladder, stomach, adrenal glands and kidneys are within normal limits.  No acute osseous abnormalities are seen.  Anterior bridging osteophytes are noted along the lower cervical and upper thoracic spine.  IMPRESSION:  1.  No evidence of central pulmonary embolus. 2.  Partial consolidation of the left lower lobe, and mild patchy opacity within the inferior left upper lobe and right lower lobe, raising concern for pneumonia. 3.  Diffuse coronary artery calcifications seen. 4.  Mild scattered nodular densities in the periphery of both lungs may reflect remote granulomatous disease.  Original Report Authenticated By: Tonia Ghent, M.D.     PERTINENT LAB RESULTS: CBC:  Basename 01/17/12 0209 01/16/12 1807  WBC 4.1 5.4  HGB 11.7* 13.6  HCT 35.8* 40.3  PLT 157 186   CMET CMP     Component Value Date/Time   NA 140 01/17/2012 0209   K 3.3* 01/17/2012 0209   CL 104 01/17/2012 0209   CO2 26 01/17/2012 0209   GLUCOSE 102* 01/17/2012 0209   BUN 21 01/17/2012 0209   CREATININE 1.26 01/17/2012 0209   CALCIUM 6.7* 01/17/2012 0209   CALCIUM 9.5 03/19/2011 1344   PROT 6.0 01/17/2012 0209   ALBUMIN 2.9* 01/17/2012 0209   AST 22 01/17/2012 0209   ALT 13  01/17/2012 0209   ALKPHOS 82 01/17/2012 0209   BILITOT 0.4 01/17/2012 0209   GFRNONAA 55* 01/17/2012 0209   GFRAA 64* 01/17/2012 0209    GFR Estimated Creatinine Clearance: 66.4 ml/min (by C-G formula based on Cr of 1.26). No results found for this basename: LIPASE:2,AMYLASE:2 in the last 72 hours  Basename 01/17/12 0124  CKTOTAL 53  CKMB  1.2  CKMBINDEX --  TROPONINI <0.30   No components found with this basename: POCBNP:3 No results found for this basename: DDIMER:2 in the last 72 hours No results found for this basename: HGBA1C:2 in the last 72 hours No results found for this basename: CHOL:2,HDL:2,LDLCALC:2,TRIG:2,CHOLHDL:2,LDLDIRECT:2 in the last 72 hours  Basename 01/17/12 0209  TSH 6.805*  T4TOTAL --  T3FREE --  THYROIDAB --   No results found for this basename: VITAMINB12:2,FOLATE:2,FERRITIN:2,TIBC:2,IRON:2,RETICCTPCT:2 in the last 72 hours Coags: No results found for this basename: PT:2,INR:2 in the last 72 hours Microbiology: Recent Results (from the past 240 hour(s))  MRSA PCR SCREENING     Status: Normal   Collection Time   01/17/12 12:19 AM      Component Value Range Status Comment   MRSA by PCR NEGATIVE  NEGATIVE  Final      BRIEF HOSPITAL COURSE:   Principal Problem:  *Healthcare-associated pneumonia Patient presented to the ED with generalized weakness and a fall,he was then found to be slightly hypoxic and underwent a CT Angiogram of the Chest-which did not show a Pulmonary Embolism, but showed a pneumonic process. He subsequently was admitted for empiric antibiotics, he has remained afebrile and has no leukocytosis. He does not have a toxic appearance, if he continues to do well, he will be discharged on levaquin therapy for another 6 days. Physical therapy services has seen the patient, and have recommended HHPT services at the ALF which has been ordered.  COPD with chronic respiratory failure -lungs clear  -on chronic oxygen therapy as outpatient  -as needed albuterol and atrovent nebulizer  CAD  -Stable  -continue with ASA/Plavix/Metoprolol   HTN  -stable  -continue with metoprolol   Hypothyroidism  -continue with levothyroxine  BPH  -Continue with Flomax  H/O renal cell carcinoma  -outpatient follow up   OSA  -resume CPAP at discharge  TODAY-DAY OF DISCHARGE:  Subjective:    Lonnell Steier today has no headache,no chest abdominal pain,no new weakness tingling or numbness, feels much better wants to go home today, he feels better, had a good session with PT yesterday.  Objective:   Blood pressure 108/70, pulse 69, temperature 97.6 F (36.4 C), temperature source Oral, resp. rate 20, height 6' 4.8" (1.951 m), weight 95.074 kg (209 lb 9.6 oz), SpO2 99.00%.  Intake/Output Summary (Last 24 hours) at 01/17/12 1427 Last data filed at 01/17/12 1300  Gross per 24 hour  Intake 981.67 ml  Output    400 ml  Net 581.67 ml    Exam Awake Alert, Oriented *3, No new F.N deficits, Normal affect Duck.AT,PERRAL Supple Neck,No JVD, No cervical lymphadenopathy appriciated.  Symmetrical Chest wall movement, Good air movement bilaterally, CTAB RRR,No Gallops,Rubs or new Murmurs, No Parasternal Heave +ve B.Sounds, Abd Soft, Non tender, No organomegaly appriciated, No rebound -guarding or rigidity. No Cyanosis, Clubbing or edema, No new Rash or bruise  DISPOSITION: Back to ALF  DISCHARGE INSTRUCTIONS:    Follow-up Information    Schedule an appointment as soon as possible for a visit with PATEL,BHAIRAVI, MD. (3-5 days)         Total  Time spent on discharge equals 45 minutes.  SignedJeoffrey Massed 01/17/2012 2:27 PM

## 2012-01-17 NOTE — Progress Notes (Signed)
PATIENT DETAILS Name: [name] [name] Age: 71 y.o. Sex: male Date of Birth: [birth year] Admit Date: 01/16/2012 WGN:FAOZH,[id number], MD, MD  Subjective: Still feels weak.  Objective: Vital signs in last 24 hours: Filed Vitals:   01/17/12 0417 01/17/12 1000 01/17/12 1002 01/17/12 1004  BP: 125/70 124/71 131/77 116/72  Pulse: 81 76 85 101  Temp: 98 F (36.7 C)     TempSrc: Oral     Resp: 22     Height:      Weight:      SpO2: 99%       Weight change:   Body mass index is 24.98 kg/(m^2).  Intake/Output from previous day:  Intake/Output Summary (Last 24 hours) at 01/17/12 1328 Last data filed at 01/17/12 1100  Gross per 24 hour  Intake 861.67 ml  Output    400 ml  Net 461.67 ml    PHYSICAL EXAM: Gen Exam: Awake and alert Neck: Supple, No JVD.   Chest: B/L Clear.   CVS: S1 S2 Regular, no murmurs.  Abdomen: soft, BS +, non tender, non distended.  Extremities: no edema, lower extremities warm to touch. Neurologic: Non Focal.  Skin: No Rash.   Wounds: N/A.    CONSULTS:  None  LAB RESULTS: CBC  Lab 01/17/12 0209 01/16/12 1807  WBC 4.1 5.4  HGB 11.7* 13.6  HCT 35.8* 40.3  PLT 157 186  MCV 96.8 95.7  MCH 31.6 32.3  MCHC 32.7 33.7  RDW 14.0 14.1  LYMPHSABS -- 0.7  MONOABS -- 0.3  EOSABS -- 0.0  BASOSABS -- 0.0  BANDABS -- --    Chemistries   Lab 01/17/12 0209 01/16/12 1807  NA 140 139  K 3.3* 3.4*  CL 104 97  CO2 26 29  GLUCOSE 102* 100*  BUN 21 24*  CREATININE 1.26 1.27  CALCIUM 6.7* 7.7*  MG -- --    GFR Estimated Creatinine Clearance: 66.4 ml/min (by C-G formula based on Cr of 1.26).  Coagulation profile No results found for this basename: INR:5,PROTIME:5 in the last 168 hours  Cardiac Enzymes  Lab 01/17/12 0124  CKMB 1.2  TROPONINI <0.30  MYOGLOBIN --    No components found with this basename: POCBNP:3 No results found for this basename: DDIMER:2 in the last 72 hours No results found for this basename: HGBA1C:2 in the last 72  hours No results found for this basename: CHOL:2,HDL:2,LDLCALC:2,TRIG:2,CHOLHDL:2,LDLDIRECT:2 in the last 72 hours  Basename 01/17/12 0209  TSH 6.805*  T4TOTAL --  T3FREE --  THYROIDAB --   No results found for this basename: VITAMINB12:2,FOLATE:2,FERRITIN:2,TIBC:2,IRON:2,RETICCTPCT:2 in the last 72 hours No results found for this basename: LIPASE:2,AMYLASE:2 in the last 72 hours  Urine Studies No results found for this basename: UACOL:2,UAPR:2,USPG:2,UPH:2,UTP:2,UGL:2,UKET:2,UBIL:2,UHGB:2,UNIT:2,UROB:2,ULEU:2,UEPI:2,UWBC:2,URBC:2,UBAC:2,CAST:2,CRYS:2,UCOM:2,BILUA:2 in the last 72 hours  MICROBIOLOGY: Recent Results (from the past 240 hour(s))  MRSA PCR SCREENING     Status: Normal   Collection Time   01/17/12 12:19 AM      Component Value Range Status Comment   MRSA by PCR NEGATIVE  NEGATIVE  Final     RADIOLOGY STUDIES/RESULTS: Dg Chest 2 View  01/16/2012  *RADIOLOGY REPORT*  Clinical Data: Chest pain.  Altered mental status.  CHEST - 2 VIEW  Comparison: 12/05/2011.  Findings: Stable enlarged cardiac silhouette and mildly elevated left hemidiaphragm.  Clear lungs.  Bilateral shoulder degenerative changes.  IMPRESSION: Stable cardiomegaly.  No acute abnormality.  Original Report Authenticated By: Darrol Angel, M.D.   Dg Ankle 2 Views Right  01/17/2012  *RADIOLOGY  REPORT*  Clinical Data: Swelling, no injury.  Diabetes  RIGHT ANKLE - 2 VIEW  Comparison: 04/18/2010  Findings: Negative for acute fracture.  Small ossicles inferior to the medial malleolus compatible with chronic injury.  Spurring of the calcaneus at the plantar fascia and Achilles tendon insertion sites.  Mild degenerative change in the mid foot.  Arterial calcification.  No focal bony lesion.  IMPRESSION: Degenerative changes.  No acute abnormality.  Original Report Authenticated By: Camelia Phenes, M.D.   Ct Angio Chest W/cm &/or Wo Cm  01/16/2012  *RADIOLOGY REPORT*  Clinical Data: Status post fall; cough, fever,  shortness of breath, chest pain and altered mental status.  CT ANGIOGRAPHY CHEST  Technique:  Multidetector CT imaging of the chest using the standard protocol during bolus administration of intravenous contrast. Multiplanar reconstructed images including MIPs were obtained and reviewed to evaluate the vascular anatomy.  Contrast: OMNIPAQUE IOHEXOL 350 MG/ML IV SOLN  Comparison: Chest radiograph performed earlier today at 06:42 p.m., and CT of the chest performed 07/12/2011  Findings: There is no evidence of central pulmonary embolus. Evaluation for pulmonary embolus is suboptimal due to limitations in the timing of the contrast bolus.  There is partial consolidation of the left lower lobe, and mild patchy opacity within the inferior aspect of the left upper lobe and the right lower lobe, raising concern for pneumonia.  Elevation of the left hemidiaphragm is noted.  Mild scattered nodular densities are noted in the periphery of both lungs; these may reflect remote granulomatous disease.  There is no evidence of pleural effusion or pneumothorax.  The ascending thoracic aorta is borderline normal in size.  Diffuse coronary artery calcifications are seen.  No mediastinal lymphadenopathy is appreciated.  No pericardial effusion is identified.  No axillary lymphadenopathy is seen.  The thyroid gland appears grossly absent.  The visualized portions of the liver and spleen are unremarkable. The visualized portions of the pancreas, gallbladder, stomach, adrenal glands and kidneys are within normal limits.  No acute osseous abnormalities are seen.  Anterior bridging osteophytes are noted along the lower cervical and upper thoracic spine.  IMPRESSION:  1.  No evidence of central pulmonary embolus. 2.  Partial consolidation of the left lower lobe, and mild patchy opacity within the inferior left upper lobe and right lower lobe, raising concern for pneumonia. 3.  Diffuse coronary artery calcifications seen. 4.  Mild  scattered nodular densities in the periphery of both lungs may reflect remote granulomatous disease.  Original Report Authenticated By: Tonia Ghent, M.D.    MEDICATIONS: Scheduled Meds:   . aspirin  325 mg Oral QHS  . calcitRIOL  0.5 mcg Oral Daily  . ceFEPime (MAXIPIME) IV  1 g Intravenous Q12H  . clopidogrel  75 mg Oral Q breakfast  . cycloSPORINE  1 drop Both Eyes BID  . enoxaparin  40 mg Subcutaneous Q24H  . fluticasone  2 spray Each Nare Daily  . influenza  inactive virus vaccine  0.5 mL Intramuscular Tomorrow-1000  . levofloxacin (LEVAQUIN) IV  750 mg Intravenous Q24H  . levothyroxine  225 mcg Oral QAC breakfast  . loratadine  10 mg Oral Daily  . LORazepam  0.5 mg Oral BID  . loteprednol  1 drop Both Eyes Daily  . metoprolol tartrate  12.5 mg Oral BID  . pantoprazole  40 mg Oral Q1200  . piperacillin-tazobactam (ZOSYN)  IV  3.375 g Intravenous Once  . pneumococcal 23 valent vaccine  0.5 mL Intramuscular Tomorrow-1000  . pregabalin  75 mg Oral BID  . senna-docusate  2 tablet Oral BID  . simethicone  80 mg Oral TID PC & HS  . sodium chloride  1,000 mL Intravenous Once  . sodium chloride  3 mL Intravenous Q12H  . sucralfate  1 g Oral TID AC & HS  . Tamsulosin HCl  0.4 mg Oral QHS  . vancomycin  1,250 mg Intravenous Q12H  . vancomycin  1,000 mg Intravenous Once  . zolpidem  10 mg Oral QHS  . DISCONTD: levothyroxine  200 mcg Oral QAC breakfast  . DISCONTD: levothyroxine  25 mcg Oral Daily   Continuous Infusions:   . sodium chloride 100 mL/hr at 01/17/12 0123   PRN Meds:.acetaminophen, acetaminophen, albuterol, bisacodyl, iohexol, ipratropium, ondansetron (ZOFRAN) IV, ondansetron, traMADol  Antibiotics: Anti-infectives     Start     Dose/Rate Route Frequency Ordered Stop   01/17/12 1200   vancomycin (VANCOCIN) 1,250 mg in sodium chloride 0.9 % 250 mL IVPB        1,250 mg 166.7 mL/hr over 90 Minutes Intravenous Every 12 hours 01/17/12 0124     01/17/12 0300    levofloxacin (LEVAQUIN) IVPB 750 mg        750 mg 100 mL/hr over 90 Minutes Intravenous Every 24 hours 01/17/12 0124     01/17/12 0200   ceFEPIme (MAXIPIME) 1 g in dextrose 5 % 50 mL IVPB        1 g 100 mL/hr over 30 Minutes Intravenous Every 12 hours 01/17/12 0124     01/16/12 2245   vancomycin (VANCOCIN) IVPB 1000 mg/200 mL premix        1,000 mg 200 mL/hr over 60 Minutes Intravenous  Once 01/16/12 2238 01/17/12 0040   01/16/12 2245  piperacillin-tazobactam (ZOSYN) IVPB 3.375 g       3.375 g 12.5 mL/hr over 240 Minutes Intravenous  Once 01/16/12 2238 01/17/12 0248          Assessment/Plan: Patient Active Hospital Problem List: Healthcare-associated pneumonia -Afebrile, no leukocytosis -continue with empiric antibiotics-Cefepime, Levaquin, and Vancomycin  COPD -lungs clear -on chronic oxygen therapy as outpatient -as needed albuterol and atrovent nebulizer  CAD -Stable -continue with ASA/Plavix/Metoprolol  HTN -stable -continue with metoprolol  Hypothyroidism -continue with levothyroxine  BPH -Continue with Flomax  H/O renal cell carcinoma -outpatient follow up  OSA -CPAP  Disposition: Remain inpatient  DVT Prophylaxis: Prophylactic Lovenox  Code Status: Full Code  Maretta Bees,  MD. 01/17/2012, 1:28 PM

## 2012-01-17 NOTE — H&P (Signed)
[name] [name] is an 71 y.o. male.   PCP - Dr.Pandey [facility]. Chief Complaint: Weakness. HPI: 71 year-old male with known history of hypertension, COPD, OSA was brought to the ER patient had a fall. Patient states she's been feeling weak after supper. He he walked back to his room and felt dizzy and weak. When he tried to seat himself on a chair he missed and fell. But he did not lose consciousness. The nursing home staff found him to be weak and was transferred him to the ER. In the ER patient was found to be hypoxic and subsequently underwent CT angiogram of the chest which showed pneumonic process. Patient otherwise denies chest pain, shortness of breath, cough or phlegm. Denies nausea vomiting abdominal pain any focal deficits headache. He has some left knee and right ankle pain which he states are chronic.  Past Medical History  Diagnosis Date  . Other congenital hamartoses, not elsewhere classified   . Family history of ischemic heart disease   . Family history of malignant neoplasm of gastrointestinal tract   . Allergic rhinitis, cause unspecified   . Other and unspecified hyperlipidemia   . Unspecified essential hypertension   . Obesity hypoventilation syndrome     failed cpap/bipap trial  . Hypothyroidism   . GERD (gastroesophageal reflux disease)   . Arthritis     osteoarthritis  . DEMENTIA     Alzheimer's, senile dementia  . Shortness of breath   . Cancer     renal carcinoma  . Sleep apnea     OXYGEN 24 HR/DAY-NO CPAP  . COPD (chronic obstructive pulmonary disease)     DR. Marcelyn Bruins FOLLOWS PT--PT USING OXYGEN 24 HRS A DAY  . COPD (chronic obstructive pulmonary disease)     PULMONARY CLEARANCE FOR LEFT RENAL CRYOABLATION ON CHART FROM DR. CLANCE -07/26/11  . Coronary artery disease     CARDIAC CLEARANCE  OFFICE NOTE FOR LEFT CRYO ABLATION ON CHART FROM [name] -"RECENT STRESS TEST DID NOT SHOW ISCHEMIA" -"PRESUMED CAD DUE TO CORONARY CALCIFICATIONS"     Past Surgical  History  Procedure Date  . Eye surgery     bilateral cataracts  . Joint replacement     bilateral knee replacements  . Carpal tunnel release     right  . Thyroidectomy   . Cholecystectomy 2009  . Knee arthroplasty     2005 LEFT   AND 2007 RIGHT--[name]  . Tumor removal 09/14/2011    Family History  Problem Relation Age of Onset  . Cancer Other     colon  . Heart disease Other     CAD, MI and Heart Attack   Social History:  reports that he has never smoked. He has never used smokeless tobacco. He reports that he does not drink alcohol or use illicit drugs.  Allergies: No Known Allergies  Medications Prior to Admission  Medication Dose Route Frequency Provider Last Rate Last Dose  . 0.9 %  sodium chloride infusion   Intravenous Continuous Eduard Clos, MD      . acetaminophen (TYLENOL) tablet 650 mg  650 mg Oral Q6H PRN Eduard Clos, MD       Or  . acetaminophen (TYLENOL) suppository 650 mg  650 mg Rectal Q6H PRN Eduard Clos, MD      . aspirin tablet 325 mg  325 mg Oral QHS Eduard Clos, MD      . bisacodyl (DULCOLAX) EC tablet 10 mg  10  mg Oral Daily PRN Eduard Clos, MD      . calcitRIOL (ROCALTROL) capsule 0.5 mcg  0.5 mcg Oral Daily Eduard Clos, MD      . clopidogrel (PLAVIX) tablet 75 mg  75 mg Oral BH-q7a Eduard Clos, MD      . cycloSPORINE (RESTASIS) 0.05 % ophthalmic emulsion 1 drop  1 drop Both Eyes BID Eduard Clos, MD      . enoxaparin (LOVENOX) injection 40 mg  40 mg Subcutaneous Q24H Eduard Clos, MD      . fluticasone (FLONASE) 50 MCG/ACT nasal spray 2 spray  2 spray Each Nare Daily Eduard Clos, MD      . iohexol (OMNIPAQUE) 350 MG/ML injection 100 mL  100 mL Intravenous Once PRN Medication Radiologist, MD   100 mL at 01/16/12 2238  . levothyroxine (SYNTHROID, LEVOTHROID) tablet 200 mcg  200 mcg Oral Daily Eduard Clos, MD      . levothyroxine (SYNTHROID, LEVOTHROID) tablet 25 mcg   25 mcg Oral Daily Eduard Clos, MD      . loratadine (CLARITIN) tablet 10 mg  10 mg Oral Daily Eduard Clos, MD      . LORazepam (ATIVAN) tablet 0.5 mg  0.5 mg Oral BID Eduard Clos, MD      . loteprednol (LOTEMAX) 0.5 % ophthalmic suspension 1 drop  1 drop Both Eyes BH-q7a Eduard Clos, MD      . metoprolol tartrate (LOPRESSOR) tablet 12.5 mg  12.5 mg Oral BID Eduard Clos, MD      . ondansetron [facility] tablet 4 mg  4 mg Oral Q6H PRN Eduard Clos, MD       Or  . ondansetron [facility]) injection 4 mg  4 mg Intravenous Q6H PRN Eduard Clos, MD      . pantoprazole (PROTONIX) EC tablet 40 mg  40 mg Oral Daily Eduard Clos, MD      . piperacillin-tazobactam (ZOSYN) IVPB 3.375 g  3.375 g Intravenous Once Otilio Miu, PA   3.375 g at 01/16/12 2248  . pregabalin (LYRICA) capsule 75 mg  75 mg Oral BID Eduard Clos, MD      . senna-docusate (Senokot-S) tablet 2 tablet  2 tablet Oral BID Eduard Clos, MD      . simethicone [facility]) chewable tablet 80 mg  80 mg Oral QID Eduard Clos, MD      . sodium chloride 0.9 % bolus 1,000 mL  1,000 mL Intravenous Once Otilio Miu, PA   1,000 mL at 01/16/12 1825  . sodium chloride 0.9 % injection 3 mL  3 mL Intravenous Q12H Eduard Clos, MD      . sucralfate (CARAFATE) tablet 1 g  1 g Oral QID Eduard Clos, MD      . Tamsulosin HCl (FLOMAX) capsule 0.4 mg  0.4 mg Oral QHS Eduard Clos, MD      . traMADol [facility]) tablet 50 mg  50 mg Oral Q6H PRN Eduard Clos, MD      . vancomycin (VANCOCIN) IVPB 1000 mg/200 mL premix  1,000 mg Intravenous Once Otilio Miu, PA   1,000 mg at 01/16/12 2340  . zolpidem (AMBIEN) tablet 10 mg  10 mg Oral QHS Eduard Clos, MD       Medications Prior to Admission  Medication Sig Dispense Refill  . acetaminophen (TYLENOL) 325 MG tablet Take 650 mg by mouth  every 6 (six) hours as needed. USED FOR  PAIN/STIFFNESS      . aspirin 325 MG tablet Take 325 mg by mouth at bedtime.       . bisacodyl (DULCOLAX) 5 MG EC tablet Take 10 mg by mouth daily as needed. FOR CONSTIPATION.       . calcitRIOL (ROCALTROL) 0.5 MCG capsule Take 0.5 mcg by mouth daily.       . clopidogrel (PLAVIX) 75 MG tablet Take 75 mg by mouth every morning.       . cycloSPORINE (RESTASIS) 0.05 % ophthalmic emulsion Place 1 drop into both eyes 2 (two) times daily.       Marland Kitchen levothyroxine (SYNTHROID, LEVOTHROID) 200 MCG tablet Take 200 mcg by mouth daily. TAKE WITH THE 25 MCG LEVOTHYROXINE      . levothyroxine (SYNTHROID, LEVOTHROID) 25 MCG tablet Take 25 mcg by mouth daily. Take with of levothyroxine      . loratadine (CLARITIN) 10 MG tablet Take 10 mg by mouth daily. Take for 3 weeks      . LORazepam (ATIVAN) 0.5 MG tablet Take 0.5 mg by mouth 2 (two) times daily.       Marland Kitchen loteprednol (LOTEMAX) 0.5 % ophthalmic suspension Place 1 drop into both eyes every morning.       . Melatonin 2.5-338 MG-MCG SUBL Place 1 tablet under the tongue at bedtime.      . metoprolol tartrate (LOPRESSOR) 25 MG tablet Take 12.5 mg by mouth 2 (two) times daily. Hold for systolic blood pressure <100      . pantoprazole (PROTONIX) 40 MG tablet Take 40 mg by mouth daily.       . pregabalin (LYRICA) 75 MG capsule Take 75 mg by mouth 2 (two) times daily.      Marland Kitchen senna-docusate (SENOKOT-S) 8.6-50 MG per tablet Take 2 tablets by mouth 2 (two) times daily.      . simethicone (GAS-X) 80 MG chewable tablet Chew 80 mg by mouth 4 (four) times daily. 4 times daily BETWEEN MEALS AND AT BEDTIME      . sucralfate (CARAFATE) 1 G tablet Take 1 g by mouth 4 (four) times daily.       . Tamsulosin HCl (FLOMAX) 0.4 MG CAPS Take 0.4 mg by mouth at bedtime. Hold for systolic blood pressure of <100      . traMADol (ULTRAM) 50 MG tablet Take 50 mg by mouth every 6 (six) hours as needed. USED FOR PAIN      . white petrolatum (VASELINE) GEL Apply 1 application topically as  needed. APPLY TO AFFECTED AREAS ( RIGHT INNER BUTTOCK) AT BEDTIME AFTER WASHING)      . zolpidem (AMBIEN) 10 MG tablet Take 10 mg by mouth at bedtime.         Results for orders placed during the hospital encounter of 01/16/12 (from the past 48 hour(s))  CBC     Status: Abnormal   Collection Time   01/16/12  6:07 PM      Component Value Range Comment   WBC 5.4  4.0 - 10.5 (K/uL)    RBC 4.21 (*) 4.22 - 5.81 (MIL/uL)    Hemoglobin 13.6  13.0 - 17.0 (g/dL)    HCT 44.0  34.7 - 42.5 (%)    MCV 95.7  78.0 - 100.0 (fL)    MCH 32.3  26.0 - 34.0 (pg)    MCHC 33.7  30.0 - 36.0 (g/dL)    RDW 95.6  38.7 -  15.5 (%)    Platelets 186  150 - 400 (K/uL)   DIFFERENTIAL     Status: Abnormal   Collection Time   01/16/12  6:07 PM      Component Value Range Comment   Neutrophils Relative 82 (*) 43 - 77 (%)    Neutro Abs 4.5  1.7 - 7.7 (K/uL)    Lymphocytes Relative 12  12 - 46 (%)    Lymphs Abs 0.7  0.7 - 4.0 (K/uL)    Monocytes Relative 5  3 - 12 (%)    Monocytes Absolute 0.3  0.1 - 1.0 (K/uL)    Eosinophils Relative 0  0 - 5 (%)    Eosinophils Absolute 0.0  0.0 - 0.7 (K/uL)    Basophils Relative 0  0 - 1 (%)    Basophils Absolute 0.0  0.0 - 0.1 (K/uL)   BASIC METABOLIC PANEL     Status: Abnormal   Collection Time   01/16/12  6:07 PM      Component Value Range Comment   Sodium 139  135 - 145 (mEq/L)    Potassium 3.4 (*) 3.5 - 5.1 (mEq/L)    Chloride 97  96 - 112 (mEq/L)    CO2 29  19 - 32 (mEq/L)    Glucose, Bld 100 (*) 70 - 99 (mg/dL)    BUN 24 (*) 6 - 23 (mg/dL)    Creatinine, Ser 1.61  0.50 - 1.35 (mg/dL)    Calcium 7.7 (*) 8.4 - 10.5 (mg/dL)    GFR calc non Af Amer 55 (*) >90 (mL/min)    GFR calc Af Amer 63 (*) >90 (mL/min)   POCT I-STAT TROPONIN I     Status: Normal   Collection Time   01/16/12  6:43 PM      Component Value Range Comment   Troponin i, poc 0.00  0.00 - 0.08 (ng/mL)    Comment 3            URINALYSIS, ROUTINE W REFLEX MICROSCOPIC     Status: Abnormal   Collection Time     01/16/12  7:37 PM      Component Value Range Comment   Color, Urine YELLOW  YELLOW     APPearance CLEAR  CLEAR     Specific Gravity, Urine 1.023  1.005 - 1.030     pH 5.5  5.0 - 8.0     Glucose, UA NEGATIVE  NEGATIVE (mg/dL)    Hgb urine dipstick NEGATIVE  NEGATIVE     Bilirubin Urine NEGATIVE  NEGATIVE     Ketones, ur 15 (*) NEGATIVE (mg/dL)    Protein, ur NEGATIVE  NEGATIVE (mg/dL)    Urobilinogen, UA 0.2  0.0 - 1.0 (mg/dL)    Nitrite NEGATIVE  NEGATIVE     Leukocytes, UA SMALL (*) NEGATIVE    URINE MICROSCOPIC-ADD ON     Status: Normal   Collection Time   01/16/12  7:37 PM      Component Value Range Comment   Squamous Epithelial / LPF RARE  RARE     WBC, UA 0-2  <3 (WBC/hpf)    Dg Chest 2 View  01/16/2012  *RADIOLOGY REPORT*  Clinical Data: Chest pain.  Altered mental status.  CHEST - 2 VIEW  Comparison: 12/05/2011.  Findings: Stable enlarged cardiac silhouette and mildly elevated left hemidiaphragm.  Clear lungs.  Bilateral shoulder degenerative changes.  IMPRESSION: Stable cardiomegaly.  No acute abnormality.  Original Report Authenticated By: Darrol Angel, M.D.  Ct Angio Chest W/cm &/or Wo Cm  01/16/2012  *RADIOLOGY REPORT*  Clinical Data: Status post fall; cough, fever, shortness of breath, chest pain and altered mental status.  CT ANGIOGRAPHY CHEST  Technique:  Multidetector CT imaging of the chest using the standard protocol during bolus administration of intravenous contrast. Multiplanar reconstructed images including MIPs were obtained and reviewed to evaluate the vascular anatomy.  Contrast: OMNIPAQUE IOHEXOL 350 MG/ML IV SOLN  Comparison: Chest radiograph performed earlier today at 06:42 p.m., and CT of the chest performed 07/12/2011  Findings: There is no evidence of central pulmonary embolus. Evaluation for pulmonary embolus is suboptimal due to limitations in the timing of the contrast bolus.  There is partial consolidation of the left lower lobe, and mild patchy  opacity within the inferior aspect of the left upper lobe and the right lower lobe, raising concern for pneumonia.  Elevation of the left hemidiaphragm is noted.  Mild scattered nodular densities are noted in the periphery of both lungs; these may reflect remote granulomatous disease.  There is no evidence of pleural effusion or pneumothorax.  The ascending thoracic aorta is borderline normal in size.  Diffuse coronary artery calcifications are seen.  No mediastinal lymphadenopathy is appreciated.  No pericardial effusion is identified.  No axillary lymphadenopathy is seen.  The thyroid gland appears grossly absent.  The visualized portions of the liver and spleen are unremarkable. The visualized portions of the pancreas, gallbladder, stomach, adrenal glands and kidneys are within normal limits.  No acute osseous abnormalities are seen.  Anterior bridging osteophytes are noted along the lower cervical and upper thoracic spine.  IMPRESSION:  1.  No evidence of central pulmonary embolus. 2.  Partial consolidation of the left lower lobe, and mild patchy opacity within the inferior left upper lobe and right lower lobe, raising concern for pneumonia. 3.  Diffuse coronary artery calcifications seen. 4.  Mild scattered nodular densities in the periphery of both lungs may reflect remote granulomatous disease.  Original Report Authenticated By: Tonia Ghent, M.D.    Review of Systems  Constitutional: Positive for malaise/fatigue.  HENT: Negative.   Eyes: Negative.   Respiratory: Negative.   Cardiovascular: Negative.   Gastrointestinal: Negative.   Genitourinary: Negative.   Skin: Negative.   Neurological: Positive for weakness.  Endo/Heme/Allergies: Negative.   Psychiatric/Behavioral: Negative.     Blood pressure 116/71, pulse 87, temperature 97.9 F (36.6 C), temperature source Oral, resp. rate 22, height 6' 4.8" (1.951 m), weight 95.074 kg (209 lb 9.6 oz), SpO2 98.00%. Physical Exam  Constitutional: He  is oriented to person, place, and time. He appears well-developed and well-nourished. No distress.  HENT:  Head: Normocephalic and atraumatic.  Right Ear: External ear normal.  Left Ear: External ear normal.  Nose: Nose normal.  Mouth/Throat: Oropharynx is clear and moist. No oropharyngeal exudate.  Eyes: Conjunctivae are normal. Pupils are equal, round, and reactive to light. Right eye exhibits no discharge. Left eye exhibits no discharge. No scleral icterus.  Neck: Normal range of motion. Neck supple.  Cardiovascular: Normal rate and regular rhythm.   Respiratory: Effort normal and breath sounds normal. No respiratory distress. He has no wheezes. He has no rales.  GI: Soft. Bowel sounds are normal. He exhibits no distension. There is no tenderness. There is no rebound.  Musculoskeletal: Normal range of motion. He exhibits no edema and no tenderness.       Has some pain in the right ankle and left knee.  Neurological: He is alert  and oriented to person, place, and time.       Moves all extremities.  Skin: He is not diaphoretic.       Skin changes in the anterior shin in the right leg.  Psychiatric: His behavior is normal.     Assessment/Plan #1. Healthcare associated pneumonia - patient will be continued on vancomycin, and cefepime and Levaquin. #2. Generalized weakness - this could be from dehydration. Patient's blood pressure in the low normal. We will check a lactic acid level. Gently hydrate and closely observe. #3. History of COPD - presently not wheezing continue nebulizer. #4. History of OSA - continue CPAP. #5. History of hypothyroidism post surgical - check TSH continue Synthroid. #6. History of renal cell carcinoma. #7. Left knee and right ankle pain - since patient had a fall we will check x-rays.  CODE STATUS - full code.  Devora Tortorella N. 01/17/2012, 12:57 AM

## 2012-01-17 NOTE — Progress Notes (Addendum)
CSW updated FL2 and sent d/c summary to [facility]. Plan for d/c 3/30  CSW received referral for pt admitted from ALF, [facility]. Facility can accept Saturday admission only if d/c summary is received today (3/29).  CSW will continue to follow.

## 2012-01-17 NOTE — Progress Notes (Signed)
ANTIBIOTIC CONSULT NOTE - INITIAL  Pharmacy Consult for Vancomycin, Levaquin & Cefepime Indication: health care-associated pneumonia  No Known Allergies  Patient Measurements: Height: 6' 4.8" (195.1 cm) Weight: 209 lb 9.6 oz (95.074 kg) IBW/kg (Calculated) : 88.64   Vital Signs: Temp: 97.9 F (36.6 C) (03/29 0021) Temp src: Oral (03/29 0021) BP: 116/71 mmHg (03/29 0021) Pulse Rate: 87  (03/29 0021)  Labs:  Basename 01/16/12 1807  WBC 5.4  HGB 13.6  PLT 186  LABCREA --  CREATININE 1.27   Estimated Creatinine Clearance: 65.9 ml/min (by C-G formula based on Cr of 1.27).  Medical History: Past Medical History  Diagnosis Date  . Other congenital hamartoses, not elsewhere classified   . Family history of ischemic heart disease   . Family history of malignant neoplasm of gastrointestinal tract   . Allergic rhinitis, cause unspecified   . Other and unspecified hyperlipidemia   . Unspecified essential hypertension   . Obesity hypoventilation syndrome     failed cpap/bipap trial  . Hypothyroidism   . GERD (gastroesophageal reflux disease)   . Arthritis     osteoarthritis  . DEMENTIA     Alzheimer's, senile dementia  . Shortness of breath   . Cancer     renal carcinoma  . Sleep apnea     OXYGEN 24 HR/DAY-NO CPAP  . COPD (chronic obstructive pulmonary disease)     DR. Marcelyn Bruins FOLLOWS PT--PT USING OXYGEN 24 HRS A DAY  . COPD (chronic obstructive pulmonary disease)     PULMONARY CLEARANCE FOR LEFT RENAL CRYOABLATION ON CHART FROM DR. CLANCE -07/26/11  . Coronary artery disease     CARDIAC CLEARANCE  OFFICE NOTE FOR LEFT CRYO ABLATION ON CHART FROM [name] -"RECENT STRESS TEST DID NOT SHOW ISCHEMIA" -"PRESUMED CAD DUE TO CORONARY CALCIFICATIONS"    Assessment:  Vancomycin 1 gram IV given in ED at 2340 on 3/28.  To continue Vancomycin, and begin Levaquin & Maxipime fir HCAP coverage.  Afebrile, WBC 5.4, no culture data.  Goal of Therapy:  Vancomycin trough  level 15-20 mcg/ml Appropriate doses for renal function and infection  Plan:    Will begin Cefepime 1 gram IV q12hrs, Levaquin 750 mg IV q24hrs and Vancomycin 1250 mg IV q12hrs. Will follow up renal function and clinical status. Will consider checking vancomycin trough level if >5 days of therapy needed.  Dennie Fetters, RPh Pager: [phone] 01/17/2012,1:25 AM

## 2012-01-17 NOTE — Progress Notes (Signed)
01/17/2012 Ivis Nicolson SPARKS Case Management Note [phone]  Utilization review completed.  

## 2012-01-17 NOTE — Clinical Documentation Improvement (Signed)
RESPIRATORY FAILURE DOCUMENTATION CLARIFICATION QUERY   THIS DOCUMENT IS NOT A PERMANENT PART OF THE MEDICAL RECORD  TO RESPOND TO THE THIS QUERY, FOLLOW THE INSTRUCTIONS BELOW:  1. If needed, update documentation for the patient's encounter via the notes activity.  2. Access this query again and click edit on the In [employer].  3. After updating, or not, click F2 to complete all highlighted (required) fields concerning your review. Select "additional documentation in the medical record" OR "no additional documentation provided".  4. Click Sign note button.  5. The deficiency will fall out of your In Basket *Please let us know if you are not able to complete this workflow by phone or e-mail (listed below).  Please update your documentation within the medical record to reflect your response to this query.                                                                                     01/17/12  Dear [facility],  In a better effort to capture your patient's severity of illness, reflect appropriate length of stay and utilization of resources, a review of the patient medical record has revealed the following indicators.    Based on your clinical judgment, please clarify and document in a progress note and/or discharge summary the clinical condition associated with the following supporting information:  In responding to this query please exercise your independent judgment.  The fact that a query is asked, does not imply that any particular answer is desired or expected.  Possible Clinical Conditions   _______Acute on Chronic Respiratory Failure  _______Chronic Respiratory Failure  _______Other Condition  _______Cannot Clinically Determine    Supporting Information:  Risk Factors: Hx COPD "On chronic oxygen therapy as outpatient" per notes Hx OSA on CPAP   Signs&Symptoms: Generalized weakness "Has had worse than baseline SOB today" per ED  physician notes "Pt sitting on edge of bed. Mildly dyspneic/tachypneic" per ED notes  "Reports that his O2 saturation dropped from upper 90s to the 70s when he sat up in bed" per ED notes  Radiology: CTA on 3/28 IMPRESSION:  1. No evidence of central pulmonary embolus.  2. Partial consolidation of the left lower lobe, and mild patchy  opacity within the inferior left upper lobe and right lower lobe,  raising concern for pneumonia.  3. Diffuse coronary artery calcifications seen.  4. Mild scattered nodular densities in the periphery of both lungs  may reflect remote granulomatous disease.  Treatment: [place]@2lpm  Atrovent/Albuterol HHN Q4 prn   You may use possible, probable, or suspect with inpatient documentation. possible, probable, suspected diagnoses MUST be documented at the time of discharge  Reviewed: additional documentation in the medical record  Thank You,    Rossie Muskrat RN, BSN Clinical Documentation Specialist Pager:  [phone] [email or web address]  Health Information Management [place]

## 2012-01-17 NOTE — Progress Notes (Signed)
PT has refused CPAP for the night, says he rests better without it. RT will continue to monitor.

## 2012-01-17 NOTE — Evaluation (Signed)
Physical Therapy Evaluation Patient Details Name: [name] [name] MRN: [id number] DOB: 09-May-1941 Today's Date: 01/17/2012  Problem List:  Patient Active Problem List  Diagnoses  . POSTSURGICAL HYPOTHYROIDISM  . HYPOPARATHYROIDISM  . DYSLIPIDEMIA  . HYPOCALCEMIA  . OBESITY HYPOVENTILATION SYNDROME  . HYPERTENSION  . ALLERGIC RHINITIS  . PULMONARY NODULE  . DISORDERS OF DIAPHRAGM  . CHOLELITHIASIS  . OTHER CONGENITAL HAMARTOSES NEC  . Nonspecific (abnormal) findings on radiological and other examination of body structure  . ABNORMAL CHEST XRAY  . Renal mass, left  . OSA (obstructive sleep apnea)  . Healthcare-associated pneumonia    Past Medical History:  Past Medical History  Diagnosis Date  . Other congenital hamartoses, not elsewhere classified   . Family history of ischemic heart disease   . Family history of malignant neoplasm of gastrointestinal tract   . Allergic rhinitis, cause unspecified   . Other and unspecified hyperlipidemia   . Unspecified essential hypertension   . Obesity hypoventilation syndrome     failed cpap/bipap trial  . Hypothyroidism   . GERD (gastroesophageal reflux disease)   . Arthritis     osteoarthritis  . DEMENTIA     Alzheimer's, senile dementia  . Shortness of breath   . Cancer     renal carcinoma  . Sleep apnea     OXYGEN 24 HR/DAY-NO CPAP  . COPD (chronic obstructive pulmonary disease)     DR. Marcelyn Bruins FOLLOWS PT--PT USING OXYGEN 24 HRS A DAY  . COPD (chronic obstructive pulmonary disease)     PULMONARY CLEARANCE FOR LEFT RENAL CRYOABLATION ON CHART FROM DR. CLANCE -07/26/11  . Coronary artery disease     CARDIAC CLEARANCE  OFFICE NOTE FOR LEFT CRYO ABLATION ON CHART FROM [name] -"RECENT STRESS TEST DID NOT SHOW ISCHEMIA" -"PRESUMED CAD DUE TO CORONARY CALCIFICATIONS"    Past Surgical History:  Past Surgical History  Procedure Date  . Eye surgery     bilateral cataracts  . Joint replacement     bilateral knee  replacements  . Carpal tunnel release     right  . Thyroidectomy   . Cholecystectomy 2009  . Knee arthroplasty     2005 LEFT   AND 2007 RIGHT--[name]  . Tumor removal 09/14/2011    PT Assessment/Plan/Recommendation PT Assessment Clinical Impression Statement: Pt adm with fall and found to have PNA.  Pt can return home to ALF from PT standpoint.  HHPT to address balance and falls. PT Recommendation/Assessment: Patient will need skilled PT in the acute care venue PT Problem List: Decreased strength;Decreased balance PT Therapy Diagnosis : Generalized weakness PT Plan PT Frequency: Min 3X/week PT Treatment/Interventions: Gait training;DME instruction;Functional mobility training;Therapeutic activities;Therapeutic exercise;Balance training;Patient/family education PT Recommendation Follow Up Recommendations: Home health PT Equipment Recommended: None recommended by PT PT Goals  Acute Rehab PT Goals PT Goal Formulation: With patient Time For Goal Achievement: 7 days Pt will Ambulate: >150 feet;with modified independence;with least restrictive assistive device PT Goal: Ambulate - Progress: Goal set today Additional Goals Additional Goal #1: Pt will perform dyamic standing balance with supervision. PT Goal: Additional Goal #1 - Progress: Goal set today  PT Evaluation Precautions/Restrictions  Precautions Precautions: Fall Prior Functioning  Home Living Lives With:  (ALF) Receives Help From: Other (Comment) (staff at ALF) Type of Home: Assisted living Home Layout: One level Home Access: Level entry Bathroom Toilet: Handicapped height Home Adaptive Equipment: Environmental consultant - four wheeled Prior Function Level of Independence: Independent with transfers;Requires assistive device for independence;Independent with  gait Vocation: Retired Producer, television/film/video: Awake/alert Overall Cognitive Status: Appears within functional limits for tasks  assessed Sensation/Coordination   Extremity Assessment RLE Strength RLE Overall Strength Comments: grossly 4/5 LLE Strength LLE Overall Strength Comments: grossly 4/5 Mobility (including Balance) Transfers Transfers: Yes Sit to Stand: 6: Modified independent (Device/Increase time);With upper extremity assist;With armrests;From chair/3-in-1 Stand to Sit: 6: Modified independent (Device/Increase time);With upper extremity assist;With armrests;To chair/3-in-1 Stand Pivot Transfers: 5: Supervision Ambulation/Gait Ambulation/Gait: Yes Ambulation/Gait Assistance: 5: Supervision Ambulation Distance (Feet): 200 Feet Assistive device: 4-wheeled walker Gait Pattern: Trunk flexed  Static Standing Balance Static Standing - Balance Support: Bilateral upper extremity supported (on walker) Static Standing - Level of Assistance: 4: Min assist Exercise    End of Session PT - End of Session Equipment Utilized During Treatment: Gait belt Activity Tolerance: Patient tolerated treatment well Patient left: in chair;with call bell in reach Nurse Communication: Mobility status for ambulation General Behavior During Session: [facility] for tasks performed Cognition: [facility] for tasks performed  [facility] 01/17/2012, 12:25 PM  [employer] PT [phone]

## 2012-01-17 NOTE — Progress Notes (Signed)
   CARE MANAGEMENT NOTE 01/17/2012  Patient:  [name] [name], [name] [name]   Account Number:  [id number]  Date Initiated:  01/17/2012  Documentation initiated by:  Donn Pierini  Subjective/Objective Assessment:   Pt admitted with PNA     Action/Plan:   PTA pt lived at [facility]- ALF-  PT eval ordered   Anticipated DC Date:  01/18/2012   Anticipated DC Plan:  ASSISTED LIVING / REST HOME  In-house referral  Clinical Social Worker      DC Planning Services  CM consult      Choice offered to / List presented to:          HH arranged  HH-2 PT      HH agency  [facility] [address]   Status of service:  Completed, signed off Medicare Important Message given?   (If response is "NO", the following Medicare IM given date fields will be blank) Date Medicare IM given:   Date Additional Medicare IM given:    Discharge Disposition:  ASSISTED LIVING  Per UR Regulation:    If discussed at Long Length of Stay Meetings, dates discussed:    Comments:  PCP- Allena Katz.  01/17/12- 1600- Donn Pierini RN, BSN [phone] Order for HH-PT- pt from [facility]- call made to [facility] - have in house therapy- CSW aware to place HH-PT on FL2-  Place to arrange therapy.  01/17/12- 1100- Donn Pierini RN, BSN [phone] Pt from ALF- CSW consulted for discharge needs- PT eval pending- hopeful to return to ALF- CM to follow

## 2012-01-17 NOTE — Progress Notes (Signed)
   CARE MANAGEMENT NOTE 01/17/2012  Patient:  BELFORD, PASCUCCI   Account Number:  [id number]  Date Initiated:  01/17/2012  Documentation initiated by:  Donn Pierini  Subjective/Objective Assessment:   Pt admitted with PNA     Action/Plan:   PTA pt lived at [facility]- ALF-  PT eval ordered   Anticipated DC Date:  01/18/2012   Anticipated DC Plan:  ASSISTED LIVING / REST HOME  In-house referral  Clinical Social Worker      DC Planning Services  CM consult      Choice offered to / List presented to:             Status of service:  In process, will continue to follow Medicare Important Message given?   (If response is "NO", the following Medicare IM given date fields will be blank) Date Medicare IM given:   Date Additional Medicare IM given:    Discharge Disposition:    Per UR Regulation:    If discussed at Long Length of Stay Meetings, dates discussed:    Comments:  PCP- Patel  01/17/12- 1100- Donn Pierini RN, BSN [phone] Pt from ALF- CSW consulted for discharge needs- PT eval pending- hopeful to return to ALF- CM to follow

## 2012-01-17 NOTE — ED Provider Notes (Signed)
Medical screening examination/treatment/procedure(s) were conducted as a shared visit with non-physician practitioner(s) and myself.  I personally evaluated the patient during the encounter 71 year old man from [facility] had weakness, altered LOC.  Noted to desaturate into the 70's in the ED.  CT angio of chest shows LLL pneumonia.  RX for HCAP, admit.     Carleene Cooper III, MD 01/17/12 [phone]

## 2012-01-18 MED ORDER — TRAMADOL HCL 50 MG PO TABS: 50.0000 mg | ORAL_TABLET | Freq: Four times a day (QID) | ORAL | Status: DC | PRN

## 2012-01-18 MED ORDER — ZOLPIDEM TARTRATE 10 MG PO TABS: 10.0000 mg | ORAL_TABLET | Freq: Every day | ORAL | Status: DC

## 2012-01-18 NOTE — Progress Notes (Signed)
PATIENT DETAILS Name: [name] [name] Age: 71 y.o. Sex: male Date of Birth: 1941/10/07 Admit Date: 01/16/2012 OZH:YQMVH,QIONGEXB, MD, MD  Subjective: Better-no major events overnight.Remains afebrile  Objective: Vital signs in last 24 hours: Filed Vitals:   01/17/12 1004 01/17/12 1406 01/17/12 2135 01/18/12 0518  BP: 116/72 108/70 113/57 100/62  Pulse: 101 69 71 64  Temp:  97.6 F (36.4 C) 97.9 F (36.6 C) 98.1 F (36.7 C)  TempSrc:  Oral Oral Oral  Resp:  20 20 18   Height:      Weight:      SpO2:  99% 100% 100%    Weight change:   Body mass index is 24.98 kg/(m^2).  Intake/Output from previous day:  Intake/Output Summary (Last 24 hours) at 01/18/12 0853 Last data filed at 01/18/12 0700  Gross per 24 hour  Intake    540 ml  Output    400 ml  Net    140 ml    PHYSICAL EXAM: Gen Exam: Awake and alert Neck: Supple, No JVD.   Chest: B/L Clear.   CVS: S1 S2 Regular, no murmurs.  Abdomen: soft, BS +, non tender, non distended.  Extremities: no edema, lower extremities warm to touch.chronic skin changes Neurologic: Non Focal.  Skin: No Rash.   Wounds: N/A.    CONSULTS:  None  LAB RESULTS: CBC  Lab 01/17/12 0209 01/16/12 1807  WBC 4.1 5.4  HGB 11.7* 13.6  HCT 35.8* 40.3  PLT 157 186  MCV 96.8 95.7  MCH 31.6 32.3  MCHC 32.7 33.7  RDW 14.0 14.1  LYMPHSABS -- 0.7  MONOABS -- 0.3  EOSABS -- 0.0  BASOSABS -- 0.0  BANDABS -- --    Chemistries   Lab 01/17/12 0209 01/16/12 1807  NA 140 139  K 3.3* 3.4*  CL 104 97  CO2 26 29  GLUCOSE 102* 100*  BUN 21 24*  CREATININE 1.26 1.27  CALCIUM 6.7* 7.7*  MG -- --    GFR Estimated Creatinine Clearance: 66.4 ml/min (by C-G formula based on Cr of 1.26).  Coagulation profile No results found for this basename: INR:5,PROTIME:5 in the last 168 hours  Cardiac Enzymes  Lab 01/17/12 0124  CKMB 1.2  TROPONINI <0.30  MYOGLOBIN --    No components found with this basename: POCBNP:3 No results found  for this basename: DDIMER:2 in the last 72 hours No results found for this basename: HGBA1C:2 in the last 72 hours No results found for this basename: CHOL:2,HDL:2,LDLCALC:2,TRIG:2,CHOLHDL:2,LDLDIRECT:2 in the last 72 hours  Basename 01/17/12 0209  TSH 6.805*  T4TOTAL --  T3FREE --  THYROIDAB --   No results found for this basename: VITAMINB12:2,FOLATE:2,FERRITIN:2,TIBC:2,IRON:2,RETICCTPCT:2 in the last 72 hours No results found for this basename: LIPASE:2,AMYLASE:2 in the last 72 hours  Urine Studies No results found for this basename: UACOL:2,UAPR:2,USPG:2,UPH:2,UTP:2,UGL:2,UKET:2,UBIL:2,UHGB:2,UNIT:2,UROB:2,ULEU:2,UEPI:2,UWBC:2,URBC:2,UBAC:2,CAST:2,CRYS:2,UCOM:2,BILUA:2 in the last 72 hours  MICROBIOLOGY: Recent Results (from the past 240 hour(s))  MRSA PCR SCREENING     Status: Normal   Collection Time   01/17/12 12:19 AM      Component Value Range Status Comment   MRSA by PCR NEGATIVE  NEGATIVE  Final     RADIOLOGY STUDIES/RESULTS: Dg Chest 2 View  01/16/2012  *RADIOLOGY REPORT*  Clinical Data: Chest pain.  Altered mental status.  CHEST - 2 VIEW  Comparison: 12/05/2011.  Findings: Stable enlarged cardiac silhouette and mildly elevated left hemidiaphragm.  Clear lungs.  Bilateral shoulder degenerative changes.  IMPRESSION: Stable cardiomegaly.  No acute abnormality.  Original Report Authenticated  By: Darrol Angel, M.D.   Dg Ankle 2 Views Right  01/17/2012  *RADIOLOGY REPORT*  Clinical Data: Swelling, no injury.  Diabetes  RIGHT ANKLE - 2 VIEW  Comparison: 04/18/2010  Findings: Negative for acute fracture.  Small ossicles inferior to the medial malleolus compatible with chronic injury.  Spurring of the calcaneus at the plantar fascia and Achilles tendon insertion sites.  Mild degenerative change in the mid foot.  Arterial calcification.  No focal bony lesion.  IMPRESSION: Degenerative changes.  No acute abnormality.  Original Report Authenticated By: Camelia Phenes, M.D.   Ct  Angio Chest W/cm &/or Wo Cm  01/16/2012  *RADIOLOGY REPORT*  Clinical Data: Status post fall; cough, fever, shortness of breath, chest pain and altered mental status.  CT ANGIOGRAPHY CHEST  Technique:  Multidetector CT imaging of the chest using the standard protocol during bolus administration of intravenous contrast. Multiplanar reconstructed images including MIPs were obtained and reviewed to evaluate the vascular anatomy.  Contrast: OMNIPAQUE IOHEXOL 350 MG/ML IV SOLN  Comparison: Chest radiograph performed earlier today at 06:42 p.m., and CT of the chest performed 07/12/2011  Findings: There is no evidence of central pulmonary embolus. Evaluation for pulmonary embolus is suboptimal due to limitations in the timing of the contrast bolus.  There is partial consolidation of the left lower lobe, and mild patchy opacity within the inferior aspect of the left upper lobe and the right lower lobe, raising concern for pneumonia.  Elevation of the left hemidiaphragm is noted.  Mild scattered nodular densities are noted in the periphery of both lungs; these may reflect remote granulomatous disease.  There is no evidence of pleural effusion or pneumothorax.  The ascending thoracic aorta is borderline normal in size.  Diffuse coronary artery calcifications are seen.  No mediastinal lymphadenopathy is appreciated.  No pericardial effusion is identified.  No axillary lymphadenopathy is seen.  The thyroid gland appears grossly absent.  The visualized portions of the liver and spleen are unremarkable. The visualized portions of the pancreas, gallbladder, stomach, adrenal glands and kidneys are within normal limits.  No acute osseous abnormalities are seen.  Anterior bridging osteophytes are noted along the lower cervical and upper thoracic spine.  IMPRESSION:  1.  No evidence of central pulmonary embolus. 2.  Partial consolidation of the left lower lobe, and mild patchy opacity within the inferior left upper lobe and  right lower lobe, raising concern for pneumonia. 3.  Diffuse coronary artery calcifications seen. 4.  Mild scattered nodular densities in the periphery of both lungs may reflect remote granulomatous disease.  Original Report Authenticated By: Tonia Ghent, M.D.    MEDICATIONS: Scheduled Meds:    . aspirin  325 mg Oral QHS  . calcitRIOL  0.5 mcg Oral Daily  . ceFEPime (MAXIPIME) IV  1 g Intravenous Q12H  . clopidogrel  75 mg Oral Q breakfast  . cycloSPORINE  1 drop Both Eyes BID  . enoxaparin  40 mg Subcutaneous Q24H  . fluticasone  2 spray Each Nare Daily  . influenza  inactive virus vaccine  0.5 mL Intramuscular Tomorrow-1000  . levofloxacin (LEVAQUIN) IV  750 mg Intravenous Q24H  . levothyroxine  225 mcg Oral QAC breakfast  . loratadine  10 mg Oral Daily  . LORazepam  0.5 mg Oral BID  . loteprednol  1 drop Both Eyes Daily  . metoprolol tartrate  12.5 mg Oral BID  . pantoprazole  40 mg Oral Q1200  . pneumococcal 23 valent vaccine  0.5  mL Intramuscular Tomorrow-1000  . pregabalin  75 mg Oral BID  . senna-docusate  2 tablet Oral BID  . simethicone  80 mg Oral TID PC & HS  . sodium chloride  3 mL Intravenous Q12H  . sucralfate  1 g Oral TID AC & HS  . Tamsulosin HCl  0.4 mg Oral QHS  . vancomycin  1,250 mg Intravenous Q12H  . zolpidem  10 mg Oral QHS   Continuous Infusions:    . sodium chloride 20 mL/hr (01/18/12 0438)   PRN Meds:.acetaminophen, acetaminophen, albuterol, bisacodyl, ipratropium, ondansetron (ZOFRAN) IV, ondansetron, traMADol  Antibiotics: Anti-infectives     Start     Dose/Rate Route Frequency Ordered Stop   01/17/12 1200   vancomycin (VANCOCIN) 1,250 mg in sodium chloride 0.9 % 250 mL IVPB        1,250 mg 166.7 mL/hr over 90 Minutes Intravenous Every 12 hours 01/17/12 0124     01/17/12 0300   levofloxacin (LEVAQUIN) IVPB 750 mg        750 mg 100 mL/hr over 90 Minutes Intravenous Every 24 hours 01/17/12 0124     01/17/12 0200   ceFEPIme (MAXIPIME) 1 g  in dextrose 5 % 50 mL IVPB        1 g 100 mL/hr over 30 Minutes Intravenous Every 12 hours 01/17/12 0124     01/17/12 0000   levofloxacin (LEVAQUIN) 750 MG tablet        750 mg Oral Daily 01/17/12 1425 01/27/12 2359   01/16/12 2245   vancomycin (VANCOCIN) IVPB 1000 mg/200 mL premix        1,000 mg 200 mL/hr over 60 Minutes Intravenous  Once 01/16/12 2238 01/17/12 0040   01/16/12 2245   piperacillin-tazobactam (ZOSYN) IVPB 3.375 g        3.375 g 12.5 mL/hr over 240 Minutes Intravenous  Once 01/16/12 2238 01/17/12 0248          Assessment/Plan: Patient Active Hospital Problem List: Healthcare-associated pneumonia -Afebrile, no leukocytosis -continue with empiric antibiotics-Cefepime, Levaquin, and Vancomycin while inpatient-will transition to oral levaquin on discharge  COPD -lungs clear -on chronic oxygen therapy as outpatient -as needed albuterol and atrovent nebulizer  CAD -Stable -continue with ASA/Plavix/Metoprolol  HTN -stable -continue with metoprolol  Hypothyroidism -continue with levothyroxine  BPH -Continue with Flomax  H/O renal cell carcinoma -outpatient follow up  OSA -CPAP-but refused last night  Disposition: Back to ALF today  DVT Prophylaxis: Prophylactic Lovenox  Code Status: Full Code  Maretta Bees,  MD. 01/18/2012, 8:53 AM

## 2012-01-18 NOTE — Progress Notes (Signed)
Nsg Discharge Note  Admit Date:  01/16/2012 Discharge date: 01/18/2012   [name] [name] to be D/C'd Nursing Home per MD order.  AVS completed.  Copy for chart, and copy for facility.   Discharge Medication:  [name] [name]  Home Medication Instructions [id number]   Printed on:01/18/12 1432  Medication Information                    aspirin 325 MG tablet Take 325 mg by mouth at bedtime.            LORazepam (ATIVAN) 0.5 MG tablet Take 0.5 mg by mouth 2 (two) times daily.            metoprolol tartrate (LOPRESSOR) 25 MG tablet Take 12.5 mg by mouth 2 (two) times daily. Hold for systolic blood pressure <100           clopidogrel (PLAVIX) 75 MG tablet Take 75 mg by mouth every morning.            calcitRIOL (ROCALTROL) 0.5 MCG capsule Take 0.5 mcg by mouth daily.            simethicone (GAS-X) 80 MG chewable tablet Chew 80 mg by mouth 4 (four) times daily. 4 times daily BETWEEN MEALS AND AT BEDTIME           pantoprazole (PROTONIX) 40 MG tablet Take 40 mg by mouth daily.            cycloSPORINE (RESTASIS) 0.05 % ophthalmic emulsion Place 1 drop into both eyes 2 (two) times daily.            sucralfate (CARAFATE) 1 G tablet Take 1 g by mouth 4 (four) times daily.            loteprednol (LOTEMAX) 0.5 % ophthalmic suspension Place 1 drop into both eyes every morning.            Tamsulosin HCl (FLOMAX) 0.4 MG CAPS Take 0.4 mg by mouth at bedtime. Hold for systolic blood pressure of <100           pregabalin (LYRICA) 75 MG capsule Take 75 mg by mouth 2 (two) times daily.           levothyroxine (SYNTHROID, LEVOTHROID) 200 MCG tablet Take 200 mcg by mouth daily. TAKE WITH THE 25 MCG LEVOTHYROXINE           levothyroxine (SYNTHROID, LEVOTHROID) 25 MCG tablet Take 25 mcg by mouth daily. Take with of levothyroxine           senna-docusate (SENOKOT-S) 8.6-50 MG per tablet Take 2 tablets by mouth 2 (two) times daily.           bisacodyl (DULCOLAX) 5 MG EC  tablet Take 10 mg by mouth daily as needed. FOR CONSTIPATION.            acetaminophen (TYLENOL) 325 MG tablet Take 650 mg by mouth every 6 (six) hours as needed. USED FOR PAIN/STIFFNESS           white petrolatum (VASELINE) GEL Apply 1 application topically as needed. APPLY TO AFFECTED AREAS ( RIGHT INNER BUTTOCK) AT BEDTIME AFTER WASHING)           Melatonin 2.5-338 MG-MCG SUBL Place 1 tablet under the tongue at bedtime.           loratadine (CLARITIN) 10 MG tablet Take 10 mg by mouth daily. Take for 3 weeks  fluticasone (FLONASE) 50 MCG/ACT nasal spray Place 2 sprays into the nose daily.           calcium-vitamin D (OSCAL WITH D) 500-200 MG-UNIT per tablet Take 1 tablet by mouth 2 (two) times daily.            ipratropium (ATROVENT) 0.02 % nebulizer solution Take 500 mcg by nebulization every 4 (four) hours as needed. For wheezing           phosphate (FLEET) enema Place 1 enema rectally every other day as needed. For constipation           levofloxacin (LEVAQUIN) 750 MG tablet Take 1 tablet (750 mg total) by mouth daily. For 6 more days from 01/18/12           traMADol (ULTRAM) 50 MG tablet Take 1 tablet (50 mg total) by mouth every 6 (six) hours as needed. USED FOR PAIN           zolpidem (AMBIEN) 10 MG tablet Take 1 tablet (10 mg total) by mouth at bedtime.             Discharge Assessment: Filed Vitals:   01/18/12 0518  BP: 100/62  Pulse: 64  Temp: 98.1 F (36.7 C)  Resp: 18   Skin clean, dry and intact without evidence of skin break down, no evidence of skin tears noted. IV catheter discontinued intact. Site without signs and symptoms of complications - no redness or edema noted at insertion site, patient denies c/o pain - only slight tenderness at site.  Dressing with slight pressure applied.  D/c Instructions-Education: Discharge instructions given to ALF via [facility]link.   [name] Simerly Consuella Lose, RN 01/18/2012 2:32 PM

## 2012-01-18 NOTE — Progress Notes (Signed)
CSW informed Pt ready for d.c.  Pt transporting via [facility]. Confirmed with facility. Milus Banister MSW,LCSW w/e Coverage [phone]

## 2012-03-08 ENCOUNTER — Emergency Department (HOSPITAL_COMMUNITY): Payer: Medicare Other

## 2012-03-08 ENCOUNTER — Emergency Department (HOSPITAL_COMMUNITY)
Admission: EM | Admit: 2012-03-08 | Discharge: 2012-03-08 | Disposition: A | Discharge status: Home / Self Care | Payer: Medicare Other | Attending: Emergency Medicine | Admitting: Emergency Medicine

## 2012-03-08 ENCOUNTER — Encounter (HOSPITAL_COMMUNITY): Payer: Self-pay | Admitting: *Deleted

## 2012-03-08 DIAGNOSIS — J449 Chronic obstructive pulmonary disease, unspecified: Secondary | ICD-10-CM | POA: Insufficient documentation

## 2012-03-08 DIAGNOSIS — R079 Chest pain, unspecified: Secondary | ICD-10-CM | POA: Insufficient documentation

## 2012-03-08 DIAGNOSIS — I440 Atrioventricular block, first degree: Secondary | ICD-10-CM | POA: Insufficient documentation

## 2012-03-08 DIAGNOSIS — I2510 Atherosclerotic heart disease of native coronary artery without angina pectoris: Secondary | ICD-10-CM | POA: Insufficient documentation

## 2012-03-08 DIAGNOSIS — F411 Generalized anxiety disorder: Secondary | ICD-10-CM | POA: Insufficient documentation

## 2012-03-08 DIAGNOSIS — J4489 Other specified chronic obstructive pulmonary disease: Secondary | ICD-10-CM | POA: Insufficient documentation

## 2012-03-08 DIAGNOSIS — F419 Anxiety disorder, unspecified: Secondary | ICD-10-CM

## 2012-03-08 LAB — POCT I-STAT TROPONIN I: Troponin i, poc: 0 ng/mL (ref 0.00–0.08)

## 2012-03-08 LAB — POCT I-STAT, CHEM 8
BUN: 24 mg/dL — ABNORMAL HIGH (ref 6–23)
Creatinine, Ser: 1.4 mg/dL — ABNORMAL HIGH (ref 0.50–1.35)
Potassium: 3.8 mEq/L (ref 3.5–5.1)
Sodium: 140 mEq/L (ref 135–145)

## 2012-03-08 LAB — CBC
HCT: 34.7 % — ABNORMAL LOW (ref 39.0–52.0)
Hemoglobin: 11.6 g/dL — ABNORMAL LOW (ref 13.0–17.0)
MCV: 93.8 fL (ref 78.0–100.0)
RBC: 3.7 MIL/uL — ABNORMAL LOW (ref 4.22–5.81)
WBC: 7.7 10*3/uL (ref 4.0–10.5)

## 2012-03-08 LAB — DIFFERENTIAL
Eosinophils Relative: 2 % (ref 0–5)
Lymphocytes Relative: 26 % (ref 12–46)
Lymphs Abs: 2 10*3/uL (ref 0.7–4.0)
Monocytes Absolute: 0.6 10*3/uL (ref 0.1–1.0)

## 2012-03-08 MED ORDER — GLUCAGON HCL (RDNA) 1 MG IJ SOLR
1.0000 mg | Freq: Once | INTRAMUSCULAR | Status: AC
  Administered 2012-03-08: 1 mg via INTRAVENOUS
  Filled 2012-03-08: qty 1

## 2012-03-08 MED ORDER — ONDANSETRON HCL 4 MG/2ML IJ SOLN
4.0000 mg | Freq: Once | INTRAMUSCULAR | Status: AC
  Administered 2012-03-08: 4 mg via INTRAVENOUS
  Filled 2012-03-08: qty 2

## 2012-03-08 MED ORDER — KETOROLAC TROMETHAMINE 30 MG/ML IJ SOLN
30.0000 mg | Freq: Once | INTRAMUSCULAR | Status: AC
  Administered 2012-03-08: 30 mg via INTRAVENOUS
  Filled 2012-03-08: qty 1

## 2012-03-08 NOTE — ED Notes (Signed)
Unable to contact anyone from [facility]  Attempted times 2 to contact them. [phone]

## 2012-03-08 NOTE — ED Notes (Signed)
Patient arrived via EMS from [facility]  Patient upset because they changed his Ambien to Trazadone.  Unable to sleep  C/o right shoulder pain

## 2012-03-08 NOTE — ED Provider Notes (Signed)
History     CSN: [id number]  Arrival date & time 03/08/12  0003   First MD Initiated Contact with Patient 03/08/12 0009      Chief Complaint  Patient presents with  . Anxiety    (Consider location/radiation/quality/duration/timing/severity/associated sxs/prior treatment) Patient is a 71 y.o. male presenting with anxiety and chest pain. The history is provided by the patient. No language interpreter was used.  Anxiety This is a new problem. The current episode started 3 to 5 hours ago. The problem occurs constantly. The problem has not changed since onset.Associated symptoms include chest pain. Pertinent negatives include no abdominal pain, no headaches and no shortness of breath. The symptoms are aggravated by nothing. The symptoms are relieved by nothing. He has tried nothing for the symptoms. The treatment provided no relief.  Chest Pain The chest pain began 3 - 5 days ago. Chest pain occurs constantly. The chest pain is unchanged. Associated with: being upset that he can't sleep on trazedone which is a new medication.  He can't sleep and would prefer his Remus Loffler that he was taken off of. At its most intense, the pain is at 3/10. The pain is currently at 3/10. The severity of the pain is mild. The quality of the pain is described as aching. The pain does not radiate. Exacerbated by: nothing. Pertinent negatives for primary symptoms include no fever, no fatigue, no syncope, no shortness of breath, no cough, no wheezing, no palpitations, no abdominal pain, no nausea, no vomiting and no altered mental status.  Pertinent negatives for associated symptoms include no claudication. He tried nothing for the symptoms. Risk factors include being elderly and male gender.  Pertinent negatives for past medical history include no Marfan's syndrome.  Pertinent negatives for family medical history include: no Marfan's syndrome in family.  Procedure history is negative for cardiac catheterization.      Past Medical History  Diagnosis Date  . Other congenital hamartoses, not elsewhere classified   . Family history of ischemic heart disease   . Family history of malignant neoplasm of gastrointestinal tract   . Allergic rhinitis, cause unspecified   . Other and unspecified hyperlipidemia   . Unspecified essential hypertension   . Obesity hypoventilation syndrome     failed cpap/bipap trial  . Hypothyroidism   . GERD (gastroesophageal reflux disease)   . Arthritis     osteoarthritis  . DEMENTIA     Alzheimer's, senile dementia  . Shortness of breath   . Cancer     renal carcinoma  . Sleep apnea     OXYGEN 24 HR/DAY-NO CPAP  . COPD (chronic obstructive pulmonary disease)     DR. Marcelyn Bruins FOLLOWS PT--PT USING OXYGEN 24 HRS A DAY  . COPD (chronic obstructive pulmonary disease)     PULMONARY CLEARANCE FOR LEFT RENAL CRYOABLATION ON CHART FROM DR. CLANCE -07/26/11  . Coronary artery disease     CARDIAC CLEARANCE  OFFICE NOTE FOR LEFT CRYO ABLATION ON CHART FROM [name] -"RECENT STRESS TEST DID NOT SHOW ISCHEMIA" -"PRESUMED CAD DUE TO CORONARY CALCIFICATIONS"     Past Surgical History  Procedure Date  . Eye surgery     bilateral cataracts  . Joint replacement     bilateral knee replacements  . Carpal tunnel release     right  . Thyroidectomy   . Cholecystectomy 2009  . Knee arthroplasty     2005 LEFT   AND 2007 RIGHT--[name]  . Tumor removal 09/14/2011    Family  History  Problem Relation Age of Onset  . Cancer Other     colon  . Heart disease Other     CAD, MI and Heart Attack    History  Substance Use Topics  . Smoking status: Never Smoker   . Smokeless tobacco: Never Used  . Alcohol Use: No      Review of Systems  Constitutional: Negative for fever and fatigue.  Respiratory: Negative for cough, shortness of breath and wheezing.   Cardiovascular: Positive for chest pain. Negative for palpitations, claudication and syncope.   Gastrointestinal: Negative for nausea, vomiting and abdominal pain.  Neurological: Negative for headaches.  Psychiatric/Behavioral: Negative for altered mental status. The patient is nervous/anxious.   All other systems reviewed and are negative.    Allergies  Review of patient's allergies indicates no known allergies.  Home Medications   Current Outpatient Rx  Name Route Sig Dispense Refill  . ACETAMINOPHEN 325 MG PO TABS Oral Take 650 mg by mouth every 6 (six) hours as needed. USED FOR PAIN/STIFFNESS    . ASPIRIN 325 MG PO TABS Oral Take 325 mg by mouth at bedtime.     Marland Kitchen BISACODYL 5 MG PO TBEC Oral Take 10 mg by mouth daily as needed. FOR CONSTIPATION.     Marland Kitchen CALCITRIOL 0.5 MCG PO CAPS Oral Take 0.5 mcg by mouth daily.     Marland Kitchen CALCIUM CARBONATE-VITAMIN D 500-200 MG-UNIT PO TABS Oral Take 1 tablet by mouth 2 (two) times daily.     Marland Kitchen CLOPIDOGREL BISULFATE 75 MG PO TABS Oral Take 75 mg by mouth every morning.     . CYCLOSPORINE 0.05 % OP EMUL Both Eyes Place 1 drop into both eyes 2 (two) times daily.     Marland Kitchen FLUTICASONE PROPIONATE 50 MCG/ACT NA SUSP Nasal Place 2 sprays into the nose daily.    . IPRATROPIUM BROMIDE 0.02 % IN SOLN Nebulization Take 500 mcg by nebulization every 4 (four) hours as needed. For wheezing    . LEVOTHYROXINE SODIUM 200 MCG PO TABS Oral Take 200 mcg by mouth daily. TAKE WITH THE 25 MCG LEVOTHYROXINE    . LEVOTHYROXINE SODIUM 25 MCG PO TABS Oral Take 25 mcg by mouth daily. Take with of levothyroxine    . LORATADINE 10 MG PO TABS Oral Take 10 mg by mouth daily. Take for 3 weeks    . LORAZEPAM 0.5 MG PO TABS Oral Take 0.5 mg by mouth 2 (two) times daily.     Marland Kitchen LOTEPREDNOL ETABONATE 0.5 % OP SUSP Both Eyes Place 1 drop into both eyes every morning.     Marland Kitchen MELATONIN 2.5-338 MG-MCG SL SUBL Sublingual Place 1 tablet under the tongue at bedtime.    Marland Kitchen METOPROLOL TARTRATE 25 MG PO TABS Oral Take 12.5 mg by mouth 2 (two) times daily. Hold for systolic blood pressure <100     . PANTOPRAZOLE SODIUM 40 MG PO TBEC Oral Take 40 mg by mouth daily.     Marland Kitchen PHOSPHATE ENEMA 7-19 GM/118ML RE ENEM Rectal Place 1 enema rectally every other day as needed. For constipation    . PREGABALIN 75 MG PO CAPS Oral Take 75 mg by mouth 2 (two) times daily.    Bernadette Hoit SODIUM 8.6-50 MG PO TABS Oral Take 2 tablets by mouth 2 (two) times daily.    Marland Kitchen SIMETHICONE 80 MG PO CHEW Oral Chew 80 mg by mouth 4 (four) times daily. 4 times daily BETWEEN MEALS AND AT BEDTIME    .  SUCRALFATE 1 G PO TABS Oral Take 1 g by mouth 4 (four) times daily.     Marland Kitchen TAMSULOSIN HCL 0.4 MG PO CAPS Oral Take 0.4 mg by mouth at bedtime. Hold for systolic blood pressure of <100    . TRAMADOL HCL 50 MG PO TABS Oral Take 1 tablet (50 mg total) by mouth every 6 (six) hours as needed. USED FOR PAIN 30 tablet 0  . WHITE PETROLATUM GEL Topical Apply 1 application topically as needed. APPLY TO AFFECTED AREAS ( RIGHT INNER BUTTOCK) AT BEDTIME AFTER WASHING)    . ZOLPIDEM TARTRATE 10 MG PO TABS Oral Take 1 tablet (10 mg total) by mouth at bedtime. 30 tablet 0    There were no vitals taken for this visit.  Physical Exam  Constitutional: He appears well-developed and well-nourished. No distress.  HENT:  Head: Normocephalic and atraumatic.  Mouth/Throat: Oropharynx is clear and moist.  Eyes: Conjunctivae are normal. Pupils are equal, round, and reactive to light.  Neck: Normal range of motion. Neck supple.  Cardiovascular: Normal rate and regular rhythm.   Pulmonary/Chest: Effort normal and breath sounds normal. He has no wheezes. He has no rales.  Abdominal: Soft. Bowel sounds are normal. There is no tenderness. There is no rebound and no guarding.  Musculoskeletal: Normal range of motion.  Neurological: He is alert. He has normal reflexes.  Skin: Skin is warm and dry. He is not diaphoretic.  Psychiatric: Thought content normal.    ED Course  Procedures (including critical care time)   Labs Reviewed   CBC  DIFFERENTIAL   No results found.   No diagnosis found.    MDM   Date: 03/08/2012  Rate: 59  Rhythm: normal sinus rhythm  QRS Axis: normal  Intervals: PR prolonged  ST/T Wave abnormalities: normal  Conduction Disutrbances:first-degree A-V block   Narrative Interpretation:   Old EKG Reviewed: unchanged       Return for chest pain or shortness of breath, follow up with your family doctor regarding medication change  Adriel Desrosier K Maizie Garno-Rasch, MD 03/08/12 [phone]

## 2012-05-06 ENCOUNTER — Other Ambulatory Visit: Payer: Self-pay | Admitting: Interventional Radiology

## 2012-05-06 DIAGNOSIS — N2889 Other specified disorders of kidney and ureter: Secondary | ICD-10-CM

## 2012-05-20 ENCOUNTER — Other Ambulatory Visit: Payer: Self-pay | Admitting: Emergency Medicine

## 2012-05-20 DIAGNOSIS — N2889 Other specified disorders of kidney and ureter: Secondary | ICD-10-CM

## 2012-05-24 ENCOUNTER — Emergency Department (HOSPITAL_COMMUNITY)
Admission: EM | Admit: 2012-05-24 | Discharge: 2012-05-24 | Disposition: A | Discharge status: Skilled Nursing Facility | Payer: Medicare Other | Attending: Emergency Medicine | Admitting: Emergency Medicine

## 2012-05-24 ENCOUNTER — Encounter (HOSPITAL_COMMUNITY): Payer: Self-pay | Admitting: Emergency Medicine

## 2012-05-24 DIAGNOSIS — E785 Hyperlipidemia, unspecified: Secondary | ICD-10-CM | POA: Insufficient documentation

## 2012-05-24 DIAGNOSIS — G4730 Sleep apnea, unspecified: Secondary | ICD-10-CM | POA: Insufficient documentation

## 2012-05-24 DIAGNOSIS — I10 Essential (primary) hypertension: Secondary | ICD-10-CM | POA: Insufficient documentation

## 2012-05-24 DIAGNOSIS — Z7982 Long term (current) use of aspirin: Secondary | ICD-10-CM | POA: Insufficient documentation

## 2012-05-24 DIAGNOSIS — Z87440 Personal history of urinary (tract) infections: Secondary | ICD-10-CM | POA: Insufficient documentation

## 2012-05-24 DIAGNOSIS — Z79899 Other long term (current) drug therapy: Secondary | ICD-10-CM | POA: Insufficient documentation

## 2012-05-24 DIAGNOSIS — I2510 Atherosclerotic heart disease of native coronary artery without angina pectoris: Secondary | ICD-10-CM | POA: Insufficient documentation

## 2012-05-24 DIAGNOSIS — J449 Chronic obstructive pulmonary disease, unspecified: Secondary | ICD-10-CM | POA: Insufficient documentation

## 2012-05-24 DIAGNOSIS — F0280 Dementia in other diseases classified elsewhere without behavioral disturbance: Secondary | ICD-10-CM | POA: Insufficient documentation

## 2012-05-24 DIAGNOSIS — H00019 Hordeolum externum unspecified eye, unspecified eyelid: Secondary | ICD-10-CM | POA: Insufficient documentation

## 2012-05-24 DIAGNOSIS — Z9981 Dependence on supplemental oxygen: Secondary | ICD-10-CM | POA: Insufficient documentation

## 2012-05-24 DIAGNOSIS — G309 Alzheimer's disease, unspecified: Secondary | ICD-10-CM | POA: Insufficient documentation

## 2012-05-24 DIAGNOSIS — J4489 Other specified chronic obstructive pulmonary disease: Secondary | ICD-10-CM | POA: Insufficient documentation

## 2012-05-24 DIAGNOSIS — R350 Frequency of micturition: Secondary | ICD-10-CM | POA: Insufficient documentation

## 2012-05-24 DIAGNOSIS — K219 Gastro-esophageal reflux disease without esophagitis: Secondary | ICD-10-CM | POA: Insufficient documentation

## 2012-05-24 DIAGNOSIS — E039 Hypothyroidism, unspecified: Secondary | ICD-10-CM | POA: Insufficient documentation

## 2012-05-24 DIAGNOSIS — Z85528 Personal history of other malignant neoplasm of kidney: Secondary | ICD-10-CM | POA: Insufficient documentation

## 2012-05-24 LAB — URINALYSIS, ROUTINE W REFLEX MICROSCOPIC
Nitrite: NEGATIVE
Protein, ur: NEGATIVE mg/dL
Urobilinogen, UA: 0.2 mg/dL (ref 0.0–1.0)

## 2012-05-24 LAB — URINE MICROSCOPIC-ADD ON

## 2012-05-24 MED ORDER — ERYTHROMYCIN 5 MG/GM OP OINT: TOPICAL_OINTMENT | OPHTHALMIC | Status: AC

## 2012-05-24 MED ORDER — SENNOSIDES-DOCUSATE SODIUM 8.6-50 MG PO TABS: 1.0000 | ORAL_TABLET | Freq: Every day | ORAL | Status: DC

## 2012-05-24 NOTE — ED Notes (Signed)
Report called to [facility] and [facility] called for transport.

## 2012-05-24 NOTE — ED Notes (Signed)
Prior to discharge the patient complains that he has had intermittent shortness of breath when he lays down. He does not have shortness of breath at this time, on reexamination his lungs are clear his oxygen level is 100% on room air. He does use oxygen 24 hours a day secondary to exertional dyspnea without a definite source. I have encouraged him to followup with his family Dr. should his symptoms persist. He is also requested a laxative in addition to his Colace, this has been taken care of. The patient appears stable for discharge.  Vida Roller, MD 05/24/12 [phone]

## 2012-05-24 NOTE — ED Notes (Signed)
Pt states that he went to lay down and started having abdominal pain. Pt states he has frequent urination for the last four days especially at night.

## 2012-05-24 NOTE — ED Provider Notes (Signed)
History     CSN: [id number]  Arrival date & time 05/24/12  0107   First MD Initiated Contact with Patient 05/24/12 0148      Chief Complaint  Patient presents with  . Urinary Tract Infection  . Abdominal Pain    (Consider location/radiation/quality/duration/timing/severity/associated sxs/prior treatment) HPI Comments: 71 year old male with a history of COPD, cancer, dementia, hypothyroidism. He presents from his assisted care facility with a complaint of increased urinary frequency. He denies any dysuria but states that he has to urinate at least 4-5 times a night which has increased over the last 4 days. He has no fevers chills nausea vomiting or flank pain. He denies any abdominal pain to me. He has mild constant swelling of his lower extremities which has not changed. He has a history of one episode of a urinary tract infection in the past. He does take Flomax. The symptoms are intermittent, gradually worsening.  Patient is a 71 y.o. male presenting with urinary tract infection and abdominal pain. The history is provided by the patient.  Urinary Tract Infection Associated symptoms include abdominal pain.  Abdominal Pain The primary symptoms of the illness include abdominal pain.    Past Medical History  Diagnosis Date  . Other congenital hamartoses, not elsewhere classified   . Family history of ischemic heart disease   . Family history of malignant neoplasm of gastrointestinal tract   . Allergic rhinitis, cause unspecified   . Other and unspecified hyperlipidemia   . Unspecified essential hypertension   . Obesity hypoventilation syndrome     failed cpap/bipap trial  . Hypothyroidism   . GERD (gastroesophageal reflux disease)   . Arthritis     osteoarthritis  . DEMENTIA     Alzheimer's, senile dementia  . Shortness of breath   . Cancer     renal carcinoma  . Sleep apnea     OXYGEN 24 HR/DAY-NO CPAP  . COPD (chronic obstructive pulmonary disease)     DR. Marcelyn Bruins  FOLLOWS PT--PT USING OXYGEN 24 HRS A DAY  . COPD (chronic obstructive pulmonary disease)     PULMONARY CLEARANCE FOR LEFT RENAL CRYOABLATION ON CHART FROM DR. CLANCE -07/26/11  . Coronary artery disease     CARDIAC CLEARANCE  OFFICE NOTE FOR LEFT CRYO ABLATION ON CHART FROM [name] -"RECENT STRESS TEST DID NOT SHOW ISCHEMIA" -"PRESUMED CAD DUE TO CORONARY CALCIFICATIONS"     Past Surgical History  Procedure Date  . Eye surgery     bilateral cataracts  . Joint replacement     bilateral knee replacements  . Carpal tunnel release     right  . Thyroidectomy   . Cholecystectomy 2009  . Knee arthroplasty     2005 LEFT   AND 2007 RIGHT--[name]  . Tumor removal 09/14/2011    Family History  Problem Relation Age of Onset  . Cancer Other     colon  . Heart disease Other     CAD, MI and Heart Attack    History  Substance Use Topics  . Smoking status: Never Smoker   . Smokeless tobacco: Never Used  . Alcohol Use: No      Review of Systems  Gastrointestinal: Positive for abdominal pain.  All other systems reviewed and are negative.    Allergies  Review of patient's allergies indicates no known allergies.  Home Medications   Current Outpatient Rx  Name Route Sig Dispense Refill  . ASPIRIN 325 MG PO TABS Oral Take 325 mg by  mouth daily.    Marland Kitchen CALCITRIOL 0.5 MCG PO CAPS Oral Take 0.5 mcg by mouth daily.    Marland Kitchen CALCIUM CARBONATE-VITAMIN D 500-200 MG-UNIT PO TABS Oral Take 1 tablet by mouth 2 (two) times daily.     Marland Kitchen CLOPIDOGREL BISULFATE 75 MG PO TABS Oral Take 75 mg by mouth every morning.     . CYCLOSPORINE 0.05 % OP EMUL Both Eyes Place 1 drop into both eyes 2 (two) times daily.     Marland Kitchen FLUTICASONE PROPIONATE 50 MCG/ACT NA SUSP Each Nare Place 1 spray into both nostrils daily.     Marland Kitchen GABAPENTIN 100 MG PO CAPS Oral Take 100 mg by mouth 2 (two) times daily.    Marland Kitchen HYDROCODONE-ACETAMINOPHEN 5-500 MG PO TABS Oral Take 1 tablet by mouth 3 (three) times daily.    Marland Kitchen  LEVOTHYROXINE SODIUM 200 MCG PO TABS Oral Take 200 mcg by mouth daily. TAKE WITH THE 25 MCG LEVOTHYROXINE    . LEVOTHYROXINE SODIUM 25 MCG PO TABS Oral Take 25 mcg by mouth daily. Take with 200 mcg to equal 225 mcg    . LOTEPREDNOL ETABONATE 0.5 % OP SUSP Both Eyes Place 1 drop into both eyes every morning.     Marland Kitchen MELATONIN 2.5-338 MG-MCG SL SUBL Sublingual Place 1 strip under the tongue at bedtime.    Marland Kitchen METOPROLOL TARTRATE 25 MG PO TABS Oral Take 12.5 mg by mouth 2 (two) times daily. Hold for systolic blood pressure <100    . PANTOPRAZOLE SODIUM 40 MG PO TBEC Oral Take 40 mg by mouth daily.     Marland Kitchen PREGABALIN 75 MG PO CAPS Oral Take 75 mg by mouth 2 (two) times daily.    Bernadette Hoit SODIUM 8.6-50 MG PO TABS Oral Take 2 tablets by mouth 2 (two) times daily.    Marland Kitchen SIMETHICONE 80 MG PO CHEW Oral Chew 80 mg by mouth 4 (four) times daily.    . SUCRALFATE 1 G PO TABS Oral Take 1 g by mouth 4 (four) times daily.    . TRAZODONE HCL 50 MG PO TABS Oral Take 50 mg by mouth at bedtime.    . ERYTHROMYCIN 5 MG/GM OP OINT  Place a 1/2 inch ribbon of ointment into the lower eyelid every 6 hours for 7 days 3.5 g 0  . LORAZEPAM 0.5 MG PO TABS Oral Take 0.5 mg by mouth 2 (two) times daily as needed. For anxiety    . METHOCARBAMOL 750 MG PO TABS Oral Take 750 mg by mouth every 8 (eight) hours as needed. For spasm    . METAMUCIL PO Oral Take 2 capsules by mouth daily.    . TRAMADOL HCL 50 MG PO TABS Oral Take 50 mg by mouth every 6 (six) hours as needed. FOR PAIN    . WHITE PETROLATUM GEL Topical Apply 1 application topically as needed. APPLY TO AFFECTED AREAS ( RIGHT INNER BUTTOCK) AT BEDTIME AFTER WASHING)      BP 103/67  Pulse 63  Temp 98.1 F (36.7 C) (Oral)  Resp 15  Ht 6\' 3"  (1.905 m)  Wt 290 lb (131.543 kg)  BMI 36.25 kg/m2  SpO2 100%  Physical Exam  Nursing note and vitals reviewed. Constitutional: He appears well-developed and well-nourished. No distress.  HENT:  Head: Normocephalic and  atraumatic.  Mouth/Throat: Oropharynx is clear and moist. No oropharyngeal exudate.  Eyes: Conjunctivae and EOM are normal. Pupils are equal, round, and reactive to light. Right eye exhibits no discharge. Left eye exhibits  no discharge. No scleral icterus.       Right lower lid with small sty  Neck: Normal range of motion. Neck supple. No JVD present. No thyromegaly present.  Cardiovascular: Normal rate, regular rhythm, normal heart sounds and intact distal pulses.  Exam reveals no gallop and no friction rub.   No murmur heard. Pulmonary/Chest: Effort normal. No respiratory distress. He has wheezes ( Minimal end expiratory wheezing). He has no rales.       No increased work of breathing, no tachypnea, no accessory muscle use, no rales.  Abdominal: Soft. Bowel sounds are normal. He exhibits no distension and no mass. There is no tenderness.       Obese, soft, nontender  Musculoskeletal: Normal range of motion. He exhibits edema ( Mild 1+ edema, no asymmetry). He exhibits no tenderness.  Lymphadenopathy:    He has no cervical adenopathy.  Neurological: He is alert. Coordination normal.  Skin: Skin is warm and dry. No rash noted. No erythema.  Psychiatric: He has a normal mood and affect. His behavior is normal.    ED Course  Procedures (including critical care time)  Labs Reviewed  URINALYSIS, ROUTINE W REFLEX MICROSCOPIC - Abnormal; Notable for the following:    APPearance CLOUDY (*)     Specific Gravity, Urine 1.034 (*)     Leukocytes, UA TRACE (*)     All other components within normal limits  URINE MICROSCOPIC-ADD ON - Abnormal; Notable for the following:    Bacteria, UA FEW (*)     Crystals CA OXALATE CRYSTALS (*)     All other components within normal limits  URINE CULTURE   No results found.   1. Urinary frequency   2. Stye       MDM  The patient has normal vital signs without fever or tachycardia hypotension or hypoxia. He has a complaint of urinary frequency and  evidently has had an episode of urinary infection in the past. He also has a sty in his right lower lid for which you can use a topical ophthalmologic antibiotic. We'll check the urine sample to rule out urinary tract infection. His mental status is normal and his abdomen is soft, nontender. At this time I do not suspect further abdominal pathology.  ED ECG REPORT  I personally interpreted this EKG   Date: 05/24/2012   Rate: 81  Rhythm: normal sinus rhythm  QRS Axis: normal  Intervals: normal  ST/T Wave abnormalities: Inverted T waves in lateral leads and precordial leads, consistent with prior EKG  Conduction Disutrbances:nonspecific intraventricular conduction delay  Narrative Interpretation:   Old EKG Reviewed: Unchanged from 05/16/2012  D/W Pt Re: results, no urinary infection, instructed patient to stop taking Flomax until followup urology this week. Patient states that he has a urologist here in town, will make this appointment in the morning. Urine culture ordered   Vida Roller, MD 05/24/12 [phone]

## 2012-05-24 NOTE — ED Notes (Signed)
Per EMS, pt was at his assisted living and had been having abdominal pain when lying down. Pt requested to be brought to the ED. Pt A&Ox4 and in NAD upon arrival.

## 2012-05-24 NOTE — ED Notes (Signed)
Pt unable to urinate. No I/O at this time per Dr. Hyacinth Meeker. Continue to provide PO fluids.

## 2012-05-24 NOTE — ED Notes (Signed)
Pt has list of complaints including: 1.frequent urination and concern for UTI. Pt denies burning or blood in urine.  2. Abdominal pain when he lays down 3. Left shoulder pain 4. Sty in his right eye

## 2012-05-24 NOTE — ED Notes (Signed)
Bed:WA23<BR> Expected date:<BR> Expected time:<BR> Means of arrival:<BR> Comments:<BR> EMS

## 2012-05-25 LAB — URINE CULTURE: Colony Count: 15000

## 2012-05-29 ENCOUNTER — Encounter (HOSPITAL_COMMUNITY): Payer: Self-pay

## 2012-05-29 ENCOUNTER — Emergency Department (INDEPENDENT_AMBULATORY_CARE_PROVIDER_SITE_OTHER)
Admission: EM | Admit: 2012-05-29 | Discharge: 2012-05-29 | Disposition: A | Discharge status: Home / Self Care | Payer: Medicare Other | Source: Home / Self Care | Attending: Family Medicine | Admitting: Family Medicine

## 2012-05-29 DIAGNOSIS — S91209A Unspecified open wound of unspecified toe(s) with damage to nail, initial encounter: Secondary | ICD-10-CM

## 2012-05-29 DIAGNOSIS — S91109A Unspecified open wound of unspecified toe(s) without damage to nail, initial encounter: Secondary | ICD-10-CM

## 2012-05-29 MED ORDER — CEPHALEXIN 500 MG PO CAPS: 500.0000 mg | ORAL_CAPSULE | Freq: Two times a day (BID) | ORAL | Status: AC

## 2012-05-29 MED ORDER — MUPIROCIN CALCIUM 2 % EX CREA
TOPICAL_CREAM | Freq: Three times a day (TID) | CUTANEOUS | Status: AC
Start: 2012-05-29 — End: 2012-06-05

## 2012-05-29 NOTE — ED Provider Notes (Signed)
History     CSN: [id number]  Arrival date & time 05/29/12  1142   First MD Initiated Contact with Patient 05/29/12 1207      Chief Complaint  Patient presents with  . Foot Injury    (Consider location/radiation/quality/duration/timing/severity/associated sxs/prior treatment) HPI Comments: 71 year old male with multiple comorbidities including COPD and coronary artery disease on chronic anticoagulation. Here complaining of right toe pain and bleeding. States that last night he woke up several times overnight to go to the bathroom and "may have stomped his food against furniture" without noticing. He lives in an assisted living facility and when the staff came to check on him in the morning there was blood in the floor and in his sock. The nail in right 3rd toe is separated and bleeding, reports minimal pain.    Past Medical History  Diagnosis Date  . Other congenital hamartoses, not elsewhere classified   . Family history of ischemic heart disease   . Family history of malignant neoplasm of gastrointestinal tract   . Allergic rhinitis, cause unspecified   . Other and unspecified hyperlipidemia   . Unspecified essential hypertension   . Obesity hypoventilation syndrome     failed cpap/bipap trial  . Hypothyroidism   . GERD (gastroesophageal reflux disease)   . Arthritis     osteoarthritis  . DEMENTIA     Alzheimer's, senile dementia  . Shortness of breath   . Cancer     renal carcinoma  . Sleep apnea     OXYGEN 24 HR/DAY-NO CPAP  . COPD (chronic obstructive pulmonary disease)     DR. Marcelyn Bruins FOLLOWS PT--PT USING OXYGEN 24 HRS A DAY  . COPD (chronic obstructive pulmonary disease)     PULMONARY CLEARANCE FOR LEFT RENAL CRYOABLATION ON CHART FROM DR. CLANCE -07/26/11  . Coronary artery disease     CARDIAC CLEARANCE  OFFICE NOTE FOR LEFT CRYO ABLATION ON CHART FROM [name] -"RECENT STRESS TEST DID NOT SHOW ISCHEMIA" -"PRESUMED CAD DUE TO CORONARY CALCIFICATIONS"      Past Surgical History  Procedure Date  . Eye surgery     bilateral cataracts  . Joint replacement     bilateral knee replacements  . Carpal tunnel release     right  . Thyroidectomy   . Cholecystectomy 2009  . Knee arthroplasty     2005 LEFT   AND 2007 RIGHT--[name]  . Tumor removal 09/14/2011    Family History  Problem Relation Age of Onset  . Cancer Other     colon  . Heart disease Other     CAD, MI and Heart Attack    History  Substance Use Topics  . Smoking status: Never Smoker   . Smokeless tobacco: Never Used  . Alcohol Use: No      Review of Systems  Constitutional:       10 systems reviewed and  pertinent negative and positive symptoms are as per HPI.     Skin:       As per HPI  All other systems reviewed and are negative.    Allergies  Review of patient's allergies indicates no known allergies.  Home Medications   Current Outpatient Rx  Name Route Sig Dispense Refill  . ASPIRIN 325 MG PO TABS Oral Take 325 mg by mouth daily.    Marland Kitchen CALCITRIOL 0.5 MCG PO CAPS Oral Take 0.5 mcg by mouth daily.    Marland Kitchen CALCIUM CARBONATE-VITAMIN D 500-200 MG-UNIT PO TABS Oral Take 1 tablet  by mouth 2 (two) times daily.     . CEPHALEXIN 500 MG PO CAPS Oral Take 1 capsule (500 mg total) by mouth 2 (two) times daily. 10 capsule 0  . CLOPIDOGREL BISULFATE 75 MG PO TABS Oral Take 75 mg by mouth every morning.     . CYCLOSPORINE 0.05 % OP EMUL Both Eyes Place 1 drop into both eyes 2 (two) times daily.     . ERYTHROMYCIN 5 MG/GM OP OINT  Place a 1/2 inch ribbon of ointment into the lower eyelid every 6 hours for 7 days 3.5 g 0  . FLUTICASONE PROPIONATE 50 MCG/ACT NA SUSP Each Nare Place 1 spray into both nostrils daily.     Marland Kitchen GABAPENTIN 100 MG PO CAPS Oral Take 100 mg by mouth 2 (two) times daily.    Marland Kitchen HYDROCODONE-ACETAMINOPHEN 5-500 MG PO TABS Oral Take 1 tablet by mouth 3 (three) times daily.    Marland Kitchen LEVOTHYROXINE SODIUM 200 MCG PO TABS Oral Take 200 mcg by mouth daily.  TAKE WITH THE 25 MCG LEVOTHYROXINE    . LEVOTHYROXINE SODIUM 25 MCG PO TABS Oral Take 25 mcg by mouth daily. Take with 200 mcg to equal 225 mcg    . LORAZEPAM 0.5 MG PO TABS Oral Take 0.5 mg by mouth 2 (two) times daily as needed. For anxiety    . LOTEPREDNOL ETABONATE 0.5 % OP SUSP Both Eyes Place 1 drop into both eyes every morning.     Marland Kitchen MELATONIN 2.5-338 MG-MCG SL SUBL Sublingual Place 1 strip under the tongue at bedtime.    . METHOCARBAMOL 750 MG PO TABS Oral Take 750 mg by mouth every 8 (eight) hours as needed. For spasm    . METOPROLOL TARTRATE 25 MG PO TABS Oral Take 12.5 mg by mouth 2 (two) times daily. Hold for systolic blood pressure <100    . MUPIROCIN CALCIUM 2 % EX CREA Topical Apply topically 3 (three) times daily. 15 g 0  . PANTOPRAZOLE SODIUM 40 MG PO TBEC Oral Take 40 mg by mouth daily.     Marland Kitchen PREGABALIN 75 MG PO CAPS Oral Take 75 mg by mouth 2 (two) times daily.    Marland Kitchen METAMUCIL PO Oral Take 2 capsules by mouth daily.    Bernadette Hoit SODIUM 8.6-50 MG PO TABS Oral Take 2 tablets by mouth 2 (two) times daily.    Bernadette Hoit SODIUM 8.6-50 MG PO TABS Oral Take 1 tablet by mouth daily. 30 tablet 0  . SIMETHICONE 80 MG PO CHEW Oral Chew 80 mg by mouth 4 (four) times daily.    . SUCRALFATE 1 G PO TABS Oral Take 1 g by mouth 4 (four) times daily.    . TRAMADOL HCL 50 MG PO TABS Oral Take 50 mg by mouth every 6 (six) hours as needed. FOR PAIN    . TRAZODONE HCL 50 MG PO TABS Oral Take 50 mg by mouth at bedtime.    . WHITE PETROLATUM GEL Topical Apply 1 application topically as needed. APPLY TO AFFECTED AREAS ( RIGHT INNER BUTTOCK) AT BEDTIME AFTER WASHING)      BP 112/67  Pulse 56  Temp 98.8 F (37.1 C) (Oral)  Resp 20  SpO2 97%  Physical Exam  Nursing note and vitals reviewed. Constitutional: He is oriented to person, place, and time. He appears well-developed and well-nourished. No distress.  HENT:  Head: Normocephalic and atraumatic.  Cardiovascular:  Normal heart sounds.   Pulmonary/Chest: Breath sounds normal.  Patient with portable O2. By [place].  Neurological: He is alert and oriented to person, place, and time.  Skin:       Right third toe with complete nail avulsion. Nail bed appears intact. Bleeding has subsided. No associated erythema, swelling or drainage. No signs of paronychia.     ED Course  Procedures (including critical care time)  Labs Reviewed - No data to display No results found.   1. Nail avulsion of toe       MDM  Complete nail avulsion of the right third toe. Nailbed appears intact with no lacerations. Bleeding subsided here. Toe and nail bed were cleaned; antibiotic ointment and dry dressing applied prior to discharge. Wound care instructions discussed with patient and provided in writing. Also assisted living form was completed with diagnosis and recommendations. Prescribed cephalexin for 5 days and topical mupirocin. Asked to return to medical attention if redness swelling or drainage despite following treatment.     Sharin Grave, MD 05/31/12 [phone]

## 2012-05-29 NOTE — ED Notes (Signed)
State she got up to go to the toilet several times during the night, and when they came into check on him , there was blood all over the floor and his sock was soaking wet; states pain was 1-2; NAD

## 2012-06-10 ENCOUNTER — Emergency Department (HOSPITAL_COMMUNITY)
Admission: EM | Admit: 2012-06-10 | Discharge: 2012-06-10 | Disposition: A | Discharge status: Home / Self Care | Payer: Medicare Other | Attending: Emergency Medicine | Admitting: Emergency Medicine

## 2012-06-10 ENCOUNTER — Encounter (HOSPITAL_COMMUNITY): Payer: Self-pay | Admitting: *Deleted

## 2012-06-10 ENCOUNTER — Emergency Department (HOSPITAL_COMMUNITY): Payer: Medicare Other

## 2012-06-10 DIAGNOSIS — R0602 Shortness of breath: Secondary | ICD-10-CM | POA: Insufficient documentation

## 2012-06-10 DIAGNOSIS — R609 Edema, unspecified: Secondary | ICD-10-CM | POA: Insufficient documentation

## 2012-06-10 DIAGNOSIS — Z9981 Dependence on supplemental oxygen: Secondary | ICD-10-CM | POA: Insufficient documentation

## 2012-06-10 DIAGNOSIS — M7918 Myalgia, other site: Secondary | ICD-10-CM

## 2012-06-10 DIAGNOSIS — J4489 Other specified chronic obstructive pulmonary disease: Secondary | ICD-10-CM | POA: Insufficient documentation

## 2012-06-10 DIAGNOSIS — J449 Chronic obstructive pulmonary disease, unspecified: Secondary | ICD-10-CM | POA: Insufficient documentation

## 2012-06-10 DIAGNOSIS — IMO0001 Reserved for inherently not codable concepts without codable children: Secondary | ICD-10-CM | POA: Insufficient documentation

## 2012-06-10 DIAGNOSIS — R079 Chest pain, unspecified: Secondary | ICD-10-CM | POA: Insufficient documentation

## 2012-06-10 LAB — CBC
MCH: 32.2 pg (ref 26.0–34.0)
Platelets: 156 10*3/uL (ref 150–400)
RBC: 3.6 MIL/uL — ABNORMAL LOW (ref 4.22–5.81)
WBC: 7.2 10*3/uL (ref 4.0–10.5)

## 2012-06-10 LAB — BASIC METABOLIC PANEL
CO2: 31 mEq/L (ref 19–32)
Calcium: 8.1 mg/dL — ABNORMAL LOW (ref 8.4–10.5)
Potassium: 4.3 mEq/L (ref 3.5–5.1)
Sodium: 141 mEq/L (ref 135–145)

## 2012-06-10 LAB — TROPONIN I: Troponin I: <0.3 ng/mL (ref <0.30)

## 2012-06-10 MED ORDER — OXYCODONE-ACETAMINOPHEN 5-325 MG PO TABS: 1.0000 | ORAL_TABLET | Freq: Four times a day (QID) | ORAL | Status: AC | PRN

## 2012-06-10 MED ORDER — OXYCODONE-ACETAMINOPHEN 5-325 MG PO TABS
1.0000 | ORAL_TABLET | Freq: Once | ORAL | Status: AC
  Administered 2012-06-10: 1 via ORAL
  Filled 2012-06-10: qty 1

## 2012-06-10 NOTE — ED Notes (Signed)
EKG done on arrival and shown to Dr Radford Pax with old EKG.

## 2012-06-10 NOTE — ED Provider Notes (Signed)
History     CSN: [id number]  Arrival date & time 06/10/12  1210   First MD Initiated Contact with Patient 06/10/12 1243      Chief Complaint  Patient presents with  . Chest Pain  . Abdominal Pain  . Extremity Pain    HPI 71 yo male with multiple medical problems including morbid obesity, COPD (on 3L at home), HTN who presents with 1day history of left sided chest discomfort. Pain is worst with movement, coughing and pressure along left side. Pain is sharp, intermittent and lasts a few minutes. He denies any nausea or vomiting or diaphoresis. He reports falling on his bottom yesterday from missing his chair, although did not have any pain at the time.  He also complains of left sided hip and leg pain. Hip pain is worst with movement and walking. No bowel or urinary incontinence. No increase swelling. Patient reports that he has not been able to sleep at night and thinks that these issues may be related to lack of sleep. He received nitro x1 without relief, per EMS.  No diarrhea, constipation, dysuria, hematuria. No fevers or chills.  Past Medical History  Diagnosis Date  . Other congenital hamartoses, not elsewhere classified   . Family history of ischemic heart disease   . Family history of malignant neoplasm of gastrointestinal tract   . Allergic rhinitis, cause unspecified   . Other and unspecified hyperlipidemia   . Unspecified essential hypertension   . Obesity hypoventilation syndrome     failed cpap/bipap trial  . Hypothyroidism   . GERD (gastroesophageal reflux disease)   . Arthritis     osteoarthritis  . DEMENTIA     Alzheimer's, senile dementia  . Shortness of breath   . Cancer     renal carcinoma  . Sleep apnea     OXYGEN 24 HR/DAY-NO CPAP  . COPD (chronic obstructive pulmonary disease)     DR. Marcelyn Bruins FOLLOWS PT--PT USING OXYGEN 24 HRS A DAY  . COPD (chronic obstructive pulmonary disease)     PULMONARY CLEARANCE FOR LEFT RENAL CRYOABLATION ON CHART FROM DR.  CLANCE -07/26/11  . Coronary artery disease     CARDIAC CLEARANCE  OFFICE NOTE FOR LEFT CRYO ABLATION ON CHART FROM [name] -"RECENT STRESS TEST DID NOT SHOW ISCHEMIA" -"PRESUMED CAD DUE TO CORONARY CALCIFICATIONS"     Past Surgical History  Procedure Date  . Eye surgery     bilateral cataracts  . Joint replacement     bilateral knee replacements  . Carpal tunnel release     right  . Thyroidectomy   . Cholecystectomy 2009  . Knee arthroplasty     2005 LEFT   AND 2007 RIGHT--[name]  . Tumor removal 09/14/2011    Family History  Problem Relation Age of Onset  . Cancer Other     colon  . Heart disease Other     CAD, MI and Heart Attack    History  Substance Use Topics  . Smoking status: Never Smoker   . Smokeless tobacco: Never Used  . Alcohol Use: No      Review of Systems  All other systems reviewed and are negative.    Allergies  Review of patient's allergies indicates no known allergies.  Home Medications   Current Outpatient Rx  Name Route Sig Dispense Refill  . ASPIRIN 325 MG PO TABS Oral Take 325 mg by mouth daily.    . AZITHROMYCIN 1 % OP SOLN Right Eye Place  1 drop into the right eye 2 (two) times daily.    Marland Kitchen CALCITRIOL 0.5 MCG PO CAPS Oral Take 0.5 mcg by mouth daily.    Marland Kitchen CALCIUM CARBONATE-VITAMIN D 500-200 MG-UNIT PO TABS Oral Take 1 tablet by mouth 2 (two) times daily.     Marland Kitchen CLOPIDOGREL BISULFATE 75 MG PO TABS Oral Take 75 mg by mouth every morning.     . CYCLOSPORINE 0.05 % OP EMUL Both Eyes Place 1 drop into both eyes 2 (two) times daily.     Marland Kitchen DIPHENHYDRAMINE HCL 25 MG PO CAPS Oral Take 25 mg by mouth at bedtime as needed. For sleep    . FLUTICASONE PROPIONATE 50 MCG/ACT NA SUSP Each Nare Place 1 spray into both nostrils daily.     Marland Kitchen GABAPENTIN 100 MG PO CAPS Oral Take 100 mg by mouth 2 (two) times daily.    Marland Kitchen HYDROCODONE-ACETAMINOPHEN 5-500 MG PO TABS Oral Take 1 tablet by mouth 3 (three) times daily.    . IPRATROPIUM BROMIDE 0.02 % IN  SOLN Nebulization Take 500 mcg by nebulization every hour as needed. For shortness of breath    . LEVOTHYROXINE SODIUM 200 MCG PO TABS Oral Take 200 mcg by mouth daily. TAKE WITH THE 25 MCG LEVOTHYROXINE    . LEVOTHYROXINE SODIUM 25 MCG PO TABS Oral Take 25 mcg by mouth daily. Take with 200 mcg to equal 225 mcg    . LORAZEPAM 0.5 MG PO TABS Oral Take 0.5 mg by mouth 2 (two) times daily as needed. For anxiety    . LOTEPREDNOL ETABONATE 0.5 % OP SUSP Both Eyes Place 1 drop into both eyes every morning.     Marland Kitchen MELATONIN 2.5-338 MG-MCG SL SUBL Sublingual Place 1 strip under the tongue at bedtime.    . MELOXICAM 7.5 MG PO TABS Oral Take 7.5 mg by mouth daily.    Marland Kitchen METHOCARBAMOL 750 MG PO TABS Oral Take 750 mg by mouth every 8 (eight) hours as needed. For spasm    . METOPROLOL TARTRATE 25 MG PO TABS Oral Take 12.5 mg by mouth 2 (two) times daily. Hold for systolic blood pressure <100    . MUPIROCIN 2 % EX OINT Topical Apply 1 application topically daily.    . NYSTATIN 100000 UNIT/GM EX POWD Topical Apply 1 g topically 2 (two) times daily.    Marland Kitchen PANTOPRAZOLE SODIUM 40 MG PO TBEC Oral Take 40 mg by mouth daily.     Marland Kitchen PREGABALIN 75 MG PO CAPS Oral Take 75 mg by mouth 2 (two) times daily.    Marland Kitchen METAMUCIL PO Oral Take 2 capsules by mouth daily.    Bernadette Hoit SODIUM 8.6-50 MG PO TABS Oral Take 2 tablets by mouth 2 (two) times daily.    Marland Kitchen SIMETHICONE 80 MG PO CHEW Oral Chew 80 mg by mouth 4 (four) times daily.    . SUCRALFATE 1 G PO TABS Oral Take 1 g by mouth 4 (four) times daily.    Marland Kitchen TAMSULOSIN HCL 0.4 MG PO CAPS Oral Take 0.4 mg by mouth daily.    . TRAMADOL HCL 50 MG PO TABS Oral Take 50 mg by mouth every 6 (six) hours as needed. FOR PAIN    . TRAZODONE HCL 50 MG PO TABS Oral Take 50 mg by mouth at bedtime.    . WHITE PETROLATUM GEL Topical Apply 1 application topically as needed. APPLY TO AFFECTED AREAS ( RIGHT INNER BUTTOCK) AT BEDTIME AFTER WASHING)    . OXYCODONE-ACETAMINOPHEN  5-325 MG PO  TABS Oral Take 1 tablet by mouth every 6 (six) hours as needed for pain. 30 tablet 0    BP 111/61  Temp 97.6 F (36.4 C) (Oral)  Resp 20  SpO2 99%  Physical Exam  Vitals reviewed. Constitutional: He is oriented to person, place, and time.       Uncomfortable appearing when pain occurs for a few minutes.   HENT:  Head: Atraumatic.  Nose: Nose normal.  Mouth/Throat: Oropharynx is clear and moist.  Eyes: EOM are normal. Pupils are equal, round, and reactive to light.  Neck: Normal range of motion. Neck supple.  Cardiovascular: Normal rate and regular rhythm.        Distant heart sounds  Pulmonary/Chest: No respiratory distress. He has no wheezes. He exhibits tenderness.       On 3L nasal canula. In no acute respiratory distress.  Tenderness to palpation along inferior ribs on left side.   Abdominal: Soft. Bowel sounds are normal. He exhibits no distension.  Musculoskeletal: He exhibits edema.       3+ pitting edema bilaterally.  Tenderness to palpation along left greater trochanter. Abduction and flexion of hip intact.  5/5 strength with knee extension and flexion.  Tenderness to palpation along left tibia. Negative Homan's.   Neurological: He is alert and oriented to person, place, and time. No cranial nerve deficit.  Skin: Skin is warm and dry.    ED Course  Procedures (including critical care time)   Date: 06/10/2012  Rate: 61  Rhythm: normal sinus rhythm  QRS Axis: left axis deviation  Intervals: normal  ST/T Wave abnormalities: normal  Conduction Disutrbances: poor R wave progression  Narrative Interpretation:   Old EKG Reviewed: 03/11/12 No significant changes noted    Labs Reviewed  CBC - Abnormal; Notable for the following:    RBC 3.60 (*)     Hemoglobin 11.6 (*)     HCT 35.0 (*)     All other components within normal limits  BASIC METABOLIC PANEL - Abnormal; Notable for the following:    Glucose, Bld 100 (*)     Calcium 8.1 (*)     GFR calc non Af Amer 61  (*)     GFR calc Af Amer 71 (*)     All other components within normal limits  TROPONIN I   Dg Chest Portable 1 View  06/10/2012  *RADIOLOGY REPORT*  Clinical Data: Shortness of breath.  Chest pain.  PORTABLE CHEST - 1 VIEW  Comparison: 03/08/2012  Findings: Stable cardiomegaly noted with scarring along both hemidiaphragms. Low lung volumes are present, causing crowding of the pulmonary vasculature.  No discrete pleural effusion noted.  Degenerative glenohumeral and acromioclavicular arthropathy noted.  IMPRESSION:  1.  Cardiomegaly, without edema. 2.  Stable bibasilar scarring or atelectasis.   Original Report Authenticated By: Dellia Cloud, M.D.      1. Musculoskeletal pain       MDM  Negative troponin and EKG with atypical presentation. Given pain exacerbated with movement and palpation, this is likely musculoskeletal in nature. Patient not hypoxic compared to baseline 3L, not tachypneic or tachycardic making PE unlikely.  Pain in left leg is worst with palpation of tibia and is likely musculoskeletal in nature.  Patient discharged home with percocet 5/325 for pain control as well as instructions to follow up with PCP.          Lonia Skinner, MD 06/10/12 [phone]

## 2012-06-10 NOTE — ED Notes (Signed)
NAD noted at time of d/c with [facility]. Pt given meal bag prior to [facility] arrival.

## 2012-06-10 NOTE — ED Notes (Signed)
Placed call to [facility] for transport back home 

## 2012-06-10 NOTE — ED Notes (Signed)
Patient states left sided chest discomfort starting yesterday, patient also with generalized abdominal pain and left leg pain starting approx same time.  Patient states he has been having these issues due to lack of sleep, patient receiving nitro x 1 per EMS with no relief, patient states no change in mobility or extremity movement, only aching

## 2012-06-11 NOTE — ED Provider Notes (Signed)
I saw and evaluated the patient, reviewed the resident's note and I agree with the findings and plan.   .Face to face Exam:  General:  Awake HEENT:  Atraumatic Resp:  Normal effort Abd:  Nondistended Neuro:No focal weakness Lymph: No adenopathy   Nelia Shi, MD 06/11/12 2326

## 2012-06-24 ENCOUNTER — Ambulatory Visit
Admission: RE | Admit: 2012-06-24 | Discharge: 2012-06-24 | Disposition: A | Discharge status: Home / Self Care | Payer: Medicare Other | Source: Ambulatory Visit | Attending: Interventional Radiology | Admitting: Interventional Radiology

## 2012-06-24 ENCOUNTER — Ambulatory Visit (HOSPITAL_COMMUNITY)
Admission: RE | Admit: 2012-06-24 | Discharge: 2012-06-24 | Disposition: A | Discharge status: Home / Self Care | Payer: Medicare Other | Source: Ambulatory Visit | Attending: Interventional Radiology | Admitting: Interventional Radiology

## 2012-06-24 DIAGNOSIS — N200 Calculus of kidney: Secondary | ICD-10-CM | POA: Insufficient documentation

## 2012-06-24 DIAGNOSIS — N2889 Other specified disorders of kidney and ureter: Secondary | ICD-10-CM

## 2012-06-24 DIAGNOSIS — N289 Disorder of kidney and ureter, unspecified: Secondary | ICD-10-CM | POA: Insufficient documentation

## 2012-06-24 MED ORDER — IOHEXOL 300 MG/ML  SOLN
100.0000 mL | Freq: Once | INTRAMUSCULAR | Status: AC | PRN
  Administered 2012-06-24: 100 mL via INTRAVENOUS

## 2012-06-24 NOTE — Progress Notes (Signed)
Patient ID: [name] [name], male   DOB: Mar 17, 1941, 71 y.o.   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  History: [name] returns for follow-up. He has complaints of some mild back and abdominal pain. He has had some recent worsening constipation. The patient denies fever or urinary symptoms.  Review of Systems: No nausea or vomiting. No hematuria or dysuria. No change in urinary habits.  Exam: Vital signs: Blood pressure 115/62, pulse 74, respirations 16, temperature 98.3, oxygen saturation 92% on 3 liters oxygen. Abdomen: Soft and nontender. No flank tenderness elicited.  Labs: Creatinine 1.2 and estimated GFR 60 ml/minute on 06/05/2012.  Imaging: Follow-up CT performed today demonstrates decreased size of the left lower pole renal ablation defect. There is no evidence of enhancement within ablated tissue. No new renal lesions identified.  Assessment and Plan: No evidence of tumor recurrence 9 months status post cryoablation of a left lower pole renal tumor. Given appearance of the ablation defect by CT with evidence of continued diminishment in size of the ablation defect, I have recommended follow-up CT in 1 year. The patient is agreeable.

## 2012-06-24 NOTE — Progress Notes (Signed)
 F/U POST LT RENAL ABLATION.  PT DENIES FEVER, SWEATS,NAUSEA, VOMITING.  GOOD APPETITE.  PT DOES HAVE  SOME MID BACK, ABD/PELVIC PAIN SINCE PROCEDURE.  DOES COMPLAIN OF CONSTIPATION, BUT TAKES LAXATIVE TO HELP.

## 2012-07-02 ENCOUNTER — Ambulatory Visit: Payer: Medicare Other | Admitting: Endocrinology

## 2012-07-06 ENCOUNTER — Ambulatory Visit: Payer: Medicare Other | Admitting: Endocrinology

## 2012-07-06 DIAGNOSIS — Z0289 Encounter for other administrative examinations: Secondary | ICD-10-CM

## 2012-07-23 ENCOUNTER — Emergency Department (HOSPITAL_COMMUNITY): Payer: Medicare Other

## 2012-07-23 ENCOUNTER — Encounter (HOSPITAL_COMMUNITY): Payer: Self-pay | Admitting: Emergency Medicine

## 2012-07-23 ENCOUNTER — Ambulatory Visit: Payer: Medicare Other | Admitting: Pulmonary Disease

## 2012-07-23 ENCOUNTER — Emergency Department (HOSPITAL_COMMUNITY)
Admission: EM | Admit: 2012-07-23 | Discharge: 2012-07-25 | Payer: Medicare Other | Attending: Emergency Medicine | Admitting: Emergency Medicine

## 2012-07-23 DIAGNOSIS — J4489 Other specified chronic obstructive pulmonary disease: Secondary | ICD-10-CM | POA: Insufficient documentation

## 2012-07-23 DIAGNOSIS — Z79899 Other long term (current) drug therapy: Secondary | ICD-10-CM | POA: Insufficient documentation

## 2012-07-23 DIAGNOSIS — J449 Chronic obstructive pulmonary disease, unspecified: Secondary | ICD-10-CM | POA: Insufficient documentation

## 2012-07-23 DIAGNOSIS — F3289 Other specified depressive episodes: Secondary | ICD-10-CM | POA: Insufficient documentation

## 2012-07-23 DIAGNOSIS — Z9089 Acquired absence of other organs: Secondary | ICD-10-CM | POA: Insufficient documentation

## 2012-07-23 DIAGNOSIS — F329 Major depressive disorder, single episode, unspecified: Secondary | ICD-10-CM

## 2012-07-23 DIAGNOSIS — I2510 Atherosclerotic heart disease of native coronary artery without angina pectoris: Secondary | ICD-10-CM | POA: Insufficient documentation

## 2012-07-23 DIAGNOSIS — F0280 Dementia in other diseases classified elsewhere without behavioral disturbance: Secondary | ICD-10-CM | POA: Insufficient documentation

## 2012-07-23 DIAGNOSIS — G309 Alzheimer's disease, unspecified: Secondary | ICD-10-CM | POA: Insufficient documentation

## 2012-07-23 DIAGNOSIS — G4700 Insomnia, unspecified: Secondary | ICD-10-CM | POA: Insufficient documentation

## 2012-07-23 DIAGNOSIS — I10 Essential (primary) hypertension: Secondary | ICD-10-CM | POA: Insufficient documentation

## 2012-07-23 DIAGNOSIS — R45851 Suicidal ideations: Secondary | ICD-10-CM | POA: Insufficient documentation

## 2012-07-23 LAB — URINE MICROSCOPIC-ADD ON

## 2012-07-23 LAB — RAPID URINE DRUG SCREEN, HOSP PERFORMED
Benzodiazepines: NOT DETECTED
Cocaine: NOT DETECTED
Opiates: POSITIVE — AB
Tetrahydrocannabinol: NOT DETECTED

## 2012-07-23 LAB — COMPREHENSIVE METABOLIC PANEL
BUN: 18 mg/dL (ref 6–23)
Calcium: 7.6 mg/dL — ABNORMAL LOW (ref 8.4–10.5)
GFR calc Af Amer: 72 mL/min — ABNORMAL LOW (ref >90)
Glucose, Bld: 115 mg/dL — ABNORMAL HIGH (ref 70–99)
Total Protein: 6 g/dL (ref 6.0–8.3)

## 2012-07-23 LAB — CBC WITH DIFFERENTIAL/PLATELET
Eosinophils Absolute: 0.2 10*3/uL (ref 0.0–0.7)
Eosinophils Relative: 2 % (ref 0–5)
Hemoglobin: 12.3 g/dL — ABNORMAL LOW (ref 13.0–17.0)
Lymphs Abs: 1.2 10*3/uL (ref 0.7–4.0)
MCH: 31.7 pg (ref 26.0–34.0)
MCV: 95.4 fL (ref 78.0–100.0)
Monocytes Relative: 8 % (ref 3–12)
Platelets: 172 10*3/uL (ref 150–400)
RBC: 3.88 MIL/uL — ABNORMAL LOW (ref 4.22–5.81)

## 2012-07-23 LAB — URINALYSIS, ROUTINE W REFLEX MICROSCOPIC
Nitrite: NEGATIVE
Specific Gravity, Urine: 1.029 (ref 1.005–1.030)
pH: 7.5 (ref 5.0–8.0)

## 2012-07-23 LAB — SALICYLATE LEVEL: Salicylate Lvl: <2 mg/dL — ABNORMAL LOW (ref 2.8–20.0)

## 2012-07-23 MED ORDER — CYCLOSPORINE 0.05 % OP EMUL
1.0000 [drp] | Freq: Two times a day (BID) | OPHTHALMIC | Status: DC
  Administered 2012-07-23 – 2012-07-25 (×4): 1 [drp] via OPHTHALMIC
  Filled 2012-07-23 (×5): qty 1

## 2012-07-23 MED ORDER — SUCRALFATE 1 G PO TABS
1.0000 g | ORAL_TABLET | Freq: Three times a day (TID) | ORAL | Status: DC
  Administered 2012-07-23 – 2012-07-25 (×7): 1 g via ORAL
  Filled 2012-07-23 (×11): qty 1

## 2012-07-23 MED ORDER — TAMSULOSIN HCL 0.4 MG PO CAPS
0.4000 mg | ORAL_CAPSULE | Freq: Every day | ORAL | Status: DC
  Administered 2012-07-23 – 2012-07-25 (×3): 0.4 mg via ORAL
  Filled 2012-07-23 (×3): qty 1

## 2012-07-23 MED ORDER — ZINC OXIDE 20 % EX OINT
TOPICAL_OINTMENT | CUTANEOUS | Status: DC | PRN
  Administered 2012-07-25: 11:00:00 via TOPICAL
  Filled 2012-07-23: qty 28.35

## 2012-07-23 MED ORDER — LEVOTHYROXINE SODIUM 25 MCG PO TABS
25.0000 ug | ORAL_TABLET | Freq: Every day | ORAL | Status: DC
  Administered 2012-07-24 – 2012-07-25 (×2): 25 ug via ORAL
  Filled 2012-07-23 (×4): qty 1

## 2012-07-23 MED ORDER — PREGABALIN 50 MG PO CAPS
75.0000 mg | ORAL_CAPSULE | Freq: Two times a day (BID) | ORAL | Status: DC
  Administered 2012-07-23 – 2012-07-25 (×4): 75 mg via ORAL
  Filled 2012-07-23 (×4): qty 1

## 2012-07-23 MED ORDER — SENNOSIDES-DOCUSATE SODIUM 8.6-50 MG PO TABS
1.0000 | ORAL_TABLET | Freq: Every day | ORAL | Status: DC
  Administered 2012-07-23 – 2012-07-25 (×3): 1 via ORAL
  Filled 2012-07-23 (×3): qty 1

## 2012-07-23 MED ORDER — LORAZEPAM 0.5 MG PO TABS
0.5000 mg | ORAL_TABLET | Freq: Four times a day (QID) | ORAL | Status: DC | PRN
  Administered 2012-07-23 – 2012-07-25 (×3): 0.5 mg via ORAL
  Filled 2012-07-23 (×3): qty 1

## 2012-07-23 MED ORDER — ASPIRIN 325 MG PO TABS
325.0000 mg | ORAL_TABLET | Freq: Every day | ORAL | Status: DC
  Administered 2012-07-23 – 2012-07-25 (×3): 325 mg via ORAL
  Filled 2012-07-23 (×3): qty 1

## 2012-07-23 MED ORDER — METHYLCELLULOSE 1 % OP SOLN: 1.0000 [drp] | Freq: Two times a day (BID) | OPHTHALMIC | Status: DC

## 2012-07-23 MED ORDER — LEVOTHYROXINE SODIUM 200 MCG PO TABS
200.0000 ug | ORAL_TABLET | Freq: Every day | ORAL | Status: DC
  Administered 2012-07-24 – 2012-07-25 (×2): 200 ug via ORAL
  Filled 2012-07-23 (×4): qty 1

## 2012-07-23 MED ORDER — ONDANSETRON HCL 4 MG PO TABS: 4.0000 mg | ORAL_TABLET | Freq: Three times a day (TID) | ORAL | Status: DC | PRN

## 2012-07-23 MED ORDER — ACETAMINOPHEN 325 MG PO TABS: 650.0000 mg | ORAL_TABLET | ORAL | Status: DC | PRN

## 2012-07-23 MED ORDER — NICOTINE 21 MG/24HR TD PT24
21.0000 mg | MEDICATED_PATCH | Freq: Every day | TRANSDERMAL | Status: DC
  Administered 2012-07-24: 21 mg via TRANSDERMAL
  Filled 2012-07-23 (×2): qty 1

## 2012-07-23 MED ORDER — POLYVINYL ALCOHOL 1.4 % OP SOLN
1.0000 [drp] | Freq: Two times a day (BID) | OPHTHALMIC | Status: DC
  Administered 2012-07-23 – 2012-07-25 (×4): 1 [drp] via OPHTHALMIC
  Filled 2012-07-23: qty 15

## 2012-07-23 MED ORDER — IPRATROPIUM BROMIDE 0.02 % IN SOLN: 500.0000 ug | RESPIRATORY_TRACT | Status: DC | PRN

## 2012-07-23 MED ORDER — CLOPIDOGREL BISULFATE 75 MG PO TABS
75.0000 mg | ORAL_TABLET | Freq: Every day | ORAL | Status: DC
  Administered 2012-07-24 – 2012-07-25 (×2): 75 mg via ORAL
  Filled 2012-07-23 (×4): qty 1

## 2012-07-23 MED ORDER — CALCITRIOL 0.5 MCG PO CAPS
0.5000 ug | ORAL_CAPSULE | Freq: Every day | ORAL | Status: DC
  Administered 2012-07-24 – 2012-07-25 (×2): 0.5 ug via ORAL
  Filled 2012-07-23 (×3): qty 1

## 2012-07-23 MED ORDER — IBUPROFEN 200 MG PO TABS
600.0000 mg | ORAL_TABLET | Freq: Three times a day (TID) | ORAL | Status: DC | PRN
  Administered 2012-07-23 – 2012-07-24 (×2): 600 mg via ORAL
  Filled 2012-07-23 (×2): qty 3

## 2012-07-23 MED ORDER — ZOLPIDEM TARTRATE 5 MG PO TABS
5.0000 mg | ORAL_TABLET | Freq: Every evening | ORAL | Status: DC | PRN
  Administered 2012-07-23 – 2012-07-24 (×2): 5 mg via ORAL
  Filled 2012-07-23 (×2): qty 1

## 2012-07-23 MED ORDER — PANTOPRAZOLE SODIUM 40 MG PO TBEC
40.0000 mg | DELAYED_RELEASE_TABLET | Freq: Every day | ORAL | Status: DC
  Administered 2012-07-23 – 2012-07-25 (×4): 40 mg via ORAL
  Filled 2012-07-23 (×3): qty 1

## 2012-07-23 MED ORDER — LORATADINE 10 MG PO TABS
10.0000 mg | ORAL_TABLET | Freq: Every day | ORAL | Status: DC
  Administered 2012-07-24 – 2012-07-25 (×2): 10 mg via ORAL
  Filled 2012-07-23 (×2): qty 1

## 2012-07-23 MED ORDER — METOPROLOL TARTRATE 25 MG PO TABS
12.5000 mg | ORAL_TABLET | Freq: Two times a day (BID) | ORAL | Status: DC
  Administered 2012-07-23 – 2012-07-24 (×3): 12.5 mg via ORAL
  Filled 2012-07-23 (×3): qty 1

## 2012-07-23 MED ORDER — CITALOPRAM HYDROBROMIDE 20 MG PO TABS
20.0000 mg | ORAL_TABLET | Freq: Every day | ORAL | Status: DC
  Administered 2012-07-24 – 2012-07-25 (×2): 20 mg via ORAL
  Filled 2012-07-23 (×3): qty 1

## 2012-07-23 MED ORDER — NYSTATIN 100000 UNIT/GM EX POWD
Freq: Two times a day (BID) | CUTANEOUS | Status: DC
  Administered 2012-07-24 – 2012-07-25 (×3): via TOPICAL
  Filled 2012-07-23: qty 15

## 2012-07-23 MED ORDER — BAZA PROTECT EX CREA: TOPICAL_CREAM | Freq: Every day | CUTANEOUS | Status: DC | PRN

## 2012-07-23 MED ORDER — ALUM & MAG HYDROXIDE-SIMETH 200-200-20 MG/5ML PO SUSP: 30.0000 mL | ORAL | Status: DC | PRN

## 2012-07-23 MED ORDER — HYDROCODONE-ACETAMINOPHEN 5-325 MG PO TABS: 1.0000 | ORAL_TABLET | Freq: Four times a day (QID) | ORAL | Status: DC | PRN

## 2012-07-23 MED ORDER — LOTEPREDNOL ETABONATE 0.5 % OP SUSP
1.0000 [drp] | Freq: Every day | OPHTHALMIC | Status: DC
  Administered 2012-07-23 – 2012-07-25 (×3): 1 [drp] via OPHTHALMIC
  Filled 2012-07-23: qty 5

## 2012-07-23 MED ORDER — GABAPENTIN 100 MG PO CAPS
100.0000 mg | ORAL_CAPSULE | Freq: Two times a day (BID) | ORAL | Status: DC
  Administered 2012-07-23 – 2012-07-25 (×4): 100 mg via ORAL
  Filled 2012-07-23 (×5): qty 1

## 2012-07-23 NOTE — ED Notes (Signed)
A short while ago pt asked about his calmer down pill and his sleeping pill . Explained to him waiting for the PA to order home medications.

## 2012-07-23 NOTE — ED Notes (Signed)
rn further assessed pt. Pt reports 15 years ago suicidal ideations when his late wife was having lots of medical issues. At time pt owned a gun, but after talking to the preacher, pt got rid of the gun. Pt since then has not had SI thoughts. pts wife died 5 years ago.   Lately pt has not been getting a good nights sleep, or 8 hours of sleep per night. Pt has only been getting 5 hours of sleep regularly. 3 weeks ago pt started celexa to help reduce "tension/ frustration". Pt reports medication has not helped enough, and he still only gets 5 hours of sleep a night. Last night pt woke up and was unable to sleep and became fustrated/tense and thought about taking pills "to end it all and go be with his wife, annie".  Pt is calm, cooperative, and friendly.   Pt denies SI at present, reports he would never actually do anything to hurt himself. Denies auditory/visual hallucinations. At present pt hopes that hospital will be able to treat him so that he can get "8 hours of sleep a night".

## 2012-07-23 NOTE — ED Notes (Addendum)
States has "great deal of frustration, anxiety. Felt like taking a bunch of pills. Haven't been getting enough sleep"  To ED via [facility] . States woke up with chest pain, anxiety, depression, frustration. On oxygen 100% of the time.

## 2012-07-23 NOTE — ED Notes (Addendum)
Started on celexa 3 weeks ago, had thoughts of killing self in middle of night, lives at [facility] living. Hx of SI in past-- plan to use pills.

## 2012-07-23 NOTE — ED Notes (Signed)
Pt has 3 bags 1 ox tank and a gray bag full of clothes with 1 bible and a cell phone charger/ pt has been wanded items are at triage dresk

## 2012-07-23 NOTE — ED Notes (Signed)
Spoke to PA about pt's disposition as he is no longer suicidal and just wants to be able to sleep and the fact that none of his home medications have been ordered. She asked if [facility] could go ahead and see the pt.

## 2012-07-23 NOTE — ED Notes (Signed)
Pt alert and oriented x4. Respirations even and unlabored, bilateral symmetrical rise and fall of chest. Skin warm and dry. In no acute distress. Denies needs.   

## 2012-07-23 NOTE — ED Notes (Signed)
Called [facility], pt is on the list to be assessed but it will be awhile.

## 2012-07-23 NOTE — ED Provider Notes (Signed)
History     CSN: [id number]  Arrival date & time 07/23/12  1417   First MD Initiated Contact with Patient 07/23/12 1510      Chief Complaint  Patient presents with  . Suicidal    (Consider location/radiation/quality/duration/timing/severity/associated sxs/prior treatment) HPI  Patient is on home oxygen at baseline. Here for suicidal ideation with a plan  Sent to the emergency department from [facility] assisted living for thoughts of suicidal ideation. He started Celexa 3 weeks ago after being evaluated emergency department and being told that his chest pain was take to stress and anxiety. He says that his plan is to take a bunch of pills because he is just so frustrated, depressed and has anxiety. He began to tell me stories of his life going back approximately 25 years saying that his first wife was sick and then he committed himself, and 10 years ago psychiatrist told him he was no more crazy and he was does have a lot of plate. The patient appears to be trigger and feels like reminiscing a lot. But says yes I am suicidal and need help.  Past Medical History  Diagnosis Date  . Other congenital hamartoses, not elsewhere classified   . Family history of ischemic heart disease   . Family history of malignant neoplasm of gastrointestinal tract   . Allergic rhinitis, cause unspecified   . Other and unspecified hyperlipidemia   . Unspecified essential hypertension   . Obesity hypoventilation syndrome     failed cpap/bipap trial  . Hypothyroidism   . GERD (gastroesophageal reflux disease)   . Arthritis     osteoarthritis  . DEMENTIA     Alzheimer's, senile dementia  . Shortness of breath   . Cancer     renal carcinoma  . Sleep apnea     OXYGEN 24 HR/DAY-NO CPAP  . COPD (chronic obstructive pulmonary disease)     DR. Marcelyn Bruins FOLLOWS PT--PT USING OXYGEN 24 HRS A DAY  . COPD (chronic obstructive pulmonary disease)     PULMONARY CLEARANCE FOR LEFT RENAL CRYOABLATION ON  CHART FROM DR. CLANCE -07/26/11  . Coronary artery disease     CARDIAC CLEARANCE  OFFICE NOTE FOR LEFT CRYO ABLATION ON CHART FROM [name] -"RECENT STRESS TEST DID NOT SHOW ISCHEMIA" -"PRESUMED CAD DUE TO CORONARY CALCIFICATIONS"     Past Surgical History  Procedure Date  . Eye surgery     bilateral cataracts  . Joint replacement     bilateral knee replacements  . Carpal tunnel release     right  . Thyroidectomy   . Cholecystectomy 2009  . Knee arthroplasty     2005 LEFT   AND 2007 RIGHT--[name]  . Tumor removal 09/14/2011    Family History  Problem Relation Age of Onset  . Cancer Other     colon  . Heart disease Other     CAD, MI and Heart Attack    History  Substance Use Topics  . Smoking status: Never Smoker   . Smokeless tobacco: Never Used  . Alcohol Use: No      Review of Systems   Review of Systems  Gen: no weight loss, fevers, chills, night sweats  Eyes: no discharge or drainage, no occular pain or visual changes  Nose: no epistaxis or rhinorrhea  Mouth: no dental pain, no sore throat  Neck: no neck pain  Lungs:No wheezing, coughing or hemoptysis CV: no chest pain, palpitations, dependent edema or orthopnea  Abd: no abdominal  pain, nausea, vomiting  GU: no dysuria or gross hematuria  MSK:  No abnormalities  Neuro: no headache, no focal neurologic deficits  Skin: no abnormalities Psyche: depression and anxiety    Allergies  Review of patient's allergies indicates no known allergies.  Home Medications   Current Outpatient Rx  Name Route Sig Dispense Refill  . ACETAMINOPHEN 325 MG PO TABS Oral Take 325 mg by mouth every 6 (six) hours as needed. For pain/stiffiness.    . AMOXICILLIN 500 MG PO CAPS Oral Take 2,000 mg by mouth as needed. For use 1 hour before dental procedure.    . ASPIRIN 325 MG PO TABS Oral Take 325 mg by mouth daily.    Marland Kitchen CALCITRIOL 0.5 MCG PO CAPS Oral Take 0.5 mcg by mouth daily.    Marland Kitchen CALCIUM CARBONATE-VITAMIN D  500-200 MG-UNIT PO TABS Oral Take 1 tablet by mouth 2 (two) times daily.     . CEPHALEXIN 500 MG PO CAPS Oral Take 1,000 mg by mouth as needed. Taken 1 hour prior to surgery.    Marland Kitchen CITALOPRAM HYDROBROMIDE 20 MG PO TABS Oral Take 20 mg by mouth daily.    Marland Kitchen CLOPIDOGREL BISULFATE 75 MG PO TABS Oral Take 75 mg by mouth every morning.     . CYCLOSPORINE 0.05 % OP EMUL Both Eyes Place 1 drop into both eyes 2 (two) times daily.     Marland Kitchen FLUTICASONE PROPIONATE 50 MCG/ACT NA SUSP Nasal Place 1 spray into the nose 2 (two) times daily.     Marland Kitchen GABAPENTIN 100 MG PO CAPS Oral Take 100 mg by mouth 2 (two) times daily.    Marland Kitchen HYDROCODONE-ACETAMINOPHEN 5-500 MG PO TABS Oral Take 1 tablet by mouth 3 (three) times daily.    . IPRATROPIUM BROMIDE 0.02 % IN SOLN Nebulization Take 500 mcg by nebulization every 4 (four) hours as needed. For shortness of breath    . LEVOTHYROXINE SODIUM 200 MCG PO TABS Oral Take 200 mcg by mouth daily. TAKE WITH THE 25 MCG LEVOTHYROXINE    . LEVOTHYROXINE SODIUM 25 MCG PO TABS Oral Take 25 mcg by mouth daily. Take with 200 mcg to equal 225 mcg    . LORATADINE 10 MG PO TABS Oral Take 10 mg by mouth daily.    Marland Kitchen LORAZEPAM 0.5 MG PO TABS Oral Take 0.5 mg by mouth 2 (two) times daily as needed. For anxiety    . LOTEPREDNOL ETABONATE 0.5 % OP SUSP Both Eyes Place 1 drop into both eyes every morning.     Marland Kitchen MELATONIN 2.5-338 MG-MCG SL SUBL Sublingual Place 1 strip under the tongue at bedtime.    . MELOXICAM 7.5 MG PO TABS Oral Take 7.5 mg by mouth daily.    . METHYLCELLULOSE 1 % OP SOLN Both Eyes Place 1 drop into both eyes 2 (two) times daily.    Marland Kitchen METOPROLOL TARTRATE 25 MG PO TABS Oral Take 12.5 mg by mouth 2 (two) times daily. Hold for systolic blood pressure <100    . NYSTATIN 100000 UNIT/GM EX POWD Topical Apply topically 2 (two) times daily.     Marland Kitchen PANTOPRAZOLE SODIUM 40 MG PO TBEC Oral Take 40 mg by mouth daily.     Marland Kitchen PREGABALIN 75 MG PO CAPS Oral Take 75 mg by mouth 2 (two) times daily.    Marland Kitchen  METAMUCIL PO Oral Take 2 capsules by mouth daily.    Bernadette Hoit SODIUM 8.6-50 MG PO TABS Oral Take 1 tablet by mouth daily.     Marland Kitchen  SIMETHICONE 80 MG PO CHEW Oral Chew 80 mg by mouth 4 (four) times daily.    Marland Kitchen BAZA PROTECT EX Topical Apply 1 application topically 2 (two) times daily as needed. For skin irritation, applied to right buttock.    . SUCRALFATE 1 G PO TABS Oral Take 1 g by mouth 4 (four) times daily.    Marland Kitchen TAMSULOSIN HCL 0.4 MG PO CAPS Oral Take 0.4 mg by mouth daily. Hold if SBP < 100    . TRAMADOL HCL 50 MG PO TABS Oral Take 50 mg by mouth every 6 (six) hours as needed. FOR PAIN    . WHITE PETROLATUM GEL Topical Apply 1 application topically as needed. Applied to right inner buttock at bedtime after washing.    Marland Kitchen ZOLPIDEM TARTRATE 5 MG PO TABS Oral Take 5 mg by mouth at bedtime.      BP 116/58  Pulse 66  Temp 98.3 F (36.8 C) (Oral)  Resp 24  SpO2 100%  Physical Exam  Nursing note and vitals reviewed. Constitutional: He appears well-developed and well-nourished. No distress.  HENT:  Head: Normocephalic and atraumatic.  Eyes: Pupils are equal, round, and reactive to light.  Neck: Normal range of motion. Neck supple.  Cardiovascular: Normal rate and regular rhythm.   Pulmonary/Chest: Effort normal. No respiratory distress. He has no wheezes.       Pt on home oxygen  Abdominal: Soft.  Neurological: He is alert.  Skin: Skin is warm and dry.  Psychiatric: He exhibits a depressed mood. He expresses suicidal ideation. He expresses no homicidal ideation. He expresses no suicidal plans and no homicidal plans.    ED Course  Procedures (including critical care time)  Labs Reviewed  URINE RAPID DRUG SCREEN (HOSP PERFORMED) - Abnormal; Notable for the following:    Opiates POSITIVE (*)     All other components within normal limits  CBC WITH DIFFERENTIAL - Abnormal; Notable for the following:    RBC 3.88 (*)     Hemoglobin 12.3 (*)     HCT 37.0 (*)     All other  components within normal limits  COMPREHENSIVE METABOLIC PANEL - Abnormal; Notable for the following:    Glucose, Bld 115 (*)     Calcium 7.6 (*)     Albumin 3.1 (*)     GFR calc non Af Amer 62 (*)     GFR calc Af Amer 72 (*)     All other components within normal limits  SALICYLATE LEVEL - Abnormal; Notable for the following:    Salicylate Lvl <2.0 (*)     All other components within normal limits  ETHANOL  ACETAMINOPHEN LEVEL  URINALYSIS, ROUTINE W REFLEX MICROSCOPIC   No results found.   1. Depression   2. Suicidal ideation       MDM  Patient's medically cleared. Holding orders placed and asked to consulted.         Dorthula Matas, PA 07/23/12 [phone]

## 2012-07-23 NOTE — ED Notes (Addendum)
Pt said he will not kill himself. He just sometimes wakes up very tired and agitated because of not getting enough sleep as he is only sleeping about 5 hours a night. This has been an ongoing problem for the pt for some time.

## 2012-07-23 NOTE — ED Notes (Signed)
Pt requesting his Bible and Devotional book out of his locker and something for pain.

## 2012-07-24 MED ORDER — FLUTICASONE PROPIONATE 50 MCG/ACT NA SUSP
2.0000 | Freq: Every day | NASAL | Status: DC
  Administered 2012-07-24 – 2012-07-25 (×2): 2 via NASAL
  Filled 2012-07-24: qty 16

## 2012-07-24 NOTE — ED Notes (Signed)
2 pt belongings bags, 1 gray bag, 1 oxygen tank locked up in left side cabinet in room 5

## 2012-07-24 NOTE — ED Notes (Signed)
Pt is up to bathroom.

## 2012-07-24 NOTE — ED Notes (Signed)
Pt up to bathroom, his sheets and gown are being changed due to excessive sweat again.

## 2012-07-24 NOTE — ED Notes (Signed)
Lynnda Child at [facility] she is the [facility] over clinical and must be notified prior to pt's return to facility. [phone]. She called to check on pt's disposition, told her he is awaiting assessment by [facility] currently.

## 2012-07-24 NOTE — ED Notes (Signed)
Per tech, pt just laying in bed but not sleeping, he also goes to the bathroom about every two hours. At 0250 his bed linens had to be changed and he was put in a gown due to excessive sweat.

## 2012-07-24 NOTE — ED Notes (Signed)
Was up to bathroom with assistance a moment ago.

## 2012-07-24 NOTE — ED Provider Notes (Signed)
Date: 07/24/2012  Rate: 61  Rhythm: normal sinus rhythm  QRS Axis: normal  Intervals: normal  ST/T Wave abnormalities: normal  Conduction Disutrbances:none  Narrative Interpretation: Borderline R wave progression, nat leads  Old EKG Reviewed: none available and improved from Jun 10, 2012    Dorthula Matas, [place] 07/24/12 0107

## 2012-07-24 NOTE — ED Notes (Signed)
[facility] representative, Terri notified of sticky note.

## 2012-07-24 NOTE — ED Notes (Signed)
Please call HCPOA after determining possibility of evaluation of Psychiatric Physician during this visit. Pt will not be accepted at [facility] until this occurs and pt has no place to stay except [facility].  HCPOA: Brynda Rim  [phone]

## 2012-07-24 NOTE — ED Provider Notes (Signed)
Medical screening examination/treatment/procedure(s) were performed by non-physician practitioner and as supervising physician I was immediately available for consultation/collaboration.  Jala Dundon, MD 07/24/12 0000 

## 2012-07-24 NOTE — ED Notes (Signed)
Bed:WA25<BR> Expected date:<BR> Expected time:<BR> Means of arrival:<BR> Comments:<BR>

## 2012-07-24 NOTE — BH Assessment (Signed)
Assessment Note   [name] [name] is a 71 y.o. male sent to [facility] from [facility] assisted living facility voluntarily, endorsing SI and anxiety. Pt reports he has a history of depression and anxiety. He reports When asked why pt presents to the ED pt states due to emotions, frustration, depression, anxiety, stress, and tension. 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 He reports his second wife passed away 5 years ago and since that time he has been experiencing symptoms of depression. He states after her death he endorsed SI with a plan to shoot himself but talked to his preacher and sought treatment at [facility]. He reports he has had increasing anxiety over the past several nights thinking about his past. He reports he has been unable to sleep more than 2 hours in the past 5 days. He states when he can not sleep he becomes more anxious. He states he got to the point today that he wanted to kill himself because he "felt so uptight." He further states that he "has a hell of a plate of problems."   Pt denies HI, AHVH, and SA. He reports no current outpatient providers. He lives at [facility] and can return to live there after treatment. He states that his daughter [name] [name] is very supportive of him and his health care power of attorney but that he is his own guardian. He was cooperative during assessment and appears to be highly anxious.     Axis I: Anxiety Disorder NOS and Major Depression, Recurrent severe Axis II: Deferred Axis III:  Past Medical History  Diagnosis Date  . Other congenital hamartoses, not elsewhere classified   . Family history of ischemic heart disease   . Family history of malignant neoplasm of gastrointestinal tract   . Allergic rhinitis, cause unspecified   . Other and unspecified hyperlipidemia   . Unspecified essential hypertension   . Obesity hypoventilation syndrome     failed cpap/bipap  trial  . Hypothyroidism   . GERD (gastroesophageal reflux disease)   . Arthritis     osteoarthritis  . DEMENTIA     Alzheimer's, senile dementia  . Shortness of breath   . Cancer     renal carcinoma  . Sleep apnea     OXYGEN 24 HR/DAY-NO CPAP  . COPD (chronic obstructive pulmonary disease)     DR. Marcelyn Bruins FOLLOWS PT--PT USING OXYGEN 24 HRS A DAY  . COPD (chronic obstructive pulmonary disease)     PULMONARY CLEARANCE FOR LEFT RENAL CRYOABLATION ON CHART FROM DR. CLANCE -07/26/11  . Coronary artery disease     CARDIAC CLEARANCE  OFFICE NOTE FOR LEFT CRYO ABLATION ON CHART FROM [name] -"RECENT STRESS TEST DID NOT SHOW ISCHEMIA" -"PRESUMED CAD DUE TO CORONARY CALCIFICATIONS"    Axis IV: other psychosocial or environmental problems and problems with primary support group Axis V: 31-40 impairment in reality testing  Past Medical History:  Past Medical History  Diagnosis Date  . Other congenital hamartoses, not elsewhere classified   . Family history of ischemic heart disease   . Family history of malignant neoplasm of gastrointestinal tract   . Allergic rhinitis, cause unspecified   . Other and unspecified hyperlipidemia   . Unspecified essential hypertension   . Obesity hypoventilation syndrome     failed cpap/bipap trial  . Hypothyroidism   . GERD (gastroesophageal reflux disease)   . Arthritis     osteoarthritis  .  DEMENTIA     Alzheimer's, senile dementia  . Shortness of breath   . Cancer     renal carcinoma  . Sleep apnea     OXYGEN 24 HR/DAY-NO CPAP  . COPD (chronic obstructive pulmonary disease)     DR. Marcelyn Bruins FOLLOWS PT--PT USING OXYGEN 24 HRS A DAY  . COPD (chronic obstructive pulmonary disease)     PULMONARY CLEARANCE FOR LEFT RENAL CRYOABLATION ON CHART FROM DR. CLANCE -07/26/11  . Coronary artery disease     CARDIAC CLEARANCE  OFFICE NOTE FOR LEFT CRYO ABLATION ON CHART FROM [name] -"RECENT STRESS TEST DID NOT SHOW ISCHEMIA" -"PRESUMED CAD  DUE TO CORONARY CALCIFICATIONS"     Past Surgical History  Procedure Date  . Eye surgery     bilateral cataracts  . Joint replacement     bilateral knee replacements  . Carpal tunnel release     right  . Thyroidectomy   . Cholecystectomy 2009  . Knee arthroplasty     2005 LEFT   AND 2007 RIGHT--[name]  . Tumor removal 09/14/2011    Family History:  Family History  Problem Relation Age of Onset  . Cancer Other     colon  . Heart disease Other     CAD, MI and Heart Attack    Social History:  reports that he has never smoked. He has never used smokeless tobacco. He reports that he does not drink alcohol or use illicit drugs.  Additional Social History:  Alcohol / Drug Use History of alcohol / drug use?: No history of alcohol / drug abuse  CIWA: CIWA-Ar BP: 108/73 mmHg Pulse Rate: 112  COWS:    Allergies: No Known Allergies  Home Medications:  (Not in a hospital admission)  OB/GYN Status:  No LMP for male patient.  General Assessment Data Location of Assessment: [facility] Living Arrangements: Other (Comment) ([facility]) Can pt return to current living arrangement?: Yes Admission Status: Voluntary Is patient capable of signing voluntary admission?: No Transfer from: Acute Hospital Referral Source: MD  Education Status Is patient currently in school?: No Highest grade of school patient has completed: graduate school  Risk to self Suicidal Ideation: Yes-Currently Present Suicidal Intent: Yes-Currently Present Is patient at risk for suicide?: Yes Suicidal Plan?: No Access to Means: No What has been your use of drugs/alcohol within the last 12 months?: denies Previous Attempts/Gestures: Yes How many times?: 1  Other Self Harm Risks: none Triggers for Past Attempts: None known Intentional Self Injurious Behavior: None Family Suicide History: No Recent stressful life event(s): Loss (Comment);Other (Comment) (lost wife 5 years ago) Persecutory  voices/beliefs?: No Depression: Yes Depression Symptoms: Despondent;Tearfulness;Insomnia Substance abuse history and/or treatment for substance abuse?: No Suicide prevention information given to non-admitted patients: Not applicable  Risk to Others Homicidal Ideation: No Thoughts of Harm to Others: No Current Homicidal Intent: No Current Homicidal Plan: No Access to Homicidal Means: No Identified Victim: none History of harm to others?: No Assessment of Violence: None Noted Violent Behavior Description: cooperative Does patient have access to weapons?: No Criminal Charges Pending?: No Does patient have a court date: No  Psychosis Hallucinations: None noted Delusions: None noted  Mental Status Report Appear/Hygiene: Disheveled Eye Contact: Good Motor Activity: Unremarkable Speech: Logical/coherent Level of Consciousness: Alert Mood: Depressed;Anxious Affect: Appropriate to circumstance Anxiety Level: Severe Thought Processes: Coherent;Relevant Judgement: Unimpaired Orientation: Person;Place;Time;Situation Obsessive Compulsive Thoughts/Behaviors: None  Cognitive Functioning Concentration: Normal Memory: Recent Intact;Remote Intact IQ: Average Insight: Good Impulse Control:  Fair Appetite: Fair Weight Loss: 0  Weight Gain: 0  Sleep: Decreased Total Hours of Sleep: 2  (2 hours in the past 5 days) Vegetative Symptoms: None  ADLScreening [facility] Assessment Services) Patient's cognitive ability adequate to safely complete daily activities?: Yes Patient able to express need for assistance with ADLs?: Yes Independently performs ADLs?: Yes (appropriate for developmental age)  Abuse/Neglect [facility]) Physical Abuse: Denies Verbal Abuse: Denies Sexual Abuse: Denies  Prior Inpatient Therapy Prior Inpatient Therapy: Yes Prior Therapy Dates: 2008 Prior Therapy Facilty/Provider(s): [facility] Reason for Treatment: depression and SI  Prior Outpatient Therapy Prior  Outpatient Therapy: No Prior Therapy Dates: na Prior Therapy Facilty/Provider(s): na Reason for Treatment: na  ADL Screening (condition at time of admission) Patient's cognitive ability adequate to safely complete daily activities?: Yes Patient able to express need for assistance with ADLs?: Yes Independently performs ADLs?: Yes (appropriate for developmental age) Weakness of Legs: None Weakness of Arms/Hands: None  Home Assistive Devices/Equipment Home Assistive Devices/Equipment: None    Abuse/Neglect Assessment (Assessment to be complete while patient is alone) Physical Abuse: Denies Verbal Abuse: Denies Sexual Abuse: Denies Exploitation of patient/patient's resources: Denies Self-Neglect: Denies Values / Beliefs Cultural Requests During Hospitalization: None Spiritual Requests During Hospitalization: None   Advance Directives (For Healthcare) Advance Directive: Patient has advance directive, copy not in chart Type of Advance Directive: Healthcare Power of Attorney Pre-existing out of facility DNR order (yellow form or pink MOST form): No Nutrition Screen- [facility] Adult/[facility] Patient's home diet: Regular Have you recently lost weight without trying?: No Have you been eating poorly because of a decreased appetite?: No Malnutrition Screening Tool Score: 0   Additional Information 1:1 In Past 12 Months?: No CIRT Risk: No Elopement Risk: No Does patient have medical clearance?: Yes     Disposition:  Disposition Disposition of Patient: Referred to;Inpatient treatment program Type of inpatient treatment program: Adult Patient referred to: Other (Comment) [facility])  On Site Evaluation by:   Reviewed with Physician:     Georgina Quint A 07/24/2012 10:04 PM

## 2012-07-24 NOTE — ED Notes (Signed)
[facility] team at bedside.  

## 2012-07-25 NOTE — BHH Counselor (Signed)
[facility] accepted [facility] by Dr. Melburn Popper. Patient has signed the voluntary form and the form was faxed to [facility] for their records.  Patients nurse informed to call report #[phone]. Dr. Effie Shy  also made aware of patient's acceptance and agreed to discharge patient to [facility] for inpatient treatment.

## 2012-07-25 NOTE — ED Provider Notes (Signed)
He has been accepted at [facility]. He continues to medications arranged to help his sleeplessness.  Flint Melter, MD 07/25/12 [phone]

## 2012-07-26 NOTE — ED Provider Notes (Signed)
Medical screening examination/treatment/procedure(s) were performed by non-physician practitioner and as supervising physician I was immediately available for consultation/collaboration.  Cheri Guppy, MD 07/26/12 [phone]

## 2012-08-10 ENCOUNTER — Ambulatory Visit: Payer: Medicare Other | Admitting: Pulmonary Disease

## 2013-05-26 ENCOUNTER — Other Ambulatory Visit (HOSPITAL_COMMUNITY): Payer: Self-pay | Admitting: Interventional Radiology

## 2013-05-26 DIAGNOSIS — N2889 Other specified disorders of kidney and ureter: Secondary | ICD-10-CM

## 2013-08-12 ENCOUNTER — Encounter: Payer: Self-pay | Admitting: Emergency Medicine

## 2013-09-30 ENCOUNTER — Telehealth: Payer: Self-pay | Admitting: Emergency Medicine

## 2013-09-30 NOTE — Telephone Encounter (Signed)
PT CALLED TO SAY THAT HE CAN NOT GET A RIDE TO [place] TO F/U W/DR Fredia Sorrow.  [place] AND ALL [place] DRS [place] BE THERE.   THE FACILITY WHERE HE RESIDES SAID ITS TOO FAR TO DRIVE HIM HERE.  HE IS SEEING [facility].    TOLD PT I WOULD LET DR Fredia Sorrow KNOW.

## 2013-10-07 ENCOUNTER — Telehealth: Payer: Self-pay | Admitting: Emergency Medicine

## 2013-10-07 NOTE — Telephone Encounter (Signed)
CALLED PT TO MAKE HIM AWARE THAT DR Fredia Sorrow STATED THAT WE CAN FAX OUR RECORDS TO HIS U[facility] DR AT [facility] AND HE CAN HANDLE HIS CARE THERE (SINCE HE CAN NOT MAKE IT BACK TO [place]).  FAXING HIS CHART TO DR [facility] AT [phone]

## 2014-12-14 ENCOUNTER — Emergency Department (HOSPITAL_COMMUNITY): Payer: Medicare Other

## 2014-12-14 ENCOUNTER — Encounter (HOSPITAL_COMMUNITY): Payer: Self-pay | Admitting: *Deleted

## 2014-12-14 ENCOUNTER — Emergency Department (HOSPITAL_COMMUNITY)
Admission: EM | Admit: 2014-12-14 | Discharge: 2014-12-15 | Disposition: A | Discharge status: Home / Self Care | Payer: Medicare Other | Attending: Emergency Medicine | Admitting: Emergency Medicine

## 2014-12-14 DIAGNOSIS — I2510 Atherosclerotic heart disease of native coronary artery without angina pectoris: Secondary | ICD-10-CM | POA: Diagnosis not present

## 2014-12-14 DIAGNOSIS — J189 Pneumonia, unspecified organism: Secondary | ICD-10-CM

## 2014-12-14 DIAGNOSIS — Z7902 Long term (current) use of antithrombotics/antiplatelets: Secondary | ICD-10-CM | POA: Insufficient documentation

## 2014-12-14 DIAGNOSIS — J159 Unspecified bacterial pneumonia: Secondary | ICD-10-CM | POA: Diagnosis not present

## 2014-12-14 DIAGNOSIS — Z792 Long term (current) use of antibiotics: Secondary | ICD-10-CM | POA: Insufficient documentation

## 2014-12-14 DIAGNOSIS — Z7982 Long term (current) use of aspirin: Secondary | ICD-10-CM | POA: Diagnosis not present

## 2014-12-14 DIAGNOSIS — I10 Essential (primary) hypertension: Secondary | ICD-10-CM | POA: Insufficient documentation

## 2014-12-14 DIAGNOSIS — R0789 Other chest pain: Secondary | ICD-10-CM

## 2014-12-14 DIAGNOSIS — R079 Chest pain, unspecified: Secondary | ICD-10-CM | POA: Diagnosis present

## 2014-12-14 DIAGNOSIS — F0280 Dementia in other diseases classified elsewhere without behavioral disturbance: Secondary | ICD-10-CM | POA: Insufficient documentation

## 2014-12-14 DIAGNOSIS — Z791 Long term (current) use of non-steroidal anti-inflammatories (NSAID): Secondary | ICD-10-CM | POA: Insufficient documentation

## 2014-12-14 DIAGNOSIS — J441 Chronic obstructive pulmonary disease with (acute) exacerbation: Secondary | ICD-10-CM | POA: Diagnosis not present

## 2014-12-14 DIAGNOSIS — M1990 Unspecified osteoarthritis, unspecified site: Secondary | ICD-10-CM | POA: Diagnosis not present

## 2014-12-14 DIAGNOSIS — E039 Hypothyroidism, unspecified: Secondary | ICD-10-CM | POA: Diagnosis not present

## 2014-12-14 DIAGNOSIS — Z87738 Personal history of other specified (corrected) congenital malformations of digestive system: Secondary | ICD-10-CM | POA: Insufficient documentation

## 2014-12-14 DIAGNOSIS — K219 Gastro-esophageal reflux disease without esophagitis: Secondary | ICD-10-CM | POA: Diagnosis not present

## 2014-12-14 DIAGNOSIS — Z79899 Other long term (current) drug therapy: Secondary | ICD-10-CM | POA: Insufficient documentation

## 2014-12-14 DIAGNOSIS — G309 Alzheimer's disease, unspecified: Secondary | ICD-10-CM | POA: Diagnosis not present

## 2014-12-14 DIAGNOSIS — Z85528 Personal history of other malignant neoplasm of kidney: Secondary | ICD-10-CM | POA: Diagnosis not present

## 2014-12-14 DIAGNOSIS — E669 Obesity, unspecified: Secondary | ICD-10-CM | POA: Diagnosis not present

## 2014-12-14 LAB — CBC WITH DIFFERENTIAL/PLATELET
BASOS ABS: 0 10*3/uL (ref 0.0–0.1)
BASOS PCT: 1 % (ref 0–1)
Eosinophils Absolute: 0.2 10*3/uL (ref 0.0–0.7)
Eosinophils Relative: 3 % (ref 0–5)
HCT: 35.8 % — ABNORMAL LOW (ref 39.0–52.0)
HEMOGLOBIN: 11.6 g/dL — AB (ref 13.0–17.0)
LYMPHS PCT: 18 % (ref 12–46)
Lymphs Abs: 1 10*3/uL (ref 0.7–4.0)
MCH: 32.8 pg (ref 26.0–34.0)
MCHC: 32.4 g/dL (ref 30.0–36.0)
MCV: 101.1 fL — ABNORMAL HIGH (ref 78.0–100.0)
Monocytes Absolute: 0.4 10*3/uL (ref 0.1–1.0)
Monocytes Relative: 8 % (ref 3–12)
NEUTROS ABS: 4.1 10*3/uL (ref 1.7–7.7)
NEUTROS PCT: 70 % (ref 43–77)
Platelets: 155 10*3/uL (ref 150–400)
RBC: 3.54 MIL/uL — ABNORMAL LOW (ref 4.22–5.81)
RDW: 13.6 % (ref 11.5–15.5)
WBC: 5.8 10*3/uL (ref 4.0–10.5)

## 2014-12-14 LAB — COMPREHENSIVE METABOLIC PANEL
ALBUMIN: 3.2 g/dL — AB (ref 3.5–5.2)
ALK PHOS: 83 U/L (ref 39–117)
ALT: 11 U/L (ref 0–53)
ANION GAP: 8 (ref 5–15)
AST: 20 U/L (ref 0–37)
BUN: 22 mg/dL (ref 6–23)
CHLORIDE: 102 mmol/L (ref 96–112)
CO2: 28 mmol/L (ref 19–32)
Calcium: 8.1 mg/dL — ABNORMAL LOW (ref 8.4–10.5)
Creatinine, Ser: 1.47 mg/dL — ABNORMAL HIGH (ref 0.50–1.35)
GFR calc Af Amer: 52 mL/min — ABNORMAL LOW (ref >90)
GFR calc non Af Amer: 45 mL/min — ABNORMAL LOW (ref >90)
Glucose, Bld: 107 mg/dL — ABNORMAL HIGH (ref 70–99)
Potassium: 4.1 mmol/L (ref 3.5–5.1)
Sodium: 138 mmol/L (ref 135–145)
TOTAL PROTEIN: 5.6 g/dL — AB (ref 6.0–8.3)
Total Bilirubin: 0.5 mg/dL (ref 0.3–1.2)

## 2014-12-14 LAB — I-STAT CHEM 8, ED
BUN: 26 mg/dL — ABNORMAL HIGH (ref 6–23)
CHLORIDE: 99 mmol/L (ref 96–112)
Calcium, Ion: 0.99 mmol/L — ABNORMAL LOW (ref 1.13–1.30)
Creatinine, Ser: 1.4 mg/dL — ABNORMAL HIGH (ref 0.50–1.35)
GLUCOSE: 106 mg/dL — AB (ref 70–99)
HEMATOCRIT: 36 % — AB (ref 39.0–52.0)
Hemoglobin: 12.2 g/dL — ABNORMAL LOW (ref 13.0–17.0)
POTASSIUM: 3.9 mmol/L (ref 3.5–5.1)
SODIUM: 140 mmol/L (ref 135–145)
TCO2: 27 mmol/L (ref 0–100)

## 2014-12-14 LAB — PROTIME-INR
INR: 1.02 (ref 0.00–1.49)
Prothrombin Time: 13.5 seconds (ref 11.6–15.2)

## 2014-12-14 LAB — TROPONIN I: Troponin I: <0.03 ng/mL (ref <0.031)

## 2014-12-14 LAB — I-STAT TROPONIN, ED: Troponin i, poc: 0 ng/mL (ref 0.00–0.08)

## 2014-12-14 LAB — BRAIN NATRIURETIC PEPTIDE: B NATRIURETIC PEPTIDE 5: 106.4 pg/mL — AB (ref 0.0–100.0)

## 2014-12-14 MED ORDER — LEVOFLOXACIN 500 MG PO TABS
500.0000 mg | ORAL_TABLET | Freq: Once | ORAL | Status: AC
  Administered 2014-12-14: 500 mg via ORAL
  Filled 2014-12-14: qty 1

## 2014-12-14 MED ORDER — LEVOFLOXACIN 500 MG PO TABS: 500.0000 mg | ORAL_TABLET | Freq: Every day | ORAL | Status: DC

## 2014-12-14 NOTE — Discharge Instructions (Signed)
Please call your doctor for a followup appointment within 24-48 hours. When you talk to your doctor please let them know that you were seen in the emergency department and have them acquire all of your records so that they can discuss the findings with you and formulate a treatment plan to fully care for your new and ongoing problems. Please call and set-up an appointment with your primary care provider - you need to be re-assessed within 48 hours, before the weekend Please rest and stay hydrated Please avoid any physical or strenuous activity  Please take medications as prescribed and on a full stomach  Please continue to monitor symptoms closely and if symptoms are to worsen or change (fever greater than 101, chills, sweating, nausea, vomiting, chest pain, shortness of breathe, difficulty breathing, weakness, numbness, tingling, worsening or changes to pain pattern, increased use of oxygen therapy, coughing up blood) please report back to the Emergency Department immediately.   Pneumonia Pneumonia is an infection of the lungs.  CAUSES Pneumonia may be caused by bacteria or a virus. Usually, these infections are caused by breathing infectious particles into the lungs (respiratory tract). SIGNS AND SYMPTOMS   Cough.  Fever.  Chest pain.  Increased rate of breathing.  Wheezing.  Mucus production. DIAGNOSIS  If you have the common symptoms of pneumonia, your health care provider will typically confirm the diagnosis with a chest X-ray. The X-ray will show an abnormality in the lung (pulmonary infiltrate) if you have pneumonia. Other tests of your blood, urine, or sputum may be done to find the specific cause of your pneumonia. Your health care provider may also do tests (blood gases or pulse oximetry) to see how well your lungs are working. TREATMENT  Some forms of pneumonia may be spread to other people when you cough or sneeze. You may be asked to wear a mask before and during your exam.  Pneumonia that is caused by bacteria is treated with antibiotic medicine. Pneumonia that is caused by the influenza virus may be treated with an antiviral medicine. Most other viral infections must run their course. These infections will not respond to antibiotics.  HOME CARE INSTRUCTIONS   Cough suppressants may be used if you are losing too much rest. However, coughing protects you by clearing your lungs. You should avoid using cough suppressants if you can.  Your health care provider may have prescribed medicine if he or she thinks your pneumonia is caused by bacteria or influenza. Finish your medicine even if you start to feel better.  Your health care provider may also prescribe an expectorant. This loosens the mucus to be coughed up.  Take medicines only as directed by your health care provider.  Do not smoke. Smoking is a common cause of bronchitis and can contribute to pneumonia. If you are a smoker and continue to smoke, your cough may last several weeks after your pneumonia has cleared.  A cold steam vaporizer or humidifier in your room or home may help loosen mucus.  Coughing is often worse at night. Sleeping in a semi-upright position in a recliner or using a couple pillows under your head will help with this.  Get rest as you feel it is needed. Your body will usually let you know when you need to rest. PREVENTION A pneumococcal shot (vaccine) is available to prevent a common bacterial cause of pneumonia. This is usually suggested for:  People over 67 years old.  Patients on chemotherapy.  People with chronic lung problems, such  as bronchitis or emphysema.  People with immune system problems. If you are over 65 or have a high risk condition, you may receive the pneumococcal vaccine if you have not received it before. In some countries, a routine influenza vaccine is also recommended. This vaccine can help prevent some cases of pneumonia.You may be offered the influenza  vaccine as part of your care. If you smoke, it is time to quit. You may receive instructions on how to stop smoking. Your health care provider can provide medicines and counseling to help you quit. SEEK MEDICAL CARE IF: You have a fever. SEEK IMMEDIATE MEDICAL CARE IF:   Your illness becomes worse. This is especially true if you are elderly or weakened from any other disease.  You cannot control your cough with suppressants and are losing sleep.  You begin coughing up blood.  You develop pain which is getting worse or is uncontrolled with medicines.  Any of the symptoms which initially brought you in for treatment are getting worse rather than better.  You develop shortness of breath or chest pain. MAKE SURE YOU:   Understand these instructions.  Will watch your condition.  Will get help right away if you are not doing well or get worse. Document Released: 10/07/2005 Document Revised: 02/21/2014 Document Reviewed: 12/27/2010 [facility] Patient Information 2015 [place], [place]. This information is not intended to replace advice given to you by your health care provider. Make sure you discuss any questions you have with your health care provider.

## 2014-12-14 NOTE — ED Notes (Addendum)
Pt arrives from assisted living via [facility]. Pt has c/o intermittent chest pain x 4 days bilaterally. Pt denies n/v, radiation. Pt reports dizziness with coughing and sometimes with exertion. Pt also has c/o nonproductive cough x1 week. Pt reports his chest pain is much worse when coughing. Pt reports he wears O2 at night, but not during the day. Pt was at 94% on RA and is currently at 97% on [place].

## 2014-12-14 NOTE — ED Notes (Signed)
PT [name] [name]

## 2014-12-14 NOTE — ED Provider Notes (Signed)
CSN: [id number]     Arrival date & time 12/14/14  1846 History   First MD Initiated Contact with Patient 12/14/14 1850     Chief Complaint  Patient presents with  . Chest Pain  . Cough     (Consider location/radiation/quality/duration/timing/severity/associated sxs/prior Treatment) The history is provided by the patient. No language interpreter was used.  [name] [name] is a 74 y/o M with PMHx of congenital hamartoses, ischemic heart disease, allergic rhinitis, GERD, COPD, arthritis, dementia, history of cancer presenting to the ED with chest pain and cough. Patient reported that the past week he's been having soreness to the chest wall localize mainly on the left side as well as left ribs and shoulder. Patient reports that the discomfort has been constant today. Stated he's been having a dry cough associated with nasal congestion. Stated that he lives at a assisted living facility with many individuals with cough. Reported that he does use oxygen therapy as needed, mainly at bedtime. Denied fever, chills, changes to appetite, nausea, vomiting, diarrhea, abdominal pain, melena, hematochezia, fainting, leg swelling. PCP Dr. Fredderick Phenix  Past Medical History  Diagnosis Date  . Other congenital hamartoses, not elsewhere classified   . Family history of ischemic heart disease   . Family history of malignant neoplasm of gastrointestinal tract   . Allergic rhinitis, cause unspecified   . Other and unspecified hyperlipidemia   . Unspecified essential hypertension   . Obesity hypoventilation syndrome     failed cpap/bipap trial  . Hypothyroidism   . GERD (gastroesophageal reflux disease)   . Arthritis     osteoarthritis  . DEMENTIA     Alzheimer's, senile dementia  . Shortness of breath   . Cancer     renal carcinoma  . Sleep apnea     OXYGEN 24 HR/DAY-NO CPAP  . COPD (chronic obstructive pulmonary disease)     [name] FOLLOWS PT--PT USING OXYGEN 24 HRS A DAY  . COPD (chronic  obstructive pulmonary disease)     PULMONARY CLEARANCE FOR LEFT RENAL CRYOABLATION ON CHART FROM DR. CLANCE -07/26/11  . Coronary artery disease     CARDIAC CLEARANCE  OFFICE NOTE FOR LEFT CRYO ABLATION ON CHART FROM [name] -"RECENT STRESS TEST DID NOT SHOW ISCHEMIA" -"PRESUMED CAD DUE TO CORONARY CALCIFICATIONS"    Past Surgical History  Procedure Laterality Date  . Eye surgery      bilateral cataracts  . Joint replacement      bilateral knee replacements  . Carpal tunnel release      right  . Thyroidectomy    . Cholecystectomy  2009  . Knee arthroplasty      2005 LEFT   AND 2007 RIGHT--[name]  . Tumor removal  09/14/2011   Family History  Problem Relation Age of Onset  . Cancer Other     colon  . Heart disease Other     CAD, MI and Heart Attack   History  Substance Use Topics  . Smoking status: Never Smoker   . Smokeless tobacco: Never Used  . Alcohol Use: No    Review of Systems  Constitutional: Negative for fever and chills.  Respiratory: Positive for cough, chest tightness and shortness of breath.   Cardiovascular: Positive for chest pain.  Gastrointestinal: Negative for nausea, vomiting and abdominal pain.  Musculoskeletal: Negative for back pain, neck pain and neck stiffness.  Neurological: Negative for dizziness, weakness and headaches.      Allergies  Review of patient's allergies indicates  no known allergies.  Home Medications   Prior to Admission medications   Medication Sig Start Date End Date Taking? Authorizing Provider  acetaminophen (TYLENOL) 325 MG tablet Take 325 mg by mouth every 6 (six) hours as needed. For pain/stiffiness.    Historical Provider, MD  amoxicillin (AMOXIL) 500 MG capsule Take 2,000 mg by mouth as needed. For use 1 hour before dental procedure.    Historical Provider, MD  aspirin 325 MG tablet Take 325 mg by mouth daily.    Historical Provider, MD  calcitRIOL (ROCALTROL) 0.5 MCG capsule Take 0.5 mcg by mouth daily.     Historical Provider, MD  calcium-vitamin D (OSCAL WITH D) 500-200 MG-UNIT per tablet Take 1 tablet by mouth 2 (two) times daily.     Historical Provider, MD  cephALEXin (KEFLEX) 500 MG capsule Take 1,000 mg by mouth as needed. Taken 1 hour prior to surgery.    Historical Provider, MD  citalopram (CELEXA) 20 MG tablet Take 20 mg by mouth daily.    Historical Provider, MD  clopidogrel (PLAVIX) 75 MG tablet Take 75 mg by mouth every morning.     Historical Provider, MD  cycloSPORINE (RESTASIS) 0.05 % ophthalmic emulsion Place 1 drop into both eyes 2 (two) times daily.     Historical Provider, MD  fluticasone (FLONASE) 50 MCG/ACT nasal spray Place 1 spray into the nose 2 (two) times daily.     Historical Provider, MD  gabapentin (NEURONTIN) 100 MG capsule Take 100 mg by mouth 2 (two) times daily.    Historical Provider, MD  HYDROcodone-acetaminophen (VICODIN) 5-500 MG per tablet Take 1 tablet by mouth 3 (three) times daily.    Historical Provider, MD  ipratropium (ATROVENT) 0.02 % nebulizer solution Take 500 mcg by nebulization every 4 (four) hours as needed. For shortness of breath    Historical Provider, MD  levofloxacin (LEVAQUIN) 500 MG tablet Take 1 tablet (500 mg total) by mouth daily. 12/14/14   Sharaine Delange, PA-C  levothyroxine (SYNTHROID, LEVOTHROID) 200 MCG tablet Take 200 mcg by mouth daily. TAKE WITH THE 25 MCG LEVOTHYROXINE    Historical Provider, MD  levothyroxine (SYNTHROID, LEVOTHROID) 25 MCG tablet Take 25 mcg by mouth daily. Take with 200 mcg to equal 225 mcg    Historical Provider, MD  loratadine (CLARITIN) 10 MG tablet Take 10 mg by mouth daily.    Historical Provider, MD  LORazepam (ATIVAN) 0.5 MG tablet Take 0.5 mg by mouth 2 (two) times daily as needed. For anxiety    Historical Provider, MD  loteprednol (LOTEMAX) 0.5 % ophthalmic suspension Place 1 drop into both eyes every morning.     Historical Provider, MD  Melatonin 2.5-338 MG-MCG SUBL Place 1 strip under the tongue at  bedtime.    Historical Provider, MD  meloxicam (MOBIC) 7.5 MG tablet Take 7.5 mg by mouth daily.    Historical Provider, MD  methylcellulose (ARTIFICIAL TEARS) 1 % ophthalmic solution Place 1 drop into both eyes 2 (two) times daily.    Historical Provider, MD  metoprolol tartrate (LOPRESSOR) 25 MG tablet Take 12.5 mg by mouth 2 (two) times daily. Hold for systolic blood pressure <626    Historical Provider, MD  nystatin (MYCOSTATIN/NYSTOP) 100000 UNIT/GM POWD Apply topically 2 (two) times daily.     Historical Provider, MD  pantoprazole (PROTONIX) 40 MG tablet Take 40 mg by mouth daily.     Historical Provider, MD  pregabalin (LYRICA) 75 MG capsule Take 75 mg by mouth 2 (two) times daily.  Historical Provider, MD  Psyllium (METAMUCIL PO) Take 2 capsules by mouth daily.    Historical Provider, MD  senna-docusate (SENOKOT-S) 8.6-50 MG per tablet Take 1 tablet by mouth daily.     Historical Provider, MD  simethicone (GAS RELIEF) 80 MG chewable tablet Chew 80 mg by mouth 4 (four) times daily.    Historical Provider, MD  Skin Protectants, Misc. (BAZA PROTECT EX) Apply 1 application topically 2 (two) times daily as needed. For skin irritation, applied to right buttock.    Historical Provider, MD  sucralfate (CARAFATE) 1 G tablet Take 1 g by mouth 4 (four) times daily.    Historical Provider, MD  Tamsulosin HCl (FLOMAX) 0.4 MG CAPS Take 0.4 mg by mouth daily. Hold if SBP < 100    Historical Provider, MD  traMADol (ULTRAM) 50 MG tablet Take 50 mg by mouth every 6 (six) hours as needed. FOR PAIN 01/18/12   Jonetta Osgood, MD  white petrolatum (VASELINE) GEL Apply 1 application topically as needed. Applied to right inner buttock at bedtime after washing.    Historical Provider, MD  zolpidem (AMBIEN) 5 MG tablet Take 5 mg by mouth at bedtime.    Historical Provider, MD   BP 100/59 mmHg  Pulse 59  Temp(Src) 98.1 F (36.7 C) (Oral)  Resp 16  Ht 6\' 3"  (1.905 m)  Wt 280 lb (127.007 kg)  BMI 35.00 kg/m2   SpO2 98% Physical Exam  Constitutional: He is oriented to person, place, and time. He appears well-developed and well-nourished. No distress.  HENT:  Head: Normocephalic and atraumatic.  Mouth/Throat: Oropharynx is clear and moist. No oropharyngeal exudate.  Eyes: Conjunctivae and EOM are normal. Pupils are equal, round, and reactive to light. Right eye exhibits no discharge. Left eye exhibits no discharge.  Neck: Normal range of motion. Neck supple. No tracheal deviation present.  Cardiovascular: Normal rate, regular rhythm and normal heart sounds.  Exam reveals no friction rub.   No murmur heard. Pulmonary/Chest: Effort normal and breath sounds normal. No respiratory distress. He has no wheezes. He has no rales. He exhibits tenderness (Chest pain is reproducible upon palpation to the chest wall).    Musculoskeletal: Normal range of motion.  Lymphadenopathy:    He has no cervical adenopathy.  Neurological: He is alert and oriented to person, place, and time.  Skin: Skin is warm and dry. No rash noted. He is not diaphoretic. No erythema.  Psychiatric: He has a normal mood and affect. His behavior is normal. Thought content normal.  Nursing note and vitals reviewed.   ED Course  Procedures (including critical care time)  Results for orders placed or performed during the hospital encounter of 12/14/14  CBC with Differential/Platelet  Result Value Ref Range   WBC 5.8 4.0 - 10.5 K/uL   RBC 3.54 (L) 4.22 - 5.81 MIL/uL   Hemoglobin 11.6 (L) 13.0 - 17.0 g/dL   HCT 35.8 (L) 39.0 - 52.0 %   MCV 101.1 (H) 78.0 - 100.0 fL   MCH 32.8 26.0 - 34.0 pg   MCHC 32.4 30.0 - 36.0 g/dL   RDW 13.6 11.5 - 15.5 %   Platelets 155 150 - 400 K/uL   Neutrophils Relative % 70 43 - 77 %   Neutro Abs 4.1 1.7 - 7.7 K/uL   Lymphocytes Relative 18 12 - 46 %   Lymphs Abs 1.0 0.7 - 4.0 K/uL   Monocytes Relative 8 3 - 12 %   Monocytes Absolute 0.4 0.1 -  1.0 K/uL   Eosinophils Relative 3 0 - 5 %    Eosinophils Absolute 0.2 0.0 - 0.7 K/uL   Basophils Relative 1 0 - 1 %   Basophils Absolute 0.0 0.0 - 0.1 K/uL  Comprehensive metabolic panel  Result Value Ref Range   Sodium 138 135 - 145 mmol/L   Potassium 4.1 3.5 - 5.1 mmol/L   Chloride 102 96 - 112 mmol/L   CO2 28 19 - 32 mmol/L   Glucose, Bld 107 (H) 70 - 99 mg/dL   BUN 22 6 - 23 mg/dL   Creatinine, Ser 1.47 (H) 0.50 - 1.35 mg/dL   Calcium 8.1 (L) 8.4 - 10.5 mg/dL   Total Protein 5.6 (L) 6.0 - 8.3 g/dL   Albumin 3.2 (L) 3.5 - 5.2 g/dL   AST 20 0 - 37 U/L   ALT 11 0 - 53 U/L   Alkaline Phosphatase 83 39 - 117 U/L   Total Bilirubin 0.5 0.3 - 1.2 mg/dL   GFR calc non Af Amer 45 (L) >90 mL/min   GFR calc Af Amer 52 (L) >90 mL/min   Anion gap 8 5 - 15  Troponin I  Result Value Ref Range   Troponin I <0.03 <0.031 ng/mL  Brain natriuretic peptide  Result Value Ref Range   B Natriuretic Peptide 106.4 (H) 0.0 - 100.0 pg/mL  Protime-INR  Result Value Ref Range   Prothrombin Time 13.5 11.6 - 15.2 seconds   INR 1.02 0.00 - 1.49  Troponin I  Result Value Ref Range   Troponin I <0.03 <0.031 ng/mL  I-stat chem 8, ed  Result Value Ref Range   Sodium 140 135 - 145 mmol/L   Potassium 3.9 3.5 - 5.1 mmol/L   Chloride 99 96 - 112 mmol/L   BUN 26 (H) 6 - 23 mg/dL   Creatinine, Ser 1.40 (H) 0.50 - 1.35 mg/dL   Glucose, Bld 106 (H) 70 - 99 mg/dL   Calcium, Ion 0.99 (L) 1.13 - 1.30 mmol/L   TCO2 27 0 - 100 mmol/L   Hemoglobin 12.2 (L) 13.0 - 17.0 g/dL   HCT 36.0 (L) 39.0 - 52.0 %  I-stat troponin, ED  Result Value Ref Range   Troponin i, poc 0.00 0.00 - 0.08 ng/mL   Comment 3          I-stat troponin, ED  Result Value Ref Range   Troponin i, poc 0.00 0.00 - 0.08 ng/mL   Comment 3            Labs Review Labs Reviewed  CBC WITH DIFFERENTIAL/PLATELET - Abnormal; Notable for the following:    RBC 3.54 (*)    Hemoglobin 11.6 (*)    HCT 35.8 (*)    MCV 101.1 (*)    All other components within normal limits  COMPREHENSIVE  METABOLIC PANEL - Abnormal; Notable for the following:    Glucose, Bld 107 (*)    Creatinine, Ser 1.47 (*)    Calcium 8.1 (*)    Total Protein 5.6 (*)    Albumin 3.2 (*)    GFR calc non Af Amer 45 (*)    GFR calc Af Amer 52 (*)    All other components within normal limits  BRAIN NATRIURETIC PEPTIDE - Abnormal; Notable for the following:    B Natriuretic Peptide 106.4 (*)    All other components within normal limits  I-STAT CHEM 8, ED - Abnormal; Notable for the following:    BUN 26 (*)  Creatinine, Ser 1.40 (*)    Glucose, Bld 106 (*)    Calcium, Ion 0.99 (*)    Hemoglobin 12.2 (*)    HCT 36.0 (*)    All other components within normal limits  TROPONIN I  PROTIME-INR  TROPONIN I  I-STAT TROPOININ, ED  I-STAT TROPOININ, ED    Imaging Review Dg Chest 2 View  12/14/2014   CLINICAL DATA:  Chest pain, cough  EXAM: CHEST  2 VIEW  COMPARISON:  07/23/2012  FINDINGS: Mild patchy right lower lobe opacity, likely atelectasis. No pleural effusion or pneumothorax.  Cardiomegaly.  Right shoulder arthroplasty.  IMPRESSION: Mild patchy right lower lobe opacity, likely atelectasis.   Electronically Signed   By: Julian Hy M.D.   On: 12/14/2014 21:16     EKG Interpretation   Date/Time:  Wednesday December 14 2014 18:57:43 EST Ventricular Rate:  61 PR Interval:  182 QRS Duration: 95 QT Interval:  446 QTC Calculation: 449 R Axis:   -15 Text Interpretation:  Sinus rhythm Borderline left axis deviation Low  voltage, precordial leads Consider anterior infarct Confirmed by ZAMMIT   MD, Broadus John [phone]) on 12/14/2014 9:07:35 PM       9:41 PM Patient seen and assessed by attending physician, Dr. Sharmon Leyden. As per physician, recommended patient to be discharged home. Recommended Levaquin to be administered to the patient and for patient to be followed up within a 48 hours.  MDM   Final diagnoses:  HCAP (healthcare-associated pneumonia)  Chest wall pain    Medications  levofloxacin  (LEVAQUIN) tablet 500 mg (500 mg Oral Given 12/14/14 2239)    Filed Vitals:   12/14/14 2130 12/14/14 2215 12/14/14 2233 12/14/14 2245  BP: 114/63 106/58 115/55 100/59  Pulse: 62 61 58 59  Temp:      TempSrc:      Resp: 23 16 16 16   Height:      Weight:      SpO2: 98% 98% 97% 98%   EKG sinus rhythm with left axis deviation with a heart rate of 61 bpm. Troponin negative elevation. delta troponin negative elevation. BNP 106.4. PT/INR negative elevation. CBC negative elevated leukocytosis. Hemoglobin 11.6, hematocrit 35.8 - similar when compared to previous labs. CMP mildly elevated creatinine of 1.47 - when compared to 2 years ago patient had creatinine of 1.14-in the past he has had creatinine of 1.40. Chest x-ray noted mild patchy right lower lobe opacity, likely atelectasis. Patient presenting to the ED with increased cough, nasal congestion, and bilateral chest tightness. Appears to be chest wall pain - tenderness upon palpation to the chest wall, reproducible upon palpation. Patient from [place] facility with residents with similar symptoms. Suspicion high for HCAP. Patient seen and assessed by attending physician, Dr. Sharmon Leyden. As per physician, reported that patient can be discharged home - recommended patient to be discharged home with Levaquin. Reported patient to be seen within 48 hours. Patient stable, afebrile. Patient not septic appearing. Negative signs of respiratory distress. Patient has history of COPD and is on oxygen prn and night time. Discharged patient. Discharged patient with antibiotics. Discussed with patient that he needs to be seen and re-assessed by his physician within 48 hours. Discussed with patient to rest and stay hydrated. Discussed with patient to closely monitor symptoms and if symptoms are to worsen or change to report back to the ED - strict return instructions given.  Patient agreed to plan of care, understood, all questions answered.   [employer],  PA-C 12/15/14 [id number]  Maudry Diego, MD 12/16/14 [phone]

## 2014-12-15 DIAGNOSIS — J159 Unspecified bacterial pneumonia: Secondary | ICD-10-CM | POA: Diagnosis not present

## 2014-12-15 LAB — TROPONIN I

## 2014-12-15 LAB — I-STAT TROPONIN, ED: Troponin i, poc: 0 ng/mL (ref 0.00–0.08)

## 2015-02-23 ENCOUNTER — Emergency Department (HOSPITAL_COMMUNITY)
Admission: EM | Admit: 2015-02-23 | Discharge: 2015-02-23 | Disposition: A | Discharge status: Home / Self Care | Payer: Medicare Other | Attending: Emergency Medicine | Admitting: Emergency Medicine

## 2015-02-23 ENCOUNTER — Encounter (HOSPITAL_COMMUNITY): Payer: Self-pay | Admitting: Emergency Medicine

## 2015-02-23 ENCOUNTER — Emergency Department (HOSPITAL_COMMUNITY): Payer: Medicare Other

## 2015-02-23 DIAGNOSIS — Z8669 Personal history of other diseases of the nervous system and sense organs: Secondary | ICD-10-CM | POA: Insufficient documentation

## 2015-02-23 DIAGNOSIS — R079 Chest pain, unspecified: Secondary | ICD-10-CM | POA: Insufficient documentation

## 2015-02-23 DIAGNOSIS — Q859 Phakomatosis, unspecified: Secondary | ICD-10-CM | POA: Insufficient documentation

## 2015-02-23 DIAGNOSIS — Z7982 Long term (current) use of aspirin: Secondary | ICD-10-CM | POA: Diagnosis not present

## 2015-02-23 DIAGNOSIS — Z79899 Other long term (current) drug therapy: Secondary | ICD-10-CM | POA: Diagnosis not present

## 2015-02-23 DIAGNOSIS — E785 Hyperlipidemia, unspecified: Secondary | ICD-10-CM | POA: Insufficient documentation

## 2015-02-23 DIAGNOSIS — Z7902 Long term (current) use of antithrombotics/antiplatelets: Secondary | ICD-10-CM | POA: Diagnosis not present

## 2015-02-23 DIAGNOSIS — J441 Chronic obstructive pulmonary disease with (acute) exacerbation: Secondary | ICD-10-CM | POA: Diagnosis not present

## 2015-02-23 DIAGNOSIS — R0600 Dyspnea, unspecified: Secondary | ICD-10-CM

## 2015-02-23 DIAGNOSIS — M1990 Unspecified osteoarthritis, unspecified site: Secondary | ICD-10-CM | POA: Insufficient documentation

## 2015-02-23 DIAGNOSIS — E039 Hypothyroidism, unspecified: Secondary | ICD-10-CM | POA: Insufficient documentation

## 2015-02-23 DIAGNOSIS — Z7952 Long term (current) use of systemic steroids: Secondary | ICD-10-CM | POA: Diagnosis not present

## 2015-02-23 DIAGNOSIS — I10 Essential (primary) hypertension: Secondary | ICD-10-CM | POA: Diagnosis not present

## 2015-02-23 DIAGNOSIS — E669 Obesity, unspecified: Secondary | ICD-10-CM | POA: Diagnosis not present

## 2015-02-23 DIAGNOSIS — I2510 Atherosclerotic heart disease of native coronary artery without angina pectoris: Secondary | ICD-10-CM | POA: Insufficient documentation

## 2015-02-23 DIAGNOSIS — Z85528 Personal history of other malignant neoplasm of kidney: Secondary | ICD-10-CM | POA: Insufficient documentation

## 2015-02-23 LAB — CBC WITH DIFFERENTIAL/PLATELET
BASOS ABS: 0 10*3/uL (ref 0.0–0.1)
BASOS PCT: 0 % (ref 0–1)
EOS ABS: 0.2 10*3/uL (ref 0.0–0.7)
EOS PCT: 3 % (ref 0–5)
HCT: 36.4 % — ABNORMAL LOW (ref 39.0–52.0)
Hemoglobin: 11.8 g/dL — ABNORMAL LOW (ref 13.0–17.0)
Lymphocytes Relative: 21 % (ref 12–46)
Lymphs Abs: 1.4 10*3/uL (ref 0.7–4.0)
MCH: 32.5 pg (ref 26.0–34.0)
MCHC: 32.4 g/dL (ref 30.0–36.0)
MCV: 100.3 fL — ABNORMAL HIGH (ref 78.0–100.0)
MONO ABS: 0.5 10*3/uL (ref 0.1–1.0)
MONOS PCT: 8 % (ref 3–12)
NEUTROS ABS: 4.7 10*3/uL (ref 1.7–7.7)
Neutrophils Relative %: 68 % (ref 43–77)
Platelets: 164 10*3/uL (ref 150–400)
RBC: 3.63 MIL/uL — AB (ref 4.22–5.81)
RDW: 14 % (ref 11.5–15.5)
WBC: 6.8 10*3/uL (ref 4.0–10.5)

## 2015-02-23 LAB — BASIC METABOLIC PANEL
ANION GAP: 6 (ref 5–15)
BUN: 21 mg/dL — ABNORMAL HIGH (ref 6–20)
CO2: 31 mmol/L (ref 22–32)
CREATININE: 1.4 mg/dL — AB (ref 0.61–1.24)
Calcium: 8.1 mg/dL — ABNORMAL LOW (ref 8.9–10.3)
Chloride: 101 mmol/L (ref 101–111)
GFR, EST AFRICAN AMERICAN: 55 mL/min — AB (ref >60)
GFR, EST NON AFRICAN AMERICAN: 48 mL/min — AB (ref >60)
Glucose, Bld: 104 mg/dL — ABNORMAL HIGH (ref 70–99)
Potassium: 4.4 mmol/L (ref 3.5–5.1)
SODIUM: 138 mmol/L (ref 135–145)

## 2015-02-23 LAB — I-STAT TROPONIN, ED
TROPONIN I, POC: 0 ng/mL (ref 0.00–0.08)
Troponin i, poc: 0.01 ng/mL (ref 0.00–0.08)

## 2015-02-23 LAB — BRAIN NATRIURETIC PEPTIDE: B Natriuretic Peptide: 27.7 pg/mL (ref 0.0–100.0)

## 2015-02-23 NOTE — ED Notes (Signed)
Bed: RA07 Expected date:  Expected time:  Means of arrival:  Comments: EMS- chest discomfort, cough, Hx of PNA

## 2015-02-23 NOTE — ED Provider Notes (Signed)
CSN: [id number]     Arrival date & time 02/23/15  1527 History   First MD Initiated Contact with Patient 02/23/15 1538     Chief Complaint  Patient presents with  . Chest Pain  . Abdominal Pain      HPI  History presents evaluation of upper abdominal or lower chest pain over several months duration. Complains of shortness of breath.  One previous ED evaluation for similar complaints 2/24. Discharge with treatment for COPD exacerbation with antibiotics.  He states that for the last several months when he walks from his room to the dining all that he will feel short of breath. He states he has to sit down" take some slow easy breath". This has not changed or worsened.  He states that occasionally he will feel tightness in his ribs both on the left and right side. But cannot describe the last time that this happened. Does not have particularly exertional angina. Does not exert much. Soap and water from his room at his assisted living to the dining all and one from his room for activities as well.  History of COPD but is a lifetime nonsmoker. He states he does not know when he was given this diagnosis. Per his chart his symptoms sound pickwickian. Obstructive sleep apnea, and dyspnea on exertion.  Family medicine cardiac evaluation prior to renal procedure 2012 showed no ischemic heart disease.  No history of hypertension, or diabetes his report. However he does take metoprolol and at one point in his chart, it does state hypertension..  Past Medical History  Diagnosis Date  . Other congenital hamartoses, not elsewhere classified   . Family history of ischemic heart disease   . Family history of malignant neoplasm of gastrointestinal tract   . Allergic rhinitis, cause unspecified   . Other and unspecified hyperlipidemia   . Unspecified essential hypertension   . Obesity hypoventilation syndrome     failed cpap/bipap trial  . Hypothyroidism   . GERD (gastroesophageal reflux disease)   .  Arthritis     osteoarthritis  . DEMENTIA     Alzheimer's, senile dementia  . Shortness of breath   . Cancer     renal carcinoma  . Sleep apnea     OXYGEN 24 HR/DAY-NO CPAP  . COPD (chronic obstructive pulmonary disease)     [name] FOLLOWS PT--PT USING OXYGEN 24 HRS A DAY  . COPD (chronic obstructive pulmonary disease)     PULMONARY CLEARANCE FOR LEFT RENAL CRYOABLATION ON CHART FROM DR. CLANCE -07/26/11  . Coronary artery disease     CARDIAC CLEARANCE  OFFICE NOTE FOR LEFT CRYO ABLATION ON CHART FROM [name] -"RECENT STRESS TEST DID NOT SHOW ISCHEMIA" -"PRESUMED CAD DUE TO CORONARY CALCIFICATIONS"    Past Surgical History  Procedure Laterality Date  . Eye surgery      bilateral cataracts  . Joint replacement      bilateral knee replacements  . Carpal tunnel release      right  . Thyroidectomy    . Cholecystectomy  2009  . Knee arthroplasty      2005 LEFT   AND 2007 RIGHT--[name]  . Tumor removal  09/14/2011   Family History  Problem Relation Age of Onset  . Cancer Other     colon  . Heart disease Other     CAD, MI and Heart Attack   History  Substance Use Topics  . Smoking status: Never Smoker   . Smokeless tobacco: Never  Used  . Alcohol Use: No    Review of Systems  Constitutional: Negative for fever, chills, diaphoresis, appetite change and fatigue.  HENT: Negative for mouth sores, sore throat and trouble swallowing.   Eyes: Negative for visual disturbance.  Respiratory: Positive for chest tightness and shortness of breath. Negative for cough and wheezing.   Cardiovascular: Negative for chest pain.  Gastrointestinal: Negative for nausea, vomiting, abdominal pain, diarrhea and abdominal distention.  Endocrine: Negative for polydipsia, polyphagia and polyuria.  Genitourinary: Negative for dysuria, frequency and hematuria.  Musculoskeletal: Negative for gait problem.  Skin: Negative for color change, pallor and rash.  Neurological: Negative for  dizziness, syncope, light-headedness and headaches.  Hematological: Does not bruise/bleed easily.  Psychiatric/Behavioral: Negative for behavioral problems and confusion.      Allergies  Review of patient's allergies indicates no known allergies.  Home Medications   Prior to Admission medications   Medication Sig Start Date End Date Taking? Authorizing Provider  acetaminophen (TYLENOL) 325 MG tablet Take 325 mg by mouth every 6 (six) hours as needed for mild pain. For pain/stiffiness.   Yes Historical Provider, MD  aspirin 81 MG chewable tablet Chew 81 mg by mouth daily.   Yes Historical Provider, MD  atorvastatin (LIPITOR) 20 MG tablet Take 20 mg by mouth daily.   Yes Historical Provider, MD  bisacodyl (DULCOLAX) 10 MG suppository Place 10 mg rectally daily as needed for moderate constipation.   Yes Historical Provider, MD  budesonide-formoterol (SYMBICORT) 160-4.5 MCG/ACT inhaler Inhale 2 puffs into the lungs 2 (two) times daily.   Yes Historical Provider, MD  calcitRIOL (ROCALTROL) 0.5 MCG capsule Take 0.5 mcg by mouth daily.   Yes Historical Provider, MD  clopidogrel (PLAVIX) 75 MG tablet Take 75 mg by mouth every morning.    Yes Historical Provider, MD  cyanocobalamin 500 MCG tablet Take 500 mcg by mouth daily.   Yes Historical Provider, MD  cycloSPORINE (RESTASIS) 0.05 % ophthalmic emulsion Place 1 drop into both eyes 2 (two) times daily.    Yes Historical Provider, MD  diclofenac sodium (VOLTAREN) 1 % GEL Apply 2 g topically daily.   Yes Historical Provider, MD  docusate sodium (COLACE) 100 MG capsule Take 100 mg by mouth 2 (two) times daily as needed for mild constipation or moderate constipation.   Yes Historical Provider, MD  ferrous sulfate 325 (65 FE) MG tablet Take 325 mg by mouth daily with breakfast.   Yes Historical Provider, MD  fexofenadine (ALLEGRA) 180 MG tablet Take 180 mg by mouth daily.   Yes Historical Provider, MD  finasteride (PROSCAR) 5 MG tablet Take 5 mg by  mouth daily.   Yes Historical Provider, MD  fluticasone (FLONASE) 50 MCG/ACT nasal spray Place 1 spray into the nose 2 (two) times daily.    Yes Historical Provider, MD  ipratropium (ATROVENT) 0.02 % nebulizer solution Take 500 mcg by nebulization every 4 (four) hours as needed for wheezing or shortness of breath.    Yes Historical Provider, MD  levothyroxine (SYNTHROID, LEVOTHROID) 200 MCG tablet Take 200 mcg by mouth daily.    Yes Historical Provider, MD  methylcellulose (ARTIFICIAL TEARS) 1 % ophthalmic solution Place 1 drop into both eyes 2 (two) times daily.   Yes Historical Provider, MD  metoprolol tartrate (LOPRESSOR) 25 MG tablet Take 12.5 mg by mouth 2 (two) times daily. Hold for systolic blood pressure <539   Yes Historical Provider, MD  nystatin (MYCOSTATIN/NYSTOP) 100000 UNIT/GM POWD Apply 1 g topically 2 (two) times daily  as needed (groin irrtation).    Yes Historical Provider, MD  oxyCODONE (OXY IR/ROXICODONE) 5 MG immediate release tablet Take 2.5 mg by mouth 2 (two) times daily as needed for severe pain.   Yes Historical Provider, MD  pantoprazole (PROTONIX) 40 MG tablet Take 40 mg by mouth daily.    Yes Historical Provider, MD  polyethylene glycol powder (GLYCOLAX/MIRALAX) powder Take 1 Container by mouth once. Mix 1 capful (17g) in 8 ounces of fluid and drink   Yes Historical Provider, MD  pregabalin (LYRICA) 75 MG capsule Take 75 mg by mouth 2 (two) times daily.   Yes Historical Provider, MD  senna-docusate (SENOKOT-S) 8.6-50 MG per tablet Take 3 tablets by mouth 2 (two) times daily.    Yes Historical Provider, MD  sodium chloride (OCEAN) 0.65 % SOLN nasal spray Place 1 spray into both nostrils every 2 (two) hours as needed for congestion.   Yes Historical Provider, MD  Tamsulosin HCl (FLOMAX) 0.4 MG CAPS Take 0.4 mg by mouth daily. Hold if SBP < 100   Yes Historical Provider, MD  traZODone (DESYREL) 150 MG tablet Take 300 mg by mouth at bedtime.   Yes Historical Provider, MD   venlafaxine (EFFEXOR) 75 MG tablet Take 75 mg by mouth daily.   Yes Historical Provider, MD  white petrolatum (VASELINE) GEL Apply 1 application topically as needed for dry skin. Applied to right inner buttock at bedtime after washing.   Yes Historical Provider, MD  levofloxacin (LEVAQUIN) 500 MG tablet Take 1 tablet (500 mg total) by mouth daily. Patient not taking: Reported on 02/23/2015 12/14/14   Marissa Sciacca, PA-C   BP 131/67 mmHg  Pulse 71  Temp(Src) 98.2 F (36.8 C) (Oral)  Resp 16  Ht 6\' 4"  (1.93 m)  Wt 300 lb (136.079 kg)  BMI 36.53 kg/m2  SpO2 96% Physical Exam  Constitutional: He is oriented to person, place, and time. He appears well-developed and well-nourished. No distress.  Obese male in no acute distress.  HENT:  Head: Normocephalic.  Eyes: Conjunctivae are normal. Pupils are equal, round, and reactive to light. No scleral icterus.  Neck: Normal range of motion. Neck supple. No thyromegaly present.  Cardiovascular: Normal rate and regular rhythm.  Exam reveals no gallop and no friction rub.   No murmur heard. Pulmonary/Chest: Effort normal and breath sounds normal. No respiratory distress. He has no wheezes. He has no rales.  Clear bilateral breath sounds. No wheezing rales rhonchi or prolongation. No increased work of breathing.  Abdominal: Soft. Bowel sounds are normal. He exhibits no distension. There is no tenderness. There is no rebound.  Musculoskeletal: Normal range of motion.  Neurological: He is alert and oriented to person, place, and time.  Skin: Skin is warm and dry. No rash noted.  Psychiatric: He has a normal mood and affect. His behavior is normal.    ED Course  Procedures (including critical care time) Labs Review Labs Reviewed  CBC WITH DIFFERENTIAL/PLATELET - Abnormal; Notable for the following:    RBC 3.63 (*)    Hemoglobin 11.8 (*)    HCT 36.4 (*)    MCV 100.3 (*)    All other components within normal limits  BASIC METABOLIC PANEL -  Abnormal; Notable for the following:    Glucose, Bld 104 (*)    BUN 21 (*)    Creatinine, Ser 1.40 (*)    Calcium 8.1 (*)    GFR calc non Af Amer 48 (*)    GFR calc Af  Amer 55 (*)    All other components within normal limits  BRAIN NATRIURETIC PEPTIDE  I-STAT TROPOININ, ED  Randolm Idol, ED    Imaging Review Dg Chest 2 View  02/23/2015   CLINICAL DATA:  Bilateral lower chest pain.  EXAM: CHEST  2 VIEW  COMPARISON:  12/14/2014  FINDINGS: Mild cardiomegaly in stable; negative aortic and hilar contours. Chronic elevation of the left diaphragm with overlying streaky opacity, likely atelectasis or scarring. These opacities were present dating back to 01/16/2012 chest CT. Reverse right glenohumeral arthroplasty with alignment similar to previous.  IMPRESSION: Chronic lower lobe opacities, likely scarring. No change from February 2016 to suggest acute disease.   Electronically Signed   By: Monte Fantasia M.D.   On: 02/23/2015 18:31     EKG Interpretation None      MDM   Final diagnoses:  Chest pain  Dyspnea    Studies here are reassuring. He is due to around the department and does not desaturate. He does inquire about wearing oxygen. Deferred this to his primary care physician is I do not have indication for him to require 24-hour oxygen.    Tanna Furry, MD 02/23/15 [phone]

## 2015-02-23 NOTE — ED Notes (Signed)
[facility] RN notified of patient's labs and EKG results. Knows patient is to follow up with cardiology. Patient requests his sleep medicine be given to him when he returns to [facility]. RN aware and will give medication when he returns. Patient has all belongings with him at bedside.

## 2015-02-23 NOTE — ED Notes (Signed)
MD at bedside. 

## 2015-02-23 NOTE — ED Notes (Signed)
Per EMS-pt reports chest pain but points to bilateral upper abdomen. Pain has been going on for 3 days, worse on exertion and when he coughs. Hx PNA. Says he has had some fever and chills. Becomes SOB on exertion. A-fib on monitor. Swelling noted to bilateral legs-pt reports this is baseline. Also reports his abdomen is at normal size-appears distended. Having tenderness to abdomen. A&Ox4. Lung sounds clear upper and right lower but some diminished on left lower. VS: BP 130/76 HR 88 irregular RR 16 SpO2 96% CBG 133 mg/dl.

## 2015-02-23 NOTE — Discharge Instructions (Signed)
Call Cardiologist office above for outpatient appointment. With her primary care physician about the possibility of oxygen for use during the day.   Chest Pain (Nonspecific) It is often hard to give a specific diagnosis for the cause of chest pain. There is always a chance that your pain could be related to something serious, such as a heart attack or a blood clot in the lungs. You need to follow up with your health care provider for further evaluation. CAUSES   Heartburn.  Pneumonia or bronchitis.  Anxiety or stress.  Inflammation around your heart (pericarditis) or lung (pleuritis or pleurisy).  A blood clot in the lung.  A collapsed lung (pneumothorax). It can develop suddenly on its own (spontaneous pneumothorax) or from trauma to the chest.  Shingles infection (herpes zoster virus). The chest wall is composed of bones, muscles, and cartilage. Any of these can be the source of the pain.  The bones can be bruised by injury.  The muscles or cartilage can be strained by coughing or overwork.  The cartilage can be affected by inflammation and become sore (costochondritis). DIAGNOSIS  Lab tests or other studies may be needed to find the cause of your pain. Your health care provider may have you take a test called an ambulatory electrocardiogram (ECG). An ECG records your heartbeat patterns over a 24-hour period. You may also have other tests, such as:  Transthoracic echocardiogram (TTE). During echocardiography, sound waves are used to evaluate how blood flows through your heart.  Transesophageal echocardiogram (TEE).  Cardiac monitoring. This allows your health care provider to monitor your heart rate and rhythm in real time.  Holter monitor. This is a portable device that records your heartbeat and can help diagnose heart arrhythmias. It allows your health care provider to track your heart activity for several days, if needed.  Stress tests by exercise or by giving medicine  that makes the heart beat faster. TREATMENT   Treatment depends on what may be causing your chest pain. Treatment may include:  Acid blockers for heartburn.  Anti-inflammatory medicine.  Pain medicine for inflammatory conditions.  Antibiotics if an infection is present.  You may be advised to change lifestyle habits. This includes stopping smoking and avoiding alcohol, caffeine, and chocolate.  You may be advised to keep your head raised (elevated) when sleeping. This reduces the chance of acid going backward from your stomach into your esophagus. Most of the time, nonspecific chest pain will improve within 2-3 days with rest and mild pain medicine.  HOME CARE INSTRUCTIONS   If antibiotics were prescribed, take them as directed. Finish them even if you start to feel better.  For the next few days, avoid physical activities that bring on chest pain. Continue physical activities as directed.  Do not use any tobacco products, including cigarettes, chewing tobacco, or electronic cigarettes.  Avoid drinking alcohol.  Only take medicine as directed by your health care provider.  Follow your health care provider's suggestions for further testing if your chest pain does not go away.  Keep any follow-up appointments you made. If you do not go to an appointment, you could develop lasting (chronic) problems with pain. If there is any problem keeping an appointment, call to reschedule. SEEK MEDICAL CARE IF:   Your chest pain does not go away, even after treatment.  You have a rash with blisters on your chest.  You have a fever. SEEK IMMEDIATE MEDICAL CARE IF:   You have increased chest pain or pain that  spreads to your arm, neck, jaw, back, or abdomen.  You have shortness of breath.  You have an increasing cough, or you cough up blood.  You have severe back or abdominal pain.  You feel nauseous or vomit.  You have severe weakness.  You faint.  You have chills. This is an  emergency. Do not wait to see if the pain will go away. Get medical help at once. Call your local emergency services (911 in [place] Do not drive yourself to the hospital. MAKE SURE YOU:   Understand these instructions.  Will watch your condition.  Will get help right away if you are not doing well or get worse. Document Released: 07/17/2005 Document Revised: 10/12/2013 Document Reviewed: 05/12/2008 [facility] Patient Information 2015 [place], [place]. This information is not intended to replace advice given to you by your health care provider. Make sure you discuss any questions you have with your health care provider.

## 2015-05-05 ENCOUNTER — Encounter (HOSPITAL_COMMUNITY): Payer: Self-pay | Admitting: Emergency Medicine

## 2015-05-05 ENCOUNTER — Emergency Department (HOSPITAL_COMMUNITY)
Admission: EM | Admit: 2015-05-05 | Discharge: 2015-05-05 | Disposition: A | Discharge status: Home / Self Care | Payer: Medicare Other | Attending: Emergency Medicine | Admitting: Emergency Medicine

## 2015-05-05 DIAGNOSIS — F0280 Dementia in other diseases classified elsewhere without behavioral disturbance: Secondary | ICD-10-CM | POA: Diagnosis not present

## 2015-05-05 DIAGNOSIS — E785 Hyperlipidemia, unspecified: Secondary | ICD-10-CM | POA: Diagnosis not present

## 2015-05-05 DIAGNOSIS — R21 Rash and other nonspecific skin eruption: Secondary | ICD-10-CM | POA: Insufficient documentation

## 2015-05-05 DIAGNOSIS — H5789 Other specified disorders of eye and adnexa: Secondary | ICD-10-CM

## 2015-05-05 DIAGNOSIS — H578 Other specified disorders of eye and adnexa: Secondary | ICD-10-CM | POA: Insufficient documentation

## 2015-05-05 DIAGNOSIS — Z7982 Long term (current) use of aspirin: Secondary | ICD-10-CM | POA: Diagnosis not present

## 2015-05-05 DIAGNOSIS — E669 Obesity, unspecified: Secondary | ICD-10-CM | POA: Insufficient documentation

## 2015-05-05 DIAGNOSIS — G309 Alzheimer's disease, unspecified: Secondary | ICD-10-CM | POA: Diagnosis not present

## 2015-05-05 DIAGNOSIS — Z85528 Personal history of other malignant neoplasm of kidney: Secondary | ICD-10-CM | POA: Insufficient documentation

## 2015-05-05 DIAGNOSIS — I2510 Atherosclerotic heart disease of native coronary artery without angina pectoris: Secondary | ICD-10-CM | POA: Diagnosis not present

## 2015-05-05 DIAGNOSIS — H5711 Ocular pain, right eye: Secondary | ICD-10-CM | POA: Diagnosis present

## 2015-05-05 DIAGNOSIS — M1990 Unspecified osteoarthritis, unspecified site: Secondary | ICD-10-CM | POA: Insufficient documentation

## 2015-05-05 DIAGNOSIS — K219 Gastro-esophageal reflux disease without esophagitis: Secondary | ICD-10-CM | POA: Insufficient documentation

## 2015-05-05 DIAGNOSIS — Z7902 Long term (current) use of antithrombotics/antiplatelets: Secondary | ICD-10-CM | POA: Diagnosis not present

## 2015-05-05 DIAGNOSIS — I10 Essential (primary) hypertension: Secondary | ICD-10-CM | POA: Diagnosis not present

## 2015-05-05 DIAGNOSIS — J449 Chronic obstructive pulmonary disease, unspecified: Secondary | ICD-10-CM | POA: Insufficient documentation

## 2015-05-05 DIAGNOSIS — E039 Hypothyroidism, unspecified: Secondary | ICD-10-CM | POA: Diagnosis not present

## 2015-05-05 DIAGNOSIS — Z79899 Other long term (current) drug therapy: Secondary | ICD-10-CM | POA: Diagnosis not present

## 2015-05-05 MED ORDER — HYDROCORTISONE 1 % EX CREA: TOPICAL_CREAM | CUTANEOUS | Status: DC

## 2015-05-05 NOTE — ED Notes (Signed)
Per EMS-right eye pain for three days-has been having drainage in that say eye longer

## 2015-05-05 NOTE — ED Notes (Signed)
[facility] came and picked up patient. Left his billfold here, alerted [facility] to missing wallet

## 2015-05-05 NOTE — ED Notes (Signed)
[facility] contacted to arrange for transportation back to facility.

## 2015-05-05 NOTE — ED Provider Notes (Signed)
History  This chart was scribed for non-physician practitioner, Hyman Bible, PA-C,working with Virgel Manifold, MD, by Marlowe Kays, ED Scribe. This patient was seen in room [place] and the patient's care was started at 4:10 PM.  Chief Complaint  Patient presents with  . Eye Pain   The history is provided by the patient and medical records. No language interpreter was used.    HPI Comments:  [name] [name] is a 74 y.o. male brought in by EMS from [facility], who presents to the Emergency Department complaining of clear right eye drainage that began approximately two weeks ago. He reports associated soreness of the bilateral eyes that began about 3 days ago. He also reports associated rhinorrhea and mild bilateral eye itching. He states he uses artificial tears and Restasis opthalmic drops daily. He has not taken anything to treat his symptoms. He denies modifying factors. He denies any trauma or injury to either eye. He denies visual changes, fever or chills.  Pt also reports an itching rash to the posterior neck that appeared about 3 weeks ago. He has not done anything to treat the rash. He denies modifying factors of the symptoms.   Past Medical History  Diagnosis Date  . Other congenital hamartoses, not elsewhere classified   . Family history of ischemic heart disease   . Family history of malignant neoplasm of gastrointestinal tract   . Allergic rhinitis, cause unspecified   . Other and unspecified hyperlipidemia   . Unspecified essential hypertension   . Obesity hypoventilation syndrome     failed cpap/bipap trial  . Hypothyroidism   . GERD (gastroesophageal reflux disease)   . Arthritis     osteoarthritis  . DEMENTIA     Alzheimer's, senile dementia  . Shortness of breath   . Cancer     renal carcinoma  . Sleep apnea     OXYGEN 24 HR/DAY-NO CPAP  . COPD (chronic obstructive pulmonary disease)     [name] FOLLOWS PT--PT USING OXYGEN 24  HRS A DAY  . COPD (chronic obstructive pulmonary disease)     PULMONARY CLEARANCE FOR LEFT RENAL CRYOABLATION ON CHART FROM DR. CLANCE -07/26/11  . Coronary artery disease     CARDIAC CLEARANCE  OFFICE NOTE FOR LEFT CRYO ABLATION ON CHART FROM [name] -"RECENT STRESS TEST DID NOT SHOW ISCHEMIA" -"PRESUMED CAD DUE TO CORONARY CALCIFICATIONS"    Past Surgical History  Procedure Laterality Date  . Eye surgery      bilateral cataracts  . Joint replacement      bilateral knee replacements  . Carpal tunnel release      right  . Thyroidectomy    . Cholecystectomy  2009  . Knee arthroplasty      2005 LEFT   AND 2007 RIGHT--[name]  . Tumor removal  09/14/2011   Family History  Problem Relation Age of Onset  . Cancer Other     colon  . Heart disease Other     CAD, MI and Heart Attack   History  Substance Use Topics  . Smoking status: Never Smoker   . Smokeless tobacco: Never Used  . Alcohol Use: No    Review of Systems  Constitutional: Negative for fever and chills.  HENT: Positive for rhinorrhea.   Eyes: Positive for pain, discharge and itching. Negative for visual disturbance.    Allergies  Review of patient's allergies indicates no known allergies.  Home Medications   Prior to Admission medications  Medication Sig Start Date End Date Taking? Authorizing Provider  acetaminophen (TYLENOL) 325 MG tablet Take 325 mg by mouth every 6 (six) hours as needed for mild pain. For pain/stiffiness.    Historical Provider, MD  aspirin 81 MG chewable tablet Chew 81 mg by mouth daily.    Historical Provider, MD  atorvastatin (LIPITOR) 20 MG tablet Take 20 mg by mouth daily.    Historical Provider, MD  bisacodyl (DULCOLAX) 10 MG suppository Place 10 mg rectally daily as needed for moderate constipation.    Historical Provider, MD  budesonide-formoterol (SYMBICORT) 160-4.5 MCG/ACT inhaler Inhale 2 puffs into the lungs 2 (two) times daily.    Historical Provider, MD  calcitRIOL  (ROCALTROL) 0.5 MCG capsule Take 0.5 mcg by mouth daily.    Historical Provider, MD  clopidogrel (PLAVIX) 75 MG tablet Take 75 mg by mouth every morning.     Historical Provider, MD  cyanocobalamin 500 MCG tablet Take 500 mcg by mouth daily.    Historical Provider, MD  cycloSPORINE (RESTASIS) 0.05 % ophthalmic emulsion Place 1 drop into both eyes 2 (two) times daily.     Historical Provider, MD  diclofenac sodium (VOLTAREN) 1 % GEL Apply 2 g topically daily.    Historical Provider, MD  docusate sodium (COLACE) 100 MG capsule Take 100 mg by mouth 2 (two) times daily as needed for mild constipation or moderate constipation.    Historical Provider, MD  ferrous sulfate 325 (65 FE) MG tablet Take 325 mg by mouth daily with breakfast.    Historical Provider, MD  fexofenadine (ALLEGRA) 180 MG tablet Take 180 mg by mouth daily.    Historical Provider, MD  finasteride (PROSCAR) 5 MG tablet Take 5 mg by mouth daily.    Historical Provider, MD  fluticasone (FLONASE) 50 MCG/ACT nasal spray Place 1 spray into the nose 2 (two) times daily.     Historical Provider, MD  hydrocortisone cream 1 % Apply to affected area 2 times daily 05/05/15   Hyman Bible, PA-C  ipratropium (ATROVENT) 0.02 % nebulizer solution Take 500 mcg by nebulization every 4 (four) hours as needed for wheezing or shortness of breath.     Historical Provider, MD  levofloxacin (LEVAQUIN) 500 MG tablet Take 1 tablet (500 mg total) by mouth daily. Patient not taking: Reported on 02/23/2015 12/14/14   Marissa Sciacca, PA-C  levothyroxine (SYNTHROID, LEVOTHROID) 200 MCG tablet Take 200 mcg by mouth daily.     Historical Provider, MD  methylcellulose (ARTIFICIAL TEARS) 1 % ophthalmic solution Place 1 drop into both eyes 2 (two) times daily.    Historical Provider, MD  metoprolol tartrate (LOPRESSOR) 25 MG tablet Take 12.5 mg by mouth 2 (two) times daily. Hold for systolic blood pressure <034    Historical Provider, MD  nystatin (MYCOSTATIN/NYSTOP)  100000 UNIT/GM POWD Apply 1 g topically 2 (two) times daily as needed (groin irrtation).     Historical Provider, MD  oxyCODONE (OXY IR/ROXICODONE) 5 MG immediate release tablet Take 2.5 mg by mouth 2 (two) times daily as needed for severe pain.    Historical Provider, MD  pantoprazole (PROTONIX) 40 MG tablet Take 40 mg by mouth daily.     Historical Provider, MD  polyethylene glycol powder (GLYCOLAX/MIRALAX) powder Take 1 Container by mouth once. Mix 1 capful (17g) in 8 ounces of fluid and drink    Historical Provider, MD  pregabalin (LYRICA) 75 MG capsule Take 75 mg by mouth 2 (two) times daily.    Historical Provider, MD  senna-docusate (SENOKOT-S) 8.6-50 MG per tablet Take 3 tablets by mouth 2 (two) times daily.     Historical Provider, MD  sodium chloride (OCEAN) 0.65 % SOLN nasal spray Place 1 spray into both nostrils every 2 (two) hours as needed for congestion.    Historical Provider, MD  Tamsulosin HCl (FLOMAX) 0.4 MG CAPS Take 0.4 mg by mouth daily. Hold if SBP < 100    Historical Provider, MD  traZODone (DESYREL) 150 MG tablet Take 300 mg by mouth at bedtime.    Historical Provider, MD  venlafaxine (EFFEXOR) 75 MG tablet Take 75 mg by mouth daily.    Historical Provider, MD  white petrolatum (VASELINE) GEL Apply 1 application topically as needed for dry skin. Applied to right inner buttock at bedtime after washing.    Historical Provider, MD   Triage Vitals: BP 111/61 mmHg  Pulse 63  Temp(Src) 97.8 F (36.6 C) (Oral)  Resp 15  SpO2 96% Physical Exam  Constitutional: He is oriented to person, place, and time. He appears well-developed and well-nourished.  HENT:  Head: Normocephalic and atraumatic.  Eyes: EOM and lids are normal. Pupils are equal, round, and reactive to light. Lids are everted and swept, no foreign bodies found. Right eye exhibits no discharge and no exudate. No foreign body present in the right eye. Left eye exhibits no discharge and no exudate. No foreign body  present in the left eye. Right conjunctiva is not injected. Left conjunctiva is not injected.    Visual Acuity Right Eye Distance: 20/40 Left Eye Distance: 20/50 Bilateral Distance: 20/40  Neck: Normal range of motion.  Cardiovascular: Normal rate, regular rhythm and normal heart sounds.   Pulmonary/Chest: Effort normal and breath sounds normal.  Musculoskeletal: Normal range of motion.  Neurological: He is alert and oriented to person, place, and time.  Skin: Skin is warm and dry.  Erythematous, blanchable papules noted to posterior neck. No drainage.  Psychiatric: He has a normal mood and affect. His behavior is normal.  Nursing note and vitals reviewed.   ED Course  Procedures (including critical care time) DIAGNOSTIC STUDIES: Oxygen Saturation is 96% on RA, adequate by my interpretation.   COORDINATION OF CARE: 4:19 PM- Will have Dr. Wilson Singer examine patient. Pt verbalizes understanding and agrees to plan.  Medications - No data to display  Labs Review Labs Reviewed - No data to display  Imaging Review No results found.   EKG Interpretation None      MDM   Final diagnoses:  Irritation of both eyes  Rash  Patient presents today with bilateral eye irritation.  No signs of infection.  No vision changes.  Eyes appear irritated.  Patient frequently rubbing and itching his eyes.  Feel that the patient is stable for discharge.  Patient also evaluated by Dr. Wilson Singer.  Return precautions given.  I personally performed the services described in this documentation, which was scribed in my presence. The recorded information has been reviewed and is accurate.    Hyman Bible, PA-C 05/05/15 2148  Virgel Manifold, MD 05/11/15 [phone]

## 2015-05-05 NOTE — Discharge Instructions (Signed)
It is recommended to not rub your eyes.  This is most likely making the irritation worse.

## 2015-05-05 NOTE — ED Notes (Signed)
Pt given soda and sandwich per request

## 2015-05-05 NOTE — ED Notes (Signed)
Call placed to facility to make aware of pt's return, per RN pt has a rash on his neck that needs to be looked at.  EDPA aware.

## 2015-07-06 ENCOUNTER — Emergency Department (HOSPITAL_COMMUNITY)
Admission: EM | Admit: 2015-07-06 | Discharge: 2015-07-06 | Disposition: A | Discharge status: Home / Self Care | Payer: Medicare Other | Attending: Emergency Medicine | Admitting: Emergency Medicine

## 2015-07-06 ENCOUNTER — Institutional Professional Consult (permissible substitution): Payer: Medicare Other | Admitting: Internal Medicine

## 2015-07-06 DIAGNOSIS — Z7982 Long term (current) use of aspirin: Secondary | ICD-10-CM | POA: Diagnosis not present

## 2015-07-06 DIAGNOSIS — Z791 Long term (current) use of non-steroidal anti-inflammatories (NSAID): Secondary | ICD-10-CM | POA: Diagnosis not present

## 2015-07-06 DIAGNOSIS — Z85528 Personal history of other malignant neoplasm of kidney: Secondary | ICD-10-CM | POA: Diagnosis not present

## 2015-07-06 DIAGNOSIS — Z7902 Long term (current) use of antithrombotics/antiplatelets: Secondary | ICD-10-CM | POA: Diagnosis not present

## 2015-07-06 DIAGNOSIS — S81801A Unspecified open wound, right lower leg, initial encounter: Secondary | ICD-10-CM | POA: Insufficient documentation

## 2015-07-06 DIAGNOSIS — Y999 Unspecified external cause status: Secondary | ICD-10-CM | POA: Insufficient documentation

## 2015-07-06 DIAGNOSIS — I2510 Atherosclerotic heart disease of native coronary artery without angina pectoris: Secondary | ICD-10-CM | POA: Insufficient documentation

## 2015-07-06 DIAGNOSIS — S81802A Unspecified open wound, left lower leg, initial encounter: Secondary | ICD-10-CM | POA: Insufficient documentation

## 2015-07-06 DIAGNOSIS — Z7951 Long term (current) use of inhaled steroids: Secondary | ICD-10-CM | POA: Diagnosis not present

## 2015-07-06 DIAGNOSIS — L089 Local infection of the skin and subcutaneous tissue, unspecified: Secondary | ICD-10-CM | POA: Diagnosis present

## 2015-07-06 DIAGNOSIS — Q859 Phakomatosis, unspecified: Secondary | ICD-10-CM | POA: Diagnosis not present

## 2015-07-06 DIAGNOSIS — Z79899 Other long term (current) drug therapy: Secondary | ICD-10-CM | POA: Diagnosis not present

## 2015-07-06 DIAGNOSIS — E662 Morbid (severe) obesity with alveolar hypoventilation: Secondary | ICD-10-CM | POA: Diagnosis not present

## 2015-07-06 DIAGNOSIS — E785 Hyperlipidemia, unspecified: Secondary | ICD-10-CM | POA: Insufficient documentation

## 2015-07-06 DIAGNOSIS — J449 Chronic obstructive pulmonary disease, unspecified: Secondary | ICD-10-CM | POA: Insufficient documentation

## 2015-07-06 DIAGNOSIS — I10 Essential (primary) hypertension: Secondary | ICD-10-CM | POA: Diagnosis not present

## 2015-07-06 DIAGNOSIS — Y939 Activity, unspecified: Secondary | ICD-10-CM | POA: Diagnosis not present

## 2015-07-06 DIAGNOSIS — X58XXXA Exposure to other specified factors, initial encounter: Secondary | ICD-10-CM | POA: Diagnosis not present

## 2015-07-06 DIAGNOSIS — G309 Alzheimer's disease, unspecified: Secondary | ICD-10-CM | POA: Diagnosis not present

## 2015-07-06 DIAGNOSIS — M1990 Unspecified osteoarthritis, unspecified site: Secondary | ICD-10-CM | POA: Insufficient documentation

## 2015-07-06 DIAGNOSIS — Y929 Unspecified place or not applicable: Secondary | ICD-10-CM | POA: Diagnosis not present

## 2015-07-06 DIAGNOSIS — Z7952 Long term (current) use of systemic steroids: Secondary | ICD-10-CM | POA: Insufficient documentation

## 2015-07-06 DIAGNOSIS — F0280 Dementia in other diseases classified elsewhere without behavioral disturbance: Secondary | ICD-10-CM | POA: Insufficient documentation

## 2015-07-06 LAB — BASIC METABOLIC PANEL
ANION GAP: 7 (ref 5–15)
BUN: 22 mg/dL — ABNORMAL HIGH (ref 6–20)
CALCIUM: 8 mg/dL — AB (ref 8.9–10.3)
CO2: 32 mmol/L (ref 22–32)
Chloride: 100 mmol/L — ABNORMAL LOW (ref 101–111)
Creatinine, Ser: 1.22 mg/dL (ref 0.61–1.24)
GFR, EST NON AFRICAN AMERICAN: 56 mL/min — AB (ref >60)
Glucose, Bld: 95 mg/dL (ref 65–99)
Potassium: 4.2 mmol/L (ref 3.5–5.1)
SODIUM: 139 mmol/L (ref 135–145)

## 2015-07-06 LAB — CBC
HCT: 35.9 % — ABNORMAL LOW (ref 39.0–52.0)
Hemoglobin: 11.7 g/dL — ABNORMAL LOW (ref 13.0–17.0)
MCH: 33.1 pg (ref 26.0–34.0)
MCHC: 32.6 g/dL (ref 30.0–36.0)
MCV: 101.4 fL — ABNORMAL HIGH (ref 78.0–100.0)
PLATELETS: 169 10*3/uL (ref 150–400)
RBC: 3.54 MIL/uL — ABNORMAL LOW (ref 4.22–5.81)
RDW: 14.1 % (ref 11.5–15.5)
WBC: 7.5 10*3/uL (ref 4.0–10.5)

## 2015-07-06 MED ORDER — DOXYCYCLINE HYCLATE 100 MG PO CAPS: 100.0000 mg | ORAL_CAPSULE | Freq: Two times a day (BID) | ORAL | Status: DC

## 2015-07-06 MED ORDER — AMOXICILLIN 500 MG PO CAPS: 500.0000 mg | ORAL_CAPSULE | Freq: Three times a day (TID) | ORAL | Status: DC

## 2015-07-06 NOTE — ED Provider Notes (Signed)
CSN: [id number]     Arrival date & time 07/06/15  1155 History   First MD Initiated Contact with Patient 07/06/15 1504     Chief Complaint  Patient presents with  . Recurrent Skin Infections     (Consider location/radiation/quality/duration/timing/severity/associated sxs/prior Treatment) HPI Comments: 74 y.o. Male with history of DM, CAD, COPD presents for leg wounds.  The patient reports that he has had swollen legs for a long time and that they seem to get worse when he sits for long periods of time which he has been doing more frequently.  He says that over the last few days he has developed wounds that that are draining clear fluid.  He says he otherwise feels well.  No fevers, chills, nausea, vomiting.  No trauma or injury. Denies chest pain, shortness of breath. The patient denies having wounds like this before.  The patient was sent in from his nursing facility because they said they could not care for or dress the wounds without an order from a physician.     Past Medical History  Diagnosis Date  . Other congenital hamartoses, not elsewhere classified   . Family history of ischemic heart disease   . Family history of malignant neoplasm of gastrointestinal tract   . Allergic rhinitis, cause unspecified   . Other and unspecified hyperlipidemia   . Unspecified essential hypertension   . Obesity hypoventilation syndrome     failed cpap/bipap trial  . Hypothyroidism   . GERD (gastroesophageal reflux disease)   . Arthritis     osteoarthritis  . DEMENTIA     Alzheimer's, senile dementia  . Shortness of breath   . Cancer     renal carcinoma  . Sleep apnea     OXYGEN 24 HR/DAY-NO CPAP  . COPD (chronic obstructive pulmonary disease)     [name] FOLLOWS PT--PT USING OXYGEN 24 HRS A DAY  . COPD (chronic obstructive pulmonary disease)     PULMONARY CLEARANCE FOR LEFT RENAL CRYOABLATION ON CHART FROM DR. CLANCE -07/26/11  . Coronary artery disease     CARDIAC CLEARANCE   OFFICE NOTE FOR LEFT CRYO ABLATION ON CHART FROM [name] -"RECENT STRESS TEST DID NOT SHOW ISCHEMIA" -"PRESUMED CAD DUE TO CORONARY CALCIFICATIONS"    Past Surgical History  Procedure Laterality Date  . Eye surgery      bilateral cataracts  . Joint replacement      bilateral knee replacements  . Carpal tunnel release      right  . Thyroidectomy    . Cholecystectomy  2009  . Knee arthroplasty      2005 LEFT   AND 2007 RIGHT--[name]  . Tumor removal  09/14/2011   Family History  Problem Relation Age of Onset  . Cancer Other     colon  . Heart disease Other     CAD, MI and Heart Attack   Social History  Substance Use Topics  . Smoking status: Never Smoker   . Smokeless tobacco: Never Used  . Alcohol Use: No    Review of Systems  Constitutional: Negative for fever, chills and fatigue.  HENT: Negative for congestion, postnasal drip and rhinorrhea.   Eyes: Negative for pain and redness.  Respiratory: Negative for cough, chest tightness and shortness of breath.   Cardiovascular: Positive for leg swelling (chronic). Negative for chest pain and palpitations.  Gastrointestinal: Negative for nausea, vomiting and abdominal pain.  Genitourinary: Negative for dysuria, urgency and hematuria.  Musculoskeletal: Negative for myalgias  and back pain.  Skin: Positive for wound.  Neurological: Negative for weakness and headaches.  Hematological: Does not bruise/bleed easily.      Allergies  Review of patient's allergies indicates no known allergies.  Home Medications   Prior to Admission medications   Medication Sig Start Date End Date Taking? Authorizing Provider  acetaminophen (TYLENOL) 500 MG tablet Take 500 mg by mouth 3 (three) times daily. Scheduled   Yes Historical Provider, MD  albuterol (PROVENTIL) (2.5 MG/3ML) 0.083% nebulizer solution Take 2.5 mg by nebulization every 6 (six) hours as needed for wheezing or shortness of breath.   Yes Historical Provider, MD    aspirin 81 MG chewable tablet Chew 81 mg by mouth daily.   Yes Historical Provider, MD  atorvastatin (LIPITOR) 20 MG tablet Take 20 mg by mouth daily.   Yes Historical Provider, MD  bisacodyl (DULCOLAX) 10 MG suppository Place 10 mg rectally daily as needed for moderate constipation.   Yes Historical Provider, MD  budesonide-formoterol (SYMBICORT) 160-4.5 MCG/ACT inhaler Inhale 2 puffs into the lungs 2 (two) times daily.   Yes Historical Provider, MD  calcitRIOL (ROCALTROL) 0.5 MCG capsule Take 0.5 mcg by mouth daily.   Yes Historical Provider, MD  calcium carbonate (TUMS) 500 MG chewable tablet Chew 2 tablets by mouth daily with breakfast.   Yes Historical Provider, MD  clopidogrel (PLAVIX) 75 MG tablet Take 75 mg by mouth every morning.    Yes Historical Provider, MD  cyanocobalamin 500 MCG tablet Take 500 mcg by mouth daily.   Yes Historical Provider, MD  cycloSPORINE (RESTASIS) 0.05 % ophthalmic emulsion Place 1 drop into both eyes 2 (two) times daily.    Yes Historical Provider, MD  diclofenac sodium (VOLTAREN) 1 % GEL Apply 2 g topically 2 (two) times daily.    Yes Historical Provider, MD  docusate sodium (COLACE) 100 MG capsule Take 100 mg by mouth 2 (two) times daily as needed for mild constipation or moderate constipation.   Yes Historical Provider, MD  ferrous sulfate 325 (65 FE) MG tablet Take 325 mg by mouth daily with breakfast.   Yes Historical Provider, MD  fexofenadine (ALLEGRA) 180 MG tablet Take 180 mg by mouth daily.   Yes Historical Provider, MD  finasteride (PROSCAR) 5 MG tablet Take 5 mg by mouth daily.   Yes Historical Provider, MD  fluticasone (FLONASE) 50 MCG/ACT nasal spray Place 1 spray into the nose 2 (two) times daily.    Yes Historical Provider, MD  HYDROcodone-acetaminophen (NORCO/VICODIN) 5-325 MG per tablet Take 0.5 tablets by mouth 2 (two) times daily as needed for moderate pain.   Yes Historical Provider, MD  hydrocortisone cream 1 % Apply to affected area 2 times  daily Patient taking differently: Apply 1 application topically 2 (two) times daily. Apply to affected area 2 times daily 05/05/15  Yes Heather Laisure, PA-C  hydroxypropyl methylcellulose / hypromellose (ISOPTO TEARS / GONIOVISC) 2.5 % ophthalmic solution Place 1 drop into both eyes 2 (two) times daily.   Yes Historical Provider, MD  ipratropium (ATROVENT) 0.02 % nebulizer solution Take 500 mcg by nebulization every 4 (four) hours as needed for wheezing or shortness of breath.    Yes Historical Provider, MD  levothyroxine (SYNTHROID, LEVOTHROID) 200 MCG tablet Take 200 mcg by mouth daily.    Yes Historical Provider, MD  metoprolol tartrate (LOPRESSOR) 25 MG tablet Take 12.5 mg by mouth 2 (two) times daily. Hold for systolic blood pressure <818   Yes Historical Provider, MD  neomycin-bacitracin-polymyxin (  NEOSPORIN) [phone] ointment Apply 1 application topically 2 (two) times daily as needed (for irritation).   Yes Historical Provider, MD  nystatin (MYCOSTATIN/NYSTOP) 100000 UNIT/GM POWD Apply 1 g topically 2 (two) times daily as needed (groin irrtation).    Yes Historical Provider, MD  OXYGEN Inhale into the lungs at bedtime.   Yes Historical Provider, MD  pantoprazole (PROTONIX) 40 MG tablet Take 40 mg by mouth daily.    Yes Historical Provider, MD  polyethylene glycol powder (GLYCOLAX/MIRALAX) powder Take 1 Container by mouth every other day. Mix 1 capful (17g) in 8 ounces of fluid and drink   Yes Historical Provider, MD  pregabalin (LYRICA) 75 MG capsule Take 75 mg by mouth 2 (two) times daily.   Yes Historical Provider, MD  senna-docusate (SENOKOT-S) 8.6-50 MG per tablet Take 3 tablets by mouth 2 (two) times daily.    Yes Historical Provider, MD  sodium chloride (OCEAN) 0.65 % SOLN nasal spray Place 1 spray into both nostrils every 2 (two) hours as needed for congestion.   Yes Historical Provider, MD  Tamsulosin HCl (FLOMAX) 0.4 MG CAPS Take 0.4 mg by mouth daily. Hold if SBP < 100   Yes  Historical Provider, MD  traZODone (DESYREL) 150 MG tablet Take 300 mg by mouth at bedtime.   Yes Historical Provider, MD  venlafaxine (EFFEXOR) 75 MG tablet Take 75 mg by mouth daily.   Yes Historical Provider, MD   BP 125/67 mmHg  Pulse 62  Temp(Src) 98.5 F (36.9 C) (Oral)  Resp 20  Ht 6\' 3"  (1.905 m)  Wt 295 lb (133.811 kg)  BMI 36.87 kg/m2  SpO2 98% Physical Exam  Constitutional: He is oriented to person, place, and time. No distress.  HENT:  Head: Normocephalic and atraumatic.  Neck: Normal range of motion. Neck supple.  Cardiovascular: Normal rate, regular rhythm and intact distal pulses.   Pulmonary/Chest: Effort normal. No respiratory distress. He has no wheezes. He has no rales.  Abdominal: Soft. He exhibits no distension. There is no tenderness.  Musculoskeletal: He exhibits edema (2+, pitting, symmetric edema of the bilateral lower extremities).  Neurological: He is alert and oriented to person, place, and time.  Skin: He is not diaphoretic.     Over the bilateral shins there are open lesions with draining clear fluid.  Minimal redness surrounding the wounds.  No erythema or warmth.    Vitals reviewed.   ED Course  Procedures (including critical care time) Labs Review Labs Reviewed  CBC - Abnormal; Notable for the following:    RBC 3.54 (*)    Hemoglobin 11.7 (*)    HCT 35.9 (*)    MCV 101.4 (*)    All other components within normal limits  BASIC METABOLIC PANEL - Abnormal; Notable for the following:    Chloride 100 (*)    BUN 22 (*)    Calcium 8.0 (*)    GFR calc non Af Amer 56 (*)    All other components within normal limits    Imaging Review No results found. I have personally reviewed and evaluated these images and lab results as part of my medical decision-making.   EKG Interpretation None      MDM  Patient was seen and evaluated in stable condition.  Wounds appear secondary to chronic edema, veinous insufficiency.  Blood work revealed  borderline Cr and patient with history of elevated Cr and so patient not started on lasix.  Wounds were cleaned and dressed.  No convincing sign of  acute infection although some redness appreciated and patient with risk factors for infection.  Normal WBC.  Patient was instructed to elevate his legs and to not sit for extended periods of time.  He was told to follow up with his primary care physician and was given a prescription for wound care at his nursing facility.  The patient expressed understanding and agreement with plan of care.  Patient was discharged home in stable condition. Final diagnoses:  None    1. Bilateral leg wounds  2. Peripheral edema    Harvel Quale, MD 07/07/15 [phone]

## 2015-07-06 NOTE — ED Notes (Signed)
Patient waiting on PTAR. 

## 2015-07-06 NOTE — Discharge Instructions (Signed)
Peripheral Edema Please, elevate your legs as frequently as possible.  Do not sit for long periods of time.  Use compression stockings as able. You have swelling in your legs (peripheral edema). This swelling is due to excess accumulation of salt and water in your body. Edema may be a sign of heart, kidney or liver disease, or a side effect of a medication. It may also be due to problems in the leg veins. Elevating your legs and using special support stockings may be very helpful, if the cause of the swelling is due to poor venous circulation. Avoid long periods of standing, whatever the cause. Treatment of edema depends on identifying the cause. Chips, pretzels, pickles and other salty foods should be avoided. Restricting salt in your diet is almost always needed. Water pills (diuretics) are often used to remove the excess salt and water from your body via urine. These medicines prevent the kidney from reabsorbing sodium. This increases urine flow. Diuretic treatment may also result in lowering of potassium levels in your body. Potassium supplements may be needed if you have to use diuretics daily. Daily weights can help you keep track of your progress in clearing your edema. You should call your caregiver for follow up care as recommended. SEEK IMMEDIATE MEDICAL CARE IF:   You have increased swelling, pain, redness, or heat in your legs.  You develop shortness of breath, especially when lying down.  You develop chest or abdominal pain, weakness, or fainting.  You have a fever. Document Released: 11/14/2004 Document Revised: 12/30/2011 Document Reviewed: 10/25/2009 [facility] Patient Information 2015 [place], [place]. This information is not intended to replace advice given to you by your health care provider. Make sure you discuss any questions you have with your health care provider.  Wound Care Change your wound dressings every day and anytime they appear saturated or dirty. Wound care helps  prevent pain and infection.  You may need a tetanus shot if:  You cannot remember when you had your last tetanus shot.  You have never had a tetanus shot.  The injury broke your skin. If you need a tetanus shot and you choose not to have one, you may get tetanus. Sickness from tetanus can be serious. HOME CARE   Only take medicine as told by your doctor.  Clean the wound daily with mild soap and water.  Change any bandages (dressings) as told by your doctor.  Put medicated cream and a bandage on the wound as told by your doctor.  Change the bandage if it gets wet, dirty, or starts to smell.  Take showers. Do not take baths, swim, or do anything that puts your wound under water.  Rest and raise (elevate) the wound until the pain and puffiness (swelling) are better.  Keep all doctor visits as told. GET HELP RIGHT AWAY IF:   Yellowish-white fluid (pus) comes from the wound.  Medicine does not lessen your pain.  There is a red streak going away from the wound.  You have a fever. MAKE SURE YOU:   Understand these instructions.  Will watch your condition.  Will get help right away if you are not doing well or get worse. Document Released: 07/16/2008 Document Revised: 12/30/2011 Document Reviewed: 02/10/2011 [facility] Patient Information 2015 [place], [place]. This information is not intended to replace advice given to you by your health care provider. Make sure you discuss any questions you have with your health care provider.

## 2015-07-06 NOTE — ED Notes (Signed)
Pt has right and left leg skin abrasions with redness, edema, and oozing. bp 90/51. Pt is alert x3.  Pt states has rash on his underside. assessment in progress

## 2015-07-06 NOTE — ED Notes (Signed)
Patient is from a skilled facility with a complaint of wounds to both legs. According to the patient he has had them for months. The doctor for the facility "quit" according to nursing staff, so they are unable to do anything for the patient because they have no orders. Patient denies pain and is alert and oriented times 4.

## 2015-07-10 ENCOUNTER — Emergency Department (HOSPITAL_COMMUNITY): Payer: Medicare Other

## 2015-07-10 ENCOUNTER — Encounter (HOSPITAL_COMMUNITY): Payer: Self-pay | Admitting: Emergency Medicine

## 2015-07-10 ENCOUNTER — Emergency Department (HOSPITAL_COMMUNITY)
Admission: EM | Admit: 2015-07-10 | Discharge: 2015-07-11 | Disposition: A | Discharge status: Home / Self Care | Payer: Medicare Other | Attending: Emergency Medicine | Admitting: Emergency Medicine

## 2015-07-10 DIAGNOSIS — F0390 Unspecified dementia without behavioral disturbance: Secondary | ICD-10-CM | POA: Diagnosis not present

## 2015-07-10 DIAGNOSIS — I2510 Atherosclerotic heart disease of native coronary artery without angina pectoris: Secondary | ICD-10-CM | POA: Insufficient documentation

## 2015-07-10 DIAGNOSIS — R109 Unspecified abdominal pain: Secondary | ICD-10-CM

## 2015-07-10 DIAGNOSIS — I10 Essential (primary) hypertension: Secondary | ICD-10-CM | POA: Diagnosis not present

## 2015-07-10 DIAGNOSIS — Z85528 Personal history of other malignant neoplasm of kidney: Secondary | ICD-10-CM | POA: Diagnosis not present

## 2015-07-10 DIAGNOSIS — E039 Hypothyroidism, unspecified: Secondary | ICD-10-CM | POA: Insufficient documentation

## 2015-07-10 DIAGNOSIS — Z7951 Long term (current) use of inhaled steroids: Secondary | ICD-10-CM | POA: Diagnosis not present

## 2015-07-10 DIAGNOSIS — Z7982 Long term (current) use of aspirin: Secondary | ICD-10-CM | POA: Insufficient documentation

## 2015-07-10 DIAGNOSIS — K529 Noninfective gastroenteritis and colitis, unspecified: Secondary | ICD-10-CM | POA: Insufficient documentation

## 2015-07-10 DIAGNOSIS — Z79899 Other long term (current) drug therapy: Secondary | ICD-10-CM | POA: Insufficient documentation

## 2015-07-10 DIAGNOSIS — Z7902 Long term (current) use of antithrombotics/antiplatelets: Secondary | ICD-10-CM | POA: Insufficient documentation

## 2015-07-10 DIAGNOSIS — J441 Chronic obstructive pulmonary disease with (acute) exacerbation: Secondary | ICD-10-CM | POA: Diagnosis not present

## 2015-07-10 DIAGNOSIS — E785 Hyperlipidemia, unspecified: Secondary | ICD-10-CM | POA: Diagnosis not present

## 2015-07-10 DIAGNOSIS — M1990 Unspecified osteoarthritis, unspecified site: Secondary | ICD-10-CM | POA: Diagnosis not present

## 2015-07-10 DIAGNOSIS — Z7952 Long term (current) use of systemic steroids: Secondary | ICD-10-CM | POA: Insufficient documentation

## 2015-07-10 DIAGNOSIS — Q859 Phakomatosis, unspecified: Secondary | ICD-10-CM | POA: Diagnosis not present

## 2015-07-10 DIAGNOSIS — Z792 Long term (current) use of antibiotics: Secondary | ICD-10-CM | POA: Diagnosis not present

## 2015-07-10 LAB — COMPREHENSIVE METABOLIC PANEL
ALBUMIN: 3.3 g/dL — AB (ref 3.5–5.0)
ALT: 13 U/L — ABNORMAL LOW (ref 17–63)
AST: 21 U/L (ref 15–41)
Alkaline Phosphatase: 68 U/L (ref 38–126)
Anion gap: 6 (ref 5–15)
BILIRUBIN TOTAL: 0.4 mg/dL (ref 0.3–1.2)
BUN: 23 mg/dL — ABNORMAL HIGH (ref 6–20)
CHLORIDE: 103 mmol/L (ref 101–111)
CO2: 31 mmol/L (ref 22–32)
Calcium: 7.7 mg/dL — ABNORMAL LOW (ref 8.9–10.3)
Creatinine, Ser: 1.26 mg/dL — ABNORMAL HIGH (ref 0.61–1.24)
GFR calc Af Amer: >60 mL/min (ref >60)
GFR calc non Af Amer: 54 mL/min — ABNORMAL LOW (ref >60)
GLUCOSE: 96 mg/dL (ref 65–99)
POTASSIUM: 4 mmol/L (ref 3.5–5.1)
SODIUM: 140 mmol/L (ref 135–145)
TOTAL PROTEIN: 5.9 g/dL — AB (ref 6.5–8.1)

## 2015-07-10 LAB — CBC
HCT: 34.5 % — ABNORMAL LOW (ref 39.0–52.0)
Hemoglobin: 11.2 g/dL — ABNORMAL LOW (ref 13.0–17.0)
MCH: 33 pg (ref 26.0–34.0)
MCHC: 32.5 g/dL (ref 30.0–36.0)
MCV: 101.8 fL — ABNORMAL HIGH (ref 78.0–100.0)
Platelets: 162 10*3/uL (ref 150–400)
RBC: 3.39 MIL/uL — ABNORMAL LOW (ref 4.22–5.81)
RDW: 14.4 % (ref 11.5–15.5)
WBC: 8.1 10*3/uL (ref 4.0–10.5)

## 2015-07-10 LAB — URINALYSIS, ROUTINE W REFLEX MICROSCOPIC
Bilirubin Urine: NEGATIVE
GLUCOSE, UA: NEGATIVE mg/dL
HGB URINE DIPSTICK: NEGATIVE
Ketones, ur: NEGATIVE mg/dL
Leukocytes, UA: NEGATIVE
Nitrite: NEGATIVE
PROTEIN: NEGATIVE mg/dL
Specific Gravity, Urine: 1.026 (ref 1.005–1.030)
UROBILINOGEN UA: 0.2 mg/dL (ref 0.0–1.0)
pH: 6.5 (ref 5.0–8.0)

## 2015-07-10 MED ORDER — IOHEXOL 300 MG/ML  SOLN
25.0000 mL | Freq: Once | INTRAMUSCULAR | Status: AC | PRN
  Administered 2015-07-10: 25 mL via ORAL

## 2015-07-10 NOTE — Progress Notes (Signed)
CSW met with patient at bedside. There was no family present. Patient confirms that he presents to [facility] due to flank pain. Patient confirms that he is from [facility]. He states he has been living at the facility for a year.  Patient informed CSW that he receives assistance completing his ADL's. He denies falling often. Patient states that he has not fallen within the past 6 months.  According to patient, his daughter Clemencia Course is his POA. Patient states that his daughter visits him " as often as she can". He says that he feels as though he has a good support sytem.  Patient states that he feels better than when he first came to [facility]. Patient informed CSW that upon discharge he would feel safe to go back to facility. Patient expressed no concerns at this time.  Willette Brace [phone] ED CSW 07/10/2015 9:00 PM

## 2015-07-10 NOTE — ED Notes (Addendum)
Per EMS, pt from [facility] c/o L flank pain since this afternoon. Pain is intermittent. No pain at this time. Denies hx of kidney stones. Pt has chronic kidney disease and kidney failure. A&Ox4. Pt ambulatory with walker. Pt is on 3L o2 24 hours/day.

## 2015-07-10 NOTE — ED Provider Notes (Signed)
CSN: [id number]     Arrival date & time 07/10/15  1606 History   First MD Initiated Contact with Patient 07/10/15 1630     Chief Complaint  Patient presents with  . Flank Pain    HPI [name] [name] is a 74 y.o. male with medical history significant for dementia, COPD, and renal carcinoma, who presents to ED with complaints of left sided abdominal pain. Patient states that sharp intermittent pains started shortly after lunch today. He rates the pain as 7/10. He denies any injury to area. Pain exacerbated with movement but does occur at rest.   Of note, patient was unaware of renal carcinoma that was on problem list.   Past Medical History  Diagnosis Date  . Other congenital hamartoses, not elsewhere classified   . Family history of ischemic heart disease   . Family history of malignant neoplasm of gastrointestinal tract   . Allergic rhinitis, cause unspecified   . Other and unspecified hyperlipidemia   . Unspecified essential hypertension   . Obesity hypoventilation syndrome     failed cpap/bipap trial  . Hypothyroidism   . GERD (gastroesophageal reflux disease)   . Arthritis     osteoarthritis  . DEMENTIA     Alzheimer's, senile dementia  . Shortness of breath   . Cancer     renal carcinoma  . Sleep apnea     OXYGEN 24 HR/DAY-NO CPAP  . COPD (chronic obstructive pulmonary disease)     [name] FOLLOWS PT--PT USING OXYGEN 24 HRS A DAY  . COPD (chronic obstructive pulmonary disease)     PULMONARY CLEARANCE FOR LEFT RENAL CRYOABLATION ON CHART FROM DR. CLANCE -07/26/11  . Coronary artery disease     CARDIAC CLEARANCE  OFFICE NOTE FOR LEFT CRYO ABLATION ON CHART FROM [name] -"RECENT STRESS TEST DID NOT SHOW ISCHEMIA" -"PRESUMED CAD DUE TO CORONARY CALCIFICATIONS"    Past Surgical History  Procedure Laterality Date  . Eye surgery      bilateral cataracts  . Joint replacement      bilateral knee replacements  . Carpal tunnel release      right  . Thyroidectomy     . Cholecystectomy  2009  . Knee arthroplasty      2005 LEFT   AND 2007 RIGHT--[name]  . Tumor removal  09/14/2011   Family History  Problem Relation Age of Onset  . Cancer Other     colon  . Heart disease Other     CAD, MI and Heart Attack   Social History  Substance Use Topics  . Smoking status: Never Smoker   . Smokeless tobacco: Never Used  . Alcohol Use: No    Review of Systems  Constitutional: Negative for fever.  Respiratory: Positive for shortness of breath.   Cardiovascular: Negative for chest pain.  Gastrointestinal: Positive for abdominal pain.  Genitourinary: Negative for dysuria.  Also per HPI  Allergies  Review of patient's allergies indicates no known allergies.  Home Medications   Prior to Admission medications   Medication Sig Start Date End Date Taking? Authorizing Provider  acetaminophen (TYLENOL) 500 MG tablet Take 500 mg by mouth 3 (three) times daily. Scheduled   Yes Historical Provider, MD  amoxicillin (AMOXIL) 500 MG capsule Take 1 capsule (500 mg total) by mouth 3 (three) times daily. 07/06/15  Yes Harvel Quale, MD  aspirin 81 MG chewable tablet Chew 81 mg by mouth daily.   Yes Historical Provider, MD  atorvastatin (LIPITOR)  20 MG tablet Take 20 mg by mouth at bedtime.    Yes Historical Provider, MD  budesonide-formoterol (SYMBICORT) 160-4.5 MCG/ACT inhaler Inhale 2 puffs into the lungs 2 (two) times daily.   Yes Historical Provider, MD  calcitRIOL (ROCALTROL) 0.5 MCG capsule Take 0.5 mcg by mouth daily.   Yes Historical Provider, MD  calcium carbonate (TUMS) 500 MG chewable tablet Chew 2 tablets by mouth daily with breakfast.   Yes Historical Provider, MD  clopidogrel (PLAVIX) 75 MG tablet Take 75 mg by mouth every morning.    Yes Historical Provider, MD  cyanocobalamin 500 MCG tablet Take 500 mcg by mouth daily.   Yes Historical Provider, MD  cycloSPORINE (RESTASIS) 0.05 % ophthalmic emulsion Place 1 drop into both eyes 2 (two) times  daily.    Yes Historical Provider, MD  diclofenac sodium (VOLTAREN) 1 % GEL Apply 2 g topically 2 (two) times daily.    Yes Historical Provider, MD  doxycycline (VIBRAMYCIN) 100 MG capsule Take 1 capsule (100 mg total) by mouth 2 (two) times daily. 07/06/15  Yes Harvel Quale, MD  ferrous sulfate 325 (65 FE) MG tablet Take 325 mg by mouth daily with breakfast.   Yes Historical Provider, MD  fexofenadine (ALLEGRA) 180 MG tablet Take 180 mg by mouth daily.   Yes Historical Provider, MD  finasteride (PROSCAR) 5 MG tablet Take 5 mg by mouth daily.   Yes Historical Provider, MD  fluticasone (FLONASE) 50 MCG/ACT nasal spray Place 1 spray into the nose 2 (two) times daily.    Yes Historical Provider, MD  HYDROcodone-acetaminophen (NORCO/VICODIN) 5-325 MG per tablet Take 0.5 tablets by mouth 2 (two) times daily as needed for moderate pain.   Yes Historical Provider, MD  levothyroxine (SYNTHROID, LEVOTHROID) 200 MCG tablet Take 200 mcg by mouth daily.    Yes Historical Provider, MD  Melatonin 5 MG TABS Take 1 tablet by mouth at bedtime.   Yes Historical Provider, MD  metoprolol tartrate (LOPRESSOR) 25 MG tablet Take 12.5 mg by mouth 2 (two) times daily. Hold for systolic blood pressure <782   Yes Historical Provider, MD  pantoprazole (PROTONIX) 40 MG tablet Take 40 mg by mouth daily.    Yes Historical Provider, MD  polyvinyl alcohol (LIQUIFILM TEARS) 1.4 % ophthalmic solution Place 1 drop into both eyes 2 (two) times daily.   Yes Historical Provider, MD  pregabalin (LYRICA) 75 MG capsule Take 75 mg by mouth 2 (two) times daily.   Yes Historical Provider, MD  senna-docusate (SENOKOT-S) 8.6-50 MG per tablet Take 3 tablets by mouth 2 (two) times daily.    Yes Historical Provider, MD  Tamsulosin HCl (FLOMAX) 0.4 MG CAPS Take 0.4 mg by mouth daily. Hold if SBP < 100   Yes Historical Provider, MD  traZODone (DESYREL) 150 MG tablet Take 300 mg by mouth at bedtime.   Yes Historical Provider, MD  venlafaxine  (EFFEXOR) 75 MG tablet Take 75 mg by mouth daily.   Yes Historical Provider, MD  albuterol (PROVENTIL) (2.5 MG/3ML) 0.083% nebulizer solution Take 2.5 mg by nebulization every 6 (six) hours as needed for wheezing or shortness of breath.    Historical Provider, MD  bisacodyl (DULCOLAX) 10 MG suppository Place 10 mg rectally daily as needed for moderate constipation.    Historical Provider, MD  docusate sodium (COLACE) 100 MG capsule Take 100 mg by mouth 2 (two) times daily as needed for mild constipation or moderate constipation.    Historical Provider, MD  hydrocortisone cream 1 %  Apply to affected area 2 times daily Patient taking differently: Apply 1 application topically 2 (two) times daily. Apply to affected area 2 times daily 05/05/15   Hyman Bible, PA-C  hydroxypropyl methylcellulose / hypromellose (ISOPTO TEARS / GONIOVISC) 2.5 % ophthalmic solution Place 1 drop into both eyes 2 (two) times daily.    Historical Provider, MD  ipratropium (ATROVENT) 0.02 % nebulizer solution Take 500 mcg by nebulization every 4 (four) hours as needed for wheezing or shortness of breath.     Historical Provider, MD  metroNIDAZOLE (FLAGYL) 500 MG tablet Take 1 tablet (500 mg total) by mouth 3 (three) times daily. 07/11/15   Katheren Shams, DO  neomycin-bacitracin-polymyxin (NEOSPORIN) [phone] ointment Apply 1 application topically 2 (two) times daily as needed (for irritation).    Historical Provider, MD  nystatin (MYCOSTATIN/NYSTOP) 100000 UNIT/GM POWD Apply 1 g topically 2 (two) times daily as needed (groin irrtation).     Historical Provider, MD  OXYGEN Inhale into the lungs at bedtime.    Historical Provider, MD  polyethylene glycol powder (GLYCOLAX/MIRALAX) powder Take 1 Container by mouth every other day. Mix 1 capful (17g) in 8 ounces of fluid and drink    Historical Provider, MD  sodium chloride (OCEAN) 0.65 % SOLN nasal spray Place 1 spray into both nostrils every 2 (two) hours as needed for congestion.     Historical Provider, MD   BP 126/63 mmHg  Pulse 64  Temp(Src) 98.5 F (36.9 C) (Oral)  Resp 20  SpO2 100% Physical Exam  Constitutional: He appears well-developed and well-nourished. Nasal cannula in place.  HENT:  Head: Normocephalic and atraumatic.  Eyes: EOM are normal.  Cardiovascular: Normal rate and regular rhythm.   Pulmonary/Chest: Effort normal and breath sounds normal. No respiratory distress. He has no wheezes.  Abdominal: Soft. Bowel sounds are normal. He exhibits no distension. There is no tenderness. There is no guarding.  Musculoskeletal: Normal range of motion. He exhibits edema.  Bilateral lower extremities with bandage wraps.   Neurological: He is alert.  Skin: Skin is warm and dry.    ED Course  Procedures (including critical care time) Labs Review Labs Reviewed  CBC - Abnormal; Notable for the following:    RBC 3.39 (*)    Hemoglobin 11.2 (*)    HCT 34.5 (*)    MCV 101.8 (*)    All other components within normal limits  COMPREHENSIVE METABOLIC PANEL - Abnormal; Notable for the following:    BUN 23 (*)    Creatinine, Ser 1.26 (*)    Calcium 7.7 (*)    Total Protein 5.9 (*)    Albumin 3.3 (*)    ALT 13 (*)    GFR calc non Af Amer 54 (*)    All other components within normal limits  URINALYSIS, ROUTINE W REFLEX MICROSCOPIC (NOT AT [facility])    Imaging Review Ct Abdomen Pelvis Wo Contrast  07/10/2015   CLINICAL DATA:  LEFT flank pain since this afternoon, intermittent pain, chronic kidney disease and kidney failure, dementia, COPD  EXAM: CT ABDOMEN AND PELVIS WITHOUT CONTRAST  TECHNIQUE: Multidetector CT imaging of the abdomen and pelvis was performed following the standard protocol without IV contrast. Sagittal and coronal MPR images reconstructed from axial data set. Dilute oral contrast was given.  COMPARISON:  06/24/2012  FINDINGS: Few tiny RIGHT lower lobe pulmonary nodule identified largest 5 mm diameter image 11.  Subsegmental atelectasis LEFT lower  lobe.  Area of abnormal opacification at the anterior basal segment  RIGHT lower lobe adjacent to the diaphragm again identified, approximately 3.9 x 1.8 x 4.7 cm.  This appears more prominent than was seen in 2013.  Finding is concerning for a peripheral low grade adenocarcinoma.  Tiny nonobstructing LEFT renal calculus.  Lesion at inferior pole LEFT kidney in identified containing central calcifications, measuring 3.1 x 2.7 cm image 57, corresponding to tumor which previously underwent cryoablation.  Surrounding infiltrative changes of the perinephric fat as result of cryoablation appear unchanged.  No definite hydronephrosis, ureteral calcification or ureteral dilatation.  Bladder and ureters unremarkable.  Gallbladder surgically absent.  Liver, spleen, pancreas and adrenal glands normal appearance for technique.  Small umbilical hernia containing a nonobstructed small bowel segment.  Suspected rectal and distal sigmoid wall thickening.  No additional bowel wall thickening or bowel dilatation identified.  Normal appendix.  No additional mass, adenopathy, free air or free fluid.  Scattered atherosclerotic calcifications aorta, branch vessels, and visualize coronary arteries.  Degenerative changes of both hip joints.  Multilevel degenerative disc and facet disease changes thoracolumbar spine.  IMPRESSION: Suspected rectal and distal sigmoid wall thickening concerning for colitis.  Stable post cryoablation changes at inferior pole of the LEFT kidney.  Tiny nonobstructing LEFT renal calculus.  Tiny nonspecific RIGHT lower lobe pulmonary nodules up to 5 mm diameter.  Chronic atelectasis LEFT lower lobe base.  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  Nonobstructed small bowel loop within a small umbilical hernia.   Electronically Signed   By: Lavonia Dana M.D.   On:  07/10/2015 23:54   I have personally reviewed and evaluated these images and lab results as part of my medical decision-making.   EKG Interpretation None     MDM   Final diagnoses:  Colitis  Abdominal pain, unspecified abdominal location   Patient presented to ED with abdominal pain. History was scattered and patient with dementia. Due to this CT abdomen was performed to further help illicit etiology. Patient found to have colitis.   Other labs in ED without significant findings.   Also of note, opacity seen on RLL that will need pulmonology follow-up as an outpatient for bronchoscopy.    Patient stable for discharge. Prescription for Flagyl given to treat colitis. Unable to confirm if it is a C. Diff colitis which is highly likely due to patient being on antibiotics and in a nursing facility. No stool collection in ED. Will need GI follow-up as well. Nursing facility patient returning to made aware (nurse to give report).    Luiz Blare, DO 07/11/2015, 12:44 AM PGY-2, [place], DO 07/11/15 0569  Leonard Schwartz, MD 07/13/15 [phone]

## 2015-07-10 NOTE — ED Notes (Signed)
Pt in CT.

## 2015-07-10 NOTE — ED Notes (Signed)
Bed: ID39 Expected date:  Expected time:  Means of arrival:  Comments: EMS- 74yo M, flank pain

## 2015-07-11 MED ORDER — METRONIDAZOLE 500 MG PO TABS: 500.0000 mg | ORAL_TABLET | Freq: Three times a day (TID) | ORAL | Status: DC

## 2015-07-11 NOTE — Discharge Instructions (Signed)
Patient had abdominal CT due to abdominal pain. CT showed colitits. 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 Needs Pulmonology follow-up Flagyl being given for colitis. IF patient starts to have foul smelling diarrhea would culture for C.diff (no stool in ED)   Colitis Colitis is inflammation of the colon. Colitis can be a short-term or long-standing (chronic) illness. Crohn's disease and ulcerative colitis are 2 types of colitis which are chronic. They usually require lifelong treatment. CAUSES  There are many different causes of colitis, including:  Viruses.  Germs (bacteria).  Medicine reactions. SYMPTOMS   Diarrhea.  Intestinal bleeding.  Pain.  Fever.  Throwing up (vomiting).  Tiredness (fatigue).  Weight loss.  Bowel blockage. DIAGNOSIS  The diagnosis of colitis is based on examination and stool or blood tests. X-rays, CT scan, and colonoscopy may also be needed. TREATMENT  Treatment may include:  Fluids given through the vein (intravenously).  Bowel rest (nothing to eat or drink for a period of time).  Medicine for pain and diarrhea.  Medicines (antibiotics) that kill germs.  Cortisone medicines.  Surgery. HOME CARE INSTRUCTIONS   Get plenty of rest.  Drink enough water and fluids to keep your urine clear or pale yellow.  Eat a well-balanced diet.  Call your caregiver for follow-up as recommended. SEEK IMMEDIATE MEDICAL CARE IF:   You develop chills.  You have an oral temperature above 102 F (38.9 C), not controlled by medicine.  You have extreme weakness, fainting, or dehydration.  You have repeated vomiting.  You develop severe belly (abdominal) pain or are passing bloody or tarry stools. MAKE SURE YOU:   Understand these instructions.  Will watch your condition.  Will get help right away if you are not doing well or get  worse. Document Released: 11/14/2004 Document Revised: 12/30/2011 Document Reviewed: 02/09/2010 [facility] Patient Information 2015 [place], [place]. This information is not intended to replace advice given to you by your health care provider. Make sure you discuss any questions you have with your health care provider.

## 2015-07-11 NOTE — ED Notes (Signed)
Notified [facility] for transportation back to facility [facility]). Spoke with Verdene Lennert, Med Tech with [facility] about discharge instructions and follow up.

## 2015-07-12 ENCOUNTER — Encounter: Payer: Self-pay | Admitting: Internal Medicine

## 2015-07-12 ENCOUNTER — Ambulatory Visit (INDEPENDENT_AMBULATORY_CARE_PROVIDER_SITE_OTHER): Payer: Medicare Other | Admitting: Internal Medicine

## 2015-07-12 ENCOUNTER — Ambulatory Visit (INDEPENDENT_AMBULATORY_CARE_PROVIDER_SITE_OTHER)
Admission: RE | Admit: 2015-07-12 | Discharge: 2015-07-12 | Disposition: A | Discharge status: Home / Self Care | Payer: Medicare Other | Source: Ambulatory Visit | Attending: Internal Medicine | Admitting: Internal Medicine

## 2015-07-12 VITALS — BP 126/80 | HR 84 | Ht 73.0 in | Wt 291.0 lb

## 2015-07-12 DIAGNOSIS — R918 Other nonspecific abnormal finding of lung field: Secondary | ICD-10-CM

## 2015-07-12 DIAGNOSIS — R0600 Dyspnea, unspecified: Secondary | ICD-10-CM

## 2015-07-12 DIAGNOSIS — E669 Obesity, unspecified: Secondary | ICD-10-CM | POA: Diagnosis not present

## 2015-07-12 DIAGNOSIS — R0609 Other forms of dyspnea: Secondary | ICD-10-CM

## 2015-07-12 NOTE — Progress Notes (Signed)
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 Pulmonary Consult    Referred by Timmothy Sours, NP. Pt c/o chronic SOB x 1 year, non-prod cough, right sided rib pain. Pt states recent PNA   extremely difficult hx/ Patient failed to answer a single question asked in a straightforward manner, tending to go off on tangents or answer questions with ambiguous medical terms or diagnoses and seemed aggravated  when asked the same question more than once for clarification.   Says sob x 50 ft (see walking study) gradually worse x one year despite symbicort 160 2bid/ no better with saba  Cough is dry, cp is not pleuritic  - no bloody or purulent mucus.  No obvious other patterns in day to day or daytime variabilty or assoc chronic cough or cp or chest tightness, subjective wheeze overt sinus or hb symptoms. No unusual exp hx or h/o childhood pna/ asthma or knowledge of premature birth.  Sleeping ok without nocturnal  or early am exacerbation  of respiratory  c/o's or need for noct saba. Also denies any obvious fluctuation of symptoms with weather or environmental changes or other aggravating or alleviating factors except as outlined above   Current Medications, Allergies, Complete Past Medical History, Past Surgical History, Family History, and Social History were reviewed in [employer] record.            Review of Systems  Constitutional: Negative for fever, chills, activity change, appetite change and unexpected weight change.  HENT: Negative for  congestion, dental problem, postnasal drip, rhinorrhea, sneezing, sore throat, trouble swallowing and voice change.   Eyes: Negative for visual disturbance.  Respiratory: Positive for cough, shortness of breath and wheezing. Negative for choking.   Cardiovascular: Negative for chest pain and leg swelling.  Gastrointestinal: Negative for nausea, vomiting and abdominal pain.  Genitourinary: Negative for difficulty urinating.  Musculoskeletal: Negative for arthralgias.  Skin: Negative for rash.  Psychiatric/Behavioral: Negative for behavioral problems and confusion.       Objective:   Physical Exam  amb wf with rolling walker nad talking a mile a minute  Wt Readings from Last 3 Encounters:  07/12/15 291 lb (131.997 kg)  07/06/15 295 lb (133.811 kg)  02/23/15 300 lb (136.079 kg)    Vital signs reviewed   HEENT: nl dentition, turbinates, and orophanx. Nl external ear canals without cough reflex   NECK :  without JVD/Nodes/TM/ nl carotid upstrokes bilaterally   LUNGS: no acc muscle use, clear to A and P bilaterally without cough on insp or exp maneuvers   CV:  RRR  no s3 or murmur or increase in P2, no edema   ABD:  soft and nontender with nl excursion in the supine position. 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 No active lung disease.       Assessment & Plan:

## 2015-07-12 NOTE — Patient Instructions (Addendum)
Please remember to go to the  x-ray department downstairs for your tests - we will call you with the results when they are available.  Please schedule a follow up visit in 3 months but call sooner if needed   add change to ov in 6 weeks with ct and pfts on return

## 2015-07-13 ENCOUNTER — Telehealth: Payer: Self-pay | Admitting: *Deleted

## 2015-07-13 NOTE — Telephone Encounter (Signed)
lmtcb for pt.  

## 2015-07-13 NOTE — Telephone Encounter (Signed)
Pt returning call.[name] [name] ° °

## 2015-07-13 NOTE — Assessment & Plan Note (Signed)
-   CT 06/2011 stable.  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

## 2015-07-13 NOTE — Assessment & Plan Note (Signed)
07/12/2015  Walked RA x 2 laps @ 185 ft each stopped due to  Ankle pain, no sob or sat < 90% at nl pace   Not clear why so sob but looks very debilitated > return with full pfts/ continue symb in meantime  I had an extended discussion with the patient reviewing all relevant studies completed to date and  lasting 35 minutes    Each maintenance medication was reviewed in detail including most importantly the difference between maintenance and prns and under what circumstances the prns are to be triggered using an action plan format that is not reflected in the computer generated alphabetically organized AVS.    Please see instructions for details which were reviewed in writing and the patient given a copy highlighting the part that I personally wrote and discussed at today's ov.

## 2015-07-13 NOTE — Telephone Encounter (Signed)
LMTCB

## 2015-07-13 NOTE — Assessment & Plan Note (Signed)
Body mass index is 38.4   Lab Results  Component Value Date   TSH 6.805* 01/17/2012     Contributing to gerd tendency/ doe/reviewed the need and the process to achieve and maintain neg calorie balance > defer f/u primary care including intermittently monitoring thyroid status

## 2015-07-13 NOTE — Telephone Encounter (Signed)
-----   Message from Tanda Rockers, MD sent at 07/13/2015  8:51 AM EDT ----- Ct s contrast and pfts on return in about 6 weeks (move up)

## 2015-07-14 NOTE — Progress Notes (Signed)
Quick Note:  LMTCB ______ 

## 2015-07-14 NOTE — Telephone Encounter (Signed)
Pt returned call [phone]

## 2015-07-14 NOTE — Telephone Encounter (Signed)
lmomtcb x1 for pt 

## 2015-07-19 ENCOUNTER — Encounter: Payer: Self-pay | Admitting: *Deleted

## 2015-07-20 NOTE — Telephone Encounter (Signed)
Left message on machine for pt to call back.

## 2015-07-20 NOTE — Telephone Encounter (Signed)
[phone], pt cb

## 2015-07-20 NOTE — Progress Notes (Signed)
Quick Note:  LVM for pt to return call ______ 

## 2015-07-20 NOTE — Telephone Encounter (Signed)
lmtcb x1 for pt. 

## 2015-07-20 NOTE — Telephone Encounter (Signed)
Pt returning call.[name] [name] ° °

## 2015-07-24 ENCOUNTER — Encounter: Payer: Self-pay | Admitting: *Deleted

## 2015-07-24 NOTE — Telephone Encounter (Signed)
ATC, NA and no option to leave a msg this time  I will mail him letter

## 2015-08-28 ENCOUNTER — Ambulatory Visit: Payer: Medicare Other | Admitting: Internal Medicine

## 2015-09-05 ENCOUNTER — Ambulatory Visit: Payer: Medicare Other | Admitting: Internal Medicine

## 2015-09-26 ENCOUNTER — Encounter: Payer: Self-pay | Admitting: Internal Medicine

## 2015-09-26 ENCOUNTER — Ambulatory Visit (INDEPENDENT_AMBULATORY_CARE_PROVIDER_SITE_OTHER)
Admission: RE | Admit: 2015-09-26 | Discharge: 2015-09-26 | Disposition: A | Discharge status: Home / Self Care | Payer: Medicare Other | Source: Ambulatory Visit | Attending: Internal Medicine | Admitting: Internal Medicine

## 2015-09-26 ENCOUNTER — Ambulatory Visit (INDEPENDENT_AMBULATORY_CARE_PROVIDER_SITE_OTHER): Payer: Medicare Other | Admitting: Internal Medicine

## 2015-09-26 VITALS — BP 122/68 | HR 72 | Ht 75.0 in | Wt 295.8 lb

## 2015-09-26 DIAGNOSIS — R918 Other nonspecific abnormal finding of lung field: Secondary | ICD-10-CM | POA: Diagnosis not present

## 2015-09-26 DIAGNOSIS — R0600 Dyspnea, unspecified: Secondary | ICD-10-CM | POA: Diagnosis not present

## 2015-09-26 DIAGNOSIS — J9611 Chronic respiratory failure with hypoxia: Secondary | ICD-10-CM | POA: Diagnosis not present

## 2015-09-26 DIAGNOSIS — J9612 Chronic respiratory failure with hypercapnia: Secondary | ICD-10-CM

## 2015-09-26 NOTE — Progress Notes (Signed)
Subjective:    Patient ID: [name] [name], male    DOB: 01/23/1941,   MRN: [id number]    Brief patient profile:  26 yowm never smoker with prev dx of OHS/ MPNS since at least 2012  last seen by Dr Gwenette Greet 2013 referred back to pulmonary clinic 07/12/2015 by Timmothy Sours NP for sob with new CT abd 07/10/15 suggesting slt growth RLL process since 2013 with dx of renal cell ca/ dementia per [facility]    History of Present Illness  07/12/2015 1 office visit/ Albertha Beattie  Pulm consultation  Chief Complaint  Patient presents with  . Pulmonary Consult    Referred by Timmothy Sours, NP. Pt c/o chronic SOB x 1 year, non-prod cough, right sided rib pain. Pt states recent PNA   extremely difficult hx/ Patient failed to answer a single question asked in a straightforward manner, tending to go off on tangents or answer questions with ambiguous medical terms or diagnoses and seemed aggravated  when asked the same question more than once for clarification.  Says sob x 50 ft (see walking study) gradually worse x one year despite symbicort 160 2bid/ no better with saba Cough is dry, cp is not pleuritic  - no bloody or purulent mucus.  rec Please remember to go to the  x-ray department downstairs for your tests - we will call you with the results when they are available. Please schedule a follow up visit in 3 months but call sooner if needed       09/26/2015  f/u ov/Kyston Gonce re: pulm infiltrates/ chronic resp failure with purely restrictive spirometry   Chief Complaint  Patient presents with  . Follow-up    pt following for pulmonary nodules: pt states hes having some problems breathing and chest tightness for quit a while. pt c/o dry cough and slight wheezing. pt using in hailers with little relief.  pt useing O2 at night at 3LPM. DME: [employer]   Slowed by L hip / L foot not sob, has 02 3lpm and only has concentrator so doesn't use any portable 02   No obvious day to day or daytime variability or assoc excess or  purulent sputum  or   subjective wheeze or overt sinus or hb symptoms. No unusual exp hx or h/o childhood pna/ asthma or knowledge of premature birth.  Sleeping ok 0n 3lpm without nocturnal  or early am exacerbation  of respiratory  c/o's or need for noct saba. Also denies any obvious fluctuation of symptoms with weather or environmental changes or other aggravating or alleviating factors except as outlined above   Current Medications, Allergies, Complete Past Medical History, Past Surgical History, Family History, and Social History were reviewed in [employer] record.  ROS  The following are not active complaints unless bolded sore throat, dysphagia, dental problems, itching, sneezing,  nasal congestion or excess/ purulent secretions, ear ache,   fever, chills, sweats, unintended wt loss, classically pleuritic or exertional cp, hemoptysis,  orthopnea pnd or leg swelling, presyncope, palpitations, abdominal pain, anorexia, nausea, vomiting, diarrhea  or change in bowel or bladder habits, change in stools or urine, dysuria,hematuria,  rash, arthralgias, visual complaints, headache, numbness, weakness or ataxia or problems with walking or coordination,  change in mood/affect or memory.                 Objective:   Physical Exam  amb wm with rolling walker nad    09/26/2015  299  07/12/15 291 lb (131.997 kg)  07/06/15 295 lb (133.811 kg)  02/23/15 300 lb (136.079 kg)    Vital signs reviewed   HEENT: nl dentition, turbinates, and orophanx. Nl external ear canals without cough reflex   NECK :  without JVD/Nodes/TM/ nl carotid upstrokes bilaterally   LUNGS: no acc muscle use, clear to A and P bilaterally without cough on insp or exp maneuvers   CV:  RRR  no s3 or murmur or increase in P2, no edema   ABD:  soft and nontender with nl excursion in the supine position. 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     Assessment & Plan:

## 2015-09-26 NOTE — Patient Instructions (Addendum)
Please remember to go to the x-ray department downstairs for your tests - we will call you with the results when they are available.    Please schedule a follow up office visit in 6 weeks, call sooner if needed  Late add : change to 3 m with cxr and full pfts

## 2015-09-27 ENCOUNTER — Telehealth: Payer: Self-pay | Admitting: *Deleted

## 2015-09-27 DIAGNOSIS — J309 Allergic rhinitis, unspecified: Secondary | ICD-10-CM

## 2015-09-27 DIAGNOSIS — J9612 Chronic respiratory failure with hypercapnia: Secondary | ICD-10-CM

## 2015-09-27 DIAGNOSIS — J9611 Chronic respiratory failure with hypoxia: Secondary | ICD-10-CM | POA: Insufficient documentation

## 2015-09-27 NOTE — Progress Notes (Signed)
Quick Note:  lmtcb ______ 

## 2015-09-27 NOTE — Assessment & Plan Note (Addendum)
-   07/12/2015  Walked RA x 2 laps @ 185 ft each stopped due to  Ankle pain, no sob or sat < 90% at nl pace Spirometry restrictive 09/26/2015 with HC03  07/10/15 31 c/w mild hypercarbia  He could have an element of AB but if so this is controlled with symbicort 160 2bid and mostly now limited by restrictive changes related to obesity/deconditioning so nothing else to offer   I had an extended discussion with the patient reviewing all relevant studies completed to date and  lasting 15 to 20 minutes of a 25 minute visit    Each maintenance medication was reviewed in detail including most importantly the difference between maintenance and prns and under what circumstances the prns are to be triggered using an action plan format that is not reflected in the computer generated alphabetically organized AVS.    Please see instructions for details which were reviewed in writing and the patient given a copy highlighting the part that I personally wrote and discussed at today's ov.

## 2015-09-27 NOTE — Assessment & Plan Note (Signed)
-   CT 06/2011 stable.  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

## 2015-09-27 NOTE — Telephone Encounter (Signed)
LMTCB- also need to give cxr results

## 2015-09-27 NOTE — Telephone Encounter (Signed)
-----   Message from Tanda Rockers, MD sent at 09/27/2015  9:40 AM EST ----- Change f/u to 3 m with full pfts

## 2015-09-27 NOTE — Assessment & Plan Note (Signed)
See chronic resp failure  

## 2015-09-27 NOTE — Assessment & Plan Note (Signed)
Complicated by ohs / restrictive physiology on pfts 09/26/2015 and hyperlipidemia   Body mass index is 36.97    Lab Results  Component Value Date   TSH 6.805* 01/17/2012     Contributing to gerd tendency/ doe/reviewed the need and the process to achieve and maintain neg calorie balance > defer f/u primary care including intermittently monitoring thyroid status

## 2015-09-28 NOTE — Telephone Encounter (Signed)
LMTCB

## 2015-09-28 NOTE — Progress Notes (Signed)
Quick Note:  LMTCB ______ 

## 2015-09-29 NOTE — Telephone Encounter (Signed)
LMTCB

## 2015-09-29 NOTE — Progress Notes (Signed)
Quick Note:  LMTCB ______ 

## 2015-10-02 ENCOUNTER — Telehealth: Payer: Self-pay | Admitting: Internal Medicine

## 2015-10-02 NOTE — Telephone Encounter (Signed)
Patient Returned call  [phone]

## 2015-10-02 NOTE — Telephone Encounter (Signed)
Use afrin 12 hours nasal spray 1-2 pffs each nostril x 2 m before flonase x 5 days then stop afrin - if not better by mid week call for referral to ent

## 2015-10-02 NOTE — Telephone Encounter (Signed)
lmtcb x1 for pt. 

## 2015-10-02 NOTE — Telephone Encounter (Signed)
Pt c/o sinus/allergy issues - nasal congestion and difficulty eating d/t congestion. No discolored mucus mentioned. States that once he gets the mucus out he can breathe and can eat normally. Pt states that his nose sounds like a fog horn when he blows it. Pt states that he has been dealing with this for 2-3 months and it is worsening. Using a nasal spray (cannot remember the name) but does not seem to be helping. Would like rec's.  Please advise Dr Melvyn Novas. Thanks.

## 2015-10-02 NOTE — Telephone Encounter (Signed)
Spoke with patient - states that we need to contact the Med Tech at [facility] with these rec's. Phone number [phone] Spoke with [name] [name] - made aware of rec's. Nothing further needed.

## 2015-10-02 NOTE — Telephone Encounter (Signed)
Pt aware of results of cxr.  Notes Recorded by Tanda Rockers, MD on 09/27/2015 at 9:24 AM Call pt: Reviewed cxr and no acute change so no change in recommendations made at ov ----- Appt reschedule with PFT per MW PFT 01/01/16 @ 1pm OV 01/01/16 @ 2pm

## 2015-10-02 NOTE — Telephone Encounter (Signed)
lmtcb x2 for pt. 

## 2015-10-02 NOTE — Telephone Encounter (Signed)
Patient Returned call ° ° °

## 2015-10-02 NOTE — Telephone Encounter (Signed)
Since we have been playing phone tag, message has been left in detail on pt's named voicemail.

## 2015-10-02 NOTE — Telephone Encounter (Signed)
ATC - did not leave a voicemail - patient advised front staff that voicemail is broken  [facility]  Tanda Rockers, MD at 10/02/2015 9:26 AM     Status: Signed       Expand All Collapse All   Use afrin 12 hours nasal spray 1-2 pffs each nostril x 2 m before flonase x 5 days then stop afrin - if not better by mid week call for referral to ent

## 2015-10-09 ENCOUNTER — Emergency Department (HOSPITAL_COMMUNITY)
Admission: EM | Admit: 2015-10-09 | Discharge: 2015-10-09 | Disposition: A | Discharge status: Home / Self Care | Payer: Medicare Other | Attending: Emergency Medicine | Admitting: Emergency Medicine

## 2015-10-09 ENCOUNTER — Encounter (HOSPITAL_COMMUNITY): Payer: Self-pay | Admitting: Emergency Medicine

## 2015-10-09 DIAGNOSIS — Z7982 Long term (current) use of aspirin: Secondary | ICD-10-CM | POA: Insufficient documentation

## 2015-10-09 DIAGNOSIS — G309 Alzheimer's disease, unspecified: Secondary | ICD-10-CM | POA: Insufficient documentation

## 2015-10-09 DIAGNOSIS — Z7902 Long term (current) use of antithrombotics/antiplatelets: Secondary | ICD-10-CM | POA: Insufficient documentation

## 2015-10-09 DIAGNOSIS — Z79899 Other long term (current) drug therapy: Secondary | ICD-10-CM | POA: Insufficient documentation

## 2015-10-09 DIAGNOSIS — L03115 Cellulitis of right lower limb: Secondary | ICD-10-CM | POA: Diagnosis not present

## 2015-10-09 DIAGNOSIS — Z7951 Long term (current) use of inhaled steroids: Secondary | ICD-10-CM | POA: Insufficient documentation

## 2015-10-09 DIAGNOSIS — E039 Hypothyroidism, unspecified: Secondary | ICD-10-CM | POA: Diagnosis not present

## 2015-10-09 DIAGNOSIS — M1990 Unspecified osteoarthritis, unspecified site: Secondary | ICD-10-CM | POA: Insufficient documentation

## 2015-10-09 DIAGNOSIS — Q859 Phakomatosis, unspecified: Secondary | ICD-10-CM | POA: Insufficient documentation

## 2015-10-09 DIAGNOSIS — J449 Chronic obstructive pulmonary disease, unspecified: Secondary | ICD-10-CM | POA: Diagnosis not present

## 2015-10-09 DIAGNOSIS — Z85528 Personal history of other malignant neoplasm of kidney: Secondary | ICD-10-CM | POA: Insufficient documentation

## 2015-10-09 DIAGNOSIS — I10 Essential (primary) hypertension: Secondary | ICD-10-CM | POA: Insufficient documentation

## 2015-10-09 DIAGNOSIS — E785 Hyperlipidemia, unspecified: Secondary | ICD-10-CM | POA: Diagnosis not present

## 2015-10-09 DIAGNOSIS — R21 Rash and other nonspecific skin eruption: Secondary | ICD-10-CM | POA: Diagnosis present

## 2015-10-09 DIAGNOSIS — K219 Gastro-esophageal reflux disease without esophagitis: Secondary | ICD-10-CM | POA: Insufficient documentation

## 2015-10-09 DIAGNOSIS — F0280 Dementia in other diseases classified elsewhere without behavioral disturbance: Secondary | ICD-10-CM | POA: Diagnosis not present

## 2015-10-09 DIAGNOSIS — I2510 Atherosclerotic heart disease of native coronary artery without angina pectoris: Secondary | ICD-10-CM | POA: Diagnosis not present

## 2015-10-09 LAB — CBC WITH DIFFERENTIAL/PLATELET
BASOS PCT: 0 %
Basophils Absolute: 0 10*3/uL (ref 0.0–0.1)
EOS ABS: 0.2 10*3/uL (ref 0.0–0.7)
EOS PCT: 2 %
HCT: 37.7 % — ABNORMAL LOW (ref 39.0–52.0)
HEMOGLOBIN: 12.4 g/dL — AB (ref 13.0–17.0)
LYMPHS ABS: 1.2 10*3/uL (ref 0.7–4.0)
Lymphocytes Relative: 16 %
MCH: 33.7 pg (ref 26.0–34.0)
MCHC: 32.9 g/dL (ref 30.0–36.0)
MCV: 102.4 fL — ABNORMAL HIGH (ref 78.0–100.0)
Monocytes Absolute: 0.7 10*3/uL (ref 0.1–1.0)
Monocytes Relative: 10 %
NEUTROS PCT: 72 %
Neutro Abs: 5.3 10*3/uL (ref 1.7–7.7)
PLATELETS: 155 10*3/uL (ref 150–400)
RBC: 3.68 MIL/uL — AB (ref 4.22–5.81)
RDW: 13.9 % (ref 11.5–15.5)
WBC: 7.4 10*3/uL (ref 4.0–10.5)

## 2015-10-09 LAB — BASIC METABOLIC PANEL
Anion gap: 9 (ref 5–15)
BUN: 26 mg/dL — AB (ref 6–20)
CALCIUM: 7.6 mg/dL — AB (ref 8.9–10.3)
CHLORIDE: 102 mmol/L (ref 101–111)
CO2: 29 mmol/L (ref 22–32)
CREATININE: 1.35 mg/dL — AB (ref 0.61–1.24)
GFR, EST AFRICAN AMERICAN: 57 mL/min — AB (ref >60)
GFR, EST NON AFRICAN AMERICAN: 49 mL/min — AB (ref >60)
Glucose, Bld: 101 mg/dL — ABNORMAL HIGH (ref 65–99)
Potassium: 4.1 mmol/L (ref 3.5–5.1)
SODIUM: 140 mmol/L (ref 135–145)

## 2015-10-09 MED ORDER — CEPHALEXIN 500 MG PO CAPS: 500.0000 mg | ORAL_CAPSULE | Freq: Four times a day (QID) | ORAL | Status: DC

## 2015-10-09 MED ORDER — CEFAZOLIN SODIUM-DEXTROSE 2-3 GM-% IV SOLR
2.0000 g | Freq: Once | INTRAVENOUS | Status: AC
  Administered 2015-10-09: 2 g via INTRAVENOUS
  Filled 2015-10-09: qty 50

## 2015-10-09 MED ORDER — FUROSEMIDE 20 MG PO TABS: 20.0000 mg | ORAL_TABLET | Freq: Every day | ORAL | Status: DC

## 2015-10-09 NOTE — ED Notes (Signed)
Per EMS pt from [facility] with complaint of recurrent cellulitis to bilateral LE; worse on right noted 2 days ago.

## 2015-10-09 NOTE — ED Notes (Signed)
[facility] called for transport.  

## 2015-10-09 NOTE — ED Notes (Signed)
Bed: WA07 Expected date:  Expected time:  Means of arrival:  Comments: 

## 2015-10-09 NOTE — Discharge Instructions (Signed)

## 2015-10-09 NOTE — ED Provider Notes (Signed)
CSN: [id number]     Arrival date & time 10/09/15  1015 History   First MD Initiated Contact with Patient 10/09/15 1022     Chief Complaint  Patient presents with  . Cellulitis      HPI  Vision presents for evaluation of a red spot on his right lower leg. History of previous episodes of dependent edema and fluctuance cellulitis. Does not feel poorly. No fevers chills. No prostration. No significant erythema on his right lower leg yesterday worse today presents here. Not currently on diuretics, no recent antibiotic use.  Past Medical History  Diagnosis Date  . Other congenital hamartoses, not elsewhere classified   . Family history of ischemic heart disease   . Family history of malignant neoplasm of gastrointestinal tract   . Allergic rhinitis, cause unspecified   . Other and unspecified hyperlipidemia   . Unspecified essential hypertension   . Obesity hypoventilation syndrome ([place])     failed cpap/bipap trial  . Hypothyroidism   . GERD (gastroesophageal reflux disease)   . Arthritis     osteoarthritis  . DEMENTIA     Alzheimer's, senile dementia  . Shortness of breath   . Cancer ([place])     renal carcinoma  . Sleep apnea     OXYGEN 24 HR/DAY-NO CPAP  . COPD (chronic obstructive pulmonary disease) ([facility])     [name] FOLLOWS PT--PT USING OXYGEN 24 HRS A DAY  . COPD (chronic obstructive pulmonary disease) (HCC)     PULMONARY CLEARANCE FOR LEFT RENAL CRYOABLATION ON CHART FROM DR. CLANCE -07/26/11  . Coronary artery disease     CARDIAC CLEARANCE  OFFICE NOTE FOR LEFT CRYO ABLATION ON CHART FROM [name] -"RECENT STRESS TEST DID NOT SHOW ISCHEMIA" -"PRESUMED CAD DUE TO CORONARY CALCIFICATIONS"    Past Surgical History  Procedure Laterality Date  . Eye surgery      bilateral cataracts  . Joint replacement      bilateral knee replacements  . Carpal tunnel release      right  . Thyroidectomy    . Cholecystectomy  2009  . Knee arthroplasty      2005 LEFT   AND  2007 RIGHT--[name]  . Tumor removal  09/14/2011   Family History  Problem Relation Age of Onset  . Cancer Other     colon  . Heart disease Other     CAD, MI and Heart Attack   Social History  Substance Use Topics  . Smoking status: Never Smoker   . Smokeless tobacco: Never Used  . Alcohol Use: No    Review of Systems  Constitutional: Negative for fever, chills, diaphoresis, appetite change and fatigue.  HENT: Negative for mouth sores, sore throat and trouble swallowing.   Eyes: Negative for visual disturbance.  Respiratory: Negative for cough, chest tightness, shortness of breath and wheezing.   Cardiovascular: Negative for chest pain.  Gastrointestinal: Negative for nausea, vomiting, abdominal pain, diarrhea and abdominal distention.  Endocrine: Negative for polydipsia, polyphagia and polyuria.  Genitourinary: Negative for dysuria, frequency and hematuria.  Musculoskeletal: Negative for gait problem.  Skin: Positive for rash. Negative for color change and pallor.  Neurological: Negative for dizziness, syncope, light-headedness and headaches.  Hematological: Does not bruise/bleed easily.  Psychiatric/Behavioral: Negative for behavioral problems and confusion.      Allergies  Review of patient's allergies indicates no known allergies.  Home Medications   Prior to Admission medications   Medication Sig Start Date End Date Taking? Authorizing Provider  acetaminophen (TYLENOL) 500 MG tablet Take 500 mg by mouth 3 (three) times daily. Scheduled    Historical Provider, MD  albuterol (PROVENTIL) (2.5 MG/3ML) 0.083% nebulizer solution Take 2.5 mg by nebulization every 6 (six) hours as needed for wheezing or shortness of breath.    Historical Provider, MD  amoxicillin (AMOXIL) 500 MG capsule Take 1 capsule (500 mg total) by mouth 3 (three) times daily. Patient not taking: Reported on 10/09/2015 07/06/15   Harvel Quale, MD  aspirin 81 MG chewable tablet Chew 81 mg by mouth  daily.    Historical Provider, MD  atorvastatin (LIPITOR) 20 MG tablet Take 20 mg by mouth at bedtime.     Historical Provider, MD  bisacodyl (DULCOLAX) 10 MG suppository Place 10 mg rectally daily as needed for moderate constipation.    Historical Provider, MD  budesonide-formoterol (SYMBICORT) 160-4.5 MCG/ACT inhaler Inhale 2 puffs into the lungs 2 (two) times daily.    Historical Provider, MD  calcitRIOL (ROCALTROL) 0.5 MCG capsule Take 0.5 mcg by mouth daily.    Historical Provider, MD  calcium carbonate (TUMS) 500 MG chewable tablet Chew 2 tablets by mouth daily with breakfast.    Historical Provider, MD  cephALEXin (KEFLEX) 500 MG capsule Take 1 capsule (500 mg total) by mouth 4 (four) times daily. 10/09/15   Tanna Furry, MD  clopidogrel (PLAVIX) 75 MG tablet Take 75 mg by mouth every morning.     Historical Provider, MD  cyanocobalamin 500 MCG tablet Take 500 mcg by mouth daily.    Historical Provider, MD  cycloSPORINE (RESTASIS) 0.05 % ophthalmic emulsion Place 1 drop into both eyes 2 (two) times daily.     Historical Provider, MD  diclofenac sodium (VOLTAREN) 1 % GEL Apply 2 g topically 2 (two) times daily.     Historical Provider, MD  docusate sodium (COLACE) 100 MG capsule Take 100 mg by mouth 2 (two) times daily as needed for mild constipation or moderate constipation.    Historical Provider, MD  doxycycline (VIBRAMYCIN) 100 MG capsule Take 1 capsule (100 mg total) by mouth 2 (two) times daily. Patient not taking: Reported on 10/09/2015 07/06/15   Harvel Quale, MD  ferrous sulfate 325 (65 FE) MG tablet Take 325 mg by mouth daily with breakfast.    Historical Provider, MD  fexofenadine (ALLEGRA) 180 MG tablet Take 180 mg by mouth daily.    Historical Provider, MD  finasteride (PROSCAR) 5 MG tablet Take 5 mg by mouth daily.    Historical Provider, MD  fluticasone (FLONASE) 50 MCG/ACT nasal spray Place 1 spray into the nose 2 (two) times daily.     Historical Provider, MD  furosemide  (LASIX) 20 MG tablet Take 1 tablet (20 mg total) by mouth daily. 10/09/15   Tanna Furry, MD  HYDROcodone-acetaminophen (NORCO/VICODIN) 5-325 MG per tablet Take 0.5 tablets by mouth 2 (two) times daily as needed for moderate pain.    Historical Provider, MD  hydrocortisone cream 1 % Apply to affected area 2 times daily 05/05/15   Hyman Bible, PA-C  hydroxypropyl methylcellulose / hypromellose (ISOPTO TEARS / GONIOVISC) 2.5 % ophthalmic solution Place 1 drop into both eyes 2 (two) times daily.    Historical Provider, MD  ipratropium (ATROVENT) 0.02 % nebulizer solution Take 500 mcg by nebulization every 4 (four) hours as needed for wheezing or shortness of breath.     Historical Provider, MD  levothyroxine (SYNTHROID, LEVOTHROID) 200 MCG tablet Take 200 mcg by mouth daily.  Historical Provider, MD  Melatonin 5 MG TABS Take 1 tablet by mouth at bedtime.    Historical Provider, MD  metoprolol tartrate (LOPRESSOR) 25 MG tablet Take 12.5 mg by mouth 2 (two) times daily. Hold for systolic blood pressure 99991111    Historical Provider, MD  metroNIDAZOLE (FLAGYL) 500 MG tablet Take 1 tablet (500 mg total) by mouth 3 (three) times daily. Patient not taking: Reported on 10/09/2015 07/11/15   Katheren Shams, DO  neomycin-bacitracin-polymyxin (NEOSPORIN) [phone] ointment Apply 1 application topically 2 (two) times daily as needed (for irritation).    Historical Provider, MD  nystatin (MYCOSTATIN/NYSTOP) 100000 UNIT/GM POWD Apply 1 g topically 2 (two) times daily as needed (groin irrtation).     Historical Provider, MD  OXYGEN Inhale into the lungs at bedtime.    Historical Provider, MD  pantoprazole (PROTONIX) 40 MG tablet Take 40 mg by mouth daily.     Historical Provider, MD  polyethylene glycol powder (GLYCOLAX/MIRALAX) powder Take 1 Container by mouth every other day. Mix 1 capful (17g) in 8 ounces of fluid and drink    Historical Provider, MD  polyvinyl alcohol (LIQUIFILM TEARS) 1.4 % ophthalmic solution  Place 1 drop into both eyes 2 (two) times daily.    Historical Provider, MD  pregabalin (LYRICA) 75 MG capsule Take 75 mg by mouth 2 (two) times daily.    Historical Provider, MD  senna-docusate (SENOKOT-S) 8.6-50 MG per tablet Take 3 tablets by mouth 2 (two) times daily.     Historical Provider, MD  sodium chloride (OCEAN) 0.65 % SOLN nasal spray Place 1 spray into both nostrils every 2 (two) hours as needed for congestion.    Historical Provider, MD  Tamsulosin HCl (FLOMAX) 0.4 MG CAPS Take 0.4 mg by mouth daily. Hold if SBP < 100    Historical Provider, MD  traZODone (DESYREL) 150 MG tablet Take 300 mg by mouth at bedtime.    Historical Provider, MD  venlafaxine (EFFEXOR) 75 MG tablet Take 75 mg by mouth daily.    Historical Provider, MD   BP 98/66 mmHg  Pulse 63  Temp(Src) 98.1 F (36.7 C) (Oral)  Resp 18  Ht 6\' 3"  (1.905 m)  Wt 290 lb (131.543 kg)  BMI 36.25 kg/m2  SpO2 96% Physical Exam  Constitutional: He is oriented to person, place, and time. He appears well-developed and well-nourished. No distress.  HENT:  Head: Normocephalic.  Eyes: Conjunctivae are normal. Pupils are equal, round, and reactive to light. No scleral icterus.  Neck: Normal range of motion. Neck supple. No thyromegaly present.  Cardiovascular: Normal rate and regular rhythm.  Exam reveals no gallop and no friction rub.   No murmur heard. Pulmonary/Chest: Effort normal and breath sounds normal. No respiratory distress. He has no wheezes. He has no rales.  Abdominal: Soft. Bowel sounds are normal. He exhibits no distension. There is no tenderness. There is no rebound.  Musculoskeletal: Normal range of motion.  Neurological: He is alert and oriented to person, place, and time.  Skin: Skin is warm and dry. No rash noted.     Psychiatric: He has a normal mood and affect. His behavior is normal.    ED Course  Procedures (including critical care time) Labs Review Labs Reviewed  BASIC METABOLIC PANEL -  Abnormal; Notable for the following:    Glucose, Bld 101 (*)    BUN 26 (*)    Creatinine, Ser 1.35 (*)    Calcium 7.6 (*)    GFR calc non  Af Amer 49 (*)    GFR calc Af Amer 57 (*)    All other components within normal limits  CBC WITH DIFFERENTIAL/PLATELET - Abnormal; Notable for the following:    RBC 3.68 (*)    Hemoglobin 12.4 (*)    HCT 37.7 (*)    MCV 102.4 (*)    All other components within normal limits    Imaging Review No results found. I have personally reviewed and evaluated these images and lab results as part of my medical decision-making.   EKG Interpretation None      MDM   Final diagnoses:  Cellulitis of right lower extremity    Given IV antibiotics. Renal function at his baseline. No marked leukocytosis. Plan will be home, elevation, 4 days ago daily Lasix, Keflex, reevaluation not improving.    Tanna Furry, MD 10/09/15 1257

## 2015-10-11 ENCOUNTER — Ambulatory Visit: Payer: Medicare Other | Admitting: Internal Medicine

## 2015-11-07 ENCOUNTER — Other Ambulatory Visit: Payer: Self-pay | Admitting: Orthopedic Surgery

## 2015-11-10 ENCOUNTER — Ambulatory Visit: Payer: Medicare Other | Admitting: Internal Medicine

## 2015-11-17 NOTE — Pre-Procedure Instructions (Signed)
    Jantz Shaddy Racette  11/17/2015     No Pharmacies Listed   Your procedure is scheduled on Friday, December 01, 2015  Report to [facility] Admitting at 7:45 A.M.  Call this number if you have problems the morning of surgery:  [phone]   Remember:  Do not eat food or drink liquids after midnight Thursday, November 30, 2015  Take these medicines the morning of surgery with A SIP OF WATER : fexofenadine (ALLEGRA),  levothyroxine (SYNTHROID, LEVOTHROID),  metoprolol tartrate (LOPRESSOR)- hold for systolic blood pressure 99991111,  pregabalin (LYRICA),  Tamsulosin HCl (FLOMAX)-hold if SBP < 100, venlafaxine (EFFEXOR), budesonide-formoterol (SYMBICORT) inhaler, cycloSPORINE (RESTASIS) eye drops, (ISOPTO TEARS / GONIOVISC) eye drops, polyvinyl alcohol (LIQUIFILM TEARS) eye drops, fluticasone (FLONASE) nasal spray, if needed: Hydrocodone (NORCO/VICODIN) for pain, famotidine (PEPCID) for heartburn or indigestion, sodium chloride (OCEAN)  nasal spray for congestion, albuterol (PROVENTIL) nebulizer solut for wheezing or shortness of breath, ipratropium (ATROVENT)  nebulizer solution  for wheezing or shortness of breath  Stop taking Aspirin, Plavix, vitamins, fish oil and herbal medications. Do not take any NSAIDs ie: Ibuprofen, Advil, Naproxen or any medication containing Aspirin such as diclofenac sodium (VOLTAREN); stop February  (NICOLE FROM DR GRAVES OFFICE TO CALL AND INSTRUCT ABOUT ASPIRIN AND PLAVIX)  Do not wear jewelry, make-up or nail polish.  Do not wear lotions, powders, or perfumes.  You may not wear deodorant.  Do not shave 48 hours prior to surgery.  Men may shave face and neck.  Do not bring valuables to the hospital.  [facility] is not responsible for any belongings or valuables.  Contacts, dentures or bridgework may not be worn into surgery.  Leave your suitcase in the car.  After surgery it may be brought to your room.  For patients admitted to the hospital, discharge time  will be determined by your treatment team.  Patients discharged the day of surgery will not be allowed to drive home.   Name and phone number of your driver:  Special instructions: Shower the night before surgery and the morning of surgery with CHG.  Please read over the following fact sheets that you were given. Pain Booklet, Coughing and Deep Breathing, Blood Transfusion Information, Total Joint Packet, MRSA Information and Surgical Site Infection Prevention

## 2015-11-20 ENCOUNTER — Encounter (HOSPITAL_COMMUNITY)
Admission: RE | Admit: 2015-11-20 | Discharge: 2015-11-20 | Disposition: A | Discharge status: Home / Self Care | Payer: Medicare Other | Source: Ambulatory Visit | Attending: Orthopedic Surgery | Admitting: Orthopedic Surgery

## 2015-11-20 ENCOUNTER — Encounter (HOSPITAL_COMMUNITY): Payer: Self-pay

## 2015-11-20 DIAGNOSIS — I252 Old myocardial infarction: Secondary | ICD-10-CM | POA: Insufficient documentation

## 2015-11-20 DIAGNOSIS — J449 Chronic obstructive pulmonary disease, unspecified: Secondary | ICD-10-CM | POA: Insufficient documentation

## 2015-11-20 DIAGNOSIS — M1612 Unilateral primary osteoarthritis, left hip: Secondary | ICD-10-CM | POA: Diagnosis not present

## 2015-11-20 DIAGNOSIS — I2510 Atherosclerotic heart disease of native coronary artery without angina pectoris: Secondary | ICD-10-CM | POA: Diagnosis not present

## 2015-11-20 DIAGNOSIS — Z01818 Encounter for other preprocedural examination: Secondary | ICD-10-CM | POA: Insufficient documentation

## 2015-11-20 DIAGNOSIS — Z7902 Long term (current) use of antithrombotics/antiplatelets: Secondary | ICD-10-CM | POA: Insufficient documentation

## 2015-11-20 DIAGNOSIS — K219 Gastro-esophageal reflux disease without esophagitis: Secondary | ICD-10-CM | POA: Insufficient documentation

## 2015-11-20 DIAGNOSIS — E039 Hypothyroidism, unspecified: Secondary | ICD-10-CM | POA: Diagnosis not present

## 2015-11-20 DIAGNOSIS — Z7982 Long term (current) use of aspirin: Secondary | ICD-10-CM | POA: Insufficient documentation

## 2015-11-20 DIAGNOSIS — G4733 Obstructive sleep apnea (adult) (pediatric): Secondary | ICD-10-CM | POA: Insufficient documentation

## 2015-11-20 DIAGNOSIS — Z85528 Personal history of other malignant neoplasm of kidney: Secondary | ICD-10-CM | POA: Diagnosis not present

## 2015-11-20 DIAGNOSIS — I10 Essential (primary) hypertension: Secondary | ICD-10-CM | POA: Insufficient documentation

## 2015-11-20 DIAGNOSIS — Z8673 Personal history of transient ischemic attack (TIA), and cerebral infarction without residual deficits: Secondary | ICD-10-CM | POA: Diagnosis not present

## 2015-11-20 DIAGNOSIS — Z0183 Encounter for blood typing: Secondary | ICD-10-CM | POA: Insufficient documentation

## 2015-11-20 DIAGNOSIS — Z01812 Encounter for preprocedural laboratory examination: Secondary | ICD-10-CM | POA: Insufficient documentation

## 2015-11-20 DIAGNOSIS — F0390 Unspecified dementia without behavioral disturbance: Secondary | ICD-10-CM | POA: Diagnosis not present

## 2015-11-20 DIAGNOSIS — E785 Hyperlipidemia, unspecified: Secondary | ICD-10-CM | POA: Insufficient documentation

## 2015-11-20 DIAGNOSIS — Z79899 Other long term (current) drug therapy: Secondary | ICD-10-CM | POA: Insufficient documentation

## 2015-11-20 HISTORY — DX: Pneumonia, unspecified organism: J18.9

## 2015-11-20 HISTORY — DX: Acute myocardial infarction, unspecified: I21.9

## 2015-11-20 LAB — URINALYSIS, ROUTINE W REFLEX MICROSCOPIC
Glucose, UA: NEGATIVE mg/dL
Hgb urine dipstick: NEGATIVE
KETONES UR: 15 mg/dL — AB
NITRITE: NEGATIVE
PROTEIN: NEGATIVE mg/dL
Specific Gravity, Urine: 1.027 (ref 1.005–1.030)
pH: 6 (ref 5.0–8.0)

## 2015-11-20 LAB — CBC WITH DIFFERENTIAL/PLATELET
BASOS ABS: 0 10*3/uL (ref 0.0–0.1)
Basophils Relative: 0 %
Eosinophils Absolute: 0.2 10*3/uL (ref 0.0–0.7)
Eosinophils Relative: 2 %
HEMATOCRIT: 40 % (ref 39.0–52.0)
Hemoglobin: 12.9 g/dL — ABNORMAL LOW (ref 13.0–17.0)
LYMPHS PCT: 14 %
Lymphs Abs: 1.2 10*3/uL (ref 0.7–4.0)
MCH: 32.9 pg (ref 26.0–34.0)
MCHC: 32.3 g/dL (ref 30.0–36.0)
MCV: 102 fL — AB (ref 78.0–100.0)
MONO ABS: 0.7 10*3/uL (ref 0.1–1.0)
Monocytes Relative: 9 %
NEUTROS ABS: 6 10*3/uL (ref 1.7–7.7)
Neutrophils Relative %: 75 %
Platelets: 152 10*3/uL (ref 150–400)
RBC: 3.92 MIL/uL — AB (ref 4.22–5.81)
RDW: 13.9 % (ref 11.5–15.5)
WBC: 8 10*3/uL (ref 4.0–10.5)

## 2015-11-20 LAB — COMPREHENSIVE METABOLIC PANEL
ALK PHOS: 67 U/L (ref 38–126)
ALT: 12 U/L — AB (ref 17–63)
AST: 22 U/L (ref 15–41)
Albumin: 3.3 g/dL — ABNORMAL LOW (ref 3.5–5.0)
Anion gap: 11 (ref 5–15)
BUN: 18 mg/dL (ref 6–20)
CALCIUM: 7.5 mg/dL — AB (ref 8.9–10.3)
CHLORIDE: 103 mmol/L (ref 101–111)
CO2: 30 mmol/L (ref 22–32)
CREATININE: 1.36 mg/dL — AB (ref 0.61–1.24)
GFR calc non Af Amer: 49 mL/min — ABNORMAL LOW (ref >60)
GFR, EST AFRICAN AMERICAN: 57 mL/min — AB (ref >60)
GLUCOSE: 103 mg/dL — AB (ref 65–99)
Potassium: 4.4 mmol/L (ref 3.5–5.1)
SODIUM: 144 mmol/L (ref 135–145)
Total Bilirubin: 0.6 mg/dL (ref 0.3–1.2)
Total Protein: 6 g/dL — ABNORMAL LOW (ref 6.5–8.1)

## 2015-11-20 LAB — URINE MICROSCOPIC-ADD ON

## 2015-11-20 LAB — PROTIME-INR
INR: 0.97 (ref 0.00–1.49)
Prothrombin Time: 13.1 seconds (ref 11.6–15.2)

## 2015-11-20 LAB — SURGICAL PCR SCREEN
MRSA, PCR: POSITIVE — AB
STAPHYLOCOCCUS AUREUS: POSITIVE — AB

## 2015-11-20 LAB — TYPE AND SCREEN
ABO/RH(D): O POS
Antibody Screen: NEGATIVE

## 2015-11-20 LAB — ABO/RH: ABO/RH(D): O POS

## 2015-11-20 LAB — APTT: APTT: 29 s (ref 24–37)

## 2015-11-20 NOTE — Progress Notes (Signed)
Patient was able to verbalize surgery to be done and sign his own consent form.  Spoke with Elmyra Ricks at Dr. Berenice Primas office regarding when and if plavix and aspirin needed stopped.  Elmyra Ricks stated she would call patient's physician to see if okay to stop aspirin and plavix and then she will notify nurse at nursing home.  Spoke to [place] (Scientist, physiological) at nursing facility, informed her that patient was given instructions for showers, meds to take DOS etc, and that Elmyra Ricks at Dr. Berenice Primas office would be calling with instructions whether to stop aspirin and plavix.  Asked Wilburn Cornelia to please call Dr. Berenice Primas office if they have not heard anything.

## 2015-11-22 ENCOUNTER — Encounter (HOSPITAL_COMMUNITY): Payer: Self-pay | Admitting: Emergency Medicine

## 2015-11-22 NOTE — Progress Notes (Addendum)
Anesthesia Chart Review:  Pt is a 75 year old male scheduled for L total hip arthroplasty posterior approach on 12/01/2015 with Dr. Berenice Primas.   Pulmonologist is Dr. Christinia Gully  PMH includes:  CAD (coronary calcifications, no true dx CAD), MI, HTN, stroke (2015 - care everywhere), hyperlipidemia, hypothyroidism, COPD, 3L O2 at night, OSA (no CPAP), dementia, renal cancer, GERD. Never smoker. BMI 36.   Medications include: albuterol, ASA, lipitor, symbicort, plavix, iron, atrovent, levothyroxine, metoprolol. Pt to stop ASA and plavix 2 days prior to surgery. Pt is posted for spinal anesthesia  Preoperative labs reviewed.  Ca 7.5, consistent with prior results.   Chest x-ray 09/26/15 reviewed. Chronically low lung volumes with bibasilar scarring or atelectasis.  EKG 02/23/15: Sinus rhythm. Borderline left axis deviation. Low voltage, precordial leads. Slow R progression Noted  Echo 02/18/14 (care everywhere):  - The right ventricular ejection fraction is normal. - The aortic valve is trileaflet. - Grade I mild diastolic dysfunction; abnormal relaxation pattern. - Pulmonary hypertension is not suggested by Doppler findings. - The left ventricle is normal in size. The left ventricular apex is not well visualized. There is normal left ventricular wall thickness. The left ventricular ejection fraction is normal (55-60%). The left ventricular wall motion is normal. - Injection of contrast documented no interatrial shunt .  Carotid duplex 02/17/14 (care everywhere): No hemodynamically significant stenosis on either side  Nuclear stress test 01/03/11:  - Normal myocardial perfusion scan demonstrating an attenuation artifact in the inferior region of the myocardium. No ischemia or infarct/scar is seen in the remaining myocardium. This is a low risk scan.   Used to see Dr. Irish Lack when he was at [facility], last office visit 07/15/12.  Reviewed case with Dr. Ermalene Postin. Pt needs spinal anesthesia due to  pulmonary issues, and must be off plavix for 7 days prior to getting a spinal. Left message for [facility] in Dr. Berenice Primas office.   Willeen Cass, FNP-BC [facility]/Anesthesiology Phone: [phone] 11/22/2015 12:28 PM   Addendum: Damaris Schooner with [facility]. She has reached out urgently to PCP through [employer] Lesia Hausen, [place]) to clarify that patient may be off Plavix for seven days. She will then contact the patient and fax any additional input that is received.   [facility] Short Stay Center/Anesthesiology Phone [phone] 11/23/2015 10:21 AM

## 2015-12-01 ENCOUNTER — Inpatient Hospital Stay (HOSPITAL_COMMUNITY): Admission: RE | Admit: 2015-12-01 | Payer: Medicare Other | Source: Ambulatory Visit | Admitting: Orthopedic Surgery

## 2015-12-01 SURGERY — ARTHROPLASTY, HIP, TOTAL,POSTERIOR APPROACH
Anesthesia: Spinal | Laterality: Left

## 2016-01-01 ENCOUNTER — Ambulatory Visit: Payer: Medicare Other | Admitting: Internal Medicine

## 2016-02-15 ENCOUNTER — Emergency Department (HOSPITAL_COMMUNITY)
Admission: EM | Admit: 2016-02-15 | Discharge: 2016-02-15 | Disposition: A | Discharge status: Home / Self Care | Payer: Medicare Other | Attending: Emergency Medicine | Admitting: Emergency Medicine

## 2016-02-15 ENCOUNTER — Emergency Department (HOSPITAL_COMMUNITY): Payer: Medicare Other

## 2016-02-15 ENCOUNTER — Other Ambulatory Visit: Payer: Self-pay

## 2016-02-15 DIAGNOSIS — K219 Gastro-esophageal reflux disease without esophagitis: Secondary | ICD-10-CM | POA: Diagnosis not present

## 2016-02-15 DIAGNOSIS — Z85528 Personal history of other malignant neoplasm of kidney: Secondary | ICD-10-CM | POA: Diagnosis not present

## 2016-02-15 DIAGNOSIS — I2510 Atherosclerotic heart disease of native coronary artery without angina pectoris: Secondary | ICD-10-CM | POA: Insufficient documentation

## 2016-02-15 DIAGNOSIS — R0602 Shortness of breath: Secondary | ICD-10-CM | POA: Diagnosis present

## 2016-02-15 DIAGNOSIS — Z79899 Other long term (current) drug therapy: Secondary | ICD-10-CM | POA: Diagnosis not present

## 2016-02-15 DIAGNOSIS — E039 Hypothyroidism, unspecified: Secondary | ICD-10-CM | POA: Insufficient documentation

## 2016-02-15 DIAGNOSIS — J189 Pneumonia, unspecified organism: Secondary | ICD-10-CM | POA: Insufficient documentation

## 2016-02-15 DIAGNOSIS — R300 Dysuria: Secondary | ICD-10-CM | POA: Insufficient documentation

## 2016-02-15 DIAGNOSIS — E662 Morbid (severe) obesity with alveolar hypoventilation: Secondary | ICD-10-CM | POA: Insufficient documentation

## 2016-02-15 DIAGNOSIS — L304 Erythema intertrigo: Secondary | ICD-10-CM | POA: Insufficient documentation

## 2016-02-15 DIAGNOSIS — R109 Unspecified abdominal pain: Secondary | ICD-10-CM | POA: Diagnosis not present

## 2016-02-15 DIAGNOSIS — I252 Old myocardial infarction: Secondary | ICD-10-CM | POA: Insufficient documentation

## 2016-02-15 DIAGNOSIS — Z7952 Long term (current) use of systemic steroids: Secondary | ICD-10-CM | POA: Insufficient documentation

## 2016-02-15 DIAGNOSIS — J441 Chronic obstructive pulmonary disease with (acute) exacerbation: Secondary | ICD-10-CM | POA: Diagnosis not present

## 2016-02-15 DIAGNOSIS — G309 Alzheimer's disease, unspecified: Secondary | ICD-10-CM | POA: Diagnosis not present

## 2016-02-15 DIAGNOSIS — M1990 Unspecified osteoarthritis, unspecified site: Secondary | ICD-10-CM | POA: Diagnosis not present

## 2016-02-15 DIAGNOSIS — Z7951 Long term (current) use of inhaled steroids: Secondary | ICD-10-CM | POA: Insufficient documentation

## 2016-02-15 DIAGNOSIS — F0280 Dementia in other diseases classified elsewhere without behavioral disturbance: Secondary | ICD-10-CM | POA: Insufficient documentation

## 2016-02-15 DIAGNOSIS — Z7902 Long term (current) use of antithrombotics/antiplatelets: Secondary | ICD-10-CM | POA: Insufficient documentation

## 2016-02-15 LAB — URINALYSIS, ROUTINE W REFLEX MICROSCOPIC
Bilirubin Urine: NEGATIVE
Glucose, UA: NEGATIVE mg/dL
Hgb urine dipstick: NEGATIVE
Ketones, ur: 40 mg/dL — AB
Nitrite: NEGATIVE
Protein, ur: NEGATIVE mg/dL
Specific Gravity, Urine: 1.024 (ref 1.005–1.030)
pH: 7 (ref 5.0–8.0)

## 2016-02-15 LAB — COMPREHENSIVE METABOLIC PANEL
ALT: 17 U/L (ref 17–63)
ANION GAP: 12 (ref 5–15)
AST: 27 U/L (ref 15–41)
Albumin: 3.5 g/dL (ref 3.5–5.0)
Alkaline Phosphatase: 69 U/L (ref 38–126)
BUN: 27 mg/dL — ABNORMAL HIGH (ref 6–20)
CHLORIDE: 105 mmol/L (ref 101–111)
CO2: 27 mmol/L (ref 22–32)
CREATININE: 1.31 mg/dL — AB (ref 0.61–1.24)
Calcium: 7.6 mg/dL — ABNORMAL LOW (ref 8.9–10.3)
GFR, EST AFRICAN AMERICAN: 59 mL/min — AB (ref >60)
GFR, EST NON AFRICAN AMERICAN: 51 mL/min — AB (ref >60)
Glucose, Bld: 93 mg/dL (ref 65–99)
Potassium: 4.1 mmol/L (ref 3.5–5.1)
SODIUM: 144 mmol/L (ref 135–145)
Total Bilirubin: 0.8 mg/dL (ref 0.3–1.2)
Total Protein: 6.5 g/dL (ref 6.5–8.1)

## 2016-02-15 LAB — CBC
HCT: 37.8 % — ABNORMAL LOW (ref 39.0–52.0)
Hemoglobin: 12.3 g/dL — ABNORMAL LOW (ref 13.0–17.0)
MCH: 32.5 pg (ref 26.0–34.0)
MCHC: 32.5 g/dL (ref 30.0–36.0)
MCV: 100 fL (ref 78.0–100.0)
PLATELETS: 182 10*3/uL (ref 150–400)
RBC: 3.78 MIL/uL — AB (ref 4.22–5.81)
RDW: 14.3 % (ref 11.5–15.5)
WBC: 9 10*3/uL (ref 4.0–10.5)

## 2016-02-15 LAB — URINE MICROSCOPIC-ADD ON

## 2016-02-15 LAB — TROPONIN I

## 2016-02-15 LAB — MAGNESIUM: Magnesium: 1.8 mg/dL (ref 1.7–2.4)

## 2016-02-15 LAB — BRAIN NATRIURETIC PEPTIDE: B Natriuretic Peptide: 35.3 pg/mL (ref 0.0–100.0)

## 2016-02-15 MED ORDER — NYSTATIN 100000 UNIT/GM EX CREA: TOPICAL_CREAM | Freq: Two times a day (BID) | CUTANEOUS | Status: DC

## 2016-02-15 MED ORDER — IOPAMIDOL (ISOVUE-300) INJECTION 61%
100.0000 mL | Freq: Once | INTRAVENOUS | Status: AC | PRN
  Administered 2016-02-15: 100 mL via INTRAVENOUS

## 2016-02-15 MED ORDER — NYSTATIN 100000 UNIT/GM EX CREA
TOPICAL_CREAM | Freq: Two times a day (BID) | CUTANEOUS | Status: AC
  Administered 2016-02-15: 20:00:00 via TOPICAL
  Filled 2016-02-15: qty 15

## 2016-02-15 MED ORDER — LEVOFLOXACIN 750 MG PO TABS
750.0000 mg | ORAL_TABLET | Freq: Once | ORAL | Status: AC
  Administered 2016-02-15: 750 mg via ORAL
  Filled 2016-02-15: qty 1

## 2016-02-15 MED ORDER — LEVOFLOXACIN 500 MG PO TABS: 500.0000 mg | ORAL_TABLET | Freq: Every day | ORAL | Status: DC

## 2016-02-15 NOTE — ED Notes (Signed)
[place] notified of need for transport of pt to [facility].

## 2016-02-15 NOTE — ED Notes (Signed)
Bed: [id number] Expected date:  Expected time:  Means of arrival:  Comments: EMS-testicle pain/COPD

## 2016-02-15 NOTE — Discharge Instructions (Signed)

## 2016-02-15 NOTE — ED Notes (Signed)
Report called to [facility] given to [facility]. No questions during report of care.

## 2016-02-15 NOTE — ED Notes (Signed)
Patient transported to CT 

## 2016-02-15 NOTE — ED Notes (Signed)
Pt from SNF c/o ABD pain, testicular pain that has gradually increased over the last 2 weeks. VS per EMS VSS 112/78 76 97% CBG 108.

## 2016-02-15 NOTE — ED Notes (Signed)
PTAR here for transport back to [place].  

## 2016-02-15 NOTE — ED Provider Notes (Signed)
CSN: [id number]     Arrival date & time 02/15/16  1819 History   First MD Initiated Contact with Patient 02/15/16 1853     Chief Complaint  Patient presents with  . Abdominal Pain  . Testicle Pain  PT HAS MULTIPLE COMPLAINTS TODAY.  HE SAID THAT HE WAS EATING DINNER AND FELT SOB, ABD PAIN, AND VERY WEAK.  HE ALSO C/O A RASH THAT HE HAS TO HIS GROIN.  HE HAS CELLULITIS TO BOTH LEGS, BUT THAT HAS BEEN GOING ON FOR AWHILE AND HAS BEEN TREATED WITH MEDS AND WRAPS.   (Consider location/radiation/quality/duration/timing/severity/associated sxs/prior Treatment) The history is provided by the patient.    Past Medical History  Diagnosis Date  . Other congenital hamartoses, not elsewhere classified   . Family history of ischemic heart disease   . Family history of malignant neoplasm of gastrointestinal tract   . Allergic rhinitis, cause unspecified   . Other and unspecified hyperlipidemia   . Unspecified essential hypertension   . Obesity hypoventilation syndrome ([place])     failed cpap/bipap trial  . Hypothyroidism   . GERD (gastroesophageal reflux disease)   . Arthritis     osteoarthritis  . DEMENTIA     Alzheimer's, senile dementia  . Shortness of breath   . Cancer ([place])     renal carcinoma  . Sleep apnea     OXYGEN 24 HR/DAY-NO CPAP  . COPD (chronic obstructive pulmonary disease) (HCC)     [name] FOLLOWS PT--PT USING OXYGEN 24 HRS A DAY  . COPD (chronic obstructive pulmonary disease) (HCC)     PULMONARY CLEARANCE FOR LEFT RENAL CRYOABLATION ON CHART FROM DR. CLANCE -07/26/11  . Coronary artery disease     CARDIAC CLEARANCE  OFFICE NOTE FOR LEFT CRYO ABLATION ON CHART FROM [name] -"RECENT STRESS TEST DID NOT SHOW ISCHEMIA" -"PRESUMED CAD DUE TO CORONARY CALCIFICATIONS"   . Myocardial infarction ([place])   . Pneumonia     HX      Past Surgical History  Procedure Laterality Date  . Eye surgery      bilateral cataracts  . Joint replacement      bilateral knee  replacements  . Carpal tunnel release      right  . Thyroidectomy    . Cholecystectomy  2009  . Knee arthroplasty      2005 LEFT   AND 2007 RIGHT--[name]  . Tumor removal  09/14/2011   Family History  Problem Relation Age of Onset  . Cancer Other     colon  . Heart disease Other     CAD, MI and Heart Attack   Social History  Substance Use Topics  . Smoking status: Never Smoker   . Smokeless tobacco: Never Used  . Alcohol Use: No    Review of Systems  Constitutional: Positive for fatigue.  Respiratory: Positive for shortness of breath.   Gastrointestinal: Positive for abdominal pain.  Genitourinary: Positive for dysuria.  Skin: Positive for rash.  All other systems reviewed and are negative.     Allergies  Review of patient's allergies indicates no known allergies.  Home Medications   Prior to Admission medications   Medication Sig Start Date End Date Taking? Authorizing Provider  acetaminophen (TYLENOL) 500 MG tablet Take 1,000 mg by mouth 2 (two) times daily.   Yes Historical Provider, MD  calcitRIOL (ROCALTROL) 0.5 MCG capsule Take 0.5 mcg by mouth daily.   Yes Historical Provider, MD  calcium carbonate (TUMS - DOSED IN  MG ELEMENTAL CALCIUM) 500 MG chewable tablet Chew 2 tablets by mouth daily as needed for indigestion or heartburn.   Yes Historical Provider, MD  carboxymethylcellulose 1 % ophthalmic solution Place 1 drop into both eyes 2 (two) times daily.   Yes Historical Provider, MD  clopidogrel (PLAVIX) 75 MG tablet Take 75 mg by mouth every morning.    Yes Historical Provider, MD  cyanocobalamin 500 MCG tablet Take 500 mcg by mouth daily.   Yes Historical Provider, MD  cycloSPORINE (RESTASIS) 0.05 % ophthalmic emulsion Place 1 drop into both eyes 2 (two) times daily.    Yes Historical Provider, MD  fluticasone (FLONASE) 50 MCG/ACT nasal spray Place 1 spray into the nose daily as needed for allergies.    Yes Historical Provider, MD    HYDROcodone-acetaminophen (NORCO/VICODIN) 5-325 MG per tablet Take 0.5 tablets by mouth 2 (two) times daily as needed for moderate pain.   Yes Historical Provider, MD  ipratropium (ATROVENT) 0.02 % nebulizer solution Take 0.5 mg by nebulization every 6 (six) hours as needed for wheezing or shortness of breath.   Yes Historical Provider, MD  levothyroxine (SYNTHROID, LEVOTHROID) 200 MCG tablet Take 200 mcg by mouth daily.    Yes Historical Provider, MD  metoprolol tartrate (LOPRESSOR) 25 MG tablet Take 12.5 mg by mouth 2 (two) times daily. Hold for systolic blood pressure [id number]   Yes Historical Provider, MD  mometasone-formoterol (DULERA) 100-5 MCG/ACT AERO Inhale 2 puffs into the lungs 2 (two) times daily.   Yes Historical Provider, MD  polyethylene glycol powder (GLYCOLAX/MIRALAX) powder Take 1 Container by mouth 2 (two) times daily. Mix 1 capful (17g) in 8 ounces of fluid and drink   Yes Historical Provider, MD  pregabalin (LYRICA) 75 MG capsule Take 75 mg by mouth daily.    Yes Historical Provider, MD  Tamsulosin HCl (FLOMAX) 0.4 MG CAPS Take 0.4 mg by mouth daily. Hold if SBP < 100   Yes Historical Provider, MD  traMADol (ULTRAM) 50 MG tablet Take 100 mg by mouth 2 (two) times daily.   Yes Historical Provider, MD  traZODone (DESYREL) 150 MG tablet Take 300 mg by mouth at bedtime.   Yes Historical Provider, MD  albuterol (PROVENTIL) (2.5 MG/3ML) 0.083% nebulizer solution Take 2.5 mg by nebulization every 6 (six) hours as needed for wheezing or shortness of breath.    Historical Provider, MD  docusate sodium (COLACE) 100 MG capsule Take 100 mg by mouth 2 (two) times daily as needed for mild constipation or moderate constipation.    Historical Provider, MD  ipratropium (ATROVENT) 0.02 % nebulizer solution Take 500 mcg by nebulization every 4 (four) hours as needed for wheezing or shortness of breath.     Historical Provider, MD  levofloxacin (LEVAQUIN) 500 MG tablet Take 1 tablet (500 mg total) by  mouth daily. 02/15/16   Isla Pence, MD  nystatin cream (MYCOSTATIN) Apply topically 2 (two) times daily. 02/15/16   Isla Pence, MD  OXYGEN Inhale into the lungs at bedtime.    Historical Provider, MD   BP 125/69 mmHg  Pulse 68  Resp 18  Ht 6\' 3"  (1.905 m)  Wt 285 lb (129.275 kg)  BMI 35.62 kg/m2  SpO2 99% Physical Exam  Constitutional: He appears well-developed and well-nourished.  HENT:  Head: Normocephalic and atraumatic.  Right Ear: External ear normal.  Left Ear: External ear normal.  Mouth/Throat: Oropharynx is clear and moist.  Eyes: Conjunctivae and EOM are normal. Pupils are equal, round, and reactive to  light.  Neck: Normal range of motion. Neck supple.  Cardiovascular: Normal rate, regular rhythm and normal heart sounds.   Pulmonary/Chest: Effort normal and breath sounds normal.  Abdominal: Soft. Bowel sounds are normal.  Skin: Rash noted.     Psychiatric: He has a normal mood and affect.  Nursing note and vitals reviewed.   ED Course  Procedures (including critical care time) Labs Review Labs Reviewed  CBC - Abnormal; Notable for the following:    RBC 3.78 (*)    Hemoglobin 12.3 (*)    HCT 37.8 (*)    All other components within normal limits  COMPREHENSIVE METABOLIC PANEL - Abnormal; Notable for the following:    BUN 27 (*)    Creatinine, Ser 1.31 (*)    Calcium 7.6 (*)    GFR calc non Af Amer 51 (*)    GFR calc Af Amer 59 (*)    All other components within normal limits  URINALYSIS, ROUTINE W REFLEX MICROSCOPIC (NOT AT [facility]) - Abnormal; Notable for the following:    Ketones, ur 40 (*)    Leukocytes, UA MODERATE (*)    All other components within normal limits  URINE MICROSCOPIC-ADD ON - Abnormal; Notable for the following:    Squamous Epithelial / LPF 0-5 (*)    Bacteria, UA FEW (*)    All other components within normal limits  TROPONIN I  BRAIN NATRIURETIC PEPTIDE  MAGNESIUM    Imaging Review Dg Chest 2 View  02/15/2016  CLINICAL DATA:   Increasing shortness of breath today. EXAM: CHEST  2 VIEW COMPARISON:  September 26, 2015 FINDINGS: The heart size and mediastinal contours are stable. The heart size is enlarged. There is no focal infiltrate, pulmonary edema, or pleural effusion. The visualized skeletal structures are stable. IMPRESSION: No active cardiopulmonary disease. Electronically Signed   By: Abelardo Diesel M.D.   On: 02/15/2016 20:07   Ct Abdomen Pelvis W Contrast  02/15/2016  CLINICAL DATA:  Acute onset of generalized abdominal pain and testicular pain. Initial encounter. EXAM: CT ABDOMEN AND PELVIS WITH CONTRAST TECHNIQUE: Multidetector CT imaging of the abdomen and pelvis was performed using the standard protocol following bolus administration of intravenous contrast. CONTRAST:  187mL ISOVUE-300 IOPAMIDOL (ISOVUE-300) INJECTION 61% COMPARISON:  CT abdomen and pelvis from 07/10/2015 FINDINGS: There is mild somewhat nodular partial consolidation at both lower lung lobes, which may reflect an atypical infectious process. Scattered coronary artery calcifications are seen. The liver and spleen are unremarkable in appearance. The patient is status post cholecystectomy, with clips noted at the gallbladder fossa. The pancreas and adrenal glands are unremarkable. The mass at the lower pole of the left kidney has increased mildly in size since the prior study, measuring approximately 3.4 x 3.3 x 2.7 cm. Changes of prior cryoablation are noted, with surrounding calcification and chronic soft tissue inflammation. Nonspecific perinephric stranding is noted bilaterally. An additional 4 mm stone is noted at the interpole region of the left kidney. There is no evidence of hydronephrosis. No obstructing ureteral stones are identified. No free fluid is identified. The small bowel is unremarkable in appearance. The stomach is within normal limits. No acute vascular abnormalities are seen. Minimal calcification is noted along the left common iliac artery  and its branches. The appendix is normal in caliber and contains air, without evidence of appendicitis. Minimal diverticulosis is noted along the sigmoid colon, without evidence of diverticulitis. The bladder is mildly distended and grossly unremarkable. The prostate remains normal in size. No inguinal  lymphadenopathy is seen. No acute osseous abnormalities are identified. Multilevel vacuum phenomenon is noted along the lumbar spine, with endplate sclerotic change. IMPRESSION: 1. Mild somewhat nodular partial consolidation at the lung bases, which may reflect an atypical infectious process. Would correlate for any associated symptoms. 2. Previously cryoablated mass at the lower pole of the left kidney has increased mildly in size, measuring approximately 3.4 x 3.3 x 2.7 cm, compared to 3.1 x 3.0 x 2.7 cm on the prior study. This raises concern for persistent malignancy. Further evaluation is recommended. 3. Scattered coronary artery calcifications seen. 4. 4 mm nonobstructing stone at the interpole region of the left kidney. Mild calcification noted adjacent to the mass at the lower pole of the left kidney. 5. Mild degenerative change along the lumbar spine. 6. Minimal diverticulosis along the sigmoid colon, without evidence of diverticulitis. Electronically Signed   By: Garald Balding M.D.   On: 02/15/2016 22:13   I have personally reviewed and evaluated these images and lab results as part of my medical decision-making.   EKG Interpretation None      MDM  PT TOLD ABOUT THE ABNORMALITY IN LEFT KIDNEY.  HE WAS TOLD TO F/U WITH UROLOGIST.  PT IS FEELING BETTER AND WILL GO HOME.  HE IS INSTR TO RETURN IF WORSE. Final diagnoses:  Intertrigo  Atypical pneumonia        Isla Pence, MD 02/15/16 2242

## 2016-02-15 NOTE — ED Notes (Signed)
Awaiting PTAR for transport back to [facility].

## 2016-02-23 ENCOUNTER — Other Ambulatory Visit: Payer: Self-pay

## 2016-02-23 ENCOUNTER — Encounter (HOSPITAL_COMMUNITY): Payer: Self-pay | Admitting: Emergency Medicine

## 2016-02-23 ENCOUNTER — Ambulatory Visit: Payer: Medicare Other | Admitting: Internal Medicine

## 2016-02-23 ENCOUNTER — Emergency Department (HOSPITAL_COMMUNITY): Payer: Medicare Other

## 2016-02-23 ENCOUNTER — Inpatient Hospital Stay (HOSPITAL_COMMUNITY)
Admission: EM | Admit: 2016-02-23 | Discharge: 2016-02-27 | DRG: 291 | Disposition: A | Discharge status: Skilled Nursing Facility | Payer: Medicare Other | Attending: Internal Medicine | Admitting: Internal Medicine

## 2016-02-23 DIAGNOSIS — Z96653 Presence of artificial knee joint, bilateral: Secondary | ICD-10-CM | POA: Diagnosis present

## 2016-02-23 DIAGNOSIS — G4733 Obstructive sleep apnea (adult) (pediatric): Secondary | ICD-10-CM | POA: Diagnosis present

## 2016-02-23 DIAGNOSIS — I2510 Atherosclerotic heart disease of native coronary artery without angina pectoris: Secondary | ICD-10-CM | POA: Diagnosis present

## 2016-02-23 DIAGNOSIS — E873 Alkalosis: Secondary | ICD-10-CM | POA: Diagnosis present

## 2016-02-23 DIAGNOSIS — J440 Chronic obstructive pulmonary disease with acute lower respiratory infection: Secondary | ICD-10-CM | POA: Diagnosis present

## 2016-02-23 DIAGNOSIS — I10 Essential (primary) hypertension: Secondary | ICD-10-CM

## 2016-02-23 DIAGNOSIS — J410 Simple chronic bronchitis: Secondary | ICD-10-CM | POA: Diagnosis not present

## 2016-02-23 DIAGNOSIS — R251 Tremor, unspecified: Secondary | ICD-10-CM | POA: Diagnosis present

## 2016-02-23 DIAGNOSIS — R0600 Dyspnea, unspecified: Secondary | ICD-10-CM

## 2016-02-23 DIAGNOSIS — R0602 Shortness of breath: Secondary | ICD-10-CM | POA: Diagnosis present

## 2016-02-23 DIAGNOSIS — I252 Old myocardial infarction: Secondary | ICD-10-CM

## 2016-02-23 DIAGNOSIS — L03115 Cellulitis of right lower limb: Secondary | ICD-10-CM | POA: Diagnosis present

## 2016-02-23 DIAGNOSIS — R079 Chest pain, unspecified: Secondary | ICD-10-CM | POA: Diagnosis not present

## 2016-02-23 DIAGNOSIS — E662 Morbid (severe) obesity with alveolar hypoventilation: Secondary | ICD-10-CM | POA: Diagnosis present

## 2016-02-23 DIAGNOSIS — Z6833 Body mass index (BMI) 33.0-33.9, adult: Secondary | ICD-10-CM | POA: Diagnosis not present

## 2016-02-23 DIAGNOSIS — E860 Dehydration: Secondary | ICD-10-CM | POA: Diagnosis present

## 2016-02-23 DIAGNOSIS — R064 Hyperventilation: Secondary | ICD-10-CM | POA: Diagnosis present

## 2016-02-23 DIAGNOSIS — I712 Thoracic aortic aneurysm, without rupture: Secondary | ICD-10-CM | POA: Diagnosis present

## 2016-02-23 DIAGNOSIS — G309 Alzheimer's disease, unspecified: Secondary | ICD-10-CM | POA: Diagnosis present

## 2016-02-23 DIAGNOSIS — I5033 Acute on chronic diastolic (congestive) heart failure: Secondary | ICD-10-CM | POA: Diagnosis present

## 2016-02-23 DIAGNOSIS — I110 Hypertensive heart disease with heart failure: Secondary | ICD-10-CM | POA: Diagnosis present

## 2016-02-23 DIAGNOSIS — Z96611 Presence of right artificial shoulder joint: Secondary | ICD-10-CM | POA: Diagnosis present

## 2016-02-23 DIAGNOSIS — F419 Anxiety disorder, unspecified: Secondary | ICD-10-CM | POA: Diagnosis present

## 2016-02-23 DIAGNOSIS — Z85528 Personal history of other malignant neoplasm of kidney: Secondary | ICD-10-CM

## 2016-02-23 DIAGNOSIS — E039 Hypothyroidism, unspecified: Secondary | ICD-10-CM | POA: Diagnosis present

## 2016-02-23 DIAGNOSIS — N189 Chronic kidney disease, unspecified: Secondary | ICD-10-CM | POA: Diagnosis not present

## 2016-02-23 DIAGNOSIS — R109 Unspecified abdominal pain: Secondary | ICD-10-CM

## 2016-02-23 DIAGNOSIS — J449 Chronic obstructive pulmonary disease, unspecified: Secondary | ICD-10-CM | POA: Diagnosis present

## 2016-02-23 DIAGNOSIS — Z8701 Personal history of pneumonia (recurrent): Secondary | ICD-10-CM | POA: Diagnosis not present

## 2016-02-23 DIAGNOSIS — J189 Pneumonia, unspecified organism: Secondary | ICD-10-CM | POA: Diagnosis present

## 2016-02-23 DIAGNOSIS — F0391 Unspecified dementia with behavioral disturbance: Secondary | ICD-10-CM

## 2016-02-23 DIAGNOSIS — Z8249 Family history of ischemic heart disease and other diseases of the circulatory system: Secondary | ICD-10-CM | POA: Diagnosis not present

## 2016-02-23 DIAGNOSIS — N183 Chronic kidney disease, stage 3 (moderate): Secondary | ICD-10-CM | POA: Diagnosis present

## 2016-02-23 DIAGNOSIS — F0280 Dementia in other diseases classified elsewhere without behavioral disturbance: Secondary | ICD-10-CM | POA: Diagnosis present

## 2016-02-23 DIAGNOSIS — L03116 Cellulitis of left lower limb: Secondary | ICD-10-CM | POA: Diagnosis present

## 2016-02-23 DIAGNOSIS — J9811 Atelectasis: Secondary | ICD-10-CM | POA: Diagnosis present

## 2016-02-23 DIAGNOSIS — I130 Hypertensive heart and chronic kidney disease with heart failure and stage 1 through stage 4 chronic kidney disease, or unspecified chronic kidney disease: Secondary | ICD-10-CM | POA: Diagnosis present

## 2016-02-23 DIAGNOSIS — R05 Cough: Secondary | ICD-10-CM

## 2016-02-23 DIAGNOSIS — R0789 Other chest pain: Secondary | ICD-10-CM | POA: Diagnosis present

## 2016-02-23 DIAGNOSIS — R34 Anuria and oliguria: Secondary | ICD-10-CM | POA: Diagnosis present

## 2016-02-23 DIAGNOSIS — R059 Cough, unspecified: Secondary | ICD-10-CM

## 2016-02-23 DIAGNOSIS — Z9049 Acquired absence of other specified parts of digestive tract: Secondary | ICD-10-CM

## 2016-02-23 DIAGNOSIS — K219 Gastro-esophageal reflux disease without esophagitis: Secondary | ICD-10-CM | POA: Diagnosis present

## 2016-02-23 DIAGNOSIS — F0390 Unspecified dementia without behavioral disturbance: Secondary | ICD-10-CM | POA: Diagnosis present

## 2016-02-23 DIAGNOSIS — N179 Acute kidney failure, unspecified: Secondary | ICD-10-CM | POA: Diagnosis present

## 2016-02-23 DIAGNOSIS — L03119 Cellulitis of unspecified part of limb: Secondary | ICD-10-CM

## 2016-02-23 LAB — URINALYSIS, ROUTINE W REFLEX MICROSCOPIC
Glucose, UA: NEGATIVE mg/dL
HGB URINE DIPSTICK: NEGATIVE
Ketones, ur: >80 mg/dL — AB
Nitrite: NEGATIVE
PROTEIN: 30 mg/dL — AB
Specific Gravity, Urine: 1.035 — ABNORMAL HIGH (ref 1.005–1.030)
pH: 6.5 (ref 5.0–8.0)

## 2016-02-23 LAB — COMPREHENSIVE METABOLIC PANEL
ALK PHOS: 67 U/L (ref 38–126)
ALT: 14 U/L — AB (ref 17–63)
AST: 25 U/L (ref 15–41)
Albumin: 3.9 g/dL (ref 3.5–5.0)
Anion gap: 12 (ref 5–15)
BILIRUBIN TOTAL: 1.1 mg/dL (ref 0.3–1.2)
BUN: 26 mg/dL — AB (ref 6–20)
CALCIUM: 7.5 mg/dL — AB (ref 8.9–10.3)
CHLORIDE: 104 mmol/L (ref 101–111)
CO2: 25 mmol/L (ref 22–32)
CREATININE: 1.67 mg/dL — AB (ref 0.61–1.24)
GFR calc Af Amer: 44 mL/min — ABNORMAL LOW (ref >60)
GFR, EST NON AFRICAN AMERICAN: 38 mL/min — AB (ref >60)
Glucose, Bld: 96 mg/dL (ref 65–99)
Potassium: 4.1 mmol/L (ref 3.5–5.1)
Sodium: 141 mmol/L (ref 135–145)
TOTAL PROTEIN: 7.1 g/dL (ref 6.5–8.1)

## 2016-02-23 LAB — BLOOD GAS, VENOUS
ACID-BASE EXCESS: 1.3 mmol/L (ref 0.0–2.0)
Bicarbonate: 22.9 mEq/L (ref 20.0–24.0)
O2 SAT: 98 %
PCO2 VEN: 28.8 mmHg — AB (ref 45.0–50.0)
PH VEN: 7.512 — AB (ref 7.250–7.300)
Patient temperature: 98.6
TCO2: 19.8 mmol/L (ref 0–100)
pO2, Ven: 105 mmHg — ABNORMAL HIGH (ref 31.0–45.0)

## 2016-02-23 LAB — CBC WITH DIFFERENTIAL/PLATELET
BASOS PCT: 0 %
Basophils Absolute: 0 10*3/uL (ref 0.0–0.1)
EOS ABS: 0.1 10*3/uL (ref 0.0–0.7)
EOS PCT: 1 %
HCT: 38.8 % — ABNORMAL LOW (ref 39.0–52.0)
Hemoglobin: 12.9 g/dL — ABNORMAL LOW (ref 13.0–17.0)
LYMPHS ABS: 1 10*3/uL (ref 0.7–4.0)
Lymphocytes Relative: 11 %
MCH: 32.7 pg (ref 26.0–34.0)
MCHC: 33.2 g/dL (ref 30.0–36.0)
MCV: 98.2 fL (ref 78.0–100.0)
Monocytes Absolute: 0.7 10*3/uL (ref 0.1–1.0)
Monocytes Relative: 8 %
Neutro Abs: 6.7 10*3/uL (ref 1.7–7.7)
Neutrophils Relative %: 80 %
PLATELETS: 244 10*3/uL (ref 150–400)
RBC: 3.95 MIL/uL — AB (ref 4.22–5.81)
RDW: 13.8 % (ref 11.5–15.5)
WBC: 8.4 10*3/uL (ref 4.0–10.5)

## 2016-02-23 LAB — TROPONIN I: Troponin I: <0.03 ng/mL (ref <0.031)

## 2016-02-23 LAB — URINE MICROSCOPIC-ADD ON

## 2016-02-23 LAB — LIPASE, BLOOD: LIPASE: 27 U/L (ref 11–51)

## 2016-02-23 LAB — I-STAT CG4 LACTIC ACID, ED: Lactic Acid, Venous: 1.68 mmol/L (ref 0.5–2.0)

## 2016-02-23 LAB — MRSA PCR SCREENING: MRSA by PCR: NEGATIVE

## 2016-02-23 MED ORDER — SODIUM CHLORIDE 0.9 % IV BOLUS (SEPSIS)
1000.0000 mL | Freq: Once | INTRAVENOUS | Status: AC
  Administered 2016-02-23: 1000 mL via INTRAVENOUS

## 2016-02-23 MED ORDER — TRAMADOL HCL 50 MG PO TABS
100.0000 mg | ORAL_TABLET | Freq: Two times a day (BID) | ORAL | Status: DC
  Administered 2016-02-23 – 2016-02-27 (×8): 100 mg via ORAL
  Filled 2016-02-23 (×8): qty 2

## 2016-02-23 MED ORDER — TAMSULOSIN HCL 0.4 MG PO CAPS
0.4000 mg | ORAL_CAPSULE | Freq: Every day | ORAL | Status: DC
  Administered 2016-02-24 – 2016-02-27 (×4): 0.4 mg via ORAL
  Filled 2016-02-23 (×4): qty 1

## 2016-02-23 MED ORDER — HYDROCODONE-ACETAMINOPHEN 5-325 MG PO TABS
1.0000 | ORAL_TABLET | ORAL | Status: DC | PRN
  Administered 2016-02-24: 1 via ORAL
  Administered 2016-02-25: 2 via ORAL
  Administered 2016-02-25 – 2016-02-26 (×3): 1 via ORAL
  Filled 2016-02-23 (×6): qty 1

## 2016-02-23 MED ORDER — DEXTROSE 5 % IV SOLN
1.0000 g | INTRAVENOUS | Status: DC
  Administered 2016-02-24 – 2016-02-26 (×3): 1 g via INTRAVENOUS
  Filled 2016-02-23 (×3): qty 10

## 2016-02-23 MED ORDER — DEXTROSE 5 % IV SOLN
500.0000 mg | Freq: Once | INTRAVENOUS | Status: AC
  Administered 2016-02-23: 500 mg via INTRAVENOUS
  Filled 2016-02-23: qty 500

## 2016-02-23 MED ORDER — MOMETASONE FURO-FORMOTEROL FUM 100-5 MCG/ACT IN AERO
2.0000 | INHALATION_SPRAY | Freq: Two times a day (BID) | RESPIRATORY_TRACT | Status: DC
  Administered 2016-02-23 – 2016-02-27 (×8): 2 via RESPIRATORY_TRACT
  Filled 2016-02-23: qty 8.8

## 2016-02-23 MED ORDER — MORPHINE SULFATE (PF) 4 MG/ML IV SOLN
4.0000 mg | Freq: Once | INTRAVENOUS | Status: AC
  Administered 2016-02-23: 4 mg via INTRAVENOUS
  Filled 2016-02-23: qty 1

## 2016-02-23 MED ORDER — DEXTROSE 5 % IV SOLN
500.0000 mg | INTRAVENOUS | Status: DC
  Administered 2016-02-24 – 2016-02-26 (×3): 500 mg via INTRAVENOUS
  Filled 2016-02-23 (×3): qty 500

## 2016-02-23 MED ORDER — DEXTROSE 5 % IV SOLN
1.0000 g | Freq: Once | INTRAVENOUS | Status: AC
  Administered 2016-02-23: 1 g via INTRAVENOUS
  Filled 2016-02-23: qty 10

## 2016-02-23 MED ORDER — LEVOTHYROXINE SODIUM 200 MCG PO TABS
200.0000 ug | ORAL_TABLET | Freq: Every day | ORAL | Status: DC
  Administered 2016-02-24 – 2016-02-27 (×4): 200 ug via ORAL
  Filled 2016-02-23 (×3): qty 2
  Filled 2016-02-23 (×4): qty 1
  Filled 2016-02-23: qty 2
  Filled 2016-02-23: qty 1

## 2016-02-23 MED ORDER — SODIUM CHLORIDE 0.9 % IV SOLN
INTRAVENOUS | Status: DC
  Administered 2016-02-23: 22:00:00 via INTRAVENOUS

## 2016-02-23 MED ORDER — ENOXAPARIN SODIUM 30 MG/0.3ML ~~LOC~~ SOLN: 30.0000 mg | SUBCUTANEOUS | Status: DC

## 2016-02-23 MED ORDER — TRAZODONE HCL 100 MG PO TABS
300.0000 mg | ORAL_TABLET | Freq: Every day | ORAL | Status: DC
  Administered 2016-02-23 – 2016-02-26 (×4): 300 mg via ORAL
  Filled 2016-02-23 (×2): qty 6
  Filled 2016-02-23: qty 3
  Filled 2016-02-23 (×2): qty 6

## 2016-02-23 MED ORDER — IPRATROPIUM-ALBUTEROL 0.5-2.5 (3) MG/3ML IN SOLN
3.0000 mL | Freq: Two times a day (BID) | RESPIRATORY_TRACT | Status: DC
  Administered 2016-02-24 – 2016-02-25 (×3): 3 mL via RESPIRATORY_TRACT
  Filled 2016-02-23 (×3): qty 3

## 2016-02-23 MED ORDER — ONDANSETRON HCL 4 MG PO TABS: 4.0000 mg | ORAL_TABLET | Freq: Four times a day (QID) | ORAL | Status: DC | PRN

## 2016-02-23 MED ORDER — SODIUM CHLORIDE 0.9% FLUSH
3.0000 mL | Freq: Two times a day (BID) | INTRAVENOUS | Status: DC
  Administered 2016-02-24 – 2016-02-27 (×5): 3 mL via INTRAVENOUS

## 2016-02-23 MED ORDER — ACETAMINOPHEN 325 MG PO TABS
650.0000 mg | ORAL_TABLET | Freq: Four times a day (QID) | ORAL | Status: DC | PRN
  Administered 2016-02-25 – 2016-02-26 (×2): 650 mg via ORAL
  Filled 2016-02-23 (×3): qty 2

## 2016-02-23 MED ORDER — METOPROLOL TARTRATE 25 MG PO TABS
12.5000 mg | ORAL_TABLET | Freq: Two times a day (BID) | ORAL | Status: DC
  Administered 2016-02-23 – 2016-02-27 (×8): 12.5 mg via ORAL
  Filled 2016-02-23 (×8): qty 1

## 2016-02-23 MED ORDER — TAMSULOSIN HCL 0.4 MG PO CAPS: 0.4000 mg | ORAL_CAPSULE | Freq: Every day | ORAL | Status: DC

## 2016-02-23 MED ORDER — CALCITRIOL 0.5 MCG PO CAPS
0.5000 ug | ORAL_CAPSULE | Freq: Every day | ORAL | Status: DC
  Administered 2016-02-24 – 2016-02-27 (×4): 0.5 ug via ORAL
  Filled 2016-02-23 (×5): qty 1

## 2016-02-23 MED ORDER — IPRATROPIUM BROMIDE 0.02 % IN SOLN
0.5000 mg | Freq: Four times a day (QID) | RESPIRATORY_TRACT | Status: DC
  Administered 2016-02-23: 0.5 mg via RESPIRATORY_TRACT
  Filled 2016-02-23: qty 2.5

## 2016-02-23 MED ORDER — CLOPIDOGREL BISULFATE 75 MG PO TABS
75.0000 mg | ORAL_TABLET | Freq: Every day | ORAL | Status: DC
  Administered 2016-02-24 – 2016-02-27 (×4): 75 mg via ORAL
  Filled 2016-02-23 (×4): qty 1

## 2016-02-23 MED ORDER — ONDANSETRON HCL 4 MG/2ML IJ SOLN: 4.0000 mg | Freq: Four times a day (QID) | INTRAMUSCULAR | Status: DC | PRN

## 2016-02-23 MED ORDER — DOCUSATE SODIUM 100 MG PO CAPS
100.0000 mg | ORAL_CAPSULE | Freq: Two times a day (BID) | ORAL | Status: DC | PRN
  Administered 2016-02-27: 100 mg via ORAL
  Filled 2016-02-23: qty 1

## 2016-02-23 MED ORDER — FLUTICASONE PROPIONATE 50 MCG/ACT NA SUSP
1.0000 | Freq: Every day | NASAL | Status: DC
  Administered 2016-02-24 – 2016-02-27 (×4): 1 via NASAL
  Filled 2016-02-23: qty 16

## 2016-02-23 MED ORDER — ACETAMINOPHEN 650 MG RE SUPP: 650.0000 mg | Freq: Four times a day (QID) | RECTAL | Status: DC | PRN

## 2016-02-23 MED ORDER — ENOXAPARIN SODIUM 40 MG/0.4ML ~~LOC~~ SOLN
40.0000 mg | SUBCUTANEOUS | Status: DC
  Administered 2016-02-23 – 2016-02-26 (×4): 40 mg via SUBCUTANEOUS
  Filled 2016-02-23 (×4): qty 0.4

## 2016-02-23 MED ORDER — GUAIFENESIN ER 600 MG PO TB12
600.0000 mg | ORAL_TABLET | Freq: Two times a day (BID) | ORAL | Status: DC
  Administered 2016-02-23 – 2016-02-27 (×8): 600 mg via ORAL
  Filled 2016-02-23 (×8): qty 1

## 2016-02-23 MED ORDER — LEVOTHYROXINE SODIUM 25 MCG PO TABS
25.0000 ug | ORAL_TABLET | Freq: Every day | ORAL | Status: DC
  Administered 2016-02-24 – 2016-02-27 (×4): 25 ug via ORAL
  Filled 2016-02-23 (×4): qty 1

## 2016-02-23 MED ORDER — POLYETHYLENE GLYCOL 3350 17 G PO PACK
17.0000 g | PACK | Freq: Two times a day (BID) | ORAL | Status: DC | PRN
  Administered 2016-02-27: 17 g via ORAL
  Filled 2016-02-23: qty 1

## 2016-02-23 MED ORDER — HYDROCODONE-ACETAMINOPHEN 5-325 MG PO TABS: 0.5000 | ORAL_TABLET | Freq: Two times a day (BID) | ORAL | Status: DC | PRN

## 2016-02-23 MED ORDER — POLYETHYLENE GLYCOL 3350 17 GM/SCOOP PO POWD: 1.0000 | Freq: Two times a day (BID) | ORAL | Status: DC | PRN

## 2016-02-23 MED ORDER — CYCLOSPORINE 0.05 % OP EMUL
1.0000 [drp] | Freq: Two times a day (BID) | OPHTHALMIC | Status: DC
  Administered 2016-02-23 – 2016-02-27 (×8): 1 [drp] via OPHTHALMIC
  Filled 2016-02-23 (×9): qty 1

## 2016-02-23 MED ORDER — ALBUTEROL SULFATE (2.5 MG/3ML) 0.083% IN NEBU: 2.5000 mg | INHALATION_SOLUTION | Freq: Four times a day (QID) | RESPIRATORY_TRACT | Status: DC | PRN

## 2016-02-23 MED ORDER — IOPAMIDOL (ISOVUE-370) INJECTION 76%
100.0000 mL | Freq: Once | INTRAVENOUS | Status: AC | PRN
  Administered 2016-02-23: 100 mL via INTRAVENOUS

## 2016-02-23 NOTE — H&P (Signed)
[name] [name] [id number] DOB: [birth year] DOA: 02/23/2016   Referring MD Zachery Dakins  PCP: Reymundo Poll, MD   Outpatient Specialists: pulmonology [facility]  Patient coming from: Assisted living  Chief Complaint: The gastric pain and shortness of breath  HPI: [name] [name] is a 75 y.o. male with medical history significant of dementia, COPD, coronary artery disease, ischemic heart disease renal cell carcinoma OSA on oxygen as needed    Presented with   a one-week history of epigastric abdominal pain associated is some bilateral lateral chest pain coughing, he has been having rapid respirations.  subjective feeing a cough nonproductive or white sputum and not associated with nausea vomiting no diarrhea. This is been associated with increased anxiety tremor hyperventilating. He has been seen about 2 weeks ago and was told he has pneumonia started on Levaquin.   Regarding pertinent Chronic problems: Regarding history of coronary artery disease Prior stress test showed no evidence of inducible cardiac ischemia Patient is chronic cellulitis in both legs is been going on for "a while" the redness has been increasing and decreasing not associated with warmth for pain he's been treated with wraps. Patient has known history of left kidney lower pole mass measure free by 3 x 2 cm with chronic soft tissue inflammation he has been told in the past that he needs to follow-up with urology unclear if he did  IN ER: Heart rate 80 respirations up to 30 initially blood pressure down to 99/55 now improved up to 116/60 free after 2 L of fluids WBC 8.4, hemoglobin 12.9, sodium 141, creatinine increased to 1.67 troponin unremarkable lactic acid 1.68 Chest x-ray showing stable cardiac prominence no edema or consolidation Given persistent shortness of breath CT chest was done showing no evidence of pulmonary embolism but by basilar pneumonia. Ascending thoracic aorta measuring 4.3 x 4 cm recommending following up  CT in 1 year   Hospitalist was called for admission for community-acquired pneumonia with failed outpatient treatment  Review of Systems:    Pertinent positives include:  Fevers, chills, fatigue, shortness of breath at rest. productive cough heartburn abdominal pain, n Constitutional:  No weight loss, night sweats,, weight loss  HEENT:  No headaches, Difficulty swallowing,Tooth/dental problems,Sore throat,  No sneezing, itching, ear ache, nasal congestion, post nasal drip,  Cardio-vascular:  No chest pain, Orthopnea, PND, anasarca, dizziness, palpitations.no Bilateral lower extremity swelling  GI:  No , indigestion,ausea, vomiting, diarrhea, change in bowel habits, loss of appetite, melena, blood in stool, hematemesis Resp:  ,No dyspnea on exertion, No excess mucus, no, No non-productive cough, No coughing up of blood.No change in color of mucus.No wheezing. Skin:  no rash or lesions. No jaundice GU:  no dysuria, change in color of urine, no urgency or frequency. No straining to urinate.  No flank pain.  Musculoskeletal:  No joint pain or no joint swelling. No decreased range of motion. No back pain.  Psych:  No change in mood or affect. No depression or anxiety. No memory loss.  Neuro: no localizing neurological complaints, no tingling, no weakness, no double vision, no gait abnormality, no slurred speech, no confusion  As per HPI otherwise 10 point review of systems negative.   Past Medical History: Past Medical History  Diagnosis Date  . Other congenital hamartoses, not elsewhere classified   . Family history of ischemic heart disease   . Family history of malignant neoplasm of gastrointestinal tract   . Allergic rhinitis, cause unspecified   . Other and unspecified hyperlipidemia   .  Unspecified essential hypertension   . Obesity hypoventilation syndrome ([place])     failed cpap/bipap trial  . Hypothyroidism   . GERD (gastroesophageal reflux disease)   . Arthritis      osteoarthritis  . DEMENTIA     Alzheimer's, senile dementia  . Shortness of breath   . Cancer ([place])     renal carcinoma  . Sleep apnea     OXYGEN 24 HR/DAY-NO CPAP  . COPD (chronic obstructive pulmonary disease) (HCC)     [name] FOLLOWS PT--PT USING OXYGEN 24 HRS A DAY  . COPD (chronic obstructive pulmonary disease) (HCC)     PULMONARY CLEARANCE FOR LEFT RENAL CRYOABLATION ON CHART FROM DR. CLANCE -07/26/11  . Coronary artery disease     CARDIAC CLEARANCE  OFFICE NOTE FOR LEFT CRYO ABLATION ON CHART FROM [name] -"RECENT STRESS TEST DID NOT SHOW ISCHEMIA" -"PRESUMED CAD DUE TO CORONARY CALCIFICATIONS"   . Myocardial infarction ([place])   . Pneumonia     HX      Past Surgical History  Procedure Laterality Date  . Eye surgery      bilateral cataracts  . Joint replacement      bilateral knee replacements  . Carpal tunnel release      right  . Thyroidectomy    . Cholecystectomy  2009  . Knee arthroplasty      2005 LEFT   AND 2007 RIGHT--[name]  . Tumor removal  09/14/2011     Social History:  Ambulatory sitting walker    From facility [facility]   reports that he has never smoked. He has never used smokeless tobacco. He reports that he does not drink alcohol or use illicit drugs.  Allergies:  No Known Allergies     Family History:    Family History  Problem Relation Age of Onset  . Cancer Other     colon  . Heart disease Other     CAD, MI and Heart Attack    Medications: Prior to Admission medications   Medication Sig Start Date End Date Taking? Authorizing Provider  acetaminophen (TYLENOL) 500 MG tablet Take 1,000 mg by mouth 2 (two) times daily.   Yes Historical Provider, MD  albuterol (PROVENTIL) (2.5 MG/3ML) 0.083% nebulizer solution Take 2.5 mg by nebulization every 6 (six) hours as needed for wheezing or shortness of breath.   Yes Historical Provider, MD  calcitRIOL (ROCALTROL) 0.5 MCG capsule Take 0.5 mcg by mouth  daily.   Yes Historical Provider, MD  calcium carbonate (TUMS - DOSED IN MG ELEMENTAL CALCIUM) 500 MG chewable tablet Chew 2 tablets by mouth daily as needed for indigestion or heartburn.   Yes Historical Provider, MD  carboxymethylcellulose 1 % ophthalmic solution Place 1 drop into both eyes 2 (two) times daily.   Yes Historical Provider, MD  clopidogrel (PLAVIX) 75 MG tablet Take 75 mg by mouth every morning.    Yes Historical Provider, MD  cyanocobalamin 500 MCG tablet Take 500 mcg by mouth daily.   Yes Historical Provider, MD  cycloSPORINE (RESTASIS) 0.05 % ophthalmic emulsion Place 1 drop into both eyes 2 (two) times daily.    Yes Historical Provider, MD  Dextromethorphan-Guaifenesin (DIABETIC TUSSIN DM MAX ST PO) Take 5 mLs by mouth 2 (two) times daily as needed (for cough).   Yes Historical Provider, MD  docusate sodium (COLACE) 100 MG capsule Take 100 mg by mouth 2 (two) times daily as needed for mild constipation or moderate constipation.  Yes Historical Provider, MD  fluticasone (FLONASE) 50 MCG/ACT nasal spray Place 1 spray into the nose daily as needed for allergies.    Yes Historical Provider, MD  HYDROcodone-acetaminophen (NORCO/VICODIN) 5-325 MG per tablet Take 0.5 tablets by mouth 2 (two) times daily as needed for moderate pain.   Yes Historical Provider, MD  ipratropium (ATROVENT) 0.02 % nebulizer solution Take 0.5 mg by nebulization every 6 (six) hours as needed for wheezing or shortness of breath.   Yes Historical Provider, MD  levofloxacin (LEVAQUIN) 500 MG tablet Take 1 tablet (500 mg total) by mouth daily. Patient taking differently: Take 500 mg by mouth daily. Started 04/28 for 7 days 02/15/16  Yes Isla Pence, MD  levothyroxine (SYNTHROID, LEVOTHROID) 200 MCG tablet Take 200 mcg by mouth daily.    Yes Historical Provider, MD  levothyroxine (SYNTHROID, LEVOTHROID) 25 MCG tablet Take 25 mcg by mouth daily before breakfast.   Yes Historical Provider, MD  metoprolol tartrate  (LOPRESSOR) 25 MG tablet Take 12.5 mg by mouth 2 (two) times daily. Hold for systolic blood pressure [id number]   Yes Historical Provider, MD  mometasone-formoterol (DULERA) 100-5 MCG/ACT AERO Inhale 2 puffs into the lungs 2 (two) times daily.   Yes Historical Provider, MD  nystatin cream (MYCOSTATIN) Apply topically 2 (two) times daily. Patient taking differently: Apply 1 application topically 2 (two) times daily.  02/15/16  Yes Isla Pence, MD  OXYGEN Inhale into the lungs at bedtime.   Yes Historical Provider, MD  polyethylene glycol powder (GLYCOLAX/MIRALAX) powder Take 1 Container by mouth 2 (two) times daily. Mix 1 capful (17g) in 8 ounces of fluid and drink   Yes Historical Provider, MD  Tamsulosin HCl (FLOMAX) 0.4 MG CAPS Take 0.4 mg by mouth daily. Hold if SBP < 100   Yes Historical Provider, MD  traMADol (ULTRAM) 50 MG tablet Take 100 mg by mouth 2 (two) times daily.   Yes Historical Provider, MD  traZODone (DESYREL) 150 MG tablet Take 300 mg by mouth at bedtime.   Yes Historical Provider, MD    Physical Exam: Patient Vitals for the past 24 hrs:  BP Temp Temp src Pulse Resp SpO2  02/23/16 1903 116/88 mmHg - - 80 18 96 %  02/23/16 1828 116/63 mmHg - - 74 19 97 %  02/23/16 1711 117/66 mmHg - - 78 (!) 30 96 %  02/23/16 1500 115/68 mmHg - - 72 24 99 %  02/23/16 1453 - 99.4 F (37.4 C) Rectal - - -  02/23/16 1346 99/55 mmHg 98.7 F (37.1 C) Oral 80 24 98 %  02/23/16 1334 - - - - - 96 %    1. General:  in No Acute distress 2. Psychological: Alert and  Oriented 3. Head/ENT:    Dry Mucous Membranes                          Head Non traumatic, neck supple                            Poor Dentition 4. SKIN:  decreased Skin turgor,  Skin clean Dry and intact erythema of lower extremeties no warmth. 5. Heart: Regular rate and rhythm no  Murmur, Rub or gallop 6. Lungs:  no wheezes  Mild crackles   7. Abdomen: Soft, non-tender, Non distended 8. Lower extremities: no clubbing, cyanosis, or  edema 9. Neurologically Grossly intact, moving all 4 extremities equally 10.  MSK: Normal range of motion   body mass index is unknown because there is no weight on file.  Labs on Admission:   Labs on Admission: I have personally reviewed following labs and imaging studies  CBC:  Recent Labs Lab 02/23/16 1422  WBC 8.4  NEUTROABS 6.7  HGB 12.9*  HCT 38.8*  MCV 98.2  PLT [id number]   Basic Metabolic Panel:  Recent Labs Lab 02/23/16 1422  NA 141  K 4.1  CL 104  CO2 25  GLUCOSE 96  BUN 26*  CREATININE 1.67*  CALCIUM 7.5*   GFR: Estimated Creatinine Clearance: 54.5 mL/min (by C-G formula based on Cr of 1.67). Liver Function Tests:  Recent Labs Lab 02/23/16 1422  AST 25  ALT 14*  ALKPHOS 67  BILITOT 1.1  PROT 7.1  ALBUMIN 3.9    Recent Labs Lab 02/23/16 1533  LIPASE 27   No results for input(s): AMMONIA in the last 168 hours. Coagulation Profile: No results for input(s): INR, PROTIME in the last 168 hours. Cardiac Enzymes:  Recent Labs Lab 02/23/16 1549  TROPONINI <0.03   BNP (last 3 results) No results for input(s): PROBNP in the last 8760 hours. HbA1C: No results for input(s): HGBA1C in the last 72 hours. CBG: No results for input(s): GLUCAP in the last 168 hours. Lipid Profile: No results for input(s): CHOL, HDL, LDLCALC, TRIG, CHOLHDL, LDLDIRECT in the last 72 hours. Thyroid Function Tests: No results for input(s): TSH, T4TOTAL, FREET4, T3FREE, THYROIDAB in the last 72 hours. Anemia Panel: No results for input(s): VITAMINB12, FOLATE, FERRITIN, TIBC, IRON, RETICCTPCT in the last 72 hours. Urine analysis:    Component Value Date/Time   COLORURINE AMBER* 02/23/2016 [place] 02/23/2016 1541   LABSPEC 1.035* 02/23/2016 1541   PHURINE 6.5 02/23/2016 1541   GLUCOSEU NEGATIVE 02/23/2016 1541   HGBUR NEGATIVE 02/23/2016 1541   BILIRUBINUR SMALL* 02/23/2016 1541   KETONESUR >80* 02/23/2016 1541   PROTEINUR 30* 02/23/2016 1541    UROBILINOGEN 0.2 07/10/2015 1630   NITRITE NEGATIVE 02/23/2016 1541   LEUKOCYTESUR TRACE* 02/23/2016 1541   Sepsis Labs: @LABRCNTIP (procalcitonin:4,lacticidven:4) )No results found for this or any previous visit (from the past 240 hour(s)).    ABG 7.512/28.8/ 105   UA  no evidence of UTI  Lab Results  Component Value Date   HGBA1C * 12/09/2009    6.4 (NOTE) The ADA recommends the following therapeutic goal for glycemic control related to Hgb A1c measurement: Goal of therapy: <6.5 Hgb A1c  Reference: American Diabetes Association: Clinical Practice Recommendations 2010, Diabetes Care, 2010, 33: (Suppl  1).    Estimated Creatinine Clearance: 54.5 mL/min (by C-G formula based on Cr of 1.67).  BNP (last 3 results) No results for input(s): PROBNP in the last 8760 hours.   ECG REPORT  Independently reviewed Rate:71  Rhythm: Normal sinus rhythm ST&T Change: No acute ischemic changes  QTC 447  There were no vitals filed for this visit.   Cultures:    Component Value Date/Time   SDES URINE, CLEAN CATCH 05/24/2012 0321   SPECREQUEST NONE 05/24/2012 0321   CULT  05/24/2012 0321    Multiple bacterial morphotypes present, none predominant. Suggest appropriate recollection if clinically indicated.   REPTSTATUS 05/25/2012 FINAL 05/24/2012 0321     Radiological Exams on Admission: Dg Chest 2 View  02/23/2016  CLINICAL DATA:  Shortness of breath. History of renal cell carcinoma EXAM: CHEST  2 VIEW COMPARISON:  February 15, 2016 FINDINGS: There is no edema or  consolidation. Heart is mildly enlarged with pulmonary vascularity within normal limits. No adenopathy. There is evidence of a total shoulder replacement on the right. There is degenerative change in each shoulder as well as in the thoracic spine. IMPRESSION: Stable cardiac prominence.  No edema or consolidation. Electronically Signed   By: Lowella Grip III M.D.   On: 02/23/2016 15:24   Ct Angio Chest Pe W/cm &/or Wo  Cm  02/23/2016  CLINICAL DATA:  Shortness of breath and chest pain EXAM: CT ANGIOGRAPHY CHEST WITH CONTRAST TECHNIQUE: Multidetector CT imaging of the chest was performed using the standard protocol during bolus administration of intravenous contrast. Multiplanar CT image reconstructions and MIPs were obtained to evaluate the vascular anatomy. CONTRAST:  100 mL Isovue 370 nonionic COMPARISON:  Chest CT January 16, 2012; chest radiograph Feb 23, 2016 FINDINGS: Mediastinum/Lymph Nodes: There is no demonstrable pulmonary embolus. There is prominence of the ascending thoracic aorta with a maximum transverse diameter of 4.3 x 4.0 cm. There is no thoracic aortic dissection. The visualized great vessels appear unremarkable. There is minimal pericardial effusion adjacent to the left ventricle superiorly. There are scattered foci of coronary artery calcification. There is no appreciable thoracic adenopathy. Lungs/Pleura: There is airspace consolidation in the lung bases bilaterally, primarily in the posterior and lateral segments of the left lower lobe and in the lateral segment of the right lower lobe. Mild atelectasis is also noted in the lung bases. Upper abdomen: Visualized upper abdominal structures appear unremarkable. Musculoskeletal: There is degenerative change in the thoracic spine noted. There are no blastic or lytic bone lesions. There is a total shoulder replacement on the right with artifact from this metallic prosthesis. Review of the MIP images confirms the above findings. IMPRESSION: No demonstrable pulmonary embolus. Bibasilar pneumonia, slightly more on the left than on the right. Prominence of the ascending thoracic aorta with a maximum transverse diameter of 4.3 x 4.0 cm. Recommend annual imaging followup by CTA or MRA. This recommendation follows 2010 ACCF/AHA/AATS/ACR/ASA/SCA/SCAI/SIR/STS/SVM Guidelines for the Diagnosis and Management of Patients with Thoracic Aortic Disease. Circulation. 2010; [id number]. No aortic dissection seen. No appreciable thoracic adenopathy. Electronically Signed   By: Lowella Grip III M.D.   On: 02/23/2016 18:02    Chart has been reviewed    Assessment/Plan  75 y.o. male with medical history significant of dementia, COPD, coronary artery disease, ischemic heart disease renal cell carcinoma OSA on oxygen as needed presents with worsening dyspnea was found to have worsening pneumonia despite being treated with Levaquin. Been admitted for outpatient failure of antibiotic treatment   Present on Admission:    . CAP (community acquired pneumonia) - will admit for treatment of CAP will start on appropriate antibiotic coverage Rocephin and azithromycin since patient failed Levaquin.   Obtain sputum cultures, blood cultures if febrile or if decompensates.  Provide oxygen as needed.  Marland Kitchen COPD (chronic obstructive pulmonary disease) ([place]) currently stable all beautifully as needed continue scheduled Atrovent and dulera . Cellulitis, leg patient reports chronic cellulitis with redness getting worse slightly lately. For right now will call her Rocephin if needed will add broader coverage . Oliguria - obtain renal ultrasound likely secondary to mild dehydration. Patient does has history of renal carcinoma.  Marland Kitchen Respiratory alkalosis - suspect psychologically induced increased rate of breathing oxygen saturation 94% on RA patient with episodes of increased rate of breathing but no increased work of breathing . Chest pain/dyspnea closer to chest discomfort and discomfort with breathing most likely secondary to  found pneumonia. But given history of coronary disease will cycle cardiac enzymes monitor on telemetry and given history of ischemic cardiac disease will obtain echogram to document EF  . Acute on chronic renal failure (HCC) - obtain urine electrolytes gentle fluid rehydration follow creatinine Ascending thoracic aortic aneurysm - benefit from vascular surgery  follow-up and repeated imaging. . Dementia chronic currently stable . Essential hypertension -  mild not on home medications. Currently somewhat soft blood pressure  . OSA (obstructive sleep apnea) - not on C Pap continue oxygen only as needed at night Other plan as per orders. Renal mass will need to establish follow-up with urology at the time of discharge  DVT prophylaxis:   Lovenox     Code Status:  FULL CODE  as per patient   Family Communication:   Family not  at  Bedside    Disposition Plan:                            Back to current facility when stable  Consults called: none    Admission status:    inpatient      Level of care     tele       I have spent a total of 56 min on this admission   Sharah Finnell 02/23/2016, 8:34 PM    [facility]  Pager [phone]   after 2 AM please page floor coverage PA If 7AM-7PM, please contact the day team taking care of the patient  [email or web address]  Password [email or web address]

## 2016-02-23 NOTE — ED Notes (Signed)
Bed: [facility] Expected date:  Expected time:  Means of arrival:  Comments: EMS-rib cage pain from recent PNA

## 2016-02-23 NOTE — ED Notes (Signed)
Spoke with lab tech, who stated we would be able to run a Troponin off the blood we drew earlier.

## 2016-02-23 NOTE — ED Notes (Signed)
Per EMS, from [facility]. Has recently been dx with pneumonia. C/o right sided post tussive chest pain. Has taken 4 out of 7 days of his antibiotics. EMS states patient appears to be very anxious at this time. Patient currently yelling in the hall for help. EMS states occasionally hyperventilating, occasionally shakes.

## 2016-02-23 NOTE — Progress Notes (Signed)
PHARMACY NOTE -  ANTIBIOTIC RENAL DOSE ADJUSTMENT    Request received for Pharmacy to assist with antibiotic renal dose adjustment.   Patient has been initiated on Rocephin and Zithromax for CAP.  Neither antibiotic requires renal adjustment  Current dosage is appropriate and need for further dosage adjustment appears unlikely at present.  Will sign off at this time.  Please reconsult if a change in clinical status warrants re-evaluation of dosage.   Reuel Boom, PharmD, BCPS Pager: [phone] 02/23/2016, 9:38 PM

## 2016-02-23 NOTE — ED Notes (Signed)
MD request to hold off blood culture at this time. First set at bedside

## 2016-02-23 NOTE — ED Provider Notes (Signed)
CSN: [id number]     Arrival date & time 02/23/16  1328 History   First MD Initiated Contact with Patient 02/23/16 1351     Chief Complaint  Patient presents with  . Cough     (Consider location/radiation/quality/duration/timing/severity/associated sxs/prior Treatment) HPI Level V caveat dementia patient complains of shortness of breath and epigastric pain intermittently for a week. He states that he can't get comfortable in any position. He denies vomiting. He appears anxious. Patient seen here on 02/15/2016, prescribed Levaquin and Mycostatin, without relief.  Past Medical History  Diagnosis Date  . Other congenital hamartoses, not elsewhere classified   . Family history of ischemic heart disease   . Family history of malignant neoplasm of gastrointestinal tract   . Allergic rhinitis, cause unspecified   . Other and unspecified hyperlipidemia   . Unspecified essential hypertension   . Obesity hypoventilation syndrome ([place])     failed cpap/bipap trial  . Hypothyroidism   . GERD (gastroesophageal reflux disease)   . Arthritis     osteoarthritis  . DEMENTIA     Alzheimer's, senile dementia  . Shortness of breath   . Cancer ([place])     renal carcinoma  . Sleep apnea     OXYGEN 24 HR/DAY-NO CPAP  . COPD (chronic obstructive pulmonary disease) (HCC)     [name] FOLLOWS PT--PT USING OXYGEN 24 HRS A DAY  . COPD (chronic obstructive pulmonary disease) (HCC)     PULMONARY CLEARANCE FOR LEFT RENAL CRYOABLATION ON CHART FROM DR. CLANCE -07/26/11  . Coronary artery disease     CARDIAC CLEARANCE  OFFICE NOTE FOR LEFT CRYO ABLATION ON CHART FROM [name] -"RECENT STRESS TEST DID NOT SHOW ISCHEMIA" -"PRESUMED CAD DUE TO CORONARY CALCIFICATIONS"   . Myocardial infarction ([place])   . Pneumonia     HX      Past Surgical History  Procedure Laterality Date  . Eye surgery      bilateral cataracts  . Joint replacement      bilateral knee replacements  . Carpal tunnel release       right  . Thyroidectomy    . Cholecystectomy  2009  . Knee arthroplasty      2005 LEFT   AND 2007 RIGHT--[name]  . Tumor removal  09/14/2011   Family History  Problem Relation Age of Onset  . Cancer Other     colon  . Heart disease Other     CAD, MI and Heart Attack   Social History  Substance Use Topics  . Smoking status: Never Smoker   . Smokeless tobacco: Never Used  . Alcohol Use: No    Review of Systems  Unable to perform ROS: Dementia  Respiratory: Positive for shortness of breath.   Gastrointestinal: Positive for abdominal pain.      Allergies  Review of patient's allergies indicates no known allergies.  Home Medications   Prior to Admission medications   Medication Sig Start Date End Date Taking? Authorizing Provider  acetaminophen (TYLENOL) 500 MG tablet Take 1,000 mg by mouth 2 (two) times daily.   Yes Historical Provider, MD  albuterol (PROVENTIL) (2.5 MG/3ML) 0.083% nebulizer solution Take 2.5 mg by nebulization every 6 (six) hours as needed for wheezing or shortness of breath.   Yes Historical Provider, MD  calcitRIOL (ROCALTROL) 0.5 MCG capsule Take 0.5 mcg by mouth daily.   Yes Historical Provider, MD  calcium carbonate (TUMS - DOSED IN MG ELEMENTAL CALCIUM) 500 MG chewable tablet Chew 2  tablets by mouth daily as needed for indigestion or heartburn.   Yes Historical Provider, MD  carboxymethylcellulose 1 % ophthalmic solution Place 1 drop into both eyes 2 (two) times daily.   Yes Historical Provider, MD  clopidogrel (PLAVIX) 75 MG tablet Take 75 mg by mouth every morning.    Yes Historical Provider, MD  cyanocobalamin 500 MCG tablet Take 500 mcg by mouth daily.   Yes Historical Provider, MD  cycloSPORINE (RESTASIS) 0.05 % ophthalmic emulsion Place 1 drop into both eyes 2 (two) times daily.    Yes Historical Provider, MD  Dextromethorphan-Guaifenesin (DIABETIC TUSSIN DM MAX ST PO) Take 5 mLs by mouth 2 (two) times daily as needed (for cough).   Yes  Historical Provider, MD  docusate sodium (COLACE) 100 MG capsule Take 100 mg by mouth 2 (two) times daily as needed for mild constipation or moderate constipation.   Yes Historical Provider, MD  fluticasone (FLONASE) 50 MCG/ACT nasal spray Place 1 spray into the nose daily as needed for allergies.    Yes Historical Provider, MD  HYDROcodone-acetaminophen (NORCO/VICODIN) 5-325 MG per tablet Take 0.5 tablets by mouth 2 (two) times daily as needed for moderate pain.   Yes Historical Provider, MD  ipratropium (ATROVENT) 0.02 % nebulizer solution Take 0.5 mg by nebulization every 6 (six) hours as needed for wheezing or shortness of breath.   Yes Historical Provider, MD  levofloxacin (LEVAQUIN) 500 MG tablet Take 1 tablet (500 mg total) by mouth daily. Patient taking differently: Take 500 mg by mouth daily. Started 04/28 for 7 days 02/15/16  Yes Isla Pence, MD  levothyroxine (SYNTHROID, LEVOTHROID) 200 MCG tablet Take 200 mcg by mouth daily.    Yes Historical Provider, MD  levothyroxine (SYNTHROID, LEVOTHROID) 25 MCG tablet Take 25 mcg by mouth daily before breakfast.   Yes Historical Provider, MD  metoprolol tartrate (LOPRESSOR) 25 MG tablet Take 12.5 mg by mouth 2 (two) times daily. Hold for systolic blood pressure [id number]   Yes Historical Provider, MD  mometasone-formoterol (DULERA) 100-5 MCG/ACT AERO Inhale 2 puffs into the lungs 2 (two) times daily.   Yes Historical Provider, MD  nystatin cream (MYCOSTATIN) Apply topically 2 (two) times daily. Patient taking differently: Apply 1 application topically 2 (two) times daily.  02/15/16  Yes Isla Pence, MD  OXYGEN Inhale into the lungs at bedtime.   Yes Historical Provider, MD  polyethylene glycol powder (GLYCOLAX/MIRALAX) powder Take 1 Container by mouth 2 (two) times daily. Mix 1 capful (17g) in 8 ounces of fluid and drink   Yes Historical Provider, MD  Tamsulosin HCl (FLOMAX) 0.4 MG CAPS Take 0.4 mg by mouth daily. Hold if SBP < 100   Yes Historical  Provider, MD  traMADol (ULTRAM) 50 MG tablet Take 100 mg by mouth 2 (two) times daily.   Yes Historical Provider, MD  traZODone (DESYREL) 150 MG tablet Take 300 mg by mouth at bedtime.   Yes Historical Provider, MD   BP 117/66 mmHg  Pulse 78  Temp(Src) 99.4 F (37.4 C) (Rectal)  Resp 30  SpO2 96% Physical Exam  Constitutional: He appears well-developed and well-nourished. He appears distressed.  Appears uncomfortable  HENT:  Head: Normocephalic and atraumatic.  Eyes: Conjunctivae are normal. Pupils are equal, round, and reactive to light.  Neck: Neck supple. No tracheal deviation present. No thyromegaly present.  Cardiovascular: Normal rate and regular rhythm.   No murmur heard. Pulmonary/Chest: Effort normal and breath sounds normal.  Abdominal: Soft. Bowel sounds are normal. He exhibits no  distension. There is no tenderness.  Obese  Musculoskeletal: Normal range of motion. He exhibits edema. He exhibits no tenderness.  1+ pretibial pitting l edema bilaterally  Neurological: He is alert. Coordination normal.  Skin: Skin is warm and dry. No rash noted.  Psychiatric: He has a normal mood and affect.  Nursing note and vitals reviewed.   ED Course  Procedures (including critical care time) Labs Review Labs Reviewed  COMPREHENSIVE METABOLIC PANEL - Abnormal; Notable for the following:    BUN 26 (*)    Creatinine, Ser 1.67 (*)    Calcium 7.5 (*)    ALT 14 (*)    GFR calc non Af Amer 38 (*)    GFR calc Af Amer 44 (*)    All other components within normal limits  CBC WITH DIFFERENTIAL/PLATELET - Abnormal; Notable for the following:    RBC 3.95 (*)    Hemoglobin 12.9 (*)    HCT 38.8 (*)    All other components within normal limits  URINALYSIS, ROUTINE W REFLEX MICROSCOPIC (NOT AT [facility]) - Abnormal; Notable for the following:    Color, Urine AMBER (*)    Specific Gravity, Urine 1.035 (*)    Bilirubin Urine SMALL (*)    Ketones, ur >80 (*)    Protein, ur 30 (*)     Leukocytes, UA TRACE (*)    All other components within normal limits  BLOOD GAS, VENOUS - Abnormal; Notable for the following:    pH, Ven 7.512 (*)    pCO2, Ven 28.8 (*)    pO2, Ven 105.0 (*)    All other components within normal limits  URINE MICROSCOPIC-ADD ON - Abnormal; Notable for the following:    Squamous Epithelial / LPF 0-5 (*)    Bacteria, UA RARE (*)    All other components within normal limits  URINE CULTURE  LIPASE, BLOOD  TROPONIN I  I-STAT CG4 LACTIC ACID, ED  I-STAT CG4 LACTIC ACID, ED    Imaging Review Dg Chest 2 View  02/23/2016  CLINICAL DATA:  Shortness of breath. History of renal cell carcinoma EXAM: CHEST  2 VIEW COMPARISON:  February 15, 2016 FINDINGS: There is no edema or consolidation. Heart is mildly enlarged with pulmonary vascularity within normal limits. No adenopathy. There is evidence of a total shoulder replacement on the right. There is degenerative change in each shoulder as well as in the thoracic spine. IMPRESSION: Stable cardiac prominence.  No edema or consolidation. Electronically Signed   By: Lowella Grip III M.D.   On: 02/23/2016 15:24   I have personally reviewed and evaluated these images and lab results as part of my medical decision-making.   EKG Interpretation   Date/Time:  Friday Feb 23 2016 14:27:00 EDT Ventricular Rate:  71 PR Interval:  191 QRS Duration: 89 QT Interval:  411 QTC Calculation: 447 R Axis:   -18 Text Interpretation:  Sinus rhythm Borderline left axis deviation Low  voltage, precordial leads Probable anteroseptal infarct, old Baseline  wander in lead(s) V6 No significant change since last tracing Confirmed by  KNOTT MD, DANIEL [id number]) on 02/23/2016 2:37:42 PM     7 PM patient appears in no respiratory distress. He does appear mildly anxious Results for orders placed or performed during the hospital encounter of 02/23/16  Comprehensive metabolic panel  Result Value Ref Range   Sodium 141 135 - 145 mmol/L    Potassium 4.1 3.5 - 5.1 mmol/L   Chloride 104 101 - 111 mmol/L  CO2 25 22 - 32 mmol/L   Glucose, Bld 96 65 - 99 mg/dL   BUN 26 (H) 6 - 20 mg/dL   Creatinine, Ser 1.67 (H) 0.61 - 1.24 mg/dL   Calcium 7.5 (L) 8.9 - 10.3 mg/dL   Total Protein 7.1 6.5 - 8.1 g/dL   Albumin 3.9 3.5 - 5.0 g/dL   AST 25 15 - 41 U/L   ALT 14 (L) 17 - 63 U/L   Alkaline Phosphatase 67 38 - 126 U/L   Total Bilirubin 1.1 0.3 - 1.2 mg/dL   GFR calc non Af Amer 38 (L) >60 mL/min   GFR calc Af Amer 44 (L) >60 mL/min   Anion gap 12 5 - 15  CBC WITH DIFFERENTIAL  Result Value Ref Range   WBC 8.4 4.0 - 10.5 K/uL   RBC 3.95 (L) 4.22 - 5.81 MIL/uL   Hemoglobin 12.9 (L) 13.0 - 17.0 g/dL   HCT 38.8 (L) 39.0 - 52.0 %   MCV 98.2 78.0 - 100.0 fL   MCH 32.7 26.0 - 34.0 pg   MCHC 33.2 30.0 - 36.0 g/dL   RDW 13.8 11.5 - 15.5 %   Platelets 244 150 - 400 K/uL   Neutrophils Relative % 80 %   Neutro Abs 6.7 1.7 - 7.7 K/uL   Lymphocytes Relative 11 %   Lymphs Abs 1.0 0.7 - 4.0 K/uL   Monocytes Relative 8 %   Monocytes Absolute 0.7 0.1 - 1.0 K/uL   Eosinophils Relative 1 %   Eosinophils Absolute 0.1 0.0 - 0.7 K/uL   Basophils Relative 0 %   Basophils Absolute 0.0 0.0 - 0.1 K/uL  Urinalysis, Routine w reflex microscopic (not at [facility])  Result Value Ref Range   Color, Urine AMBER (A) YELLOW   APPearance CLEAR CLEAR   Specific Gravity, Urine 1.035 (H) 1.005 - 1.030   pH 6.5 5.0 - 8.0   Glucose, UA NEGATIVE NEGATIVE mg/dL   Hgb urine dipstick NEGATIVE NEGATIVE   Bilirubin Urine SMALL (A) NEGATIVE   Ketones, ur >80 (A) NEGATIVE mg/dL   Protein, ur 30 (A) NEGATIVE mg/dL   Nitrite NEGATIVE NEGATIVE   Leukocytes, UA TRACE (A) NEGATIVE  Blood gas, venous  Result Value Ref Range   pH, Ven 7.512 (H) 7.250 - 7.300   pCO2, Ven 28.8 (L) 45.0 - 50.0 mmHg   pO2, Ven 105.0 (H) 31.0 - 45.0 mmHg   Bicarbonate 22.9 20.0 - 24.0 mEq/L   TCO2 19.8 0 - 100 mmol/L   Acid-Base Excess 1.3 0.0 - 2.0 mmol/L   O2 Saturation 98.0 %    Patient temperature 98.6    Collection site VEIN    Drawn by COLLECTED BY LABORATORY    Sample type VEIN   Lipase, blood  Result Value Ref Range   Lipase 27 11 - 51 U/L  Troponin I  Result Value Ref Range   Troponin I <0.03 <0.031 ng/mL  Urine microscopic-add on  Result Value Ref Range   Squamous Epithelial / LPF 0-5 (A) NONE SEEN   WBC, UA 0-5 0 - 5 WBC/hpf   RBC / HPF 0-5 0 - 5 RBC/hpf   Bacteria, UA RARE (A) NONE SEEN   Urine-Other MUCOUS PRESENT   I-Stat CG4 Lactic Acid, ED  (not at  [facility])  Result Value Ref Range   Lactic Acid, Venous 1.68 0.5 - 2.0 mmol/L   Dg Chest 2 View  02/23/2016  CLINICAL DATA:  Shortness of breath. History of renal cell  carcinoma EXAM: CHEST  2 VIEW COMPARISON:  February 15, 2016 FINDINGS: There is no edema or consolidation. Heart is mildly enlarged with pulmonary vascularity within normal limits. No adenopathy. There is evidence of a total shoulder replacement on the right. There is degenerative change in each shoulder as well as in the thoracic spine. IMPRESSION: Stable cardiac prominence.  No edema or consolidation. Electronically Signed   By: Lowella Grip III M.D.   On: 02/23/2016 15:24   Dg Chest 2 View  02/15/2016  CLINICAL DATA:  Increasing shortness of breath today. EXAM: CHEST  2 VIEW COMPARISON:  September 26, 2015 FINDINGS: The heart size and mediastinal contours are stable. The heart size is enlarged. There is no focal infiltrate, pulmonary edema, or pleural effusion. The visualized skeletal structures are stable. IMPRESSION: No active cardiopulmonary disease. Electronically Signed   By: Abelardo Diesel M.D.   On: 02/15/2016 20:07   Ct Angio Chest Pe W/cm &/or Wo Cm  02/23/2016  CLINICAL DATA:  Shortness of breath and chest pain EXAM: CT ANGIOGRAPHY CHEST WITH CONTRAST TECHNIQUE: Multidetector CT imaging of the chest was performed using the standard protocol during bolus administration of intravenous contrast. Multiplanar CT image reconstructions and  MIPs were obtained to evaluate the vascular anatomy. CONTRAST:  100 mL Isovue 370 nonionic COMPARISON:  Chest CT January 16, 2012; chest radiograph Feb 23, 2016 FINDINGS: Mediastinum/Lymph Nodes: There is no demonstrable pulmonary embolus. There is prominence of the ascending thoracic aorta with a maximum transverse diameter of 4.3 x 4.0 cm. There is no thoracic aortic dissection. The visualized great vessels appear unremarkable. There is minimal pericardial effusion adjacent to the left ventricle superiorly. There are scattered foci of coronary artery calcification. There is no appreciable thoracic adenopathy. Lungs/Pleura: There is airspace consolidation in the lung bases bilaterally, primarily in the posterior and lateral segments of the left lower lobe and in the lateral segment of the right lower lobe. Mild atelectasis is also noted in the lung bases. Upper abdomen: Visualized upper abdominal structures appear unremarkable. Musculoskeletal: There is degenerative change in the thoracic spine noted. There are no blastic or lytic bone lesions. There is a total shoulder replacement on the right with artifact from this metallic prosthesis. Review of the MIP images confirms the above findings. IMPRESSION: No demonstrable pulmonary embolus. Bibasilar pneumonia, slightly more on the left than on the right. Prominence of the ascending thoracic aorta with a maximum transverse diameter of 4.3 x 4.0 cm. Recommend annual imaging followup by CTA or MRA. This recommendation follows 2010 ACCF/AHA/AATS/ACR/ASA/SCA/SCAI/SIR/STS/SVM Guidelines for the Diagnosis and Management of Patients with Thoracic Aortic Disease. Circulation. 2010; [id number]. No aortic dissection seen. No appreciable thoracic adenopathy. Electronically Signed   By: Lowella Grip III M.D.   On: 02/23/2016 18:02   Ct Abdomen Pelvis W Contrast  02/15/2016  CLINICAL DATA:  Acute onset of generalized abdominal pain and testicular pain. Initial encounter.  EXAM: CT ABDOMEN AND PELVIS WITH CONTRAST TECHNIQUE: Multidetector CT imaging of the abdomen and pelvis was performed using the standard protocol following bolus administration of intravenous contrast. CONTRAST:  188mL ISOVUE-300 IOPAMIDOL (ISOVUE-300) INJECTION 61% COMPARISON:  CT abdomen and pelvis from 07/10/2015 FINDINGS: There is mild somewhat nodular partial consolidation at both lower lung lobes, which may reflect an atypical infectious process. Scattered coronary artery calcifications are seen. The liver and spleen are unremarkable in appearance. The patient is status post cholecystectomy, with clips noted at the gallbladder fossa. The pancreas and adrenal glands are unremarkable. The mass at  the lower pole of the left kidney has increased mildly in size since the prior study, measuring approximately 3.4 x 3.3 x 2.7 cm. Changes of prior cryoablation are noted, with surrounding calcification and chronic soft tissue inflammation. Nonspecific perinephric stranding is noted bilaterally. An additional 4 mm stone is noted at the interpole region of the left kidney. There is no evidence of hydronephrosis. No obstructing ureteral stones are identified. No free fluid is identified. The small bowel is unremarkable in appearance. The stomach is within normal limits. No acute vascular abnormalities are seen. Minimal calcification is noted along the left common iliac artery and its branches. The appendix is normal in caliber and contains air, without evidence of appendicitis. Minimal diverticulosis is noted along the sigmoid colon, without evidence of diverticulitis. The bladder is mildly distended and grossly unremarkable. The prostate remains normal in size. No inguinal lymphadenopathy is seen. No acute osseous abnormalities are identified. Multilevel vacuum phenomenon is noted along the lumbar spine, with endplate sclerotic change. IMPRESSION: 1. Mild somewhat nodular partial consolidation at the lung bases, which may  reflect an atypical infectious process. Would correlate for any associated symptoms. 2. Previously cryoablated mass at the lower pole of the left kidney has increased mildly in size, measuring approximately 3.4 x 3.3 x 2.7 cm, compared to 3.1 x 3.0 x 2.7 cm on the prior study. This raises concern for persistent malignancy. Further evaluation is recommended. 3. Scattered coronary artery calcifications seen. 4. 4 mm nonobstructing stone at the interpole region of the left kidney. Mild calcification noted adjacent to the mass at the lower pole of the left kidney. 5. Mild degenerative change along the lumbar spine. 6. Minimal diverticulosis along the sigmoid colon, without evidence of diverticulitis. Electronically Signed   By: Garald Balding M.D.   On: 02/15/2016 22:13    MDM  CT scan discussed with Dr. Jasmine December. Pneumonia appears worse from prior CT scan. Will treat for community-acquired pneumonia. Blood cultures intravenous antibiotics Final diagnoses:  Chest wall pain  Shortness of breath  Dr Hillery Hunter for inpatient stay and will see patient in ED  Dx #1 Community Acquired Pneumonia #2 renal insufficiency    Orlie Dakin, MD 02/23/16 1907

## 2016-02-23 NOTE — ED Provider Notes (Signed)
CSN: [id number]     Arrival date & time 02/23/16  1328 History   First MD Initiated Contact with Patient 02/23/16 1351     Chief Complaint  Patient presents with  . Cough     (Consider location/radiation/quality/duration/timing/severity/associated sxs/prior Treatment) Patient is a 75 y.o. male presenting with shortness of breath. The history is provided by the patient.  Shortness of Breath Severity:  Moderate Onset quality:  Gradual Duration:  1 week Timing:  Constant Progression:  Unchanged Chronicity:  New Context: URI (recently treated for possible pneumonia)   Relieved by: oxygen. Worsened by:  Exertion and stress Ineffective treatments:  Rest (antibiotic) Associated symptoms: abdominal pain (epigastric), chest pain (bilateral lateral pain), cough (occasional) and fever (supjective)   Associated symptoms: no sputum production, no syncope, no vomiting and no wheezing     Past Medical History  Diagnosis Date  . Other congenital hamartoses, not elsewhere classified   . Family history of ischemic heart disease   . Family history of malignant neoplasm of gastrointestinal tract   . Allergic rhinitis, cause unspecified   . Other and unspecified hyperlipidemia   . Unspecified essential hypertension   . Obesity hypoventilation syndrome ([place])     failed cpap/bipap trial  . Hypothyroidism   . GERD (gastroesophageal reflux disease)   . Arthritis     osteoarthritis  . DEMENTIA     Alzheimer's, senile dementia  . Shortness of breath   . Cancer ([place])     renal carcinoma  . Sleep apnea     OXYGEN 24 HR/DAY-NO CPAP  . COPD (chronic obstructive pulmonary disease) (HCC)     [name] FOLLOWS PT--PT USING OXYGEN 24 HRS A DAY  . COPD (chronic obstructive pulmonary disease) (HCC)     PULMONARY CLEARANCE FOR LEFT RENAL CRYOABLATION ON CHART FROM DR. CLANCE -07/26/11  . Coronary artery disease     CARDIAC CLEARANCE  OFFICE NOTE FOR LEFT CRYO ABLATION ON CHART FROM [name]  -"RECENT STRESS TEST DID NOT SHOW ISCHEMIA" -"PRESUMED CAD DUE TO CORONARY CALCIFICATIONS"   . Myocardial infarction ([place])   . Pneumonia     HX      Past Surgical History  Procedure Laterality Date  . Eye surgery      bilateral cataracts  . Joint replacement      bilateral knee replacements  . Carpal tunnel release      right  . Thyroidectomy    . Cholecystectomy  2009  . Knee arthroplasty      2005 LEFT   AND 2007 RIGHT--[name]  . Tumor removal  09/14/2011   Family History  Problem Relation Age of Onset  . Cancer Other     colon  . Heart disease Other     CAD, MI and Heart Attack   Social History  Substance Use Topics  . Smoking status: Never Smoker   . Smokeless tobacco: Never Used  . Alcohol Use: No    Review of Systems  Constitutional: Positive for fever (supjective).  Respiratory: Positive for cough (occasional) and shortness of breath. Negative for sputum production and wheezing.   Cardiovascular: Positive for chest pain (bilateral lateral pain). Negative for syncope.  Gastrointestinal: Positive for abdominal pain (epigastric). Negative for vomiting.  All other systems reviewed and are negative.     Allergies  Review of patient's allergies indicates no known allergies.  Home Medications   Prior to Admission medications   Medication Sig Start Date End Date Taking? Authorizing Provider  acetaminophen (  TYLENOL) 500 MG tablet Take 1,000 mg by mouth 2 (two) times daily.   Yes Historical Provider, MD  albuterol (PROVENTIL) (2.5 MG/3ML) 0.083% nebulizer solution Take 2.5 mg by nebulization every 6 (six) hours as needed for wheezing or shortness of breath.   Yes Historical Provider, MD  calcitRIOL (ROCALTROL) 0.5 MCG capsule Take 0.5 mcg by mouth daily.   Yes Historical Provider, MD  calcium carbonate (TUMS - DOSED IN MG ELEMENTAL CALCIUM) 500 MG chewable tablet Chew 2 tablets by mouth daily as needed for indigestion or heartburn.   Yes Historical Provider, MD    carboxymethylcellulose 1 % ophthalmic solution Place 1 drop into both eyes 2 (two) times daily.   Yes Historical Provider, MD  clopidogrel (PLAVIX) 75 MG tablet Take 75 mg by mouth every morning.    Yes Historical Provider, MD  cyanocobalamin 500 MCG tablet Take 500 mcg by mouth daily.   Yes Historical Provider, MD  cycloSPORINE (RESTASIS) 0.05 % ophthalmic emulsion Place 1 drop into both eyes 2 (two) times daily.    Yes Historical Provider, MD  Dextromethorphan-Guaifenesin (DIABETIC TUSSIN DM MAX ST PO) Take 5 mLs by mouth 2 (two) times daily as needed (for cough).   Yes Historical Provider, MD  docusate sodium (COLACE) 100 MG capsule Take 100 mg by mouth 2 (two) times daily as needed for mild constipation or moderate constipation.   Yes Historical Provider, MD  fluticasone (FLONASE) 50 MCG/ACT nasal spray Place 1 spray into the nose daily as needed for allergies.    Yes Historical Provider, MD  HYDROcodone-acetaminophen (NORCO/VICODIN) 5-325 MG per tablet Take 0.5 tablets by mouth 2 (two) times daily as needed for moderate pain.   Yes Historical Provider, MD  ipratropium (ATROVENT) 0.02 % nebulizer solution Take 0.5 mg by nebulization every 6 (six) hours as needed for wheezing or shortness of breath.   Yes Historical Provider, MD  levofloxacin (LEVAQUIN) 500 MG tablet Take 1 tablet (500 mg total) by mouth daily. Patient taking differently: Take 500 mg by mouth daily. Started 04/28 for 7 days 02/15/16  Yes Isla Pence, MD  levothyroxine (SYNTHROID, LEVOTHROID) 200 MCG tablet Take 200 mcg by mouth daily.    Yes Historical Provider, MD  levothyroxine (SYNTHROID, LEVOTHROID) 25 MCG tablet Take 25 mcg by mouth daily before breakfast.   Yes Historical Provider, MD  metoprolol tartrate (LOPRESSOR) 25 MG tablet Take 12.5 mg by mouth 2 (two) times daily. Hold for systolic blood pressure [id number]   Yes Historical Provider, MD  mometasone-formoterol (DULERA) 100-5 MCG/ACT AERO Inhale 2 puffs into the lungs 2  (two) times daily.   Yes Historical Provider, MD  nystatin cream (MYCOSTATIN) Apply topically 2 (two) times daily. Patient taking differently: Apply 1 application topically 2 (two) times daily.  02/15/16  Yes Isla Pence, MD  OXYGEN Inhale into the lungs at bedtime.   Yes Historical Provider, MD  polyethylene glycol powder (GLYCOLAX/MIRALAX) powder Take 1 Container by mouth 2 (two) times daily. Mix 1 capful (17g) in 8 ounces of fluid and drink   Yes Historical Provider, MD  Tamsulosin HCl (FLOMAX) 0.4 MG CAPS Take 0.4 mg by mouth daily. Hold if SBP < 100   Yes Historical Provider, MD  traMADol (ULTRAM) 50 MG tablet Take 100 mg by mouth 2 (two) times daily.   Yes Historical Provider, MD  traZODone (DESYREL) 150 MG tablet Take 300 mg by mouth at bedtime.   Yes Historical Provider, MD   BP 117/66 mmHg  Pulse 78  Temp(Src) 99.4  F (37.4 C) (Rectal)  Resp 30  SpO2 96% Physical Exam  Constitutional: He is oriented to person, place, and time. He appears well-developed and well-nourished. No distress.  HENT:  Head: Normocephalic and atraumatic.  Eyes: Conjunctivae are normal.  Neck: Neck supple. No tracheal deviation present.  Cardiovascular: Normal rate and regular rhythm.   Pulmonary/Chest: Tachypnea (paroxysmal) noted. He has no decreased breath sounds. He has no wheezes. He has no rhonchi. He has no rales. He exhibits tenderness. He exhibits no crepitus, no deformity and no retraction.    Abdominal: Soft. He exhibits no distension.  Neurological: He is alert and oriented to person, place, and time.  Skin: Skin is warm and dry.  Psychiatric: He has a normal mood and affect.    ED Course  Procedures (including critical care time) Labs Review Labs Reviewed  COMPREHENSIVE METABOLIC PANEL - Abnormal; Notable for the following:    BUN 26 (*)    Creatinine, Ser 1.67 (*)    Calcium 7.5 (*)    ALT 14 (*)    GFR calc non Af Amer 38 (*)    GFR calc Af Amer 44 (*)    All other components  within normal limits  CBC WITH DIFFERENTIAL/PLATELET - Abnormal; Notable for the following:    RBC 3.95 (*)    Hemoglobin 12.9 (*)    HCT 38.8 (*)    All other components within normal limits  URINALYSIS, ROUTINE W REFLEX MICROSCOPIC (NOT AT [facility]) - Abnormal; Notable for the following:    Color, Urine AMBER (*)    Specific Gravity, Urine 1.035 (*)    Bilirubin Urine SMALL (*)    Ketones, ur >80 (*)    Protein, ur 30 (*)    Leukocytes, UA TRACE (*)    All other components within normal limits  BLOOD GAS, VENOUS - Abnormal; Notable for the following:    pH, Ven 7.512 (*)    pCO2, Ven 28.8 (*)    pO2, Ven 105.0 (*)    All other components within normal limits  URINE MICROSCOPIC-ADD ON - Abnormal; Notable for the following:    Squamous Epithelial / LPF 0-5 (*)    Bacteria, UA RARE (*)    All other components within normal limits  URINE CULTURE  LIPASE, BLOOD  TROPONIN I  I-STAT CG4 LACTIC ACID, ED  I-STAT CG4 LACTIC ACID, ED    Imaging Review Dg Chest 2 View  02/23/2016  CLINICAL DATA:  Shortness of breath. History of renal cell carcinoma EXAM: CHEST  2 VIEW COMPARISON:  February 15, 2016 FINDINGS: There is no edema or consolidation. Heart is mildly enlarged with pulmonary vascularity within normal limits. No adenopathy. There is evidence of a total shoulder replacement on the right. There is degenerative change in each shoulder as well as in the thoracic spine. IMPRESSION: Stable cardiac prominence.  No edema or consolidation. Electronically Signed   By: Lowella Grip III M.D.   On: 02/23/2016 15:24   I have personally reviewed and evaluated these images and lab results as part of my medical decision-making.   EKG Interpretation   Date/Time:  Friday Feb 23 2016 14:27:00 EDT Ventricular Rate:  71 PR Interval:  191 QRS Duration: 89 QT Interval:  411 QTC Calculation: 447 R Axis:   -18 Text Interpretation:  Sinus rhythm Borderline left axis deviation Low  voltage, precordial  leads Probable anteroseptal infarct, old Baseline  wander in lead(s) V6 No significant change since last tracing Confirmed by  Najmo Pardue  MD, Quillian Quince [phone]) on 02/23/2016 2:37:42 PM      MDM   Final diagnoses:  Chest wall pain  Shortness of breath   75 y.o. male presents with shortness of breath and pain of bilateral lateral chest wall abnd apigastrium. EKG interpreted by me without ST or T wave changes concerning for myocardial ischemia. No delta wave, no prolonged QTc, no brugada to suggest arrhythmogenicity.  He states he recently had pneumonia and has been having chills and fevers at home. AFVSS here. Asking for oxygen but not hypoxemic. Pain atypical for cardiac etiology. Appears mildly dehydrated possibly from insensible losses and has some slight increase in Cr so will provide 2L of fluid boluses. Pain continues and with dyspnea that he states is a new occurence and no evidence of airspace disease on CXR will pursue CT tro r/o PE as possible etiology and add lipase for completion of abdominal workup.   Care transferred to Dr Winfred Leeds pending results of CT.   Leo Grosser, MD 02/23/16 1739

## 2016-02-24 ENCOUNTER — Inpatient Hospital Stay (HOSPITAL_COMMUNITY): Payer: Medicare Other

## 2016-02-24 DIAGNOSIS — R0600 Dyspnea, unspecified: Secondary | ICD-10-CM

## 2016-02-24 LAB — CREATININE, URINE, RANDOM: Creatinine, Urine: 84.52 mg/dL

## 2016-02-24 LAB — COMPREHENSIVE METABOLIC PANEL
ALT: 12 U/L — AB (ref 17–63)
AST: 21 U/L (ref 15–41)
Albumin: 3.2 g/dL — ABNORMAL LOW (ref 3.5–5.0)
Alkaline Phosphatase: 56 U/L (ref 38–126)
Anion gap: 8 (ref 5–15)
BILIRUBIN TOTAL: 0.7 mg/dL (ref 0.3–1.2)
BUN: 22 mg/dL — AB (ref 6–20)
CALCIUM: 6.6 mg/dL — AB (ref 8.9–10.3)
CO2: 28 mmol/L (ref 22–32)
CREATININE: 1.35 mg/dL — AB (ref 0.61–1.24)
Chloride: 105 mmol/L (ref 101–111)
GFR calc Af Amer: 57 mL/min — ABNORMAL LOW (ref >60)
GFR, EST NON AFRICAN AMERICAN: 49 mL/min — AB (ref >60)
Glucose, Bld: 89 mg/dL (ref 65–99)
POTASSIUM: 4.6 mmol/L (ref 3.5–5.1)
Sodium: 141 mmol/L (ref 135–145)
TOTAL PROTEIN: 5.9 g/dL — AB (ref 6.5–8.1)

## 2016-02-24 LAB — CBC
HCT: 35 % — ABNORMAL LOW (ref 39.0–52.0)
Hemoglobin: 11.4 g/dL — ABNORMAL LOW (ref 13.0–17.0)
MCH: 32.4 pg (ref 26.0–34.0)
MCHC: 32.6 g/dL (ref 30.0–36.0)
MCV: 99.4 fL (ref 78.0–100.0)
PLATELETS: 190 10*3/uL (ref 150–400)
RBC: 3.52 MIL/uL — ABNORMAL LOW (ref 4.22–5.81)
RDW: 14.1 % (ref 11.5–15.5)
WBC: 6.7 10*3/uL (ref 4.0–10.5)

## 2016-02-24 LAB — TROPONIN I

## 2016-02-24 LAB — ECHOCARDIOGRAM COMPLETE
Height: 76 in
WEIGHTICAEL: 4408 [oz_av]

## 2016-02-24 LAB — SODIUM, URINE, RANDOM: Sodium, Ur: 147 mmol/L

## 2016-02-24 LAB — PHOSPHORUS: PHOSPHORUS: 5.2 mg/dL — AB (ref 2.5–4.6)

## 2016-02-24 LAB — STREP PNEUMONIAE URINARY ANTIGEN: Strep Pneumo Urinary Antigen: NEGATIVE

## 2016-02-24 LAB — TSH: TSH: 3.211 u[IU]/mL (ref 0.350–4.500)

## 2016-02-24 LAB — MAGNESIUM: Magnesium: 1.9 mg/dL (ref 1.7–2.4)

## 2016-02-24 MED ORDER — HYDROMORPHONE HCL 1 MG/ML IJ SOLN
1.0000 mg | Freq: Once | INTRAMUSCULAR | Status: AC
  Administered 2016-02-24: 1 mg via INTRAVENOUS
  Filled 2016-02-24: qty 1

## 2016-02-24 MED ORDER — ALPRAZOLAM 0.25 MG PO TABS
0.2500 mg | ORAL_TABLET | Freq: Two times a day (BID) | ORAL | Status: DC | PRN
  Administered 2016-02-24 – 2016-02-26 (×5): 0.25 mg via ORAL
  Filled 2016-02-24 (×5): qty 1

## 2016-02-24 MED ORDER — NYSTATIN 100000 UNIT/GM EX POWD
Freq: Two times a day (BID) | CUTANEOUS | Status: DC
  Administered 2016-02-24 – 2016-02-27 (×7): via TOPICAL
  Filled 2016-02-24: qty 15

## 2016-02-24 NOTE — Progress Notes (Signed)
PROGRESS NOTE    [name] [name]  [id number] DOB: 01-23-1941 DOA: 02/23/2016 PCP: Reymundo Poll, MD Outpatient Specialists: Brief Narrative: [name] [name] is a 75 y.o. male with medical history significant of dementia, COPD, coronary artery disease, ischemic heart disease renal cell carcinoma OSA on oxygen as needed. Presented with a one-week history of epigastric abdominal pain associated is some bilateral lateral chest pain coughing, he has been having rapid respirations. subjective feeing a cough nonproductive or white sputum and not associated with nausea vomiting no diarrhea. This is been associated with increased anxiety tremor hyperventilating. He has been seen about 2 weeks ago and was told he has pneumonia started on Levaquin. CTA chest, no PE, bibasilar infiltrates  Assessment & Plan:   Community acquired pneumonia -continue Rocephin and azithromycin -completed a course of Levaquin 1 week ago -FU cultures -check SLP eval to r/o aspiration  . COPD (chronic obstructive pulmonary disease)  -currently stable, nebs PRN  . 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 Dementia  -chronic currently stable  . Essential hypertension  - mild not on home medications.   . OSA (obstructive sleep apnea) - not on C Pap continue oxygen only as needed at night   DVT prophylaxis: Lovenox   Code Status: FULL CODE as per patient  Family Communication: Family not at Bedside   Disposition Plan:   Back to current facility when  stable   Antimicrobials:ROcephin/Zithromax  Subjective: Feels ok, some cough, has chronic erythema of both feet  Objective: Filed Vitals:   02/23/16 2121 02/24/16 0233 02/24/16 0601 02/24/16 0922  BP:  100/50 124/67 103/74  Pulse:  56 63 68  Temp:  97.7 F (36.5 C) 97.8 F (36.6 C)   TempSrc:  Oral Oral   Resp:  18 18   Height: 6\' 4"  (1.93 m)     Weight: 124.966 kg (275 lb 8 oz)     SpO2:  100% 100%     Intake/Output Summary (Last 24 hours) at 02/24/16 1110 Last data filed at 02/24/16 0950  Gross per 24 hour  Intake 2776.67 ml  Output   1200 ml  Net 1576.67 ml   Filed Weights   02/23/16 2121  Weight: 124.966 kg (275 lb 8 oz)    Examination:  General exam: Appears calm and comfortable, no distress, AAOx3 Respiratory system: few basilar ronchi Cardiovascular system: S1 & S2 heard, RRR. No JVD, murmurs, rubs, gallops or clicks. Gastrointestinal system: Abdomen is nondistended, soft and nontender. No organomegaly or masses felt. Normal bowel sounds Central nervous system: Alert and oriented. No focal neurological deficits. Extremities: Symmetric 5 x 5 power, erythema and mild swelling on both lower legs with venous stasis changes Skin: erythema-both legs Psychiatry: Judgement and insight appear normal. Mood & affect appropriate.     Data Reviewed: I have personally reviewed following labs and imaging studies  CBC:  Recent Labs Lab 02/23/16 1422 02/24/16 0359  WBC 8.4 6.7  NEUTROABS 6.7  --   HGB 12.9* 11.4*  HCT 38.8* 35.0*  MCV 98.2 99.4  PLT 244 [id number]   Basic Metabolic Panel:  Recent Labs Lab 02/23/16 1422 02/24/16 0359  NA 141 141  K 4.1 4.6  CL 104 105  CO2 25 28  GLUCOSE 96 89  BUN 26* 22*  CREATININE 1.67* 1.35*  CALCIUM 7.5* 6.6*  MG  --  1.9  PHOS  --  5.2*   GFR: Estimated Creatinine Clearance: 67.2 mL/min (by C-G formula based on Cr of 1.35). Liver Function Tests:  Recent Labs Lab 02/23/16 1422 02/24/16 0359  AST 25 21  ALT  14* 12*  ALKPHOS 67 56  BILITOT 1.1 0.7  PROT 7.1 5.9*  ALBUMIN 3.9 3.2*    Recent Labs Lab 02/23/16 1533  LIPASE 27   No results for input(s): AMMONIA in the last 168 hours. Coagulation Profile: No results for input(s): INR, PROTIME in the last 168 hours. Cardiac Enzymes:  Recent Labs Lab 02/23/16 1549 02/23/16 2151 02/24/16 0359  TROPONINI <0.03 <0.03 <0.03   BNP (last 3 results) No results for input(s): PROBNP in the last 8760 hours. HbA1C: No results for input(s): HGBA1C in the last 72 hours. CBG: No results for input(s): GLUCAP in the last 168 hours. Lipid Profile: No results for input(s): CHOL, HDL, LDLCALC, TRIG, CHOLHDL, LDLDIRECT in the last 72 hours. Thyroid Function Tests:  Recent Labs  02/24/16 0359  TSH 3.211   Anemia Panel: No results for input(s): VITAMINB12, FOLATE, FERRITIN, TIBC, IRON, RETICCTPCT in the last 72 hours. Urine analysis:    Component Value Date/Time   COLORURINE AMBER* 02/23/2016 [place] 02/23/2016 1541   LABSPEC 1.035* 02/23/2016 1541   PHURINE 6.5 02/23/2016 1541   GLUCOSEU NEGATIVE 02/23/2016 1541   [place] 02/23/2016 1541   BILIRUBINUR SMALL* 02/23/2016 1541   KETONESUR >80* 02/23/2016 1541   PROTEINUR 30* 02/23/2016 1541   UROBILINOGEN 0.2 07/10/2015 1630   NITRITE NEGATIVE 02/23/2016 1541   LEUKOCYTESUR TRACE* 02/23/2016 1541   Sepsis Labs: @LABRCNTIP (procalcitonin:4,lacticidven:4)  ) Recent Results (from the past 240 hour(s))  Culture, blood (Routine X 2) w Reflex to ID Panel     Status: None (Preliminary result)   Collection Time: 02/23/16  3:00 PM  Result Value Ref Range Status   Specimen Description BLOOD RIGHT FOREARM  Final   Special Requests BOTTLES DRAWN AEROBIC AND ANAEROBIC 5 CC  Final   Culture PENDING  Incomplete   Report Status PENDING  Incomplete  MRSA PCR Screening     Status: None   Collection Time: 02/23/16 10:09 PM  Result Value Ref Range Status   MRSA by PCR  NEGATIVE NEGATIVE Final    Comment:        The GeneXpert MRSA Assay (FDA approved for NASAL specimens only), is one component of a comprehensive MRSA colonization surveillance program. It is not intended to diagnose MRSA infection nor to guide or monitor treatment for MRSA infections.          Radiology Studies: Dg Chest 2 View  02/23/2016  CLINICAL DATA:  Shortness of breath. History of renal cell carcinoma EXAM: CHEST  2 VIEW COMPARISON:  February 15, 2016 FINDINGS: There is no edema or consolidation. Heart is mildly enlarged with pulmonary vascularity within normal limits. No adenopathy. There is evidence of a total shoulder replacement on the right. There is degenerative change in each shoulder as well as in the thoracic spine. IMPRESSION: Stable cardiac prominence.  No edema or consolidation. Electronically Signed   By: Lowella Grip III M.D.   On: 02/23/2016 15:24   Ct Angio Chest Pe W/cm &/or Wo  Cm  02/23/2016  CLINICAL DATA:  Shortness of breath and chest pain EXAM: CT ANGIOGRAPHY CHEST WITH CONTRAST TECHNIQUE: Multidetector CT imaging of the chest was performed using the standard protocol during bolus administration of intravenous contrast. Multiplanar CT image reconstructions and MIPs were obtained to evaluate the vascular anatomy. CONTRAST:  100 mL Isovue 370 nonionic COMPARISON:  Chest CT January 16, 2012; chest radiograph Feb 23, 2016 FINDINGS: Mediastinum/Lymph Nodes: There is no demonstrable pulmonary embolus. There is prominence of the ascending thoracic aorta with a maximum transverse diameter of 4.3 x 4.0 cm. There is no thoracic aortic dissection. The visualized great vessels appear unremarkable. There is minimal pericardial effusion adjacent to the left ventricle superiorly. There are scattered foci of coronary artery calcification. There is no appreciable thoracic adenopathy. Lungs/Pleura: There is airspace consolidation in the lung bases bilaterally, primarily in the  posterior and lateral segments of the left lower lobe and in the lateral segment of the right lower lobe. Mild atelectasis is also noted in the lung bases. Upper abdomen: Visualized upper abdominal structures appear unremarkable. Musculoskeletal: There is degenerative change in the thoracic spine noted. There are no blastic or lytic bone lesions. There is a total shoulder replacement on the right with artifact from this metallic prosthesis. Review of the MIP images confirms the above findings. IMPRESSION: No demonstrable pulmonary embolus. Bibasilar pneumonia, slightly more on the left than on the right. Prominence of the ascending thoracic aorta with a maximum transverse diameter of 4.3 x 4.0 cm. Recommend annual imaging followup by CTA or MRA. This recommendation follows 2010 ACCF/AHA/AATS/ACR/ASA/SCA/SCAI/SIR/STS/SVM Guidelines for the Diagnosis and Management of Patients with Thoracic Aortic Disease. Circulation. 2010; [id number]. No aortic dissection seen. No appreciable thoracic adenopathy. Electronically Signed   By: Lowella Grip III M.D.   On: 02/23/2016 18:02   US Abdomen Complete  02/24/2016  CLINICAL DATA:  One week history of abdominal pain and decreased urine output EXAM: ABDOMEN ULTRASOUND COMPLETE COMPARISON:  CT abdomen and pelvis February 15, 2016 and July 10, 2015 FINDINGS: Gallbladder:  Surgically absent. Common bile duct: Diameter: 6 mm. There is no intrahepatic, common hepatic, or common bile duct dilatation. Liver: No focal lesion identified.  Liver echogenicity is increased. IVC: No abnormality visualized in visualized regions. Portions of the inferior vena cava are obscured by gas. Pancreas: Essentially completely obscured by gas. Spleen: Size and appearance within normal limits. Right Kidney: Length: 12.0 cm. Echogenicity within normal limits. There is diffuse renal cortical thinning. No mass or hydronephrosis visualized. Left Kidney: Length: 0.9 cm. Echogenicity within normal  limits. There is diffuse renal cortical thinning. No hydronephrosis visualized. There is a complex mass arising from the lower pole left kidney measuring 5.5 x 4.5 x 6.1 cm. There is a nonobstructing 4 mm calculus in the upper to mid left kidney. Abdominal aorta: No aneurysm visualized. Other findings: No apparent ascites. IMPRESSION: Mass arising from the lower pole left kidney, complex. By report, the patient is undergone prior cryoablation in this area. In comparison with the prior CT, some of the complexity of the current lesion is due to scarring and fat surrounding a more centrally masslike area. It should be noted that a degree of recurrent neoplasm in this lesion cannot be excluded. Further assessment is felt to be warranted in this regard. Ideally, renal MR pre and post-contrast would be the imaging study of choice to further assess. Nonobstructing 4 mm calculus upper to mid left kidney. Gallbladder absent. Pancreas obscured by gas. Increased liver echogenicity, a  finding most likely indicative of hepatic steatosis. While no focal liver lesions are identified, it must be cautioned that the sensitivity of ultrasound for focal liver lesions is diminished in this circumstance. Renal cortical thinning bilaterally, a finding that may be indicative of a degree of underlying medical renal disease. Electronically Signed   By: Lowella Grip III M.D.   On: 02/24/2016 09:08        Scheduled Meds: . azithromycin  500 mg Intravenous Q24H  . calcitRIOL  0.5 mcg Oral Daily  . cefTRIAXone (ROCEPHIN)  IV  1 g Intravenous Q24H  . clopidogrel  75 mg Oral Daily  . cycloSPORINE  1 drop Both Eyes BID  . enoxaparin (LOVENOX) injection  40 mg Subcutaneous Q24H  . fluticasone  1 spray Each Nare Daily  . guaiFENesin  600 mg Oral BID  . ipratropium-albuterol  3 mL Nebulization BID  . levothyroxine  200 mcg Oral QAC breakfast  . levothyroxine  25 mcg Oral QAC breakfast  . metoprolol tartrate  12.5 mg Oral BID   . mometasone-formoterol  2 puff Inhalation BID  . sodium chloride flush  3 mL Intravenous Q12H  . tamsulosin  0.4 mg Oral Daily  . traMADol  100 mg Oral BID  . traZODone  300 mg Oral QHS   Continuous Infusions:    LOS: 1 day    Time spent: 71min    Domenic Polite, MD [facility] Pager [phone]  If 7PM-7AM, please contact night-coverage [email or web address] Password [email or web address] 02/24/2016, 11:10 AM

## 2016-02-24 NOTE — Progress Notes (Signed)
Echocardiogram 2D Echocardiogram has been performed.  [name] [name] 02/24/2016, 9:21 AM

## 2016-02-24 NOTE — Progress Notes (Signed)
Pt complaining of new onset 7 of 10 sharp pain in upper abdomen bilaterally. Unrelieved by Vicodin. VSS. Physician made aware. Will continue to monitor.

## 2016-02-24 NOTE — Evaluation (Signed)
Clinical/Bedside Swallow Evaluation Patient Details  Name: [name] [name] MRN: [id number] Date of Birth: May 29, 1941  Today's Date: 02/24/2016 Time: SLP Start Time (ACUTE ONLY): [id number] SLP Stop Time (ACUTE ONLY): 1731 SLP Time Calculation (min) (ACUTE ONLY): 19 min  Past Medical History:  Past Medical History  Diagnosis Date  . Other congenital hamartoses, not elsewhere classified   . Family history of ischemic heart disease   . Family history of malignant neoplasm of gastrointestinal tract   . Allergic rhinitis, cause unspecified   . Other and unspecified hyperlipidemia   . Unspecified essential hypertension   . Obesity hypoventilation syndrome ([place])     failed cpap/bipap trial  . Hypothyroidism   . GERD (gastroesophageal reflux disease)   . Arthritis     osteoarthritis  . DEMENTIA     Alzheimer's, senile dementia  . Shortness of breath   . Cancer ([place])     renal carcinoma  . Sleep apnea     OXYGEN 24 HR/DAY-NO CPAP  . COPD (chronic obstructive pulmonary disease) (HCC)     [name] FOLLOWS PT--PT USING OXYGEN 24 HRS A DAY  . COPD (chronic obstructive pulmonary disease) (HCC)     PULMONARY CLEARANCE FOR LEFT RENAL CRYOABLATION ON CHART FROM DR. CLANCE -07/26/11  . Coronary artery disease     CARDIAC CLEARANCE  OFFICE NOTE FOR LEFT CRYO ABLATION ON CHART FROM [name] -"RECENT STRESS TEST DID NOT SHOW ISCHEMIA" -"PRESUMED CAD DUE TO CORONARY CALCIFICATIONS"   . Myocardial infarction ([place])   . Pneumonia     HX      Past Surgical History:  Past Surgical History  Procedure Laterality Date  . Eye surgery      bilateral cataracts  . Joint replacement      bilateral knee replacements  . Carpal tunnel release      right  . Thyroidectomy    . Cholecystectomy  2009  . Knee arthroplasty      2005 LEFT   AND 2007 RIGHT--[name]  . Tumor removal  09/14/2011   HPI:  75 y.o. male with medical history significant for dementia, COPD, coronary artery disease, ischemic  heart disease renal cell carcinoma, OSA on oxygen as needed; CT chest indicated on 02/23/16 No demonstrable pulmonary embolus. Bibasilar pneumonia, slightly more on the left than on the right; BSE ordered to r/o aspiration.   Assessment / Plan / Recommendation Clinical Impression   Pt exhibited no overt s/s of aspiration with thin-solids during BSE; oropharyngeal swallow appeared normal, but BSE was affected by pt's pain level during assessment which ranged from a 5-7 with upper abdominal pain; nursing informed and pt treated with pain medication. ST will s/o at this time.    Aspiration Risk  No limitations    Diet Recommendation   Regular/thin  Medication Administration: Whole meds with liquid    Other  Recommendations Oral Care Recommendations: Oral care QID   Follow up Recommendations  None    Frequency and Duration   n/a         Prognosis Prognosis for Safe Diet Advancement: Good      Swallow Study   General Date of Onset: 02/23/16 HPI: 75 y.o. male with medical history significant of dementia, COPD, coronary artery disease, ischemic heart disease renal cell carcinoma OSA on oxygen as needed Type of Study: Bedside Swallow Evaluation Diet Prior to this Study: Regular;Thin liquids Temperature Spikes Noted: No Respiratory Status: Room air History of Recent Intubation: No Behavior/Cognition: Alert;Cooperative;Agitated Oral Cavity  Assessment: Within Functional Limits;Dry Oral Care Completed by SLP: No Oral Cavity - Dentition: Edentulous Vision: Functional for self-feeding Self-Feeding Abilities: Able to feed self Patient Positioning: Upright in chair Baseline Vocal Quality: Normal Volitional Cough: Strong Volitional Swallow: Able to elicit    Oral/Motor/Sensory Function Overall Oral Motor/Sensory Function: Within functional limits   Ice Chips Ice chips: Not tested   Thin Liquid Thin Liquid: Within functional limits Presentation: Cup;Straw    Nectar Thick Nectar Thick  Liquid: Not tested   Honey Thick Honey Thick Liquid: Not tested   Puree Puree: Within functional limits Presentation: Self Fed   Solid      Solid: Within functional limits Presentation: Self Fed        Anniebell Bedore,PAT, M.S., CCC-SLP 02/24/2016,5:57 PM

## 2016-02-24 NOTE — Progress Notes (Signed)
PVR showing <14 cc's of urine.

## 2016-02-25 LAB — CBC
HCT: 35.2 % — ABNORMAL LOW (ref 39.0–52.0)
Hemoglobin: 11.6 g/dL — ABNORMAL LOW (ref 13.0–17.0)
MCH: 31.4 pg (ref 26.0–34.0)
MCHC: 33 g/dL (ref 30.0–36.0)
MCV: 95.4 fL (ref 78.0–100.0)
PLATELETS: 191 10*3/uL (ref 150–400)
RBC: 3.69 MIL/uL — AB (ref 4.22–5.81)
RDW: 13.8 % (ref 11.5–15.5)
WBC: 6.8 10*3/uL (ref 4.0–10.5)

## 2016-02-25 LAB — URINE CULTURE

## 2016-02-25 LAB — BASIC METABOLIC PANEL
Anion gap: 11 (ref 5–15)
BUN: 21 mg/dL — ABNORMAL HIGH (ref 6–20)
CHLORIDE: 104 mmol/L (ref 101–111)
CO2: 25 mmol/L (ref 22–32)
Calcium: 6.8 mg/dL — ABNORMAL LOW (ref 8.9–10.3)
Creatinine, Ser: 1.26 mg/dL — ABNORMAL HIGH (ref 0.61–1.24)
GFR calc Af Amer: >60 mL/min (ref >60)
GFR calc non Af Amer: 54 mL/min — ABNORMAL LOW (ref >60)
GLUCOSE: 100 mg/dL — AB (ref 65–99)
POTASSIUM: 3.7 mmol/L (ref 3.5–5.1)
Sodium: 140 mmol/L (ref 135–145)

## 2016-02-25 MED ORDER — FUROSEMIDE 10 MG/ML IJ SOLN
20.0000 mg | Freq: Two times a day (BID) | INTRAMUSCULAR | Status: DC
  Administered 2016-02-25 – 2016-02-26 (×3): 20 mg via INTRAVENOUS
  Filled 2016-02-25 (×3): qty 2

## 2016-02-25 MED ORDER — POTASSIUM CHLORIDE CRYS ER 20 MEQ PO TBCR
40.0000 meq | EXTENDED_RELEASE_TABLET | Freq: Two times a day (BID) | ORAL | Status: DC
  Administered 2016-02-25 – 2016-02-26 (×3): 40 meq via ORAL
  Filled 2016-02-25 (×3): qty 2

## 2016-02-25 MED ORDER — NAPHAZOLINE-GLYCERIN 0.012-0.2 % OP SOLN
1.0000 [drp] | Freq: Four times a day (QID) | OPHTHALMIC | Status: DC | PRN
  Filled 2016-02-25: qty 15

## 2016-02-25 MED ORDER — MUSCLE RUB 10-15 % EX CREA
TOPICAL_CREAM | CUTANEOUS | Status: DC | PRN
  Administered 2016-02-25: 16:00:00 via TOPICAL
  Filled 2016-02-25: qty 85

## 2016-02-25 NOTE — Progress Notes (Signed)
PROGRESS NOTE    [name] [name]  [id number] DOB: [birth year] DOA: 02/23/2016 PCP: Reymundo Poll, MD Outpatient Specialists: Brief Narrative: [name] [name] is a 75 y.o. male with medical history significant of dementia, COPD, coronary artery disease, ischemic heart disease renal cell carcinoma OSA on oxygen as needed. Presented with a one-week history of epigastric abdominal pain associated is some bilateral lateral chest pain coughing, he has been having rapid respirations. subjective feeing a cough nonproductive or white sputum and not associated with nausea vomiting no diarrhea. This is been associated with increased anxiety tremor hyperventilating. He has been seen about 2 weeks ago and was told he has pneumonia started on Levaquin. CTA chest, no PE, bibasilar infiltrates. Improving on Abx, multiple somatic complaints   Assessment & Plan:   Community acquired pneumonia -continue Rocephin and azithromycin -completed a course of Levaquin 1 week prior to admission -Blood Cx negative, sputum pending -SLP eval completed, no s/s of ongoing aspiration -change to Oral Abx tomorrow  . COPD (chronic obstructive pulmonary disease)  -currently stable, nebs PRN  . 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 AKi on CKD3 -improving, stopped IVF  H/o Left Renal CA S/p Cryoablation in 11/12, needs FU -Renal US cannot r/o scarring vs mass, will need FU MRI and Urology FU  Ascending thoracic aortic aneurysm  -needs FU  . Dementia  -chronic currently stable  . Essential hypertension  - mild not on home medications.   . OSA (obstructive sleep apnea) - not on C Pap  continue oxygen only as needed at night   DVT prophylaxis: Lovenox   Code Status: FULL CODE as per patient  Family Communication: Family not at Bedside   Disposition Plan:   Back to current facility when stable   Antimicrobials:ROcephin/Zithromax  Subjective: mutliple complaints, cough, pain in sides, knees hurt, appetite poor, sleep poor  Objective: Filed Vitals:   02/25/16 0529 02/25/16 0833 02/25/16 0840 02/25/16 0928  BP: 110/54   120/60  Pulse: 72   83  Temp: 97.8 F (36.6 C)     TempSrc: Oral     Resp: 20     Height:      Weight:      SpO2: 98% 94% 94%     Intake/Output Summary (Last 24 hours) at 02/25/16 1101 Last data filed at 02/25/16 0900  Gross per 24 hour  Intake    880 ml  Output   1225 ml  Net   -345 ml   Filed Weights   02/23/16 2121  Weight: 124.966 kg (275 lb 8 oz)    Examination:  General exam: Appears calm and comfortable, no distress, AAOx3 Respiratory system: few basilar ronchi Cardiovascular system: S1 & S2 heard, RRR. No JVD, murmurs, rubs, gallops or clicks. Gastrointestinal system: Abdomen is nondistended, soft and nontender. No organomegaly or masses felt. Normal bowel sounds Central nervous system: Alert and oriented. No focal neurological deficits. Extremities: Symmetric 5 x 5 power, erythema and mild swelling on both lower legs with venous stasis changes Skin: erythema-both legs Psychiatry: Judgement and insight appear normal. Mood & affect appropriate.     Data Reviewed: I have personally reviewed following labs and imaging studies  CBC:  Recent Labs Lab 02/23/16 1422 02/24/16 0359 02/25/16 0504  WBC 8.4 6.7 6.8  NEUTROABS 6.7  --   --   HGB 12.9* 11.4* 11.6*  HCT 38.8* 35.0* 35.2*  MCV 98.2 99.4 95.4  PLT 244 190 [id number]   Basic Metabolic Panel:  Recent Labs Lab 02/23/16 1422 02/24/16 0359 02/25/16 0504  NA 141 141 140  K 4.1 4.6 3.7  CL 104 105 104  CO2 25 28 25    GLUCOSE 96 89 100*  BUN 26* 22* 21*  CREATININE 1.67* 1.35* 1.26*  CALCIUM 7.5* 6.6* 6.8*  MG  --  1.9  --   PHOS  --  5.2*  --    GFR: Estimated Creatinine Clearance: 72 mL/min (by C-G formula based on Cr of 1.26). Liver Function Tests:  Recent Labs Lab 02/23/16 1422 02/24/16 0359  AST 25 21  ALT 14* 12*  ALKPHOS 67 56  BILITOT 1.1 0.7  PROT 7.1 5.9*  ALBUMIN 3.9 3.2*    Recent Labs Lab 02/23/16 1533  LIPASE 27   No results for input(s): AMMONIA in the last 168 hours. Coagulation Profile: No results for input(s): INR, PROTIME in the last 168 hours. Cardiac Enzymes:  Recent Labs Lab 02/23/16 1549 02/23/16 2151 02/24/16 0359 02/24/16 1033  TROPONINI <0.03 <0.03 <0.03 <0.03   BNP (last 3 results) No results for input(s): PROBNP in the last 8760 hours. HbA1C: No results for input(s): HGBA1C in the last 72 hours. CBG: No results for input(s): GLUCAP in the last 168 hours. Lipid Profile: No results for input(s): CHOL, HDL, LDLCALC, TRIG, CHOLHDL, LDLDIRECT in the last 72 hours. Thyroid Function Tests:  Recent Labs  02/24/16 0359  TSH 3.211   Anemia Panel: No results for input(s): VITAMINB12, FOLATE, FERRITIN, TIBC, IRON, RETICCTPCT in the last 72 hours. Urine analysis:    Component Value Date/Time   COLORURINE AMBER* 02/23/2016 [place] 02/23/2016 1541   LABSPEC 1.035* 02/23/2016 1541   PHURINE 6.5 02/23/2016 1541   GLUCOSEU NEGATIVE 02/23/2016 1541   HGBUR NEGATIVE 02/23/2016 1541   BILIRUBINUR SMALL* 02/23/2016 1541   KETONESUR >80* 02/23/2016 1541   PROTEINUR 30* 02/23/2016 1541   UROBILINOGEN 0.2 07/10/2015 1630   NITRITE NEGATIVE 02/23/2016 1541   LEUKOCYTESUR TRACE* 02/23/2016 1541   Sepsis Labs: @LABRCNTIP (procalcitonin:4,lacticidven:4)  ) Recent Results (from the past 240 hour(s))  Culture, blood (Routine X 2) w Reflex to ID Panel     Status: None (Preliminary result)   Collection Time: 02/23/16  2:22 PM  Result  Value Ref Range Status   Specimen Description BLOOD LEFT HAND  Final   Special Requests BOTTLES DRAWN AEROBIC AND ANAEROBIC 5 ML  Final   Culture   Final    NO GROWTH < 24 HOURS Performed at [facility]    Report Status PENDING  Incomplete  Culture, blood (Routine X 2) w Reflex to ID Panel     Status: None (Preliminary result)   Collection Time: 02/23/16  3:00 PM  Result Value Ref Range Status   Specimen Description BLOOD RIGHT FOREARM  Final   Special Requests BOTTLES DRAWN AEROBIC AND ANAEROBIC 5 CC  Final   Culture   Final    NO GROWTH < 24 HOURS Performed at [facility]    Report Status PENDING  Incomplete  Urine culture     Status: None (Preliminary result)   Collection Time: 02/23/16  3:41 PM  Result Value Ref Range Status   Specimen Description URINE, CLEAN  CATCH  Final   Special Requests NONE  Final   Culture   Final    TOO YOUNG TO READ Performed at [facility]    Report Status PENDING  Incomplete  MRSA PCR Screening     Status: None   Collection Time: 02/23/16 10:09 PM  Result Value Ref Range Status   MRSA by PCR NEGATIVE NEGATIVE Final    Comment:        The GeneXpert MRSA Assay (FDA approved for NASAL specimens only), is one component of a comprehensive MRSA colonization surveillance program. It is not intended to diagnose MRSA infection nor to guide or monitor treatment for MRSA infections.          Radiology Studies: Dg Chest 2 View  02/23/2016  CLINICAL DATA:  Shortness of breath. History of renal cell carcinoma EXAM: CHEST  2 VIEW COMPARISON:  February 15, 2016 FINDINGS: There is no edema or consolidation. Heart is mildly enlarged with pulmonary vascularity within normal limits. No adenopathy. There is evidence of a total shoulder replacement on the right. There is degenerative change in each shoulder as well as in the thoracic spine. IMPRESSION: Stable cardiac prominence.  No edema or consolidation. Electronically Signed   By:  Lowella Grip III M.D.   On: 02/23/2016 15:24   Ct Angio Chest Pe W/cm &/or Wo Cm  02/23/2016  CLINICAL DATA:  Shortness of breath and chest pain EXAM: CT ANGIOGRAPHY CHEST WITH CONTRAST TECHNIQUE: Multidetector CT imaging of the chest was performed using the standard protocol during bolus administration of intravenous contrast. Multiplanar CT image reconstructions and MIPs were obtained to evaluate the vascular anatomy. CONTRAST:  100 mL Isovue 370 nonionic COMPARISON:  Chest CT January 16, 2012; chest radiograph Feb 23, 2016 FINDINGS: Mediastinum/Lymph Nodes: There is no demonstrable pulmonary embolus. There is prominence of the ascending thoracic aorta with a maximum transverse diameter of 4.3 x 4.0 cm. There is no thoracic aortic dissection. The visualized great vessels appear unremarkable. There is minimal pericardial effusion adjacent to the left ventricle superiorly. There are scattered foci of coronary artery calcification. There is no appreciable thoracic adenopathy. Lungs/Pleura: There is airspace consolidation in the lung bases bilaterally, primarily in the posterior and lateral segments of the left lower lobe and in the lateral segment of the right lower lobe. Mild atelectasis is also noted in the lung bases. Upper abdomen: Visualized upper abdominal structures appear unremarkable. Musculoskeletal: There is degenerative change in the thoracic spine noted. There are no blastic or lytic bone lesions. There is a total shoulder replacement on the right with artifact from this metallic prosthesis. Review of the MIP images confirms the above findings. IMPRESSION: No demonstrable pulmonary embolus. Bibasilar pneumonia, slightly more on the left than on the right. Prominence of the ascending thoracic aorta with a maximum transverse diameter of 4.3 x 4.0 cm. Recommend annual imaging followup by CTA or MRA. This recommendation follows 2010 ACCF/AHA/AATS/ACR/ASA/SCA/SCAI/SIR/STS/SVM Guidelines for the Diagnosis  and Management of Patients with Thoracic Aortic Disease. Circulation. 2010; [id number]. No aortic dissection seen. No appreciable thoracic adenopathy. Electronically Signed   By: Lowella Grip III M.D.   On: 02/23/2016 18:02   US Abdomen Complete  02/24/2016  CLINICAL DATA:  One week history of abdominal pain and decreased urine output EXAM: ABDOMEN ULTRASOUND COMPLETE COMPARISON:  CT abdomen and pelvis February 15, 2016 and July 10, 2015 FINDINGS: Gallbladder:  Surgically absent. Common bile duct: Diameter: 6 mm. There is no intrahepatic, common hepatic, or common bile  duct dilatation. Liver: No focal lesion identified.  Liver echogenicity is increased. IVC: No abnormality visualized in visualized regions. Portions of the inferior vena cava are obscured by gas. Pancreas: Essentially completely obscured by gas. Spleen: Size and appearance within normal limits. Right Kidney: Length: 12.0 cm. Echogenicity within normal limits. There is diffuse renal cortical thinning. No mass or hydronephrosis visualized. Left Kidney: Length: 0.9 cm. Echogenicity within normal limits. There is diffuse renal cortical thinning. No hydronephrosis visualized. There is a complex mass arising from the lower pole left kidney measuring 5.5 x 4.5 x 6.1 cm. There is a nonobstructing 4 mm calculus in the upper to mid left kidney. Abdominal aorta: No aneurysm visualized. Other findings: No apparent ascites. IMPRESSION: Mass arising from the lower pole left kidney, complex. By report, the patient is undergone prior cryoablation in this area. In comparison with the prior CT, some of the complexity of the current lesion is due to scarring and fat surrounding a more centrally masslike area. It should be noted that a degree of recurrent neoplasm in this lesion cannot be excluded. Further assessment is felt to be warranted in this regard. Ideally, renal MR pre and post-contrast would be the imaging study of choice to further assess.  Nonobstructing 4 mm calculus upper to mid left kidney. Gallbladder absent. Pancreas obscured by gas. Increased liver echogenicity, a finding most likely indicative of hepatic steatosis. While no focal liver lesions are identified, it must be cautioned that the sensitivity of ultrasound for focal liver lesions is diminished in this circumstance. Renal cortical thinning bilaterally, a finding that may be indicative of a degree of underlying medical renal disease. Electronically Signed   By: Lowella Grip III M.D.   On: 02/24/2016 09:08        Scheduled Meds: . azithromycin  500 mg Intravenous Q24H  . calcitRIOL  0.5 mcg Oral Daily  . cefTRIAXone (ROCEPHIN)  IV  1 g Intravenous Q24H  . clopidogrel  75 mg Oral Daily  . cycloSPORINE  1 drop Both Eyes BID  . enoxaparin (LOVENOX) injection  40 mg Subcutaneous Q24H  . fluticasone  1 spray Each Nare Daily  . furosemide  20 mg Intravenous Q12H  . guaiFENesin  600 mg Oral BID  . levothyroxine  200 mcg Oral QAC breakfast  . levothyroxine  25 mcg Oral QAC breakfast  . metoprolol tartrate  12.5 mg Oral BID  . mometasone-formoterol  2 puff Inhalation BID  . nystatin   Topical BID  . sodium chloride flush  3 mL Intravenous Q12H  . tamsulosin  0.4 mg Oral Daily  . traMADol  100 mg Oral BID  . traZODone  300 mg Oral QHS   Continuous Infusions:    LOS: 2 days    Time spent: 33min    Domenic Polite, MD [facility] Pager [phone]  If 7PM-7AM, please contact night-coverage [email or web address] Password [email or web address] 02/25/2016, 11:01 AM

## 2016-02-25 NOTE — Progress Notes (Signed)
CSW attempted to call pt dtr, Pamala Hurry, to discuss plan for pt at time of DC- left message  Pt is from [place]- confirmed this with RN at the facility- discussed pt current level of needs- facility is able to accept pt back and provide home health PT/OT in the facility if that is what family wants  CSW will continue to follow  Domenica Reamer, [employer] Worker [phone] (wknd number)

## 2016-02-25 NOTE — Evaluation (Signed)
Physical Therapy Evaluation Patient Details Name: [name] [name] MRN: [id number] DOB: 05-22-1941 Today's Date: 02/25/2016   History of Present Illness  75 y.o. male with medical history significant of dementia, COPD, coronary artery disease, ischemic heart disease renal cell carcinoma OSA on oxygen as needed admitted with PNA, cellulits BLEs, acute renal failure.   Clinical Impression  Pt admitted with above diagnosis. Pt currently with functional limitations due to the deficits listed below (see PT Problem List). Pt ambulated 16' with RW and 3L of O2. Distance limited by chest, BLE, and B lower quadrant pain. Pt from ALF, at present will need SNF due to limited mobility.  Pt will benefit from skilled PT to increase their independence and safety with mobility to allow discharge to the venue listed below.       Follow Up Recommendations SNF;Supervision/Assistance - 24 hour    Equipment Recommendations  None recommended by PT    Recommendations for Other Services       Precautions / Restrictions Precautions Precautions: Fall Restrictions Weight Bearing Restrictions: No      Mobility  Bed Mobility               General bed mobility comments: NT- up in chair  Transfers Overall transfer level: Needs assistance Equipment used: Rolling walker (2 wheeled) Transfers: Sit to/from Stand Sit to Stand: +2 physical assistance;Mod assist         General transfer comment: mod A to rise, verbal cues hand placement  Ambulation/Gait Ambulation/Gait assistance: Min assist;+2 safety/equipment Ambulation Distance (Feet): 16 Feet Assistive device: Rolling walker (2 wheeled) Gait Pattern/deviations: Step-through pattern;Decreased stride length;Decreased step length - right;Decreased step length - left   Gait velocity interpretation: Below normal speed for age/gender General Gait Details: distance limited by pain in chest, B lower sides, BLEs, ambulated 8' forward then 8' backward  with RW on 3L O2, SaO2 97%, pt anxious and SOB, verbal cues for pursed lip breathing  Stairs            Wheelchair Mobility    Modified Rankin (Stroke Patients Only)       Balance Overall balance assessment: Needs assistance   Sitting balance-Leahy Scale: Good       Standing balance-Leahy Scale: Poor Standing balance comment: requires BUE support                             Pertinent Vitals/Pain Pain Assessment: 0-10 Pain Score: 8  Pain Location: chest, B lower quadrants Pain Descriptors / Indicators: Aching Pain Intervention(s): Limited activity within patient's tolerance;Monitored during session;Premedicated before session;Patient requesting pain meds-RN notified;Utilized relaxation techniques    Home Living Family/patient expects to be discharged to:: Skilled nursing facility                 Additional Comments: pt is from ALF    Prior Function Level of Independence: Needs assistance   Gait / Transfers Assistance Needed: independent with rollator, was on 3L O2 prior to admission  ADL's / Homemaking Assistance Needed: assist for bathing, independent dressing        Hand Dominance        Extremity/Trunk Assessment   Upper Extremity Assessment: Overall WFL for tasks assessed           Lower Extremity Assessment: Generalized weakness (unable to perform manual muscle test for knee ext due to painful cellulitis BLEs, knee ext at least 3/5 B)      Cervical /  Trunk Assessment: Normal  Communication   Communication: No difficulties  Cognition Arousal/Alertness: Awake/alert Behavior During Therapy: WFL for tasks assessed/performed Overall Cognitive Status: Within Functional Limits for tasks assessed                      General Comments      Exercises        Assessment/Plan    PT Assessment Patient needs continued PT services  PT Diagnosis Difficulty walking;Generalized weakness   PT Problem List Decreased  strength;Decreased activity tolerance;Decreased balance;Pain;Decreased mobility;Obesity  PT Treatment Interventions Gait training;Functional mobility training;Therapeutic activities;Therapeutic exercise;Balance training;Patient/family education   PT Goals (Current goals can be found in the Care Plan section) Acute Rehab PT Goals Patient Stated Goal: decrease pain PT Goal Formulation: With patient Time For Goal Achievement: 03/10/16 Potential to Achieve Goals: Good    Frequency Min 3X/week   Barriers to discharge        Co-evaluation               End of Session Equipment Utilized During Treatment: Gait belt Activity Tolerance: Patient limited by fatigue;Patient limited by pain Patient left: in chair;with call bell/phone within reach;with chair alarm set Nurse Communication: Mobility status         Time: [id number] PT Time Calculation (min) (ACUTE ONLY): 14 min   Charges:   PT Evaluation $PT Eval Moderate Complexity: 1 Procedure     PT G Codes:        Philomena Doheny 02/25/2016, 9:38 AM [phone]

## 2016-02-26 ENCOUNTER — Inpatient Hospital Stay (HOSPITAL_COMMUNITY): Payer: Medicare Other

## 2016-02-26 LAB — CBC
HEMATOCRIT: 39.4 % (ref 39.0–52.0)
HEMOGLOBIN: 12.9 g/dL — AB (ref 13.0–17.0)
MCH: 32.2 pg (ref 26.0–34.0)
MCHC: 32.7 g/dL (ref 30.0–36.0)
MCV: 98.3 fL (ref 78.0–100.0)
Platelets: 222 10*3/uL (ref 150–400)
RBC: 4.01 MIL/uL — ABNORMAL LOW (ref 4.22–5.81)
RDW: 14 % (ref 11.5–15.5)
WBC: 6.8 10*3/uL (ref 4.0–10.5)

## 2016-02-26 LAB — COMPREHENSIVE METABOLIC PANEL
ALK PHOS: 63 U/L (ref 38–126)
ALT: 16 U/L — ABNORMAL LOW (ref 17–63)
ANION GAP: 12 (ref 5–15)
AST: 24 U/L (ref 15–41)
Albumin: 3.7 g/dL (ref 3.5–5.0)
BILIRUBIN TOTAL: 0.3 mg/dL (ref 0.3–1.2)
BUN: 21 mg/dL — AB (ref 6–20)
CALCIUM: 7.3 mg/dL — AB (ref 8.9–10.3)
CO2: 26 mmol/L (ref 22–32)
Chloride: 103 mmol/L (ref 101–111)
Creatinine, Ser: 1.33 mg/dL — ABNORMAL HIGH (ref 0.61–1.24)
GFR calc Af Amer: 58 mL/min — ABNORMAL LOW (ref >60)
GFR, EST NON AFRICAN AMERICAN: 50 mL/min — AB (ref >60)
GLUCOSE: 93 mg/dL (ref 65–99)
Potassium: 4.5 mmol/L (ref 3.5–5.1)
Sodium: 141 mmol/L (ref 135–145)
TOTAL PROTEIN: 6.5 g/dL (ref 6.5–8.1)

## 2016-02-26 LAB — LEGIONELLA PNEUMOPHILA SEROGP 1 UR AG: L. pneumophila Serogp 1 Ur Ag: NEGATIVE

## 2016-02-26 LAB — HEMOGLOBIN A1C
HEMOGLOBIN A1C: 5.9 % — AB (ref 4.8–5.6)
MEAN PLASMA GLUCOSE: 123 mg/dL

## 2016-02-26 NOTE — Care Management Important Message (Signed)
Important Message  Patient Details  Name: [name] [name] MRN: [id number] Date of Birth: [birth year]   Medicare Important Message Given:  Yes    Camillo Flaming 02/26/2016, 10:56 AMImportant Message  Patient Details  Name: [name] [name] MRN: [id number] Date of Birth: 1941-10-17   Medicare Important Message Given:  Yes    Camillo Flaming 02/26/2016, 10:56 AM

## 2016-02-26 NOTE — [place] FL2 (Signed)
[place] MEDICAID FL2 LEVEL OF CARE SCREENING TOOL     IDENTIFICATION  Patient Name: [name] [name] Birthdate: 03-10-1941 Sex: male Admission Date (Current Location): 02/23/2016  [facility] and [place] Number:  Herbalist and Address:  [facility],  [place] [address], [place]      Provider Number: [id number]  Attending Physician Name and Address:  Domenic Polite, MD  Relative Name and Phone Number:       Current Level of Care: Hospital Recommended Level of Care: [place] Prior Approval Number:    Date Approved/Denied:   PASRR Number:   [id number]  Discharge Plan: SNF    Current Diagnoses: Patient Active Problem List   Diagnosis Date Noted  . Dementia 02/23/2016  . CAP (community acquired pneumonia) 02/23/2016  . COPD (chronic obstructive pulmonary disease) ([place]) 02/23/2016  . Cellulitis, leg 02/23/2016  . Oliguria 02/23/2016  . Respiratory alkalosis 02/23/2016  . Chest pain 02/23/2016  . Acute on chronic renal failure ([place]) 02/23/2016  . Chronic respiratory failure with hypoxia and hypercapnia ([place]) 09/27/2015  . Morbid obesity ([place]) 07/13/2015  . Dyspnea 07/12/2015  . Healthcare-associated pneumonia 01/17/2012  . OSA (obstructive sleep apnea) 11/15/2011  . Renal mass, left 09/14/2011  . DISORDERS OF DIAPHRAGM 07/02/2010  . OBESITY HYPOVENTILATION SYNDROME 02/14/2009  . Multiple pulmonary nodules 02/14/2009  . CHOLELITHIASIS 05/04/2008  . Nonspecific (abnormal) findings on radiological and other examination of body structure 04/28/2008  . ABNORMAL CHEST XRAY 04/28/2008  . 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 HYPOPARATHYROIDISM 03/31/2008  . HYPOCALCEMIA 03/31/2008  . 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 DYSLIPIDEMIA 09/21/2007  . Essential hypertension 09/21/2007  . Allergic rhinitis 09/21/2007    Orientation RESPIRATION BLADDER Height & Weight     Self, Time, Situation, Place  O2 (3L)  Continent Weight: 275 lb 8 oz (124.966 kg) Height:  6\' 4"  (193 cm)  BEHAVIORAL SYMPTOMS/MOOD NEUROLOGICAL BOWEL NUTRITION STATUS      Continent Diet (regular/thin liquids)  AMBULATORY STATUS COMMUNICATION OF NEEDS Skin   Limited Assist Verbally Normal                       Personal Care Assistance Level of Assistance  Bathing, Feeding, Dressing Bathing Assistance: Limited assistance Feeding assistance: Independent Dressing Assistance: Limited assistance     Functional Limitations Info  Sight, Hearing, Speech Sight Info: Impaired (waers glasses) Hearing Info: Impaired Speech Info: Adequate    SPECIAL CARE FACTORS FREQUENCY  PT (By licensed PT), OT (By licensed OT)     PT Frequency: 5 OT Frequency: 5            Contractures      Additional Factors Info  Code Status, Allergies, Psychotropic, Insulin Sliding Scale, Isolation Precautions Code Status Info: Full Allergies Info: NKA Psychotropic Info: none Insulin Sliding Scale Info: none Isolation Precautions Info: none     Current Medications (02/26/2016):  This is the current hospital active medication list Current Facility-Administered Medications  Medication Dose Route Frequency Provider Last Rate Last Dose  . acetaminophen (TYLENOL) tablet 650 mg  650 mg Oral Q6H PRN Toy Baker, MD   650 mg at 02/25/16 [id number]   Or  . acetaminophen (TYLENOL) suppository 650 mg  650 mg Rectal Q6H PRN Toy Baker, MD      . albuterol (PROVENTIL) (2.5 MG/3ML) 0.083% nebulizer solution 2.5 mg  2.5 mg Nebulization Q6H PRN Toy Baker, MD      . ALPRAZolam Duanne Moron) tablet 0.25 mg  0.25 mg Oral BID PRN Domenic Polite, MD   0.25 mg at 02/26/16 1004  . azithromycin (ZITHROMAX) 500 mg in dextrose 5 % 250 mL IVPB  500 mg Intravenous Q24H Toy Baker, MD   500 mg at 02/25/16 2053  . calcitRIOL (ROCALTROL) capsule 0.5 mcg  0.5 mcg Oral Daily Toy Baker, MD   0.5 mcg at 02/26/16 1226  . cefTRIAXone (ROCEPHIN) 1  g in dextrose 5 % 50 mL IVPB  1 g Intravenous Q24H Toy Baker, MD   1 g at 02/25/16 2053  . clopidogrel (PLAVIX) tablet 75 mg  75 mg Oral Daily Toy Baker, MD   75 mg at 02/26/16 1004  . cycloSPORINE (RESTASIS) 0.05 % ophthalmic emulsion 1 drop  1 drop Both Eyes BID Toy Baker, MD   1 drop at 02/26/16 1009  . docusate sodium (COLACE) capsule 100 mg  100 mg Oral BID PRN Toy Baker, MD      . enoxaparin (LOVENOX) injection 40 mg  40 mg Subcutaneous Q24H Audrea Muscat Luttrell, RPH   40 mg at 02/25/16 2154  . fluticasone (FLONASE) 50 MCG/ACT nasal spray 1 spray  1 spray Each Nare Daily Toy Baker, MD   1 spray at 02/26/16 1000  . guaiFENesin (MUCINEX) 12 hr tablet 600 mg  600 mg Oral BID Toy Baker, MD   600 mg at 02/26/16 1004  . HYDROcodone-acetaminophen (NORCO/VICODIN) 5-325 MG per tablet 1-2 tablet  1-2 tablet Oral Q4H PRN Toy Baker, MD   1 tablet at 02/26/16 0816  . levothyroxine (SYNTHROID, LEVOTHROID) tablet 200 mcg  200 mcg Oral QAC breakfast Toy Baker, MD   200 mcg at 02/26/16 0808  . levothyroxine (SYNTHROID, LEVOTHROID) tablet 25 mcg  25 mcg Oral QAC breakfast Toy Baker, MD   25 mcg at 02/26/16 0807  . metoprolol tartrate (LOPRESSOR) tablet 12.5 mg  12.5 mg Oral BID Toy Baker, MD   12.5 mg at 02/26/16 1008  . mometasone-formoterol (DULERA) 100-5 MCG/ACT inhaler 2 puff  2 puff Inhalation BID Toy Baker, MD   2 puff at 02/26/16 0906  . MUSCLE RUB CREA   Topical PRN Domenic Polite, MD      . naphazoline-glycerin (CLEAR EYES) ophth solution 1-2 drop  1-2 drop Both Eyes QID PRN Domenic Polite, MD      . nystatin (MYCOSTATIN) powder   Topical BID Domenic Polite, MD      . ondansetron [facility]) tablet 4 mg  4 mg Oral Q6H PRN Toy Baker, MD       Or  . ondansetron (ZOFRAN) injection 4 mg  4 mg Intravenous Q6H PRN Toy Baker, MD      . polyethylene glycol (MIRALAX / GLYCOLAX) packet 17 g  17 g Oral BID  PRN Toy Baker, MD      . sodium chloride flush (NS) 0.9 % injection 3 mL  3 mL Intravenous Q12H Toy Baker, MD   3 mL at 02/26/16 1000  . tamsulosin (FLOMAX) capsule 0.4 mg  0.4 mg Oral Daily Toy Baker, MD   0.4 mg at 02/26/16 1004  . traMADol (ULTRAM) tablet 100 mg  100 mg Oral BID Toy Baker, MD   100 mg at 02/26/16 1004  . traZODone (DESYREL) tablet 300 mg  300 mg Oral QHS Toy Baker, MD   300 mg at 02/25/16 2158     Discharge Medications: Please see discharge summary for a list of discharge medications.  Relevant Imaging Results:  Relevant Lab Results:   Additional Information SS #  [id number]  [name], LCSW

## 2016-02-26 NOTE — Progress Notes (Signed)
PROGRESS NOTE    [name] [name]  [id number] DOB: 1941/08/21 DOA: 02/23/2016 PCP: Reymundo Poll, MD Outpatient Specialists: Brief Narrative: [name] [name] is a 75 y.o. male with medical history significant of dementia, COPD, coronary artery disease, ischemic heart disease renal cell carcinoma OSA on oxygen as needed. Presented with a one-week history of epigastric abdominal pain associated is some bilateral lateral chest pain coughing, he has been having rapid respirations. subjective feeing a cough nonproductive or white sputum and not associated with nausea vomiting no diarrhea. This is been associated with increased anxiety tremor hyperventilating. He has been seen about 2 weeks ago and was told he has pneumonia started on Levaquin. CTA chest, no PE, bibasilar infiltrates. Improving on Abx, multiple somatic complaints   Assessment & Plan:   Community acquired pneumonia -continue Rocephin and azithromycin -completed a course of Levaquin 1 week prior to admission -Blood Cx negative, sputum pending -SLP eval completed, no s/s of ongoing aspiration -urine pneumo Ag negative -repeat CXR due to persistent complaints   Acute on Chronic diastolic CHF and H/o CAD -ECHo with preserved EF, grade 1 DD -continue lopressor and plavix -mildly volume overloaded, suspect overhydrated with NS on admission -continue IV lasix one more dose today  . COPD (chronic obstructive pulmonary disease)  -currently stable, nebs PRN  . 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 AKi on CKD3 -improving, stopped IVF  H/o Left Renal CA S/p Cryoablation in 11/12, needs FU -Renal US cannot r/o scarring vs mass, will need FU MRI and Urology FU  Ascending thoracic aortic aneurysm  -needs FU  . Dementia  -chronic currently stable  . Essential  hypertension  - mild not on home medications.   . OSA (obstructive sleep apnea) - not on C Pap continue oxygen only as needed at night  Numerous somatic complaints  DVT prophylaxis: Lovenox   Code Status: FULL CODE as per patient  Family Communication: Family not at Bedside, left msg for daughter Pamala Hurry Disposition Plan:   ALF vs SNF in 1-2days   Antimicrobials:ROcephin/Zithromax  Subjective: Continues to have numerous complaints, cough, pain in sides, knees hurt, appetite poor, sleep poor, had a headache again  Objective: Filed Vitals:   02/25/16 1948 02/25/16 2151 02/26/16 0529 02/26/16 0907  BP:  117/77 100/66   Pulse:  81 72   Temp:  98.2 F (36.8 C) 97.9 F (36.6 C)   TempSrc:  Oral Oral   Resp:  18 18   Height:      Weight:      SpO2: 98% 96% 93% 93%    Intake/Output Summary (Last 24 hours) at 02/26/16 1209 Last data filed at 02/26/16 [id number]  Gross per 24 hour  Intake    720 ml  Output   2575 ml  Net  -1855 ml   Filed Weights   02/23/16 2121  Weight: 124.966 kg (275 lb 8 oz)    Examination:  General exam: Appears calm and comfortable, no distress, AAOx3 Respiratory system: few basilar ronchi Cardiovascular system: S1 & S2 heard, RRR. No JVD, murmurs, rubs, gallops or clicks. Gastrointestinal system: Abdomen is nondistended, soft and nontender. No organomegaly or masses felt. Normal bowel sounds Central nervous system: Alert and oriented. No focal neurological deficits. Extremities: Symmetric 5 x 5 power, erythema and mild swelling on both lower legs with venous stasis changes Skin: erythema-both legs Psychiatry:  Judgement and insight appear normal. Mood & affect appropriate.     Data Reviewed: I have personally reviewed following labs and imaging studies  CBC:  Recent Labs Lab 02/23/16 1422 02/24/16 0359 02/25/16 0504 02/26/16 0513  WBC 8.4 6.7 6.8 6.8  NEUTROABS 6.7  --   --   --   HGB 12.9* 11.4*  11.6* 12.9*  HCT 38.8* 35.0* 35.2* 39.4  MCV 98.2 99.4 95.4 98.3  PLT 244 190 191 [id number]   Basic Metabolic Panel:  Recent Labs Lab 02/23/16 1422 02/24/16 0359 02/25/16 0504 02/26/16 0513  NA 141 141 140 141  K 4.1 4.6 3.7 4.5  CL 104 105 104 103  CO2 25 28 25 26   GLUCOSE 96 89 100* 93  BUN 26* 22* 21* 21*  CREATININE 1.67* 1.35* 1.26* 1.33*  CALCIUM 7.5* 6.6* 6.8* 7.3*  MG  --  1.9  --   --   PHOS  --  5.2*  --   --    GFR: Estimated Creatinine Clearance: 68.2 mL/min (by C-G formula based on Cr of 1.33). Liver Function Tests:  Recent Labs Lab 02/23/16 1422 02/24/16 0359 02/26/16 0513  AST 25 21 24   ALT 14* 12* 16*  ALKPHOS 67 56 63  BILITOT 1.1 0.7 0.3  PROT 7.1 5.9* 6.5  ALBUMIN 3.9 3.2* 3.7    Recent Labs Lab 02/23/16 1533  LIPASE 27   No results for input(s): AMMONIA in the last 168 hours. Coagulation Profile: No results for input(s): INR, PROTIME in the last 168 hours. Cardiac Enzymes:  Recent Labs Lab 02/23/16 1549 02/23/16 2151 02/24/16 0359 02/24/16 1033  TROPONINI <0.03 <0.03 <0.03 <0.03   BNP (last 3 results) No results for input(s): PROBNP in the last 8760 hours. HbA1C:  Recent Labs  02/24/16 0359  HGBA1C 5.9*   CBG: No results for input(s): GLUCAP in the last 168 hours. Lipid Profile: No results for input(s): CHOL, HDL, LDLCALC, TRIG, CHOLHDL, LDLDIRECT in the last 72 hours. Thyroid Function Tests:  Recent Labs  02/24/16 0359  TSH 3.211   Anemia Panel: No results for input(s): VITAMINB12, FOLATE, FERRITIN, TIBC, IRON, RETICCTPCT in the last 72 hours. Urine analysis:    Component Value Date/Time   COLORURINE AMBER* 02/23/2016 [place] 02/23/2016 1541   LABSPEC 1.035* 02/23/2016 1541   PHURINE 6.5 02/23/2016 1541   GLUCOSEU NEGATIVE 02/23/2016 1541   HGBUR NEGATIVE 02/23/2016 1541   BILIRUBINUR SMALL* 02/23/2016 1541   KETONESUR >80* 02/23/2016 1541   PROTEINUR 30* 02/23/2016 1541   UROBILINOGEN 0.2  07/10/2015 1630   NITRITE NEGATIVE 02/23/2016 1541   LEUKOCYTESUR TRACE* 02/23/2016 1541   Sepsis Labs: @LABRCNTIP (procalcitonin:4,lacticidven:4)  ) Recent Results (from the past 240 hour(s))  Culture, blood (Routine X 2) w Reflex to ID Panel     Status: None (Preliminary result)   Collection Time: 02/23/16  2:22 PM  Result Value Ref Range Status   Specimen Description BLOOD LEFT HAND  Final   Special Requests BOTTLES DRAWN AEROBIC AND ANAEROBIC 5 ML  Final   Culture   Final    NO GROWTH 2 DAYS Performed at [facility]    Report Status PENDING  Incomplete  Culture, blood (Routine X 2) w Reflex to ID Panel     Status: None (Preliminary result)   Collection Time: 02/23/16  3:00 PM  Result Value Ref Range Status   Specimen Description BLOOD RIGHT FOREARM  Final   Special Requests BOTTLES DRAWN AEROBIC AND ANAEROBIC 5  CC  Final   Culture   Final    NO GROWTH 2 DAYS Performed at [facility]    Report Status PENDING  Incomplete  Urine culture     Status: Abnormal   Collection Time: 02/23/16  3:41 PM  Result Value Ref Range Status   Specimen Description URINE, CLEAN CATCH  Final   Special Requests NONE  Final   Culture MULTIPLE SPECIES PRESENT, SUGGEST RECOLLECTION (A)  Final   Report Status 02/25/2016 FINAL  Final  MRSA PCR Screening     Status: None   Collection Time: 02/23/16 10:09 PM  Result Value Ref Range Status   MRSA by PCR NEGATIVE NEGATIVE Final    Comment:        The GeneXpert MRSA Assay (FDA approved for NASAL specimens only), is one component of a comprehensive MRSA colonization surveillance program. It is not intended to diagnose MRSA infection nor to guide or monitor treatment for MRSA infections.          Radiology Studies: Dg Chest 2 View  02/26/2016  CLINICAL DATA:  Cough EXAM: CHEST  2 VIEW COMPARISON:  02/23/2016 chest radiograph. FINDINGS: Partially visualized right total shoulder arthroplasty. Slightly low lung volumes,  unchanged. Stable cardiomediastinal silhouette with normal heart size. No pneumothorax. No pleural effusion. Stable bilateral diaphragmatic eventration, left greater than right. No pulmonary edema. Bibasilar lower lobe lung opacities, left greater than right, not appreciably changed. No acute consolidative airspace disease. IMPRESSION: Stable slightly low lung volumes and bilateral left greater than right diaphragmatic eventrations with associated bilateral lower lobe lung opacities, favor atelectasis. Electronically Signed   By: Ilona Sorrel M.D.   On: 02/26/2016 10:11        Scheduled Meds: . azithromycin  500 mg Intravenous Q24H  . calcitRIOL  0.5 mcg Oral Daily  . cefTRIAXone (ROCEPHIN)  IV  1 g Intravenous Q24H  . clopidogrel  75 mg Oral Daily  . cycloSPORINE  1 drop Both Eyes BID  . enoxaparin (LOVENOX) injection  40 mg Subcutaneous Q24H  . fluticasone  1 spray Each Nare Daily  . furosemide  20 mg Intravenous Q12H  . guaiFENesin  600 mg Oral BID  . levothyroxine  200 mcg Oral QAC breakfast  . levothyroxine  25 mcg Oral QAC breakfast  . metoprolol tartrate  12.5 mg Oral BID  . mometasone-formoterol  2 puff Inhalation BID  . nystatin   Topical BID  . potassium chloride  40 mEq Oral BID  . sodium chloride flush  3 mL Intravenous Q12H  . tamsulosin  0.4 mg Oral Daily  . traMADol  100 mg Oral BID  . traZODone  300 mg Oral QHS   Continuous Infusions:    LOS: 3 days    Time spent: 41min    Domenic Polite, MD [facility] Pager [phone]  If 7PM-7AM, please contact night-coverage [email or web address] Password [email or web address] 02/26/2016, 12:09 PM

## 2016-02-26 NOTE — Clinical Social Work Note (Signed)
Clinical Social Work Assessment  Patient Details  Name: [name] [name] MRN: [id number] Date of Birth: 1940-11-08  Date of referral:  02/26/16               Reason for consult:  Facility Placement, Other (Comment Required) (originally from ALF: [facility])                Permission sought to share information with:  Case Freight forwarder, Customer service manager, Family Supports Permission granted to share information::  Yes, Verbal Permission Granted  Name::        Agency::  [facility] [place]   Relationship::  Daughter [name] [name] Information:     Housing/Transportation Living arrangements for the past 2 months:  [place] of Information:  Patient, Medical Team, Adult Children Patient Interpreter Needed:  None Criminal Activity/Legal Involvement Pertinent to Current Situation/Hospitalization:  No - Comment as needed Significant Relationships:  Adult Children, Warehouse manager Lives with:  Facility Resident Do you feel safe going back to the place where you live?  Yes (Hoping for DC to SNF, to obtain strength and improve function) Need for family participation in patient care:  Yes (Comment) (Daughter [name] [name] involved in care)  Care giving concerns:  Per patient, no care giving concerns. Reports he enjoys his facility for last year, much better placement than his previous ALF. Reports he has a roommate that keeps the room chilly and does not get enough sleep, but enjoys doing his activities and working on his machine at ALF.  Reports at this time he would like to go to SNF for a few weeks, get stronger and able to be more independent at facility.  Facility reports patient able to return with [facility] PT/OT if needed at discharge.  Capable of handing patient in ALF.   Social Worker assessment / plan:  LCSW completed assessment with patient over the phone with daughter. Daughter reports patient enjoys being in the hospital and frequents the  hospital for minor problems.  Daughter reports care at ALF is adequate and no problems at this time. Agreeable to SNF if patient requires, but also okay if he goes back to ALF. Patient hopeful for SNF, thus LCSW completed work up, updated [place] and FL2.  Will follow up with bed offers.    Employment status:  Retired Forensic scientist:  Information systems manager, Medicaid In [employer] PT Recommendations:  [place] / Referral to community resources:  [place]  Patient/Family's Response to care:  Agreeable to plan  Patient/Family's Understanding of and Emotional Response to Diagnosis, Current Treatment, and Prognosis:  Patient appears to want SNF to assist with his breathing, increase sleep, and hopeful to get stronger. Patient very dramatic in how he presents pain and current medical problems.  Patient aware and agreeable to follow recommendations per hospital staff.  Daughter understanding of patient's behaviors and proginosis.  Emotional Assessment Appearance:  Appears stated age Attitude/Demeanor/Rapport:  Attention Seeking, Other (anxious) Affect (typically observed):  Appropriate, Pleasant Orientation:  Oriented to Self, Oriented to Place, Oriented to  Time, Oriented to Situation Alcohol / Substance use:  Not Applicable Psych involvement (Current and /or in the community):  Outpatient Provider, Yes (Comment) (hx of psych in the past. No current dx or behaviors/medications)  Discharge Needs  Concerns to be addressed:  No discharge needs identified Readmission within the last 30 days:  No Current discharge risk:  None Barriers to Discharge:  Continued Medical Work up  [name], LCSW 02/26/2016, 4:25 PM

## 2016-02-26 NOTE — Evaluation (Signed)
Occupational Therapy Evaluation Patient Details Name: [name] [name] MRN: [id number] DOB: 08-Jul-1941 Today's Date: 02/26/2016    History of Present Illness 75 y.o. male with medical history significant of dementia, COPD, coronary artery disease, ischemic heart disease renal cell carcinoma OSA on oxygen as needed admitted with PNA, cellulits BLEs, acute renal failure.    Clinical Impression   Pt admitted with PNA. Pt currently with functional limitations due to the deficits listed below (see OT Problem List).  Pt will benefit from skilled OT to increase their safety and independence with ADL and functional mobility for ADL to facilitate discharge to venue listed below.      Follow Up Recommendations  SNF          Precautions / Restrictions Precautions Precautions: Fall Restrictions Weight Bearing Restrictions: No      Mobility Bed Mobility Overal bed mobility: Needs Assistance Bed Mobility: Supine to Sit     Supine to sit: Min assist;HOB elevated        Transfers Overall transfer level: Needs assistance Equipment used: Rolling walker (2 wheeled) Transfers: Sit to/from Stand Sit to Stand: Min assist                   ADL Overall ADL's : Needs assistance/impaired             Lower Body Bathing: Moderate assistance;Sit to/from stand   Upper Body Dressing : Set up;Sitting   Lower Body Dressing: Moderate assistance;Sit to/from stand   Toilet Transfer: Minimal assistance;RW;Regular Museum/gallery exhibitions officer and Hygiene: Minimal assistance;Sit to/from stand;Cueing for safety;Cueing for sequencing         General ADL Comments: Pt would benefit from [facility] prior to return to ALF               Pertinent Vitals/Pain Pain Score: 6  Pain Location: abdomen Pain Descriptors / Indicators: Sore Pain Intervention(s): Monitored during session        Extremity/Trunk Assessment Upper Extremity Assessment Upper Extremity Assessment:  Overall WFL for tasks assessed           Communication Communication Communication: No difficulties   Cognition Arousal/Alertness: Awake/alert Behavior During Therapy: WFL for tasks assessed/performed Overall Cognitive Status: Within Functional Limits for tasks assessed                                Home Living Family/patient expects to be discharged to:: Skilled nursing facility                                 Additional Comments: pt is from ALF      Prior Functioning/Environment Level of Independence: Needs assistance  Gait / Transfers Assistance Needed: independent with rollator, was on 3L O2 prior to admission ADL's / Homemaking Assistance Needed: assist for bathing, independent dressing        OT Diagnosis: Generalized weakness   OT Problem List: Decreased strength;Decreased safety awareness;Decreased activity tolerance   OT Treatment/Interventions: Self-care/ADL training;DME and/or AE instruction;Patient/family education    OT Goals(Current goals can be found in the care plan section) Acute Rehab OT Goals Patient Stated Goal: decrease pain OT Goal Formulation: With patient Time For Goal Achievement: 02/26/16  OT Frequency: Min 2X/week              End of Session Equipment Utilized During Treatment: Rolling walker  Activity  Tolerance: Patient tolerated treatment well Patient left: in chair;with call bell/phone within reach;with chair alarm set   Time: [id number] OT Time Calculation (min): 24 min Charges:  OT General Charges $OT Visit: 1 Procedure OT Evaluation $OT Eval Moderate Complexity: 1 Procedure OT Treatments $Self Care/Home Management : 8-22 mins G-Codes:    Payton Mccallum D 03/24/16, 2:00 PM

## 2016-02-26 NOTE — Clinical Social Work Placement (Signed)
   CLINICAL SOCIAL WORK PLACEMENT  NOTE  Date:  02/26/2016  Patient Details  Name: Lum Pfab Simien MRN: [id number] Date of Birth: January 02, 1941  Clinical Social Work is seeking post-discharge placement for this patient at the [place] level of care (*CSW will initial, date and re-position this form in  chart as items are completed):  Yes   Patient/family provided with [facility] Department's list of facilities offering this level of care within the geographic area requested by the patient (or if unable, by the patient's family).  Yes   Patient/family informed of their freedom to choose among providers that offer the needed level of care, that participate in Medicare, Medicaid or managed care program needed by the patient, have an available bed and are willing to accept the patient.  Yes   Patient/family informed of [facility]'s ownership interest in [facility] and [facility], as well as of the fact that they are under no obligation to receive care at these facilities.  PASRR submitted to EDS on 02/26/16     PASRR number received on 02/26/16     Existing PASRR number confirmed on       FL2 transmitted to all facilities in geographic area requested by pt/family on 02/26/16     FL2 transmitted to all facilities within larger geographic area on       Patient informed that his/her managed care company has contracts with or will negotiate with certain facilities, including the following:            Patient/family informed of bed offers received.  Patient chooses bed at       Physician recommends and patient chooses bed at      Patient to be transferred to   on  .  Patient to be transferred to facility by       Patient family notified on   of transfer.  Name of family member notified:        PHYSICIAN Please sign FL2     Additional Comment:  Patient from ALF: [facility], needing higher level of  care.  _______________________________________________ [name], LCSW 02/26/2016, 3:55 PM

## 2016-02-27 ENCOUNTER — Encounter (HOSPITAL_COMMUNITY): Payer: Self-pay | Admitting: Radiology

## 2016-02-27 ENCOUNTER — Inpatient Hospital Stay (HOSPITAL_COMMUNITY): Payer: Medicare Other

## 2016-02-27 LAB — CBC
HCT: 39 % (ref 39.0–52.0)
HEMOGLOBIN: 12.8 g/dL — AB (ref 13.0–17.0)
MCH: 32.7 pg (ref 26.0–34.0)
MCHC: 32.8 g/dL (ref 30.0–36.0)
MCV: 99.5 fL (ref 78.0–100.0)
Platelets: 225 10*3/uL (ref 150–400)
RBC: 3.92 MIL/uL — AB (ref 4.22–5.81)
RDW: 14.1 % (ref 11.5–15.5)
WBC: 6.9 10*3/uL (ref 4.0–10.5)

## 2016-02-27 LAB — BASIC METABOLIC PANEL
Anion gap: 10 (ref 5–15)
BUN: 31 mg/dL — ABNORMAL HIGH (ref 6–20)
CHLORIDE: 101 mmol/L (ref 101–111)
CO2: 29 mmol/L (ref 22–32)
CREATININE: 1.51 mg/dL — AB (ref 0.61–1.24)
Calcium: 7.2 mg/dL — ABNORMAL LOW (ref 8.9–10.3)
GFR calc non Af Amer: 43 mL/min — ABNORMAL LOW (ref >60)
GFR, EST AFRICAN AMERICAN: 50 mL/min — AB (ref >60)
Glucose, Bld: 93 mg/dL (ref 65–99)
POTASSIUM: 4.8 mmol/L (ref 3.5–5.1)
SODIUM: 140 mmol/L (ref 135–145)

## 2016-02-27 MED ORDER — SENNOSIDES-DOCUSATE SODIUM 8.6-50 MG PO TABS: 1.0000 | ORAL_TABLET | Freq: Two times a day (BID) | ORAL | Status: DC

## 2016-02-27 MED ORDER — AMOXICILLIN-POT CLAVULANATE 875-125 MG PO TABS
1.0000 | ORAL_TABLET | Freq: Two times a day (BID) | ORAL | Status: DC
  Administered 2016-02-27: 1 via ORAL
  Filled 2016-02-27: qty 1

## 2016-02-27 MED ORDER — AMOXICILLIN-POT CLAVULANATE 875-125 MG PO TABS: 1.0000 | ORAL_TABLET | Freq: Two times a day (BID) | ORAL | Status: DC

## 2016-02-27 MED ORDER — DIATRIZOATE MEGLUMINE & SODIUM 66-10 % PO SOLN
15.0000 mL | ORAL | Status: DC | PRN
  Administered 2016-02-27: 15 mL via ORAL

## 2016-02-27 MED ORDER — ALPRAZOLAM 0.25 MG PO TABS: 0.2500 mg | ORAL_TABLET | Freq: Two times a day (BID) | ORAL | Status: DC | PRN

## 2016-02-27 MED ORDER — HYDROCODONE-ACETAMINOPHEN 5-325 MG PO TABS: 1.0000 | ORAL_TABLET | Freq: Four times a day (QID) | ORAL | Status: AC | PRN

## 2016-02-27 NOTE — Progress Notes (Addendum)
PROGRESS NOTE    [name] [name]  [id number] DOB: 1941/07/27 DOA: 02/23/2016 PCP: Reymundo Poll, MD Outpatient Specialists: Brief Narrative: [name] [name] is a 75 y.o. male with medical history significant of dementia, COPD, coronary artery disease, ischemic heart disease renal cell carcinoma OSA on oxygen as needed. Presented with a one-week history of epigastric abdominal pain associated is some bilateral lateral chest pain coughing, he has been having rapid respirations. subjective feeing a cough nonproductive or white sputum and not associated with nausea vomiting no diarrhea. This is been associated with increased anxiety tremor hyperventilating. He has been seen about 2 weeks ago and was told he has pneumonia started on Levaquin. CTA chest, no PE, bibasilar infiltrates. Improving on Abx, multiple somatic complaints -this is chronic per dtr C/o increasing Abd pain, CT ordered  Assessment & Plan:   Community acquired pneumonia -s/p3days of Rocephin and azithromycin -completed a course of Levaquin 1 week prior to admission -Blood Cx negative, sputum pending -SLP eval completed, no s/s of ongoing aspiration -urine pneumo Ag negative -repeat CXR due to persistent complaints-unremarkable, mostly clear only atelectasis, change to Oral Augmentin for 3days    Acute on Chronic diastolic CHF and H/o CAD -ECHo with preserved EF, grade 1 DD -continue lopressor and plavix -mildly volume overloaded, suspect overhydrated with NS on admission -stopped IV lasix after yesterdays dose  Abd pain/both sides -[place] on admission was notable for Known L renal scarring vs mass, h/o RCC -c/o more severe pain and tenderness today, will check CT abd, SNF if that is ok  COPD (chronic obstructive pulmonary disease)  -currently stable, nebs PRN  . 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 AKi on CKD3 -improving, stopped IVF  H/o Left Renal CA S/p Cryoablation in 11/12, needs FU -Renal US cannot r/o scarring vs mass, will need FU MRI and Urology FU  Ascending thoracic aortic aneurysm  -needs FU  . Dementia  -chronic currently stable  . Essential hypertension  - mild not on home medications.   . OSA (obstructive sleep apnea) - not on C Pap continue oxygen only as needed at night  Numerous somatic complaints -called and d/w Daughter, this is chronic and he has constant complaints all the time despite stability and no objective findings, continue reassurance  DVT prophylaxis: Lovenox   Code Status: FULL CODE as per patient  Family Communication: Family not at Bedside, left msg for daughter Pamala Hurry Disposition Plan: SNF later today or in am, pending CT abd  Antimicrobials:ROcephin/Zithromax  Subjective: Continues to have worsening Abd pain,  numerous complaints, cough, pain in sides, knees hurt, appetite poor, sleep poor, had a headache again  Objective: Filed Vitals:   02/26/16 2044 02/26/16 2053 02/27/16 0438 02/27/16 0830  BP: 100/64  108/60   Pulse: 74  72 90  Temp: 99.3 F (37.4 C)  97.6 F (36.4 C)   TempSrc: Oral  Oral   Resp: 18  22 18   Height:      Weight:      SpO2: 95% 93% 99% 93%    Intake/Output Summary (Last 24 hours) at 02/27/16 1106 Last data filed at 02/27/16 1000  Gross per 24 hour  Intake   1200 ml  Output    950 ml  Net    250 ml   Filed Weights   02/23/16 2121  Weight: 124.966 kg (275  lb 8 oz)    Examination:  General exam: Appears calm and comfortable, no distress, AAOx3, but anxious and very dramatic Respiratory system: few basilar ronchi Cardiovascular system: S1 & S2 heard, RRR. No JVD, murmurs, rubs, gallops or clicks. Gastrointestinal system: Abdomen is nondistended, soft and mild L sided tenderness. No organomegaly or masses felt. Normal bowel sounds  Central nervous system: Alert and oriented. No focal neurological deficits. Extremities: Symmetric 5 x 5 power, erythema and mild swelling on both lower legs with venous stasis changes Skin: erythema-both legs Psychiatry: Judgement and insight appear normal. Mood & affect appropriate.     Data Reviewed: I have personally reviewed following labs and imaging studies  CBC:  Recent Labs Lab 02/23/16 1422 02/24/16 0359 02/25/16 0504 02/26/16 0513 02/27/16 0457  WBC 8.4 6.7 6.8 6.8 6.9  NEUTROABS 6.7  --   --   --   --   HGB 12.9* 11.4* 11.6* 12.9* 12.8*  HCT 38.8* 35.0* 35.2* 39.4 39.0  MCV 98.2 99.4 95.4 98.3 99.5  PLT 244 190 191 [id number]   Basic Metabolic Panel:  Recent Labs Lab 02/23/16 1422 02/24/16 0359 02/25/16 0504 02/26/16 0513 02/27/16 0457  NA 141 141 140 141 140  K 4.1 4.6 3.7 4.5 4.8  CL 104 105 104 103 101  CO2 25 28 25 26 29   GLUCOSE 96 89 100* 93 93  BUN 26* 22* 21* 21* 31*  CREATININE 1.67* 1.35* 1.26* 1.33* 1.51*  CALCIUM 7.5* 6.6* 6.8* 7.3* 7.2*  MG  --  1.9  --   --   --   PHOS  --  5.2*  --   --   --    GFR: Estimated Creatinine Clearance: 60.1 mL/min (by C-G formula based on Cr of 1.51). Liver Function Tests:  Recent Labs Lab 02/23/16 1422 02/24/16 0359 02/26/16 0513  AST 25 21 24   ALT 14* 12* 16*  ALKPHOS 67 56 63  BILITOT 1.1 0.7 0.3  PROT 7.1 5.9* 6.5  ALBUMIN 3.9 3.2* 3.7    Recent Labs Lab 02/23/16 1533  LIPASE 27   No results for input(s): AMMONIA in the last 168 hours. Coagulation Profile: No results for input(s): INR, PROTIME in the last 168 hours. Cardiac Enzymes:  Recent Labs Lab 02/23/16 1549 02/23/16 2151 02/24/16 0359 02/24/16 1033  TROPONINI <0.03 <0.03 <0.03 <0.03   BNP (last 3 results) No results for input(s): PROBNP in the last 8760 hours. HbA1C: No results for input(s): HGBA1C in the last 72 hours. CBG: No results for input(s): GLUCAP in the last 168 hours. Lipid Profile: No results for input(s):  CHOL, HDL, LDLCALC, TRIG, CHOLHDL, LDLDIRECT in the last 72 hours. Thyroid Function Tests: No results for input(s): TSH, T4TOTAL, FREET4, T3FREE, THYROIDAB in the last 72 hours. Anemia Panel: No results for input(s): VITAMINB12, FOLATE, FERRITIN, TIBC, IRON, RETICCTPCT in the last 72 hours. Urine analysis:    Component Value Date/Time   COLORURINE AMBER* 02/23/2016 [place] 02/23/2016 1541   LABSPEC 1.035* 02/23/2016 1541   PHURINE 6.5 02/23/2016 1541   GLUCOSEU NEGATIVE 02/23/2016 1541   HGBUR NEGATIVE 02/23/2016 1541   BILIRUBINUR SMALL* 02/23/2016 1541   KETONESUR >80* 02/23/2016 1541   PROTEINUR 30* 02/23/2016 1541   UROBILINOGEN 0.2 07/10/2015 1630   NITRITE NEGATIVE 02/23/2016 1541   LEUKOCYTESUR TRACE* 02/23/2016 1541   Sepsis Labs: @LABRCNTIP (procalcitonin:4,lacticidven:4)  ) Recent Results (from the past 240 hour(s))  Culture, blood (Routine X 2) w Reflex to ID Panel  Status: None (Preliminary result)   Collection Time: 02/23/16  2:22 PM  Result Value Ref Range Status   Specimen Description BLOOD LEFT HAND  Final   Special Requests BOTTLES DRAWN AEROBIC AND ANAEROBIC 5 ML  Final   Culture   Final    NO GROWTH 3 DAYS Performed at [facility]    Report Status PENDING  Incomplete  Culture, blood (Routine X 2) w Reflex to ID Panel     Status: None (Preliminary result)   Collection Time: 02/23/16  3:00 PM  Result Value Ref Range Status   Specimen Description BLOOD RIGHT FOREARM  Final   Special Requests BOTTLES DRAWN AEROBIC AND ANAEROBIC 5 CC  Final   Culture   Final    NO GROWTH 3 DAYS Performed at [facility]    Report Status PENDING  Incomplete  Urine culture     Status: Abnormal   Collection Time: 02/23/16  3:41 PM  Result Value Ref Range Status   Specimen Description URINE, CLEAN CATCH  Final   Special Requests NONE  Final   Culture MULTIPLE SPECIES PRESENT, SUGGEST RECOLLECTION (A)  Final   Report Status 02/25/2016  FINAL  Final  MRSA PCR Screening     Status: None   Collection Time: 02/23/16 10:09 PM  Result Value Ref Range Status   MRSA by PCR NEGATIVE NEGATIVE Final    Comment:        The GeneXpert MRSA Assay (FDA approved for NASAL specimens only), is one component of a comprehensive MRSA colonization surveillance program. It is not intended to diagnose MRSA infection nor to guide or monitor treatment for MRSA infections.          Radiology Studies: Dg Chest 2 View  02/26/2016  CLINICAL DATA:  Cough EXAM: CHEST  2 VIEW COMPARISON:  02/23/2016 chest radiograph. FINDINGS: Partially visualized right total shoulder arthroplasty. Slightly low lung volumes, unchanged. Stable cardiomediastinal silhouette with normal heart size. No pneumothorax. No pleural effusion. Stable bilateral diaphragmatic eventration, left greater than right. No pulmonary edema. Bibasilar lower lobe lung opacities, left greater than right, not appreciably changed. No acute consolidative airspace disease. IMPRESSION: Stable slightly low lung volumes and bilateral left greater than right diaphragmatic eventrations with associated bilateral lower lobe lung opacities, favor atelectasis. Electronically Signed   By: Ilona Sorrel M.D.   On: 02/26/2016 10:11        Scheduled Meds: . amoxicillin-clavulanate  1 tablet Oral Q12H  . calcitRIOL  0.5 mcg Oral Daily  . clopidogrel  75 mg Oral Daily  . cycloSPORINE  1 drop Both Eyes BID  . enoxaparin (LOVENOX) injection  40 mg Subcutaneous Q24H  . fluticasone  1 spray Each Nare Daily  . guaiFENesin  600 mg Oral BID  . levothyroxine  200 mcg Oral QAC breakfast  . levothyroxine  25 mcg Oral QAC breakfast  . metoprolol tartrate  12.5 mg Oral BID  . mometasone-formoterol  2 puff Inhalation BID  . nystatin   Topical BID  . sodium chloride flush  3 mL Intravenous Q12H  . tamsulosin  0.4 mg Oral Daily  . traMADol  100 mg Oral BID  . traZODone  300 mg Oral QHS   Continuous Infusions:     LOS: 4 days    Time spent: 9min    Domenic Polite, MD [facility] Pager [phone]  If 7PM-7AM, please contact night-coverage [email or web address] Password [email or web address] 02/27/2016, 11:06 AM

## 2016-02-27 NOTE — Progress Notes (Signed)
Prescriptions have been signed and placed in Discharge packet. PTAR [phone]  Contacted to arrange transport.

## 2016-02-27 NOTE — Progress Notes (Signed)
SW made primary RN aware to call report to [facility] facility [phone]). Spoke to [place], questions denied.

## 2016-02-27 NOTE — Discharge Summary (Signed)
Physician Discharge Summary  [name] [name] [id number] DOB: 05/01/1941 DOA: 02/23/2016  PCP: Reymundo Poll, MD  Admit date: 02/23/2016 Discharge date: 02/27/2016  Time spent: 45 minutes  Recommendations for Outpatient Follow-up:  1. PCP in 1 week 2. Urologist in 1-70months  3. CAUTION: pt has numerous somatic complaints all the time, largely suspected to be Functional(Psychogenic/anxiety mediated), please look for objective findings when evaluating his complaints    Discharge Diagnoses:    Pneumonia   Anxiety   Essential hypertension   OSA (obstructive sleep apnea)   Dementia   CAP (community acquired pneumonia)   COPD (chronic obstructive pulmonary disease) (HCC)   Cellulitis, leg   Oliguria   Respiratory alkalosis   Chest pain   Acute on chronic renal failure [facility])   Discharge Condition: stable  Diet recommendation: low sodium  Filed Weights   02/23/16 2121  Weight: 124.966 kg (275 lb 8 oz)    History of present illness:  [name] [name] is a 75 y.o. male with medical history significant of dementia, COPD, coronary artery disease, ischemic heart disease renal cell carcinoma OSA on oxygen as needed. Presented with1-week history of epigastric abdominal pain associated with bilateral lateral chest pain coughing, he has been having rapid respirations. subjective feeing a cough nonproductive or white sputum and not associated with nausea vomiting no diarrhea. This was associated with increased anxiety tremor hyperventilating.  Hospital Course:  Community acquired pneumonia -s/p3days of Rocephin and azithromycin -completed a course of Levaquin 1 week prior to admission -Blood Cx negative, sputum pending -SLP eval completed, no s/s of ongoing aspiration -urine pneumo Ag negative -repeat CXR due to persistent complaints-unremarkable, mostly clear only atelectasis, changed to Oral Augmentin for 3days   Acute on Chronic diastolic CHF and H/o CAD -ECHo with preserved EF,  grade 1 DD -continue lopressor and plavix -suspect overhydrated with NS on admission, given single dose of IV lasix, has not required diuretics after that, not on diuretics chronically  Numerous somatic complaints -noted throughout hospitalization, i called and d/w Daughter, this is chronic and he has constant complaints all the time despite stability and no objective findings of acute illness or decompensation, continue reassurance -daughter will notify the facility about his behavior so that frequent Trips to the ER can be limited to when actually needed only  Abd pain/both sides -[place] on admission was notable for Known L renal scarring  h/o RCC -due to constant reports of pain, i did a CT scan today which was without acute findings, except stool and stable L renal lesion, s/p Cryoablation  -he is afebrile, and tolerating all his meals and having bowel movements despite his symptoms   COPD (chronic obstructive pulmonary disease)  -currently stable, nebs PRN  . 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 Dementia  -stable, with anxiety and requires a lot of reassurance  . Essential hypertension  - mild not on home medications.   . OSA (obstructive sleep apnea) - not on C Pap continue oxygen only as needed at night  Discharge Exam: Filed Vitals:   02/27/16 0830 02/27/16 1500  BP:  107/72  Pulse: 90 76  Temp:  98 F (36.7 C)  Resp: 18 20    General: AAOx3 Cardiovascular: S1S2/RRR Respiratory: CTAB  Discharge Instructions   Discharge Instructions    Diet - low sodium heart healthy    Complete by:  As directed       Increase activity slowly    Complete by:  As directed           Current Discharge Medication List    START taking these medications   Details  ALPRAZolam (XANAX) 0.25 MG tablet Take 1 tablet (0.25 mg total) by mouth 2 (two) times daily as needed for anxiety. Qty: 30 tablet, Refills: 0    amoxicillin-clavulanate (AUGMENTIN) 875-125 MG tablet Take 1 tablet by mouth every 12 (twelve) hours. For 3days Qty: 6 tablet, Refills: 0    senna-docusate (SENOKOT-S) 8.6-50 MG tablet Take 1 tablet by mouth 2 (two) times daily.      CONTINUE these medications which have CHANGED   Details  HYDROcodone-acetaminophen (NORCO/VICODIN) 5-325 MG tablet Take 1 tablet by mouth every 6 (six) hours as needed for moderate pain. Qty: 30 tablet, Refills: 0      CONTINUE these medications which have NOT CHANGED   Details  acetaminophen (TYLENOL) 500 MG tablet Take 1,000 mg by mouth 2 (two) times daily.    albuterol (PROVENTIL) (2.5 MG/3ML) 0.083% nebulizer solution Take 2.5 mg by nebulization every 6 (six) hours as needed for wheezing or shortness of breath.    calcitRIOL (ROCALTROL) 0.5 MCG capsule Take 0.5 mcg by mouth daily.    calcium carbonate (TUMS - DOSED IN MG ELEMENTAL CALCIUM) 500 MG chewable tablet Chew 2 tablets by mouth daily as needed for indigestion or heartburn.    carboxymethylcellulose 1 % ophthalmic solution Place 1 drop into both eyes 2 (two) times daily.    clopidogrel (PLAVIX) 75 MG tablet Take 75 mg by mouth every morning.     cyanocobalamin 500 MCG tablet Take 500 mcg by mouth daily.    cycloSPORINE (RESTASIS) 0.05 % ophthalmic emulsion Place 1 drop into both eyes 2 (two) times daily.     Dextromethorphan-Guaifenesin (DIABETIC TUSSIN DM MAX ST PO) Take 5 mLs by mouth 2 (two) times daily as needed (for cough).    fluticasone (FLONASE) 50 MCG/ACT nasal spray Place 1 spray into the nose daily as needed for allergies.     ipratropium (ATROVENT) 0.02 % nebulizer solution Take 0.5 mg  by nebulization every 6 (six) hours as needed for wheezing or shortness of breath.    !! levothyroxine (SYNTHROID, LEVOTHROID) 200 MCG tablet Take 200 mcg by mouth daily.     !! levothyroxine (SYNTHROID, LEVOTHROID) 25 MCG tablet Take 25 mcg by mouth daily before breakfast.    metoprolol tartrate (LOPRESSOR) 25 MG tablet Take 12.5 mg by mouth 2 (two) times daily. Hold for systolic blood pressure <100    mometasone-formoterol (DULERA) 100-5 MCG/ACT AERO Inhale 2 puffs into the lungs 2 (two) times daily.    OXYGEN Inhale into the lungs at bedtime.    polyethylene glycol powder (GLYCOLAX/MIRALAX) powder Take 1 Container by mouth 2 (two) times daily. Mix 1 capful (17g) in 8 ounces of fluid and drink    Tamsulosin HCl (FLOMAX) 0.4 MG CAPS Take 0.4 mg by mouth daily. Hold if SBP < 100    traMADol (ULTRAM) 50 MG tablet Take 100 mg by mouth 2 (two) times daily.    traZODone (DESYREL) 150 MG tablet Take 300 mg by mouth at bedtime.     !! - Potential  duplicate medications found. Please discuss with provider.    STOP taking these medications     docusate sodium (COLACE) 100 MG capsule      levofloxacin (LEVAQUIN) 500 MG tablet      nystatin cream (MYCOSTATIN)        No Known Allergies Follow-up Information    Follow up with Reymundo Poll, MD In 1 week.   Specialty:  Family Medicine   Contact information:   [address] [place] [address] [phone]        The results of significant diagnostics from this hospitalization (including imaging, microbiology, ancillary and laboratory) are listed below for reference.    Significant Diagnostic Studies: Ct Abdomen Pelvis Wo Contrast  02/27/2016  CLINICAL DATA:  Epigastric abdominal pain. History of pneumonia and renal cell carcinoma. EXAM: CT ABDOMEN AND PELVIS WITHOUT CONTRAST TECHNIQUE: Multidetector CT imaging of the abdomen and pelvis was performed following the standard protocol without IV contrast. COMPARISON:  CT  02/15/2016 FINDINGS: Lower chest: Densely calcified coronary arteries. Airspace disease in both lower lobes is unchanged since prior study. No pleural effusions. Hepatobiliary: Unremarkable unenhanced appearance. Prior cholecystectomy Pancreas: Fatty replacement.  No focal abnormality. Spleen: Unremarkable unenhanced appearance. Adrenals/Urinary Tract: Again noted is the solid partially calcified mass at the lower pole of the right kidney measuring 3.8 x 3.0 cm, not significantly changed. Prior cryoablation changes. No hydronephrosis. No focal right renal abnormality on this unenhanced study. Adrenal glands and urinary bladder unremarkable. Stomach/Bowel: Moderate stool throughout the colon. Appendix is normal. Stomach, large and small bowel grossly unremarkable. Vascular/Lymphatic: Scattered aortic and iliac calcifications which are mildly tortuous. No aneurysm. No adenopathy. Reproductive: Central prostate calcifications. Other: No free fluid or free air. Small umbilical hernia containing a small bowel loop. No obstruction. Musculoskeletal: Diffuse degenerative changes throughout the lumbar spine. No acute or focal bony abnormality. IMPRESSION: Stable previously cryoablated left lower pole renal mass, stable Moderate stool burden. Coronary artery calcifications diffusely.  Aortic atherosclerosis. Small umbilical hernia containing a small bowel loop. No obstruction. Stable patchy bilateral lower lobe airspace opacities. Electronically Signed   By: Rolm Baptise M.D.   On: 02/27/2016 14:48   Dg Chest 2 View  02/26/2016  CLINICAL DATA:  Cough EXAM: CHEST  2 VIEW COMPARISON:  02/23/2016 chest radiograph. FINDINGS: Partially visualized right total shoulder arthroplasty. Slightly low lung volumes, unchanged. Stable cardiomediastinal silhouette with normal heart size. No pneumothorax. No pleural effusion. Stable bilateral diaphragmatic eventration, left greater than right. No pulmonary edema. Bibasilar lower lobe lung  opacities, left greater than right, not appreciably changed. No acute consolidative airspace disease. IMPRESSION: Stable slightly low lung volumes and bilateral left greater than right diaphragmatic eventrations with associated bilateral lower lobe lung opacities, favor atelectasis. Electronically Signed   By: Ilona Sorrel M.D.   On: 02/26/2016 10:11   Dg Chest 2 View  02/23/2016  CLINICAL DATA:  Shortness of breath. History of renal cell carcinoma EXAM: CHEST  2 VIEW COMPARISON:  February 15, 2016 FINDINGS: There is no edema or consolidation. Heart is mildly enlarged with pulmonary vascularity within normal limits. No adenopathy. There is evidence of a total shoulder replacement on the right. There is degenerative change in each shoulder as well as in the thoracic spine. IMPRESSION: Stable cardiac prominence.  No edema or consolidation. Electronically Signed   By: Lowella Grip III M.D.   On: 02/23/2016 15:24   Dg Chest 2 View  02/15/2016  CLINICAL DATA:  Increasing shortness of breath today. EXAM:  CHEST  2 VIEW COMPARISON:  September 26, 2015 FINDINGS: The heart size and mediastinal contours are stable. The heart size is enlarged. There is no focal infiltrate, pulmonary edema, or pleural effusion. The visualized skeletal structures are stable. IMPRESSION: No active cardiopulmonary disease. Electronically Signed   By: Abelardo Diesel M.D.   On: 02/15/2016 20:07   Ct Angio Chest Pe W/cm &/or Wo Cm  02/23/2016  CLINICAL DATA:  Shortness of breath and chest pain EXAM: CT ANGIOGRAPHY CHEST WITH CONTRAST TECHNIQUE: Multidetector CT imaging of the chest was performed using the standard protocol during bolus administration of intravenous contrast. Multiplanar CT image reconstructions and MIPs were obtained to evaluate the vascular anatomy. CONTRAST:  100 mL Isovue 370 nonionic COMPARISON:  Chest CT January 16, 2012; chest radiograph Feb 23, 2016 FINDINGS: Mediastinum/Lymph Nodes: There is no demonstrable pulmonary  embolus. There is prominence of the ascending thoracic aorta with a maximum transverse diameter of 4.3 x 4.0 cm. There is no thoracic aortic dissection. The visualized great vessels appear unremarkable. There is minimal pericardial effusion adjacent to the left ventricle superiorly. There are scattered foci of coronary artery calcification. There is no appreciable thoracic adenopathy. Lungs/Pleura: There is airspace consolidation in the lung bases bilaterally, primarily in the posterior and lateral segments of the left lower lobe and in the lateral segment of the right lower lobe. Mild atelectasis is also noted in the lung bases. Upper abdomen: Visualized upper abdominal structures appear unremarkable. Musculoskeletal: There is degenerative change in the thoracic spine noted. There are no blastic or lytic bone lesions. There is a total shoulder replacement on the right with artifact from this metallic prosthesis. Review of the MIP images confirms the above findings. IMPRESSION: No demonstrable pulmonary embolus. Bibasilar pneumonia, slightly more on the left than on the right. Prominence of the ascending thoracic aorta with a maximum transverse diameter of 4.3 x 4.0 cm. Recommend annual imaging followup by CTA or MRA. This recommendation follows 2010 ACCF/AHA/AATS/ACR/ASA/SCA/SCAI/SIR/STS/SVM Guidelines for the Diagnosis and Management of Patients with Thoracic Aortic Disease. Circulation. 2010; [id number]. No aortic dissection seen. No appreciable thoracic adenopathy. Electronically Signed   By: Lowella Grip III M.D.   On: 02/23/2016 18:02   US Abdomen Complete  02/24/2016  CLINICAL DATA:  One week history of abdominal pain and decreased urine output EXAM: ABDOMEN ULTRASOUND COMPLETE COMPARISON:  CT abdomen and pelvis February 15, 2016 and July 10, 2015 FINDINGS: Gallbladder:  Surgically absent. Common bile duct: Diameter: 6 mm. There is no intrahepatic, common hepatic, or common bile duct dilatation.  Liver: No focal lesion identified.  Liver echogenicity is increased. IVC: No abnormality visualized in visualized regions. Portions of the inferior vena cava are obscured by gas. Pancreas: Essentially completely obscured by gas. Spleen: Size and appearance within normal limits. Right Kidney: Length: 12.0 cm. Echogenicity within normal limits. There is diffuse renal cortical thinning. No mass or hydronephrosis visualized. Left Kidney: Length: 0.9 cm. Echogenicity within normal limits. There is diffuse renal cortical thinning. No hydronephrosis visualized. There is a complex mass arising from the lower pole left kidney measuring 5.5 x 4.5 x 6.1 cm. There is a nonobstructing 4 mm calculus in the upper to mid left kidney. Abdominal aorta: No aneurysm visualized. Other findings: No apparent ascites. IMPRESSION: Mass arising from the lower pole left kidney, complex. By report, the patient is undergone prior cryoablation in this area. In comparison with the prior CT, some of the complexity of the current lesion is due to scarring and fat  surrounding a more centrally masslike area. It should be noted that a degree of recurrent neoplasm in this lesion cannot be excluded. Further assessment is felt to be warranted in this regard. Ideally, renal MR pre and post-contrast would be the imaging study of choice to further assess. Nonobstructing 4 mm calculus upper to mid left kidney. Gallbladder absent. Pancreas obscured by gas. Increased liver echogenicity, a finding most likely indicative of hepatic steatosis. While no focal liver lesions are identified, it must be cautioned that the sensitivity of ultrasound for focal liver lesions is diminished in this circumstance. Renal cortical thinning bilaterally, a finding that may be indicative of a degree of underlying medical renal disease. Electronically Signed   By: Lowella Grip III M.D.   On: 02/24/2016 09:08   Ct Abdomen Pelvis W Contrast  02/15/2016  CLINICAL DATA:  Acute  onset of generalized abdominal pain and testicular pain. Initial encounter. EXAM: CT ABDOMEN AND PELVIS WITH CONTRAST TECHNIQUE: Multidetector CT imaging of the abdomen and pelvis was performed using the standard protocol following bolus administration of intravenous contrast. CONTRAST:  148mL ISOVUE-300 IOPAMIDOL (ISOVUE-300) INJECTION 61% COMPARISON:  CT abdomen and pelvis from 07/10/2015 FINDINGS: There is mild somewhat nodular partial consolidation at both lower lung lobes, which may reflect an atypical infectious process. Scattered coronary artery calcifications are seen. The liver and spleen are unremarkable in appearance. The patient is status post cholecystectomy, with clips noted at the gallbladder fossa. The pancreas and adrenal glands are unremarkable. The mass at the lower pole of the left kidney has increased mildly in size since the prior study, measuring approximately 3.4 x 3.3 x 2.7 cm. Changes of prior cryoablation are noted, with surrounding calcification and chronic soft tissue inflammation. Nonspecific perinephric stranding is noted bilaterally. An additional 4 mm stone is noted at the interpole region of the left kidney. There is no evidence of hydronephrosis. No obstructing ureteral stones are identified. No free fluid is identified. The small bowel is unremarkable in appearance. The stomach is within normal limits. No acute vascular abnormalities are seen. Minimal calcification is noted along the left common iliac artery and its branches. The appendix is normal in caliber and contains air, without evidence of appendicitis. Minimal diverticulosis is noted along the sigmoid colon, without evidence of diverticulitis. The bladder is mildly distended and grossly unremarkable. The prostate remains normal in size. No inguinal lymphadenopathy is seen. No acute osseous abnormalities are identified. Multilevel vacuum phenomenon is noted along the lumbar spine, with endplate sclerotic change. IMPRESSION:  1. Mild somewhat nodular partial consolidation at the lung bases, which may reflect an atypical infectious process. Would correlate for any associated symptoms. 2. Previously cryoablated mass at the lower pole of the left kidney has increased mildly in size, measuring approximately 3.4 x 3.3 x 2.7 cm, compared to 3.1 x 3.0 x 2.7 cm on the prior study. This raises concern for persistent malignancy. Further evaluation is recommended. 3. Scattered coronary artery calcifications seen. 4. 4 mm nonobstructing stone at the interpole region of the left kidney. Mild calcification noted adjacent to the mass at the lower pole of the left kidney. 5. Mild degenerative change along the lumbar spine. 6. Minimal diverticulosis along the sigmoid colon, without evidence of diverticulitis. Electronically Signed   By: Garald Balding M.D.   On: 02/15/2016 22:13    Microbiology: Recent Results (from the past 240 hour(s))  Culture, blood (Routine X 2) w Reflex to ID Panel     Status: None (Preliminary result)  Collection Time: 02/23/16  2:22 PM  Result Value Ref Range Status   Specimen Description BLOOD LEFT HAND  Final   Special Requests BOTTLES DRAWN AEROBIC AND ANAEROBIC 5 ML  Final   Culture   Final    NO GROWTH 3 DAYS Performed at [facility]    Report Status PENDING  Incomplete  Culture, blood (Routine X 2) w Reflex to ID Panel     Status: None (Preliminary result)   Collection Time: 02/23/16  3:00 PM  Result Value Ref Range Status   Specimen Description BLOOD RIGHT FOREARM  Final   Special Requests BOTTLES DRAWN AEROBIC AND ANAEROBIC 5 CC  Final   Culture   Final    NO GROWTH 3 DAYS Performed at [facility]    Report Status PENDING  Incomplete  Urine culture     Status: Abnormal   Collection Time: 02/23/16  3:41 PM  Result Value Ref Range Status   Specimen Description URINE, CLEAN CATCH  Final   Special Requests NONE  Final   Culture MULTIPLE SPECIES PRESENT, SUGGEST RECOLLECTION (A)   Final   Report Status 02/25/2016 FINAL  Final  MRSA PCR Screening     Status: None   Collection Time: 02/23/16 10:09 PM  Result Value Ref Range Status   MRSA by PCR NEGATIVE NEGATIVE Final    Comment:        The GeneXpert MRSA Assay (FDA approved for NASAL specimens only), is one component of a comprehensive MRSA colonization surveillance program. It is not intended to diagnose MRSA infection nor to guide or monitor treatment for MRSA infections.      Labs: Basic Metabolic Panel:  Recent Labs Lab 02/23/16 1422 02/24/16 0359 02/25/16 0504 02/26/16 0513 02/27/16 0457  NA 141 141 140 141 140  K 4.1 4.6 3.7 4.5 4.8  CL 104 105 104 103 101  CO2 25 28 25 26 29   GLUCOSE 96 89 100* 93 93  BUN 26* 22* 21* 21* 31*  CREATININE 1.67* 1.35* 1.26* 1.33* 1.51*  CALCIUM 7.5* 6.6* 6.8* 7.3* 7.2*  MG  --  1.9  --   --   --   PHOS  --  5.2*  --   --   --    Liver Function Tests:  Recent Labs Lab 02/23/16 1422 02/24/16 0359 02/26/16 0513  AST 25 21 24   ALT 14* 12* 16*  ALKPHOS 67 56 63  BILITOT 1.1 0.7 0.3  PROT 7.1 5.9* 6.5  ALBUMIN 3.9 3.2* 3.7    Recent Labs Lab 02/23/16 1533  LIPASE 27   No results for input(s): AMMONIA in the last 168 hours. CBC:  Recent Labs Lab 02/23/16 1422 02/24/16 0359 02/25/16 0504 02/26/16 0513 02/27/16 0457  WBC 8.4 6.7 6.8 6.8 6.9  NEUTROABS 6.7  --   --   --   --   HGB 12.9* 11.4* 11.6* 12.9* 12.8*  HCT 38.8* 35.0* 35.2* 39.4 39.0  MCV 98.2 99.4 95.4 98.3 99.5  PLT 244 190 191 222 225   Cardiac Enzymes:  Recent Labs Lab 02/23/16 1549 02/23/16 2151 02/24/16 0359 02/24/16 1033  TROPONINI <0.03 <0.03 <0.03 <0.03   BNP: BNP (last 3 results)  Recent Labs  02/15/16 1928  BNP 35.3    ProBNP (last 3 results) No results for input(s): PROBNP in the last 8760 hours.  CBG: No results for input(s): GLUCAP in the last 168 hours.     SignedDomenic Polite MD.  [facility] 02/27/2016,  3:35 PM

## 2016-02-27 NOTE — Clinical Social Work Placement (Addendum)
CSW provided patient with SNF bed offers & patient accepted bed at [facility]. CSW confirmed with [name] at [facility] that they would be able to take patient today.   Patient is set to discharge to [facility] today. Patient aware, CSW left voicemail for patient's daughter, [name] [name] (ph#: [phone]) - patient states that daughter is currently at work but to leave her a message. Discharge packet given to RN, [employer]. [facility] called for transport.     Raynaldo Opitz, [facility] Clinical Social Worker cell #: [phone]    CLINICAL SOCIAL WORK PLACEMENT  NOTE  Date:  02/27/2016  Patient Details  Name: [name] [name] MRN: [id number] Date of Birth: [birth year]  Clinical Social Work is seeking post-discharge placement for this patient at the [place] level of care (*CSW will initial, date and re-position this form in  chart as items are completed):  Yes   Patient/family provided with [facility] Work Department's list of facilities offering this level of care within the geographic area requested by the patient (or if unable, by the patient's family).  Yes   Patient/family informed of their freedom to choose among providers that offer the needed level of care, that participate in Medicare, Medicaid or managed care program needed by the patient, have an available bed and are willing to accept the patient.  Yes   Patient/family informed of [facility]'s ownership interest in [facility] and [facility], as well as of the fact that they are under no obligation to receive care at these facilities.  PASRR submitted to EDS on 02/26/16     PASRR number received on 02/26/16     Existing PASRR number confirmed on       FL2 transmitted to all facilities in geographic area requested by pt/family on 02/26/16     FL2 transmitted to all facilities within larger geographic area on       Patient informed that his/her managed  care company has contracts with or will negotiate with certain facilities, including the following:        Yes   Patient/family informed of bed offers received.  Patient chooses bed at [facility]     Physician recommends and patient chooses bed at      Patient to be transferred to [facility] on 02/27/16.  Patient to be transferred to facility by [facility]     Patient family notified on 02/27/16 of transfer.  Name of family member notified:  message left for patient's daughter, [name] [name]     PHYSICIAN Please sign FL2     Additional Comment:    _______________________________________________ Standley Brooking, LCSW 02/27/2016, 3:48 PM

## 2016-02-27 NOTE — Progress Notes (Signed)
[facility] arrived, VS stable 124/78, 90, 18. Pt at baseline

## 2016-02-28 LAB — CULTURE, BLOOD (ROUTINE X 2)
CULTURE: NO GROWTH
Culture: NO GROWTH

## 2016-02-29 ENCOUNTER — Non-Acute Institutional Stay (SKILLED_NURSING_FACILITY): Payer: Medicare Other | Admitting: Internal Medicine

## 2016-02-29 ENCOUNTER — Encounter: Payer: Self-pay | Admitting: Internal Medicine

## 2016-02-29 DIAGNOSIS — N179 Acute kidney failure, unspecified: Secondary | ICD-10-CM

## 2016-02-29 DIAGNOSIS — I712 Thoracic aortic aneurysm, without rupture, unspecified: Secondary | ICD-10-CM

## 2016-02-29 DIAGNOSIS — J9611 Chronic respiratory failure with hypoxia: Secondary | ICD-10-CM

## 2016-02-29 DIAGNOSIS — E890 Postprocedural hypothyroidism: Secondary | ICD-10-CM | POA: Diagnosis not present

## 2016-02-29 DIAGNOSIS — G4733 Obstructive sleep apnea (adult) (pediatric): Secondary | ICD-10-CM

## 2016-02-29 DIAGNOSIS — I2510 Atherosclerotic heart disease of native coronary artery without angina pectoris: Secondary | ICD-10-CM

## 2016-02-29 DIAGNOSIS — Z85528 Personal history of other malignant neoplasm of kidney: Secondary | ICD-10-CM

## 2016-02-29 DIAGNOSIS — J410 Simple chronic bronchitis: Secondary | ICD-10-CM

## 2016-02-29 DIAGNOSIS — F0391 Unspecified dementia with behavioral disturbance: Secondary | ICD-10-CM

## 2016-02-29 DIAGNOSIS — J189 Pneumonia, unspecified organism: Secondary | ICD-10-CM

## 2016-02-29 DIAGNOSIS — I5032 Chronic diastolic (congestive) heart failure: Secondary | ICD-10-CM | POA: Diagnosis not present

## 2016-02-29 DIAGNOSIS — J9612 Chronic respiratory failure with hypercapnia: Secondary | ICD-10-CM

## 2016-02-29 DIAGNOSIS — R1010 Upper abdominal pain, unspecified: Secondary | ICD-10-CM

## 2016-02-29 DIAGNOSIS — L03119 Cellulitis of unspecified part of limb: Secondary | ICD-10-CM | POA: Diagnosis not present

## 2016-02-29 DIAGNOSIS — I110 Hypertensive heart disease with heart failure: Secondary | ICD-10-CM | POA: Diagnosis not present

## 2016-02-29 DIAGNOSIS — N189 Chronic kidney disease, unspecified: Secondary | ICD-10-CM

## 2016-02-29 NOTE — Progress Notes (Signed)
MRN: [id number] Name: [name] [name]  Sex: male Age: 75 y.o. DOB: 1940/12/29  [facility] #: [facility] Facility/Room:127-A Level Of Care: SNF Provider:Alexander, Webb Silversmith MD  Emergency Contacts: Extended Emergency Contact Information Primary Emergency Contact: Geralyn Flash, [place] [address] Johnnette Litter of [place] Phone: [phone] Mobile Phone: [phone] Relation: Daughter  Code Status: FullCode  Allergies: Review of patient's allergies indicates no known allergies.  Chief Complaint  Patient presents with  . New Admit To SNF    HPI: Patient is 75 y.o. male with medical history significant of dementia, COPD, coronary artery disease, ischemic heart disease renal cell carcinoma OSA on oxygen as needed. Presented with1-week history of epigastric abdominal pain associated with bilateral lateral chest pain coughing, he has been having rapid respirations. Pt was admitted to [facility] from 5/2-9 where he was dx and treated for CAP. Hospital course was complicated by abdominal pain with neg w/u and acute on CKD that resolved with IVF. PT is admitted to SNF for generalized weakness for OT/PT. While at SNF pt will be followed for HTN, tx with metoprolol, COPD, tx with dulera and prn alb and atrovent and CAD, tx with metoprolol and plavix.  Past Medical History  Diagnosis Date  . Other congenital hamartoses, not elsewhere classified   . Family history of ischemic heart disease   . Family history of malignant neoplasm of gastrointestinal tract   . Allergic rhinitis, cause unspecified   . Other and unspecified hyperlipidemia   . Unspecified essential hypertension   . Obesity hypoventilation syndrome ([place])     failed cpap/bipap trial  . Hypothyroidism   . GERD (gastroesophageal reflux disease)   . Arthritis     osteoarthritis  . DEMENTIA     Alzheimer's, senile dementia  . Shortness of breath   . Cancer ([place])     renal carcinoma  . Sleep apnea     OXYGEN 24 HR/DAY-NO CPAP   . COPD (chronic obstructive pulmonary disease) (HCC)     [name] FOLLOWS PT--PT USING OXYGEN 24 HRS A DAY  . COPD (chronic obstructive pulmonary disease) (HCC)     PULMONARY CLEARANCE FOR LEFT RENAL CRYOABLATION ON CHART FROM DR. CLANCE -07/26/11  . Coronary artery disease     CARDIAC CLEARANCE  OFFICE NOTE FOR LEFT CRYO ABLATION ON CHART FROM [name] -"RECENT STRESS TEST DID NOT SHOW ISCHEMIA" -"PRESUMED CAD DUE TO CORONARY CALCIFICATIONS"   . Myocardial infarction ([place])   . Pneumonia     HX       Past Surgical History  Procedure Laterality Date  . Eye surgery      bilateral cataracts  . Joint replacement      bilateral knee replacements  . Carpal tunnel release      right  . Thyroidectomy    . Cholecystectomy  2009  . Knee arthroplasty      2005 LEFT   AND 2007 RIGHT--[name]  . Tumor removal  09/14/2011      Medication List       This list is accurate as of: 02/29/16 11:59 PM.  Always use your most recent med list.               acetaminophen 500 MG tablet  Commonly known as:  TYLENOL  Take 1,000 mg by mouth 2 (two) times daily.     albuterol (2.5 MG/3ML) 0.083% nebulizer solution  Commonly known as:  PROVENTIL  Take 2.5 mg by nebulization  every 6 (six) hours as needed for wheezing or shortness of breath.     ALPRAZolam 0.25 MG tablet  Commonly known as:  XANAX  Take 1 tablet (0.25 mg total) by mouth 2 (two) times daily as needed for anxiety.     amoxicillin-clavulanate 875-125 MG tablet  Commonly known as:  AUGMENTIN  Take 1 tablet by mouth every 12 (twelve) hours. For 3days     calcitRIOL 0.5 MCG capsule  Commonly known as:  ROCALTROL  Take 0.5 mcg by mouth daily.     calcium carbonate 500 MG chewable tablet  Commonly known as:  TUMS - dosed in mg elemental calcium  Chew 2 tablets by mouth daily as needed for indigestion or heartburn.     carboxymethylcellulose 1 % ophthalmic solution  Place 1 drop into both eyes 2 (two) times daily.      clopidogrel 75 MG tablet  Commonly known as:  PLAVIX  Take 75 mg by mouth every morning.     cyanocobalamin 500 MCG tablet  Take 500 mcg by mouth daily.     cycloSPORINE 0.05 % ophthalmic emulsion  Commonly known as:  RESTASIS  Place 1 drop into both eyes 2 (two) times daily.     DIABETIC TUSSIN DM MAX ST PO  Take 5 mLs by mouth 2 (two) times daily as needed (for cough).     DULERA 100-5 MCG/ACT Aero  Generic drug:  mometasone-formoterol  Inhale 2 puffs into the lungs 2 (two) times daily.     fluticasone 50 MCG/ACT nasal spray  Commonly known as:  FLONASE  Place 1 spray into the nose daily as needed for allergies.     HYDROcodone-acetaminophen 5-325 MG tablet  Commonly known as:  NORCO/VICODIN  Take 1 tablet by mouth every 6 (six) hours as needed for moderate pain.     ipratropium 0.02 % nebulizer solution  Commonly known as:  ATROVENT  Take 0.5 mg by nebulization every 6 (six) hours as needed for wheezing or shortness of breath.     levothyroxine 25 MCG tablet  Commonly known as:  SYNTHROID, LEVOTHROID  Take 25 mcg by mouth daily before breakfast.     levothyroxine 200 MCG tablet  Commonly known as:  SYNTHROID, LEVOTHROID  Take 200 mcg by mouth daily.     metoprolol tartrate 25 MG tablet  Commonly known as:  LOPRESSOR  Take 12.5 mg by mouth 2 (two) times daily. Hold for systolic blood pressure [id number]     OXYGEN  Inhale into the lungs at bedtime.     polyethylene glycol powder powder  Commonly known as:  GLYCOLAX/MIRALAX  Take 1 Container by mouth 2 (two) times daily. Mix 1 capful (17g) in 8 ounces of fluid and drink     senna-docusate 8.6-50 MG tablet  Commonly known as:  Senokot-S  Take 1 tablet by mouth 2 (two) times daily.     tamsulosin 0.4 MG Caps capsule  Commonly known as:  FLOMAX  Take 0.4 mg by mouth daily. Hold if SBP < 100     traMADol 50 MG tablet  Commonly known as:  ULTRAM  Take 100 mg by mouth 2 (two) times daily.     traZODone 150 MG  tablet  Commonly known as:  DESYREL  Take 300 mg by mouth at bedtime.        No orders of the defined types were placed in this encounter.    Immunization History  Administered Date(s) Administered  . Influenza Split 01/18/2012  .  Pneumococcal Polysaccharide-23 01/18/2012    Social History  Substance Use Topics  . Smoking status: Never Smoker   . Smokeless tobacco: Never Used  . Alcohol Use: No    Family history is + HD, CA  Review of Systems   UTO 2/2 dementia; nursng without concerns    Filed Vitals:   02/29/16 0934  BP: 122/68  Pulse: 74  Temp: 98.2 F (36.8 C)  Resp: 20    SpO2 Readings from Last 1 Encounters:  02/29/16 95%        Physical Exam  GENERAL APPEARANCE: Alert,  No acute distress.  SKIN: No diaphoresis rash HEAD: Normocephalic, atraumatic  EYES: Conjunctiva/lids clear. Pupils round, reactive. EOMs intact.  EARS: External exam WNL, canals clear. Hearing grossly normal.  NOSE: No deformity or discharge.  MOUTH/THROAT: Lips w/o lesions  RESPIRATORY: Breathing is even, unlabored. Lung sounds are diffely decreased   CARDIOVASCULAR: Heart RRR no murmurs, rubs or gallops. No peripheral edema.   GASTROINTESTINAL: Abdomen is soft, non-tender, not distended w/ normal bowel sounds. GENITOURINARY: Bladder non tender, not distended  MUSCULOSKELETAL: No abnormal joints or musculature NEUROLOGIC:  Cranial nerves 2-12 grossly intact. Moves all extremities  PSYCHIATRIC: Mood and affect appropriate to situation with dementia, no behavioral issues  Patient Active Problem List   Diagnosis Date Noted  . Chronic diastolic CHF (congestive heart failure) ([place]) 03/03/2016  . Coronary artery disease 03/03/2016  . Thoracic aortic aneurysm ([place]) 03/03/2016  . Dementia 02/23/2016  . CAP (community acquired pneumonia) 02/23/2016  . COPD (chronic obstructive pulmonary disease) ([place]) 02/23/2016  . Cellulitis, leg 02/23/2016  . Oliguria 02/23/2016  . Respiratory  alkalosis 02/23/2016  . Chest pain 02/23/2016  . Acute on chronic renal failure ([place]) 02/23/2016  . Chronic respiratory failure with hypoxia and hypercapnia ([place]) 09/27/2015  . Morbid obesity ([place]) 07/13/2015  . Dyspnea 07/12/2015  . Healthcare-associated pneumonia 01/17/2012  . OSA (obstructive sleep apnea) 11/15/2011  . Renal mass, left 09/14/2011  . DISORDERS OF DIAPHRAGM 07/02/2010  . OBESITY HYPOVENTILATION SYNDROME 02/14/2009  . Multiple pulmonary nodules 02/14/2009  . CHOLELITHIASIS 05/04/2008  . Nonspecific (abnormal) findings on radiological and other examination of body structure 04/28/2008  . ABNORMAL CHEST XRAY 04/28/2008  . 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 HYPOPARATHYROIDISM 03/31/2008  . HYPOCALCEMIA 03/31/2008  . 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 DYSLIPIDEMIA 09/21/2007  . Hypertensive heart disease with CHF (congestive heart failure) ([place]) 09/21/2007  . Allergic rhinitis 09/21/2007    CBC    Component Value Date/Time   WBC 6.9 02/27/2016 0457   RBC 3.92* 02/27/2016 0457   RBC 3.03* 12/09/2009 0610   HGB 12.8* 02/27/2016 0457   HCT 39.0 02/27/2016 0457   PLT 225 02/27/2016 0457   MCV 99.5 02/27/2016 0457   LYMPHSABS 1.0 02/23/2016 1422   MONOABS 0.7 02/23/2016 1422   EOSABS 0.1 02/23/2016 1422   BASOSABS 0.0 02/23/2016 1422    CMP     Component Value Date/Time   NA 140 02/27/2016 0457   K 4.8 02/27/2016 0457   CL 101 02/27/2016 0457   CO2 29 02/27/2016 0457   GLUCOSE 93 02/27/2016 0457   BUN 31* 02/27/2016 0457   CREATININE 1.51* 02/27/2016 0457   CALCIUM 7.2* 02/27/2016 0457   CALCIUM 9.5 03/19/2011 1344   PROT 6.5 02/26/2016 0513   ALBUMIN 3.7 02/26/2016 0513   AST 24 02/26/2016 0513   ALT 16* 02/26/2016 0513   ALKPHOS 63 02/26/2016 0513   BILITOT 0.3 02/26/2016 0513   GFRNONAA 43*  02/27/2016 0457   GFRAA 50* 02/27/2016 0457    Lab Results  Component Value Date   HGBA1C 5.9* 02/24/2016     Dg Chest 2  View  02/23/2016  CLINICAL DATA:  Shortness of breath. History of renal cell carcinoma EXAM: CHEST  2 VIEW COMPARISON:  February 15, 2016 FINDINGS: There is no edema or consolidation. Heart is mildly enlarged with pulmonary vascularity within normal limits. No adenopathy. There is evidence of a total shoulder replacement on the right. There is degenerative change in each shoulder as well as in the thoracic spine. IMPRESSION: Stable cardiac prominence.  No edema or consolidation. Electronically Signed   By: Lowella Grip III M.D.   On: 02/23/2016 15:24   Ct Angio Chest Pe W/cm &/or Wo Cm  02/23/2016  CLINICAL DATA:  Shortness of breath and chest pain EXAM: CT ANGIOGRAPHY CHEST WITH CONTRAST TECHNIQUE: Multidetector CT imaging of the chest was performed using the standard protocol during bolus administration of intravenous contrast. Multiplanar CT image reconstructions and MIPs were obtained to evaluate the vascular anatomy. CONTRAST:  100 mL Isovue 370 nonionic COMPARISON:  Chest CT January 16, 2012; chest radiograph Feb 23, 2016 FINDINGS: Mediastinum/Lymph Nodes: There is no demonstrable pulmonary embolus. There is prominence of the ascending thoracic aorta with a maximum transverse diameter of 4.3 x 4.0 cm. There is no thoracic aortic dissection. The visualized great vessels appear unremarkable. There is minimal pericardial effusion adjacent to the left ventricle superiorly. There are scattered foci of coronary artery calcification. There is no appreciable thoracic adenopathy. Lungs/Pleura: There is airspace consolidation in the lung bases bilaterally, primarily in the posterior and lateral segments of the left lower lobe and in the lateral segment of the right lower lobe. Mild atelectasis is also noted in the lung bases. Upper abdomen: Visualized upper abdominal structures appear unremarkable. Musculoskeletal: There is degenerative change in the thoracic spine noted. There are no blastic or lytic bone lesions.  There is a total shoulder replacement on the right with artifact from this metallic prosthesis. Review of the MIP images confirms the above findings. IMPRESSION: No demonstrable pulmonary embolus. Bibasilar pneumonia, slightly more on the left than on the right. Prominence of the ascending thoracic aorta with a maximum transverse diameter of 4.3 x 4.0 cm. Recommend annual imaging followup by CTA or MRA. This recommendation follows 2010 ACCF/AHA/AATS/ACR/ASA/SCA/SCAI/SIR/STS/SVM Guidelines for the Diagnosis and Management of Patients with Thoracic Aortic Disease. Circulation. 2010; [id number]. No aortic dissection seen. No appreciable thoracic adenopathy. Electronically Signed   By: Lowella Grip III M.D.   On: 02/23/2016 18:02   US Abdomen Complete  02/24/2016  CLINICAL DATA:  One week history of abdominal pain and decreased urine output EXAM: ABDOMEN ULTRASOUND COMPLETE COMPARISON:  CT abdomen and pelvis February 15, 2016 and July 10, 2015 FINDINGS: Gallbladder:  Surgically absent. Common bile duct: Diameter: 6 mm. There is no intrahepatic, common hepatic, or common bile duct dilatation. Liver: No focal lesion identified.  Liver echogenicity is increased. IVC: No abnormality visualized in visualized regions. Portions of the inferior vena cava are obscured by gas. Pancreas: Essentially completely obscured by gas. Spleen: Size and appearance within normal limits. Right Kidney: Length: 12.0 cm. Echogenicity within normal limits. There is diffuse renal cortical thinning. No mass or hydronephrosis visualized. Left Kidney: Length: 0.9 cm. Echogenicity within normal limits. There is diffuse renal cortical thinning. No hydronephrosis visualized. There is a complex mass arising from the lower pole left kidney measuring 5.5 x 4.5 x 6.1 cm. There  is a nonobstructing 4 mm calculus in the upper to mid left kidney. Abdominal aorta: No aneurysm visualized. Other findings: No apparent ascites. IMPRESSION: Mass arising  from the lower pole left kidney, complex. By report, the patient is undergone prior cryoablation in this area. In comparison with the prior CT, some of the complexity of the current lesion is due to scarring and fat surrounding a more centrally masslike area. It should be noted that a degree of recurrent neoplasm in this lesion cannot be excluded. Further assessment is felt to be warranted in this regard. Ideally, renal MR pre and post-contrast would be the imaging study of choice to further assess. Nonobstructing 4 mm calculus upper to mid left kidney. Gallbladder absent. Pancreas obscured by gas. Increased liver echogenicity, a finding most likely indicative of hepatic steatosis. While no focal liver lesions are identified, it must be cautioned that the sensitivity of ultrasound for focal liver lesions is diminished in this circumstance. Renal cortical thinning bilaterally, a finding that may be indicative of a degree of underlying medical renal disease. Electronically Signed   By: Lowella Grip III M.D.   On: 02/24/2016 09:08    Not all labs, radiology exams or other studies done during hospitalization come through on my EPIC note; however they are reviewed by me.    Assessment and Plan  No problem-specific assessment & plan notes found for this encounter. Community acquired pneumonia -s/p3days of Rocephin and azithromycin -completed a course of Levaquin 1 week prior to admission -Blood Cx negative, sputum pending -SLP eval completed, no s/s of ongoing aspiration -urine pneumo Ag negative -repeat CXR due to persistent complaints-unremarkable, mostly clear only atelectasis SNF -  Cont Augmentin for 3 more days   Acute on Chronic diastolic CHF and H/o CAD -ECHo with preserved EF, grade 1 DD suspect overhydrated with NS on admission, given single dose of IV lasix, has not required diuretics after that, not on diuretics chronically SNF - controlled on lopressor, not on diuretics, no longer  needed  Numerous somatic complaints -noted throughout hospitalization, d/w Daughter, this is chronic and he has constant complaints all the time despite stability and no objective findings of acute illness or decompensation, continue reassurance -daughter will notify the facility about his behavior so that frequent Trips to the ER can be limited to when actually needed only  Abd pain/both sides -[place] on admission was notable for Known L renal scarring h/o RCC -due to constant reports of pain,  CT scan done  which was without acute findings, except stool and stable L renal lesion, s/p Cryoablation  -he is afebrile, and tolerating all his meals and having bowel movements despite his symptoms   COPD (chronic obstructive pulmonary disease)  -currently stable, nebs PRN SNF - cont dulera, scheduled, prn alb and atrovent  . 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 Dementia  SNF - stable, with anxiety and requires a lot of reassurance, cont xanax prn  . Essential hypertension  SNF - controlled on metoprolol 12.5 mg BID; will monitor daily   .  OSA (obstructive sleep apnea) - not on C Pap continue oxygen only as needed at night SNF - will cont O2 at night   CAD  SNF - cont metoprolol and plavix  HYPOTHYROIDISM SNF - not stated as uncontrolled; cont synthroid 225 mcg daily   Time spent > 45 min;> 50% of time with patient was spent reviewing records, labs, tests and studies, counseling and developing plan of care  Inocencio Homes MD

## 2016-03-03 ENCOUNTER — Encounter: Payer: Self-pay | Admitting: Internal Medicine

## 2016-03-03 DIAGNOSIS — I5032 Chronic diastolic (congestive) heart failure: Secondary | ICD-10-CM | POA: Insufficient documentation

## 2016-03-03 DIAGNOSIS — I712 Thoracic aortic aneurysm, without rupture, unspecified: Secondary | ICD-10-CM | POA: Insufficient documentation

## 2016-03-03 DIAGNOSIS — I2510 Atherosclerotic heart disease of native coronary artery without angina pectoris: Secondary | ICD-10-CM | POA: Insufficient documentation

## 2016-03-13 ENCOUNTER — Non-Acute Institutional Stay (SKILLED_NURSING_FACILITY): Payer: Medicare Other | Admitting: Adult Health

## 2016-03-13 ENCOUNTER — Encounter: Payer: Self-pay | Admitting: Adult Health

## 2016-03-13 ENCOUNTER — Emergency Department (HOSPITAL_COMMUNITY): Payer: Medicare Other

## 2016-03-13 ENCOUNTER — Emergency Department (HOSPITAL_COMMUNITY)
Admission: EM | Admit: 2016-03-13 | Discharge: 2016-03-13 | Disposition: A | Discharge status: Home / Self Care | Payer: Medicare Other | Attending: Emergency Medicine | Admitting: Emergency Medicine

## 2016-03-13 DIAGNOSIS — R140 Abdominal distension (gaseous): Secondary | ICD-10-CM | POA: Diagnosis not present

## 2016-03-13 DIAGNOSIS — Z79891 Long term (current) use of opiate analgesic: Secondary | ICD-10-CM | POA: Diagnosis not present

## 2016-03-13 DIAGNOSIS — Z96653 Presence of artificial knee joint, bilateral: Secondary | ICD-10-CM | POA: Diagnosis not present

## 2016-03-13 DIAGNOSIS — Z7951 Long term (current) use of inhaled steroids: Secondary | ICD-10-CM | POA: Diagnosis not present

## 2016-03-13 DIAGNOSIS — I252 Old myocardial infarction: Secondary | ICD-10-CM | POA: Insufficient documentation

## 2016-03-13 DIAGNOSIS — I110 Hypertensive heart disease with heart failure: Secondary | ICD-10-CM

## 2016-03-13 DIAGNOSIS — Z79899 Other long term (current) drug therapy: Secondary | ICD-10-CM | POA: Diagnosis not present

## 2016-03-13 DIAGNOSIS — K5660 Unspecified intestinal obstruction: Secondary | ICD-10-CM | POA: Diagnosis present

## 2016-03-13 DIAGNOSIS — I2510 Atherosclerotic heart disease of native coronary artery without angina pectoris: Secondary | ICD-10-CM | POA: Insufficient documentation

## 2016-03-13 DIAGNOSIS — G309 Alzheimer's disease, unspecified: Secondary | ICD-10-CM | POA: Diagnosis not present

## 2016-03-13 DIAGNOSIS — J410 Simple chronic bronchitis: Secondary | ICD-10-CM | POA: Diagnosis not present

## 2016-03-13 DIAGNOSIS — I5032 Chronic diastolic (congestive) heart failure: Secondary | ICD-10-CM

## 2016-03-13 DIAGNOSIS — J449 Chronic obstructive pulmonary disease, unspecified: Secondary | ICD-10-CM | POA: Diagnosis not present

## 2016-03-13 DIAGNOSIS — M1990 Unspecified osteoarthritis, unspecified site: Secondary | ICD-10-CM | POA: Insufficient documentation

## 2016-03-13 DIAGNOSIS — Z85528 Personal history of other malignant neoplasm of kidney: Secondary | ICD-10-CM | POA: Diagnosis not present

## 2016-03-13 LAB — CBC WITH DIFFERENTIAL/PLATELET
BASOS ABS: 0 10*3/uL (ref 0.0–0.1)
BASOS PCT: 0 %
Eosinophils Absolute: 0.2 10*3/uL (ref 0.0–0.7)
Eosinophils Relative: 2 %
HEMATOCRIT: 40.1 % (ref 39.0–52.0)
HEMOGLOBIN: 12.9 g/dL — AB (ref 13.0–17.0)
Lymphocytes Relative: 15 %
Lymphs Abs: 1.2 10*3/uL (ref 0.7–4.0)
MCH: 32.2 pg (ref 26.0–34.0)
MCHC: 32.2 g/dL (ref 30.0–36.0)
MCV: 100 fL (ref 78.0–100.0)
Monocytes Absolute: 0.5 10*3/uL (ref 0.1–1.0)
Monocytes Relative: 6 %
NEUTROS ABS: 6 10*3/uL (ref 1.7–7.7)
NEUTROS PCT: 77 %
Platelets: 155 10*3/uL (ref 150–400)
RBC: 4.01 MIL/uL — AB (ref 4.22–5.81)
RDW: 13.9 % (ref 11.5–15.5)
WBC: 7.9 10*3/uL (ref 4.0–10.5)

## 2016-03-13 LAB — COMPREHENSIVE METABOLIC PANEL
ALBUMIN: 3.6 g/dL (ref 3.5–5.0)
ALT: 15 U/L — ABNORMAL LOW (ref 17–63)
ANION GAP: 11 (ref 5–15)
AST: 31 U/L (ref 15–41)
Alkaline Phosphatase: 69 U/L (ref 38–126)
BILIRUBIN TOTAL: 0.3 mg/dL (ref 0.3–1.2)
BUN: 25 mg/dL — ABNORMAL HIGH (ref 6–20)
CHLORIDE: 97 mmol/L — AB (ref 101–111)
CO2: 27 mmol/L (ref 22–32)
Calcium: 8.6 mg/dL — ABNORMAL LOW (ref 8.9–10.3)
Creatinine, Ser: 1.44 mg/dL — ABNORMAL HIGH (ref 0.61–1.24)
GFR calc Af Amer: 53 mL/min — ABNORMAL LOW (ref >60)
GFR, EST NON AFRICAN AMERICAN: 46 mL/min — AB (ref >60)
Glucose, Bld: 123 mg/dL — ABNORMAL HIGH (ref 65–99)
POTASSIUM: 4.7 mmol/L (ref 3.5–5.1)
Sodium: 135 mmol/L (ref 135–145)
TOTAL PROTEIN: 6.3 g/dL — AB (ref 6.5–8.1)

## 2016-03-13 LAB — URINALYSIS, ROUTINE W REFLEX MICROSCOPIC
Bilirubin Urine: NEGATIVE
Glucose, UA: NEGATIVE mg/dL
Hgb urine dipstick: NEGATIVE
Ketones, ur: NEGATIVE mg/dL
NITRITE: NEGATIVE
PH: 7 (ref 5.0–8.0)
Protein, ur: NEGATIVE mg/dL
SPECIFIC GRAVITY, URINE: 1.025 (ref 1.005–1.030)

## 2016-03-13 LAB — LIPASE, BLOOD: LIPASE: 21 U/L (ref 11–51)

## 2016-03-13 LAB — URINE MICROSCOPIC-ADD ON

## 2016-03-13 MED ORDER — SODIUM CHLORIDE 0.9 % IV SOLN: INTRAVENOUS | Status: DC

## 2016-03-13 MED ORDER — SODIUM CHLORIDE 0.9 % IV BOLUS (SEPSIS)
500.0000 mL | Freq: Once | INTRAVENOUS | Status: AC
  Administered 2016-03-13: 500 mL via INTRAVENOUS

## 2016-03-13 MED ORDER — DIATRIZOATE MEGLUMINE & SODIUM 66-10 % PO SOLN: 15.0000 mL | Freq: Once | ORAL | Status: DC | PRN

## 2016-03-13 MED ORDER — IOPAMIDOL (ISOVUE-300) INJECTION 61%
100.0000 mL | Freq: Once | INTRAVENOUS | Status: AC | PRN
  Administered 2016-03-13: 100 mL via INTRAVENOUS

## 2016-03-13 NOTE — ED Notes (Addendum)
[facility] arrived. Pt discharged back to facility with [facility]

## 2016-03-13 NOTE — Progress Notes (Signed)
Location:  [facility] Room Number: [id number] Place of Service:  SNF (31)    CODE STATUS: Full Code  No Known Allergies  Chief Complaint  Patient presents with  . Discharge Note    Discharge from facility    HPI:  He is being discharged to assisted living with home health for pt/ot/rn. He will need a standard wheelchair with cushion; anti-tippers; leg rests and brake extensions. He is on long term 02 at 3 liters; has been on 02 prior to his hospitalization. He will need his prescriptions written and will follow up with the medical provider at the assisted living. He had been hospitalized for pneumonia; was admitted to this facility for short term rehab and is now ready to return back to his assisted living.    Past Medical History  Diagnosis Date  . Other congenital hamartoses, not elsewhere classified   . Family history of ischemic heart disease   . Family history of malignant neoplasm of gastrointestinal tract   . Allergic rhinitis, cause unspecified   . Other and unspecified hyperlipidemia   . Unspecified essential hypertension   . Obesity hypoventilation syndrome ([place])     failed cpap/bipap trial  . Hypothyroidism   . GERD (gastroesophageal reflux disease)   . Arthritis     osteoarthritis  . DEMENTIA     Alzheimer's, senile dementia  . Shortness of breath   . Cancer ([place])     renal carcinoma  . Sleep apnea     OXYGEN 24 HR/DAY-NO CPAP  . COPD (chronic obstructive pulmonary disease) (HCC)     [name] FOLLOWS PT--PT USING OXYGEN 24 HRS A DAY  . COPD (chronic obstructive pulmonary disease) (HCC)     PULMONARY CLEARANCE FOR LEFT RENAL CRYOABLATION ON CHART FROM DR. CLANCE -07/26/11  . Coronary artery disease     CARDIAC CLEARANCE  OFFICE NOTE FOR LEFT CRYO ABLATION ON CHART FROM [name] -"RECENT STRESS TEST DID NOT SHOW ISCHEMIA" -"PRESUMED CAD DUE TO CORONARY CALCIFICATIONS"   . Myocardial infarction ([place])   . Pneumonia    HX       Past Surgical History  Procedure Laterality Date  . Eye surgery      bilateral cataracts  . Joint replacement      bilateral knee replacements  . Carpal tunnel release      right  . Thyroidectomy    . Cholecystectomy  2009  . Knee arthroplasty      2005 LEFT   AND 2007 RIGHT--[name]  . Tumor removal  09/14/2011    Social History   Social History  . Marital Status: Widowed    Spouse Name: N/A  . Number of Children: Y  . Years of Education: N/A   Occupational History  . retired    Social History Main Topics  . Smoking status: Never Smoker   . Smokeless tobacco: Never Used  . Alcohol Use: No  . Drug Use: No  . Sexual Activity: No   Other Topics Concern  . Not on file   Social History Narrative   Lives at [facility]   Family History  Problem Relation Age of Onset  . Cancer Other     colon  . Heart disease Other     CAD, MI and Heart Attack    VITAL SIGNS BP 118/68 mmHg  Pulse 74  Temp(Src) 98.2 F (36.8 C) (Oral)  Resp 20  Ht 6\' 2"  (1.88 m)  Wt 271  lb 4 oz (123.038 kg)  BMI 34.81 kg/m2  SpO2 96%  Patient's Medications  New Prescriptions   No medications on file  Previous Medications   ACETAMINOPHEN (TYLENOL) 500 MG TABLET    Take 1,000 mg by mouth 2 (two) times daily.   ALBUTEROL (PROVENTIL) (2.5 MG/3ML) 0.083% NEBULIZER SOLUTION    Take 2.5 mg by nebulization every 6 (six) hours as needed for wheezing or shortness of breath.   ALPRAZOLAM (XANAX) 0.25 MG TABLET    Take 1 tablet (0.25 mg total) by mouth 2 (two) times daily as needed for anxiety.   BISMUTH TRIBROMOPH-PETROLATUM (XEROFORM PETROLAT GAUZE 1"X8" EX)    Apply topically. Apply to left and right lower leg every day   CALCITRIOL (ROCALTROL) 0.5 MCG CAPSULE    Take 0.5 mcg by mouth daily.   CALCIUM CARBONATE (TUMS - DOSED IN MG ELEMENTAL CALCIUM) 500 MG CHEWABLE TABLET    Chew 2 tablets by mouth daily as needed for indigestion or heartburn.   CARBOXYMETHYLCELLULOSE 1 %  OPHTHALMIC SOLUTION    Place 1 drop into both eyes 2 (two) times daily.   CLOPIDOGREL (PLAVIX) 75 MG TABLET    Take 75 mg by mouth every morning.    CYANOCOBALAMIN 500 MCG TABLET    Take 500 mcg by mouth daily.   DEXTROMETHORPHAN-GUAIFENESIN (DIABETIC TUSSIN DM MAX ST PO)    Take 5 mLs by mouth 2 (two) times daily as needed (for cough).   FLUTICASONE (FLONASE) 50 MCG/ACT NASAL SPRAY    Place 1 spray into the nose daily as needed for allergies.    HYDROCODONE-ACETAMINOPHEN (NORCO/VICODIN) 5-325 MG TABLET    Take 1 tablet by mouth every 6 (six) hours as needed for moderate pain.   IPRATROPIUM (ATROVENT) 0.02 % NEBULIZER SOLUTION    Take 0.5 mg by nebulization every 6 (six) hours as needed for wheezing or shortness of breath.   LEVOTHYROXINE (SYNTHROID, LEVOTHROID) 200 MCG TABLET    Take 200 mcg by mouth daily. Reported on 03/13/2016   LEVOTHYROXINE (SYNTHROID, LEVOTHROID) 25 MCG TABLET    Take 25 mcg by mouth daily before breakfast.   METOPROLOL TARTRATE (LOPRESSOR) 25 MG TABLET    Take 12.5 mg by mouth 2 (two) times daily. Hold for systolic blood pressure 99991111   MOMETASONE-FORMOTEROL (DULERA) 100-5 MCG/ACT AERO    Inhale 2 puffs into the lungs 2 (two) times daily.   OXYGEN    Inhale into the lungs at bedtime.   POLYETHYLENE GLYCOL POWDER (GLYCOLAX/MIRALAX) POWDER    Take 1 Container by mouth 2 (two) times daily. Mix 1 capful (17g) in 8 ounces of fluid and drink   SENNA-DOCUSATE (SENOKOT-S) 8.6-50 MG TABLET    Take 1 tablet by mouth 2 (two) times daily.   TAMSULOSIN HCL (FLOMAX) 0.4 MG CAPS    Take 0.4 mg by mouth daily. Hold if SBP < 100   TRAMADOL (ULTRAM) 50 MG TABLET    Take 100 mg by mouth 2 (two) times daily.   TRAZODONE (DESYREL) 150 MG TABLET    Take 300 mg by mouth at bedtime.  Modified Medications   No medications on file  Discontinued Medications   AMOXICILLIN-CLAVULANATE (AUGMENTIN) 875-125 MG TABLET    Take 1 tablet by mouth every 12 (twelve) hours. For 3days   CYCLOSPORINE (RESTASIS)  0.05 % OPHTHALMIC EMULSION    Place 1 drop into both eyes 2 (two) times daily. Reported on 03/13/2016     SIGNIFICANT DIAGNOSTIC EXAMS   LABS REVIEWED;   02-27-16:  wbc 6.9; hgb 12.8; hct 39.0; mcv 99.5; plt 225; glucose 93; bun 31; creat 1.51; k+ 4.8; na++140    Review of Systems  Constitutional: Negative for malaise/fatigue.  Respiratory: Negative for cough and shortness of breath.   Cardiovascular: Negative for chest pain, palpitations and leg swelling.  Gastrointestinal: Positive for abdominal pain and diarrhea. Negative for heartburn, nausea and constipation.       Has upper quad pain is achy in nature   Musculoskeletal: Negative for myalgias, back pain and joint pain.  Skin: Negative.   Neurological: Negative for dizziness.  Psychiatric/Behavioral: The patient is not nervous/anxious.     Physical Exam  Constitutional: He is oriented to person, place, and time. He appears well-developed and well-nourished. No distress.  Obese   Eyes: Conjunctivae are normal.  Neck: Neck supple. No JVD present. No thyromegaly present.  Cardiovascular: Normal rate, regular rhythm and intact distal pulses.   Respiratory: Effort normal and breath sounds normal. No respiratory distress. He has no wheezes.  GI: Soft. Bowel sounds are normal. He exhibits no distension. There is tenderness.  Upper quad tenderness is mild   Musculoskeletal: He exhibits no edema.  Able to move all extremities   Lymphadenopathy:    He has no cervical adenopathy.  Neurological: He is alert and oriented to person, place, and time.  Skin: Skin is warm and dry. He is not diaphoretic.  Psychiatric: He has a normal mood and affect.      ASSESSMENT/ PLAN:   Patient is being discharged with the following home health services:  Pt/ot/rn to evaluate and treat as indicated for gait; balance; strength; adl training; wound care.   Patient is being discharged with the following durable medical equipment:  Requires use of  standard wheelchair with cushion; leg rests; anti-tippers; brake extensions in order for him to maintain his current level of independence with his adl's which cannot be achieved with a walker. He can self propel.   Patient has been advised to f/u with their PCP in 1-2 weeks to bring them up to date on their rehab stay.  Social services at facility was responsible for arranging this appointment.  Pt was provided with a 30 day supply of prescriptions for medications and refills must be obtained from their PCP.  For controlled substances, a more limited supply may be provided adequate until PCP appointment only. #60 xanax 0.25 mg tabs; #30 vicodin 5/325 mg tabs; #120 ultram 50 mg tabs   Will order chest x-ray and kub    Time spent with patient  45  minutes >50% time spent counseling; reviewing medical record; tests; labs; and developing future plan of care    Ok Edwards NP [facility]  Contact [phone] Monday through Friday 8am- 5pm  After hours call [phone]

## 2016-03-13 NOTE — ED Provider Notes (Signed)
CSN: [id number]     Arrival date & time 03/13/16  1845 History   First MD Initiated Contact with Patient 03/13/16 1918     Chief Complaint  Patient presents with  . Abdominal Pain    bowel obstruction     (Consider location/radiation/quality/duration/timing/severity/associated sxs/prior Treatment) HPI   [name] [name] is a 75 y.o. male who is here for evaluation of "bowel obstruction". He reports having routine x-rays done today at his care facility, and was told after that the he had a bowel obstruction. He states that he has been eating well, and had a normal bowel movement today. He denies nausea, vomiting, abdominal pain, back pain, weakness or dizziness. He has been told that he has a "hemorrhoid" in his umbilicus. There are no other no modifying factors.   Past Medical History  Diagnosis Date  . Other congenital hamartoses, not elsewhere classified   . Family history of ischemic heart disease   . Family history of malignant neoplasm of gastrointestinal tract   . Allergic rhinitis, cause unspecified   . Other and unspecified hyperlipidemia   . Unspecified essential hypertension   . Obesity hypoventilation syndrome ([place])     failed cpap/bipap trial  . Hypothyroidism   . GERD (gastroesophageal reflux disease)   . Arthritis     osteoarthritis  . DEMENTIA     Alzheimer's, senile dementia  . Shortness of breath   . Cancer ([place])     renal carcinoma  . Sleep apnea     OXYGEN 24 HR/DAY-NO CPAP  . COPD (chronic obstructive pulmonary disease) (HCC)     [name] FOLLOWS PT--PT USING OXYGEN 24 HRS A DAY  . COPD (chronic obstructive pulmonary disease) (HCC)     PULMONARY CLEARANCE FOR LEFT RENAL CRYOABLATION ON CHART FROM DR. CLANCE -07/26/11  . Coronary artery disease     CARDIAC CLEARANCE  OFFICE NOTE FOR LEFT CRYO ABLATION ON CHART FROM [name] -"RECENT STRESS TEST DID NOT SHOW ISCHEMIA" -"PRESUMED CAD DUE TO CORONARY CALCIFICATIONS"   . Myocardial infarction ([place])    . Pneumonia     HX      Past Surgical History  Procedure Laterality Date  . Eye surgery      bilateral cataracts  . Joint replacement      bilateral knee replacements  . Carpal tunnel release      right  . Thyroidectomy    . Cholecystectomy  2009  . Knee arthroplasty      2005 LEFT   AND 2007 RIGHT--[name]  . Tumor removal  09/14/2011   Family History  Problem Relation Age of Onset  . Cancer Other     colon  . Heart disease Other     CAD, MI and Heart Attack   Social History  Substance Use Topics  . Smoking status: Never Smoker   . Smokeless tobacco: Never Used  . Alcohol Use: No    Review of Systems  All other systems reviewed and are negative.     Allergies  Review of patient's allergies indicates no known allergies.  Home Medications   Prior to Admission medications   Medication Sig Start Date End Date Taking? Authorizing Provider  acetaminophen (TYLENOL) 500 MG tablet Take 1,000 mg by mouth 2 (two) times daily.   Yes Historical Provider, MD  ALPRAZolam (XANAX) 0.25 MG tablet Take 1 tablet (0.25 mg total) by mouth 2 (two) times daily as needed for anxiety. 02/27/16  Yes Domenic Polite, MD  calcitRIOL (  ROCALTROL) 0.5 MCG capsule Take 0.5 mcg by mouth daily.   Yes Historical Provider, MD  carboxymethylcellulose 1 % ophthalmic solution Place 1 drop into both eyes 2 (two) times daily.   Yes Historical Provider, MD  clopidogrel (PLAVIX) 75 MG tablet Take 75 mg by mouth every morning.    Yes Historical Provider, MD  cyanocobalamin 500 MCG tablet Take 500 mcg by mouth daily.   Yes Historical Provider, MD  HYDROcodone-acetaminophen (NORCO/VICODIN) 5-325 MG tablet Take 1 tablet by mouth every 6 (six) hours as needed for moderate pain. 02/27/16  Yes Domenic Polite, MD  levothyroxine (SYNTHROID, LEVOTHROID) 25 MCG tablet Take 25 mcg by mouth daily before breakfast.   Yes Historical Provider, MD  metoprolol tartrate (LOPRESSOR) 25 MG tablet Take 12.5 mg by mouth 2 (two)  times daily. Hold for systolic blood pressure 99991111   Yes Historical Provider, MD  mometasone-formoterol (DULERA) 100-5 MCG/ACT AERO Inhale 2 puffs into the lungs 2 (two) times daily.   Yes Historical Provider, MD  OXYGEN Inhale into the lungs at bedtime.   Yes Historical Provider, MD  polyethylene glycol powder (GLYCOLAX/MIRALAX) powder Take 1 Container by mouth 2 (two) times daily. Mix 1 capful (17g) in 8 ounces of fluid and drink   Yes Historical Provider, MD  senna-docusate (SENOKOT-S) 8.6-50 MG tablet Take 1 tablet by mouth 2 (two) times daily. 02/27/16  Yes Domenic Polite, MD  Tamsulosin HCl (FLOMAX) 0.4 MG CAPS Take 0.4 mg by mouth daily. Hold if SBP < 100   Yes Historical Provider, MD  traMADol (ULTRAM) 50 MG tablet Take 100 mg by mouth 2 (two) times daily.   Yes Historical Provider, MD  traZODone (DESYREL) 150 MG tablet Take 300 mg by mouth at bedtime.   Yes Historical Provider, MD  albuterol (PROVENTIL) (2.5 MG/3ML) 0.083% nebulizer solution Take 2.5 mg by nebulization every 6 (six) hours as needed for wheezing or shortness of breath. Reported on 03/13/2016    Historical Provider, MD  Bismuth Tribromoph-Petrolatum (XEROFORM PETROLAT GAUZE 1"X8" EX) Apply topically. Apply to left and right lower leg every day    Historical Provider, MD  calcium carbonate (TUMS - DOSED IN MG ELEMENTAL CALCIUM) 500 MG chewable tablet Chew 2 tablets by mouth daily as needed for indigestion or heartburn.    Historical Provider, MD  Dextromethorphan-Guaifenesin (DIABETIC TUSSIN DM MAX ST PO) Take 5 mLs by mouth 2 (two) times daily as needed (for cough).    Historical Provider, MD  fluticasone (FLONASE) 50 MCG/ACT nasal spray Place 1 spray into the nose daily as needed for allergies.     Historical Provider, MD  ipratropium (ATROVENT) 0.02 % nebulizer solution Take 0.5 mg by nebulization every 6 (six) hours as needed for wheezing or shortness of breath.    Historical Provider, MD   BP 113/60 mmHg  Pulse 69  Temp(Src)  98.2 F (36.8 C) (Oral)  Resp 22  SpO2 98% Physical Exam  Constitutional: He is oriented to person, place, and time. He appears well-developed and well-nourished.  HENT:  Head: Normocephalic and atraumatic.  Right Ear: External ear normal.  Left Ear: External ear normal.  Eyes: Conjunctivae and EOM are normal. Pupils are equal, round, and reactive to light.  Neck: Normal range of motion and phonation normal. Neck supple.  Cardiovascular: Normal rate, regular rhythm and normal heart sounds.   Pulmonary/Chest: Effort normal and breath sounds normal. No respiratory distress. He has no wheezes. He exhibits no bony tenderness.  Abdominal: Soft. He exhibits distension. He exhibits  no mass. There is no tenderness. There is no guarding.  High-pitched, and tinkling bowel sounds, all 4 quadrants. Umbilical mass, about 2 cm, and somewhat dark in color. This area is nontender to palpation.  Musculoskeletal: Normal range of motion. He exhibits edema (Lower extremity, 1-2+ bilateral).  Neurological: He is alert and oriented to person, place, and time. No cranial nerve deficit or sensory deficit. He exhibits normal muscle tone. Coordination normal.  Skin: Skin is warm, dry and intact.  Psychiatric: He has a normal mood and affect. His behavior is normal. Judgment and thought content normal.  Nursing note and vitals reviewed.   ED Course  Procedures (including critical care time)  Initial clinical impression- clinical exam concerning for bowel obstruction with history of abnormal plain images.  Medications  0.9 %  sodium chloride infusion (not administered)  diatrizoate meglumine-sodium (GASTROGRAFIN) 66-10 % solution 15 mL (not administered)  sodium chloride 0.9 % bolus 500 mL (500 mLs Intravenous New Bag/Given 03/13/16 2014)  iopamidol (ISOVUE-300) 61 % injection 100 mL (100 mLs Intravenous Contrast Given 03/13/16 2059)    Patient Vitals for the past 24 hrs:  BP Temp Temp src Pulse Resp SpO2   03/13/16 2119 113/60 mmHg - - 69 22 98 %  03/13/16 1903 113/62 mmHg 98.2 F (36.8 C) Oral 70 16 100 %    10:52 PM Reevaluation with update and discussion. After initial assessment and treatment, an updated evaluation reveals He remains comfortable. No vomiting. He states his abdomen does not hurt at this time.Daleen Bo L     Labs Review Labs Reviewed  COMPREHENSIVE METABOLIC PANEL - Abnormal; Notable for the following:    Chloride 97 (*)    Glucose, Bld 123 (*)    BUN 25 (*)    Creatinine, Ser 1.44 (*)    Calcium 8.6 (*)    Total Protein 6.3 (*)    ALT 15 (*)    GFR calc non Af Amer 46 (*)    GFR calc Af Amer 53 (*)    All other components within normal limits  CBC WITH DIFFERENTIAL/PLATELET - Abnormal; Notable for the following:    RBC 4.01 (*)    Hemoglobin 12.9 (*)    All other components within normal limits  URINALYSIS, ROUTINE W REFLEX MICROSCOPIC (NOT AT [facility]) - Abnormal; Notable for the following:    Leukocytes, UA MODERATE (*)    All other components within normal limits  URINE MICROSCOPIC-ADD ON - Abnormal; Notable for the following:    Squamous Epithelial / LPF 0-5 (*)    Bacteria, UA FEW (*)    Casts HYALINE CASTS (*)    All other components within normal limits  URINE CULTURE  LIPASE, BLOOD    Imaging Review Ct Abdomen Pelvis W Contrast  03/13/2016  CLINICAL DATA:  Acute onset of abdominal distention and tenderness. Shortness of breath. Initial encounter. EXAM: CT ABDOMEN AND PELVIS WITH CONTRAST TECHNIQUE: Multidetector CT imaging of the abdomen and pelvis was performed using the standard protocol following bolus administration of intravenous contrast. CONTRAST:  17mL ISOVUE-300 IOPAMIDOL (ISOVUE-300) INJECTION 61% COMPARISON:  CT of the abdomen and pelvis performed 02/27/2016 FINDINGS: Mild bibasilar airspace opacities may reflect atelectasis or pneumonia. Scattered coronary artery calcifications are seen. A few small vague hypodensities are seen within  the liver, measuring up to 7 mm in size. The spleen is unremarkable in appearance. The patient is status post cholecystectomy, with clips noted at the gallbladder fossa. The pancreas and adrenal glands are unremarkable in  appearance. A 6.5 cm large previously cryoablated mass is noted arising at the lower pole of the left kidney, with associated calcification. Additional minimal parenchymal calcification is seen at the left kidney. The right kidney is grossly unremarkable. Mild bilateral renal atrophy is noted. Mild nonspecific perinephric stranding is noted bilaterally. There is no evidence of hydronephrosis. No obstructing ureteral stones are seen. No free fluid is identified. The small bowel is unremarkable in appearance. The stomach is within normal limits. No acute vascular abnormalities are seen. There is herniation of a very short segment of small bowel into a small umbilical hernia, without evidence for obstruction or strangulation. The appendix is normal in caliber, without evidence of appendicitis. The colon is partially filled with stool. Scattered diverticulosis noted along the descending colon, without evidence of diverticulitis. The bladder is decompressed and not well assessed. The prostate remains normal in size, with scattered calcification. No inguinal lymphadenopathy is seen. No acute osseous abnormalities are identified. IMPRESSION: 1. No acute abnormality seen to explain the patient's symptoms. 2. Mild bibasilar airspace opacities may reflect atelectasis or mild pneumonia. 3. Herniation of a very short segment of small bowel into a small umbilical hernia, without evidence for obstruction or strangulation. 4. 6.5 cm large previously cryoablated mass at the lower pole of the left kidney, with associated calcification. This is stable from prior studies. 5. Few vague small hypodensities within the liver, measuring up to 7 mm in size. 6. Scattered coronary artery calcifications seen. 7. Mild  bilateral renal atrophy noted. 8. Scattered diverticulosis along the descending colon, without evidence of diverticulitis. Electronically Signed   By: Garald Balding M.D.   On: 03/13/2016 21:33   I have personally reviewed and evaluated these images and lab results as part of my medical decision-making.   EKG Interpretation None      MDM   Final diagnoses:  Abdominal distension    Nonspecific abdominal distention. No evidence for bowel, or suggestion for serious bacterial infection, metabolic instability or impending vascular collapse.  Nursing Notes Reviewed/ Care Coordinated Applicable Imaging Reviewed Interpretation of Laboratory Data incorporated into ED treatment  The patient appears reasonably screened and/or stabilized for discharge and I doubt any other medical condition or other [facility] requiring further screening, evaluation, or treatment in the ED at this time prior to discharge.  Plan: Home Medications- usual; Home Treatments- rest; return here if the recommended treatment, does not improve the symptoms; Recommended follow up- PCP prn     Daleen Bo, MD 03/13/16 2254

## 2016-03-13 NOTE — ED Notes (Signed)
PTAR called  

## 2016-03-13 NOTE — ED Notes (Addendum)
Pt has urinated about 36mL 8 different times now for an approximate output of 400 mL.

## 2016-03-13 NOTE — ED Notes (Signed)
Pt transported to CT ?

## 2016-03-13 NOTE — ED Notes (Signed)
Per [facility], pt lives @ [facility], has bowel obstruction to the colong.  Abd is distended, tenderness throughout, BM today, normally on 3 L/[place], is a full code.

## 2016-03-15 LAB — URINE CULTURE: Special Requests: NORMAL

## 2016-03-16 DIAGNOSIS — S81811D Laceration without foreign body, right lower leg, subsequent encounter: Secondary | ICD-10-CM

## 2016-03-16 DIAGNOSIS — S81812D Laceration without foreign body, left lower leg, subsequent encounter: Secondary | ICD-10-CM

## 2016-03-16 DIAGNOSIS — I10 Essential (primary) hypertension: Secondary | ICD-10-CM | POA: Diagnosis not present

## 2016-03-16 DIAGNOSIS — J449 Chronic obstructive pulmonary disease, unspecified: Secondary | ICD-10-CM | POA: Diagnosis not present

## 2016-03-16 DIAGNOSIS — Z8701 Personal history of pneumonia (recurrent): Secondary | ICD-10-CM | POA: Diagnosis not present

## 2016-03-16 DIAGNOSIS — F418 Other specified anxiety disorders: Secondary | ICD-10-CM | POA: Diagnosis not present

## 2016-05-03 ENCOUNTER — Other Ambulatory Visit: Payer: Self-pay | Admitting: Internal Medicine

## 2016-05-03 DIAGNOSIS — R0600 Dyspnea, unspecified: Secondary | ICD-10-CM

## 2016-05-06 ENCOUNTER — Ambulatory Visit: Payer: Medicare Other | Admitting: Internal Medicine

## 2016-05-21 ENCOUNTER — Other Ambulatory Visit: Payer: Self-pay | Admitting: Adult Health

## 2016-06-24 ENCOUNTER — Encounter (HOSPITAL_COMMUNITY): Payer: Self-pay

## 2016-06-24 ENCOUNTER — Emergency Department (HOSPITAL_COMMUNITY)
Admission: EM | Admit: 2016-06-24 | Discharge: 2016-06-24 | Disposition: A | Discharge status: Home / Self Care | Payer: Medicare Other | Attending: Emergency Medicine | Admitting: Emergency Medicine

## 2016-06-24 ENCOUNTER — Emergency Department (HOSPITAL_COMMUNITY): Payer: Medicare Other

## 2016-06-24 DIAGNOSIS — R1013 Epigastric pain: Secondary | ICD-10-CM | POA: Diagnosis not present

## 2016-06-24 DIAGNOSIS — E039 Hypothyroidism, unspecified: Secondary | ICD-10-CM | POA: Diagnosis not present

## 2016-06-24 DIAGNOSIS — I2510 Atherosclerotic heart disease of native coronary artery without angina pectoris: Secondary | ICD-10-CM | POA: Insufficient documentation

## 2016-06-24 DIAGNOSIS — I252 Old myocardial infarction: Secondary | ICD-10-CM | POA: Diagnosis not present

## 2016-06-24 DIAGNOSIS — I5032 Chronic diastolic (congestive) heart failure: Secondary | ICD-10-CM | POA: Insufficient documentation

## 2016-06-24 DIAGNOSIS — R1011 Right upper quadrant pain: Secondary | ICD-10-CM | POA: Diagnosis not present

## 2016-06-24 DIAGNOSIS — Z85528 Personal history of other malignant neoplasm of kidney: Secondary | ICD-10-CM | POA: Diagnosis not present

## 2016-06-24 DIAGNOSIS — G308 Other Alzheimer's disease: Secondary | ICD-10-CM | POA: Insufficient documentation

## 2016-06-24 DIAGNOSIS — Z96653 Presence of artificial knee joint, bilateral: Secondary | ICD-10-CM | POA: Diagnosis not present

## 2016-06-24 DIAGNOSIS — N189 Chronic kidney disease, unspecified: Secondary | ICD-10-CM | POA: Insufficient documentation

## 2016-06-24 DIAGNOSIS — I130 Hypertensive heart and chronic kidney disease with heart failure and stage 1 through stage 4 chronic kidney disease, or unspecified chronic kidney disease: Secondary | ICD-10-CM | POA: Diagnosis not present

## 2016-06-24 DIAGNOSIS — F0280 Dementia in other diseases classified elsewhere without behavioral disturbance: Secondary | ICD-10-CM | POA: Diagnosis not present

## 2016-06-24 DIAGNOSIS — J449 Chronic obstructive pulmonary disease, unspecified: Secondary | ICD-10-CM | POA: Diagnosis not present

## 2016-06-24 DIAGNOSIS — R109 Unspecified abdominal pain: Secondary | ICD-10-CM

## 2016-06-24 LAB — COMPREHENSIVE METABOLIC PANEL
ALK PHOS: 51 U/L (ref 38–126)
ALT: 9 U/L — ABNORMAL LOW (ref 17–63)
ANION GAP: 8 (ref 5–15)
AST: 18 U/L (ref 15–41)
Albumin: 3.2 g/dL — ABNORMAL LOW (ref 3.5–5.0)
BUN: 19 mg/dL (ref 6–20)
CALCIUM: 7.8 mg/dL — AB (ref 8.9–10.3)
CHLORIDE: 106 mmol/L (ref 101–111)
CO2: 27 mmol/L (ref 22–32)
Creatinine, Ser: 1.21 mg/dL (ref 0.61–1.24)
GFR, EST NON AFRICAN AMERICAN: 56 mL/min — AB (ref >60)
Glucose, Bld: 89 mg/dL (ref 65–99)
Potassium: 3.8 mmol/L (ref 3.5–5.1)
SODIUM: 141 mmol/L (ref 135–145)
Total Bilirubin: 0.7 mg/dL (ref 0.3–1.2)
Total Protein: 5.5 g/dL — ABNORMAL LOW (ref 6.5–8.1)

## 2016-06-24 LAB — CBC WITH DIFFERENTIAL/PLATELET
Basophils Absolute: 0 10*3/uL (ref 0.0–0.1)
Basophils Relative: 0 %
EOS ABS: 0.1 10*3/uL (ref 0.0–0.7)
EOS PCT: 1 %
HCT: 37 % — ABNORMAL LOW (ref 39.0–52.0)
Hemoglobin: 11.8 g/dL — ABNORMAL LOW (ref 13.0–17.0)
LYMPHS ABS: 1 10*3/uL (ref 0.7–4.0)
Lymphocytes Relative: 14 %
MCH: 32.2 pg (ref 26.0–34.0)
MCHC: 31.9 g/dL (ref 30.0–36.0)
MCV: 100.8 fL — ABNORMAL HIGH (ref 78.0–100.0)
MONOS PCT: 8 %
Monocytes Absolute: 0.5 10*3/uL (ref 0.1–1.0)
Neutro Abs: 5.4 10*3/uL (ref 1.7–7.7)
Neutrophils Relative %: 77 %
PLATELETS: 151 10*3/uL (ref 150–400)
RBC: 3.67 MIL/uL — ABNORMAL LOW (ref 4.22–5.81)
RDW: 13.2 % (ref 11.5–15.5)
WBC: 7.1 10*3/uL (ref 4.0–10.5)

## 2016-06-24 LAB — URINALYSIS, ROUTINE W REFLEX MICROSCOPIC
Bilirubin Urine: NEGATIVE
Glucose, UA: NEGATIVE mg/dL
KETONES UR: NEGATIVE mg/dL
Nitrite: NEGATIVE
PROTEIN: NEGATIVE mg/dL
Specific Gravity, Urine: 1.023 (ref 1.005–1.030)
pH: 8.5 — ABNORMAL HIGH (ref 5.0–8.0)

## 2016-06-24 LAB — URINE MICROSCOPIC-ADD ON

## 2016-06-24 LAB — LIPASE, BLOOD: LIPASE: 25 U/L (ref 11–51)

## 2016-06-24 LAB — TROPONIN I

## 2016-06-24 MED ORDER — FAMOTIDINE 20 MG PO TABS
40.0000 mg | ORAL_TABLET | Freq: Once | ORAL | Status: AC
  Administered 2016-06-24: 40 mg via ORAL
  Filled 2016-06-24: qty 2

## 2016-06-24 MED ORDER — GI COCKTAIL ~~LOC~~
30.0000 mL | Freq: Once | ORAL | Status: AC
  Administered 2016-06-24: 30 mL via ORAL
  Filled 2016-06-24: qty 30

## 2016-06-24 MED ORDER — HYDROCODONE-ACETAMINOPHEN 5-325 MG PO TABS: 1.0000 | ORAL_TABLET | ORAL | 0 refills | Status: DC | PRN

## 2016-06-24 MED ORDER — PANTOPRAZOLE SODIUM 20 MG PO TBEC: 20.0000 mg | DELAYED_RELEASE_TABLET | Freq: Every day | ORAL | 0 refills | Status: DC

## 2016-06-24 MED ORDER — SUCRALFATE 1 G PO TABS: 1.0000 g | ORAL_TABLET | Freq: Four times a day (QID) | ORAL | 0 refills | Status: DC

## 2016-06-24 MED ORDER — SODIUM CHLORIDE 0.9 % IV SOLN
INTRAVENOUS | Status: DC
  Administered 2016-06-24: 13:00:00 via INTRAVENOUS

## 2016-06-24 NOTE — ED Notes (Signed)
Wheeled pt to restroom from room. Tolerated well. Pt placed back on monitor.

## 2016-06-24 NOTE — ED Notes (Signed)
Pt. Transported to CT at this time.  

## 2016-06-24 NOTE — ED Notes (Signed)
EDP at bedside  

## 2016-06-24 NOTE — ED Notes (Signed)
Pt. Given pain score in order to d/c

## 2016-06-24 NOTE — ED Provider Notes (Signed)
[facility] DEPT Provider Note   CSN: [id number] Arrival date & time: 06/24/16  1218     History   Chief Complaint Chief Complaint  Patient presents with  . Abdominal Pain    HPI [name] [name] is a 75 y.o. male.  75 year old male presents with epigastric pain that has been recurrent for the past month. States his symptoms come and go and are located in his right upper quadrant and left upper quadrant. Denies any associated fever, vomiting, diarrhea. No associated dyspnea or diaphoresis. Pain is worse when he palpates his upper abdomen. Denies any constipation. No increased flatus. Denies any increased burping. No exertional component to this. Has not used any medications for this prior to arrival. Symptoms wax and wane      Past Medical History:  Diagnosis Date  . Allergic rhinitis, cause unspecified   . Arthritis    osteoarthritis  . Cancer ([place])    renal carcinoma  . COPD (chronic obstructive pulmonary disease) (HCC)    [name] FOLLOWS PT--PT USING OXYGEN 24 HRS A DAY  . COPD (chronic obstructive pulmonary disease) (HCC)    PULMONARY CLEARANCE FOR LEFT RENAL CRYOABLATION ON CHART FROM DR. CLANCE -07/26/11  . Coronary artery disease    CARDIAC CLEARANCE  OFFICE NOTE FOR LEFT CRYO ABLATION ON CHART FROM [name] -"RECENT STRESS TEST DID NOT SHOW ISCHEMIA" -"PRESUMED CAD DUE TO CORONARY CALCIFICATIONS"   . DEMENTIA    Alzheimer's, senile dementia  . Family history of ischemic heart disease   . Family history of malignant neoplasm of gastrointestinal tract   . GERD (gastroesophageal reflux disease)   . Hypothyroidism   . Myocardial infarction ([place])   . Obesity hypoventilation syndrome ([place])    failed cpap/bipap trial  . Other and unspecified hyperlipidemia   . Other congenital hamartoses, not elsewhere classified   . Pneumonia    HX     . Shortness of breath   . Sleep apnea    OXYGEN 24 HR/DAY-NO CPAP  . Unspecified essential hypertension      Patient Active Problem List   Diagnosis Date Noted  . Chronic diastolic CHF (congestive heart failure) ([place]) 03/03/2016  . Coronary artery disease 03/03/2016  . Thoracic aortic aneurysm ([place]) 03/03/2016  . Dementia 02/23/2016  . CAP (community acquired pneumonia) 02/23/2016  . COPD (chronic obstructive pulmonary disease) ([place]) 02/23/2016  . Cellulitis, leg 02/23/2016  . Oliguria 02/23/2016  . Respiratory alkalosis 02/23/2016  . Chest pain 02/23/2016  . Acute on chronic renal failure ([place]) 02/23/2016  . Chronic respiratory failure with hypoxia and hypercapnia ([place]) 09/27/2015  . Morbid obesity ([place]) 07/13/2015  . Dyspnea 07/12/2015  . Healthcare-associated pneumonia 01/17/2012  . OSA (obstructive sleep apnea) 11/15/2011  . Renal mass, left 09/14/2011  . DISORDERS OF DIAPHRAGM 07/02/2010  . OBESITY HYPOVENTILATION SYNDROME 02/14/2009  . Multiple pulmonary nodules 02/14/2009  . CHOLELITHIASIS 05/04/2008  . Nonspecific (abnormal) findings on radiological and other examination of body structure 04/28/2008  . ABNORMAL CHEST XRAY 04/28/2008  . 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 HYPOPARATHYROIDISM 03/31/2008  . HYPOCALCEMIA 03/31/2008  . 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 DYSLIPIDEMIA 09/21/2007  . Hypertensive heart disease with CHF (congestive heart failure) ([place]) 09/21/2007  . Allergic rhinitis 09/21/2007    Past Surgical History:  Procedure Laterality Date  . CARPAL TUNNEL RELEASE     right  . CHOLECYSTECTOMY  2009  . EYE SURGERY     bilateral cataracts  . JOINT REPLACEMENT     bilateral  knee replacements  . KNEE ARTHROPLASTY     2005 LEFT   AND 2007 RIGHT--[name]  . THYROIDECTOMY    . TUMOR REMOVAL  09/14/2011       Home Medications    Prior to Admission medications   Medication Sig Start Date End Date Taking? Authorizing Provider  acetaminophen (TYLENOL) 500 MG tablet Take 1,000 mg by mouth 2 (two) times daily.    Historical Provider,  MD  albuterol (PROVENTIL) (2.5 MG/3ML) 0.083% nebulizer solution Take 2.5 mg by nebulization every 6 (six) hours as needed for wheezing or shortness of breath. Reported on 03/13/2016    Historical Provider, MD  ALPRAZolam Duanne Moron) 0.25 MG tablet Take 1 tablet (0.25 mg total) by mouth 2 (two) times daily as needed for anxiety. 02/27/16   Domenic Polite, MD  Bismuth Tribromoph-Petrolatum (XEROFORM PETROLAT GAUZE 1"X8" EX) Apply topically. Apply to left and right lower leg every day    Historical Provider, MD  calcitRIOL (ROCALTROL) 0.5 MCG capsule Take 0.5 mcg by mouth daily.    Historical Provider, MD  calcium carbonate (TUMS - DOSED IN MG ELEMENTAL CALCIUM) 500 MG chewable tablet Chew 2 tablets by mouth daily as needed for indigestion or heartburn.    Historical Provider, MD  carboxymethylcellulose 1 % ophthalmic solution Place 1 drop into both eyes 2 (two) times daily.    Historical Provider, MD  clopidogrel (PLAVIX) 75 MG tablet Take 75 mg by mouth every morning.     Historical Provider, MD  cyanocobalamin 500 MCG tablet Take 500 mcg by mouth daily.    Historical Provider, MD  Dextromethorphan-Guaifenesin (DIABETIC TUSSIN DM MAX ST PO) Take 5 mLs by mouth 2 (two) times daily as needed (for cough).    Historical Provider, MD  fluticasone (FLONASE) 50 MCG/ACT nasal spray Place 1 spray into the nose daily as needed for allergies.     Historical Provider, MD  HYDROcodone-acetaminophen (NORCO/VICODIN) 5-325 MG tablet Take 1 tablet by mouth every 6 (six) hours as needed for moderate pain. 02/27/16   Domenic Polite, MD  ipratropium (ATROVENT) 0.02 % nebulizer solution Take 0.5 mg by nebulization every 6 (six) hours as needed for wheezing or shortness of breath.    Historical Provider, MD  levothyroxine (SYNTHROID, LEVOTHROID) 25 MCG tablet Take 25 mcg by mouth daily before breakfast.    Historical Provider, MD  metoprolol tartrate (LOPRESSOR) 25 MG tablet Take 12.5 mg by mouth 2 (two) times daily. Hold for  systolic blood pressure [id number]    Historical Provider, MD  mometasone-formoterol (DULERA) 100-5 MCG/ACT AERO Inhale 2 puffs into the lungs 2 (two) times daily.    Historical Provider, MD  OXYGEN Inhale into the lungs at bedtime.    Historical Provider, MD  polyethylene glycol powder (GLYCOLAX/MIRALAX) powder Take 1 Container by mouth 2 (two) times daily. Mix 1 capful (17g) in 8 ounces of fluid and drink    Historical Provider, MD  senna-docusate (SENOKOT-S) 8.6-50 MG tablet Take 1 tablet by mouth 2 (two) times daily. 02/27/16   Domenic Polite, MD  Tamsulosin HCl (FLOMAX) 0.4 MG CAPS Take 0.4 mg by mouth daily. Hold if SBP < 100    Historical Provider, MD  traMADol (ULTRAM) 50 MG tablet Take 100 mg by mouth 2 (two) times daily.    Historical Provider, MD  traZODone (DESYREL) 150 MG tablet Take 300 mg by mouth at bedtime.    Historical Provider, MD    Family History Family History  Problem Relation Age of Onset  . Cancer  Other     colon  . Heart disease Other     CAD, MI and Heart Attack    Social History Social History  Substance Use Topics  . Smoking status: Never Smoker  . Smokeless tobacco: Never Used  . Alcohol use No     Allergies   Review of patient's allergies indicates no known allergies.   Review of Systems Review of Systems  All other systems reviewed and are negative.    Physical Exam Updated Vital Signs BP 112/77 (BP Location: Right Arm)   Pulse (!) 59   Temp 98.9 F (37.2 C) (Oral)   Resp 18   Ht 6\' 3"  (1.905 m)   Wt 127 kg   SpO2 100%   BMI 35.00 kg/m   Physical Exam  Constitutional: He is oriented to person, place, and time. He appears well-developed and well-nourished.  Non-toxic appearance. No distress.  HENT:  Head: Normocephalic and atraumatic.  Eyes: Conjunctivae, EOM and lids are normal. Pupils are equal, round, and reactive to light.  Neck: Normal range of motion. Neck supple. No tracheal deviation present. No thyroid mass present.   Cardiovascular: Normal rate, regular rhythm and normal heart sounds.  Exam reveals no gallop.   No murmur heard. Pulmonary/Chest: Effort normal and breath sounds normal. No stridor. No respiratory distress. He has no decreased breath sounds. He has no wheezes. He has no rhonchi. He has no rales.  Abdominal: Soft. Normal appearance and bowel sounds are normal. He exhibits no distension. There is tenderness in the right upper quadrant and epigastric area. There is no rebound and no CVA tenderness.    Musculoskeletal: Normal range of motion. He exhibits no edema or tenderness.  Neurological: He is alert and oriented to person, place, and time. He has normal strength. No cranial nerve deficit or sensory deficit. GCS eye subscore is 4. GCS verbal subscore is 5. GCS motor subscore is 6.  Skin: Skin is warm and dry. No abrasion and no rash noted.  Psychiatric: He has a normal mood and affect. His speech is normal and behavior is normal.  Nursing note and vitals reviewed.    ED Treatments / Results  Labs (all labs ordered are listed, but only abnormal results are displayed) Labs Reviewed  CBC WITH DIFFERENTIAL/PLATELET  COMPREHENSIVE METABOLIC PANEL  LIPASE, BLOOD  TROPONIN I    EKG  EKG Interpretation None       Radiology No results found.  Procedures Procedures (including critical care time)  Medications Ordered in ED Medications  0.9 %  sodium chloride infusion (not administered)  gi cocktail (Maalox,Lidocaine,Donnatal) (not administered)  famotidine (PEPCID) tablet 40 mg (not administered)     Initial Impression / Assessment and Plan / ED Course  I have reviewed the triage vital signs and the nursing notes.  Pertinent labs & imaging results that were available during my care of the patient were reviewed by me and considered in my medical decision making (see chart for details).  Clinical Course    Patient without acute findings on this abdominal CT. Did have some  blood in his urine. No evidence of infection. Given GI cocktail and Pepcid). Suspect GERD. Troponin negative. Do not think this is ACS. Stable for discharge  Final Clinical Impressions(s) / ED Diagnoses   Final diagnoses:  None    New Prescriptions New Prescriptions   No medications on file     Lacretia Leigh, MD 06/24/16 1530

## 2016-06-24 NOTE — ED Triage Notes (Signed)
Pt. Coming from [facility] via [facility] for upper abd. Pain starting last night. Pt. Denies N/V/D. Pt. Grasping his epigastric area. EMS sts patient is tender to palpation RUQ and LUQ. Pt. On 2-3 L of oxygen all the time. Pt. Noted to have redness/edema to bilateral lower legs.

## 2016-06-25 LAB — URINE CULTURE

## 2016-10-07 ENCOUNTER — Encounter: Payer: Self-pay | Admitting: Internal Medicine

## 2016-10-07 ENCOUNTER — Ambulatory Visit (INDEPENDENT_AMBULATORY_CARE_PROVIDER_SITE_OTHER): Payer: Medicare Other | Admitting: Internal Medicine

## 2016-10-07 ENCOUNTER — Encounter: Payer: Self-pay | Admitting: *Deleted

## 2016-10-07 ENCOUNTER — Ambulatory Visit (INDEPENDENT_AMBULATORY_CARE_PROVIDER_SITE_OTHER)
Admission: RE | Admit: 2016-10-07 | Discharge: 2016-10-07 | Disposition: A | Discharge status: Home / Self Care | Payer: Medicare Other | Source: Ambulatory Visit | Attending: Internal Medicine | Admitting: Internal Medicine

## 2016-10-07 ENCOUNTER — Other Ambulatory Visit (INDEPENDENT_AMBULATORY_CARE_PROVIDER_SITE_OTHER): Payer: Medicare Other

## 2016-10-07 VITALS — BP 116/74 | HR 68 | Ht 75.0 in | Wt 262.0 lb

## 2016-10-07 DIAGNOSIS — J9611 Chronic respiratory failure with hypoxia: Secondary | ICD-10-CM | POA: Diagnosis not present

## 2016-10-07 DIAGNOSIS — R0609 Other forms of dyspnea: Secondary | ICD-10-CM

## 2016-10-07 DIAGNOSIS — I2510 Atherosclerotic heart disease of native coronary artery without angina pectoris: Secondary | ICD-10-CM

## 2016-10-07 DIAGNOSIS — R918 Other nonspecific abnormal finding of lung field: Secondary | ICD-10-CM | POA: Diagnosis not present

## 2016-10-07 DIAGNOSIS — J9612 Chronic respiratory failure with hypercapnia: Secondary | ICD-10-CM | POA: Diagnosis not present

## 2016-10-07 LAB — BASIC METABOLIC PANEL
BUN: 22 mg/dL (ref 6–23)
CO2: 32 mEq/L (ref 19–32)
Calcium: 8.6 mg/dL (ref 8.4–10.5)
Chloride: 102 mEq/L (ref 96–112)
Creatinine, Ser: 1.24 mg/dL (ref 0.40–1.50)
GFR: 60.06 mL/min (ref >60.00)
Glucose, Bld: 71 mg/dL (ref 70–99)
POTASSIUM: 4.5 meq/L (ref 3.5–5.1)
Sodium: 142 mEq/L (ref 135–145)

## 2016-10-07 LAB — CBC WITH DIFFERENTIAL/PLATELET
Basophils Absolute: 0 10*3/uL (ref 0.0–0.1)
Basophils Relative: 0.3 % (ref 0.0–3.0)
Eosinophils Absolute: 0.2 10*3/uL (ref 0.0–0.7)
Eosinophils Relative: 1.9 % (ref 0.0–5.0)
HEMATOCRIT: 37 % — AB (ref 39.0–52.0)
HEMOGLOBIN: 12.6 g/dL — AB (ref 13.0–17.0)
Lymphocytes Relative: 13.3 % (ref 12.0–46.0)
Lymphs Abs: 1 10*3/uL (ref 0.7–4.0)
MCHC: 34.2 g/dL (ref 30.0–36.0)
MCV: 97 fl (ref 78.0–100.0)
MONOS PCT: 6.9 % (ref 3.0–12.0)
Monocytes Absolute: 0.5 10*3/uL (ref 0.1–1.0)
Neutro Abs: 6.1 10*3/uL (ref 1.4–7.7)
Neutrophils Relative %: 77.6 % — ABNORMAL HIGH (ref 43.0–77.0)
Platelets: 188 10*3/uL (ref 150.0–400.0)
RBC: 3.81 Mil/uL — AB (ref 4.22–5.81)
RDW: 13 % (ref 11.5–15.5)
WBC: 7.8 10*3/uL (ref 4.0–10.5)

## 2016-10-07 LAB — BRAIN NATRIURETIC PEPTIDE: PRO B NATRI PEPTIDE: 72 pg/mL (ref 0.0–100.0)

## 2016-10-07 LAB — TSH: TSH: 0.45 u[IU]/mL (ref 0.35–4.50)

## 2016-10-07 LAB — SEDIMENTATION RATE: Sed Rate: 39 mm/hr — ABNORMAL HIGH (ref 0–20)

## 2016-10-07 NOTE — Assessment & Plan Note (Addendum)
-   07/12/2015  Walked RA x 2 laps @ 185 ft each stopped due to  Ankle pain, no sob or sat < 90% at nl pace Spirometry restrictive 09/26/2015 with HC03  07/10/15 31 c/w mild hypercarbia = OHS  - HCO3 10/07/2016 =  32  - 10/07/2016   Walked RA x one lap @ 185 stopped due to  Sob/ slow to moderate pace, no desats   Does not qualify for amb 02 > continue 3lpm hs only   I had an extended final summary discussion with the patient reviewing all relevant studies completed to date and  lasting 15 to 20 minutes of a 25 minute visit on the following issues:   Each maintenance medication was reviewed in detail including most importantly the difference between maintenance and as needed and under what circumstances the prns are to be used.  Please see AVS for  customized  Instructions which were reviewed in detail in writing with the patient and a copy provided.     Pulmonary f/u is on a as needed basis only, with refills on all pulmonary meds deferred to PCP as appropriate  unless otherwise requested to be seen here for that purpose or any new pulmonary issues should they arise

## 2016-10-07 NOTE — Assessment & Plan Note (Signed)
Complicated by ohs / restrictive physiology on pfts 09/26/2015 and hyperlipidemia  Body mass index is 32.75 trending down  Lab Results  Component Value Date   TSH 0.45 10/07/2016     Contributing to gerd tendency/ doe/reviewed the need and the process to achieve and maintain neg calorie balance > defer f/u primary care including intermittently monitoring thyroid status

## 2016-10-07 NOTE — Progress Notes (Signed)
ATC and msg states the subscriber does not receive incoming calls

## 2016-10-07 NOTE — Progress Notes (Signed)
Subjective:    Patient ID: [name] [name], male    DOB: 10-25-1940,   MRN: [id number]    Brief patient profile:  1 yowm never smoker with prev dx of OHS/ MPNS since at least 2012  last seen by Dr [name] [name] 2013 referred back to pulmonary clinic 07/12/2015 by [name] Sours NP for sob with new CT abd 07/10/15 suggesting slt growth RLL process since 2013 with dx of renal cell ca/ dementia per [facility] records    History of Present Illness  07/12/2015 1 office visit/ [name] [name]  Pulm consultation  Chief Complaint  Patient presents with  . Pulmonary Consult    Referred by [name] Sours, NP. Pt c/o chronic SOB x 1 year, non-prod cough, right sided rib pain. Pt states recent PNA   extremely difficult hx/ Patient failed to answer a single question asked in a straightforward manner, tending to go off on tangents or answer questions with ambiguous medical terms or diagnoses and seemed aggravated  when asked the same question more than once for clarification.  Says sob x 50 ft (see walking study) gradually worse x one year despite symbicort 160 2bid/ no better with saba Cough is dry, cp is not pleuritic  - no bloody or purulent mucus.  rec Please remember to go to the  x-ray department downstairs for your tests - we will call you with the results when they are available. Please schedule a follow up visit in 3 months but call sooner if needed       09/26/2015  f/u ov/[name] [name] re: pulm infiltrates/ chronic resp failure with purely restrictive spirometry   Chief Complaint  Patient presents with  . Follow-up    pt following for pulmonary nodules: pt states hes having some problems breathing and chest tightness for quit a while. pt c/o dry cough and slight wheezing. pt using in hailers with little relief.  pt useing O2 at night at 3LPM. DME: [employer]   Slowed by L hip / L foot not sob, has 02 3lpm and only has concentrator so doesn't use any portable 02  rec Please remember to go to the x-ray department downstairs for  your tests - we will call you with the results when they are available.  Please schedule a follow up office visit in 6 weeks, call sooner if needed  Late add : change to 3 m with cxr and full pfts > did not return     10/07/2016  f/u ov/[name] [name] re:  Obesity/ resp failure/  Using 02 hs 3lpm and when wears 02 walking does better  Chief Complaint  Patient presents with  . Follow-up    Pt is unsure of why he is here today.   doe walking from new room closer to cafeteria and better if on 02 but most times doesn't have to stop due to sob    No obvious day to day or daytime variability or assoc excess or purulent sputum  or   subjective wheeze or overt sinus or hb symptoms. No unusual exp hx or h/o childhood pna/ asthma or knowledge of premature birth.  Sleeping ok 0n 3lpm without nocturnal  or early am exacerbation  of respiratory  c/o's or need for noct saba. Also denies any obvious fluctuation of symptoms with weather or environmental changes or other aggravating or alleviating factors except as outlined above   Current Medications, Allergies, Complete Past Medical History, Past Surgical History, Family History, and Social History were reviewed in [employer]  record.  ROS  The following are not active complaints unless bolded sore throat, dysphagia, dental problems, itching, sneezing,  nasal congestion or excess/ purulent secretions, ear ache,   fever, chills, sweats, unintended wt loss, classically pleuritic or exertional cp, hemoptysis,  orthopnea pnd or leg swelling, presyncope, palpitations, abdominal pain only when bends over at the waist, anorexia, nausea, vomiting, diarrhea  or change in bowel or bladder habits, change in stools or urine, dysuria,hematuria,  rash, arthralgias, visual complaints, headache, numbness, weakness or ataxia or problems with walking or coordination,  change in mood/affect or memory.                 Objective:   Physical Exam  Chronically  ill elderly wm walking with rollator - no 02 with him   10/07/2016     262  09/26/2015       299  07/12/15 291 lb (131.997 kg)  07/06/15 295 lb (133.811 kg)  02/23/15 300 lb (136.079 kg)    Vital signs reviewed  - Note on arrival 02 sats  97% on RA     HEENT: nl dentition, turbinates, and orophanx. Nl external ear canals without cough reflex   NECK :  without JVD/Nodes/TM/ nl carotid upstrokes bilaterally   LUNGS: no acc muscle use, clear to A and P bilaterally without cough on insp or exp maneuvers   CV:  RRR  no s3 or murmur or increase in P2, no edema   ABD:  soft and nontender with nl excursion in the supine position. No bruits or organomegaly, bowel sounds nl  MS:  warm without deformities, calf tenderness, cyanosis or clubbing  SKIN: warm and dry with venous stasis dermatitis changes both shins   NEURO:  alert, approp, no deficits     CXR PA and Lateral:   10/07/2016 :    I personally reviewed images and agree with radiology impression as follows:    1. No acute cardiopulmonary abnormalities. My impression:  Lots of gas in colon under L > R Hemidiaphragm     Labs ordered/ reviewed:    Chemistry      Component Value Date/Time   NA 142 10/07/2016 1105   K 4.5 10/07/2016 1105   CL 102 10/07/2016 1105   CO2 32 10/07/2016 1105   BUN 22 10/07/2016 1105   CREATININE 1.24 10/07/2016 1105      Component Value Date/Time   CALCIUM 8.6 10/07/2016 1105   CALCIUM 9.5 03/19/2011 1344   ALKPHOS 51 06/24/2016 1245   AST 18 06/24/2016 1245   ALT 9 (L) 06/24/2016 1245   BILITOT 0.7 06/24/2016 1245        Lab Results  Component Value Date   WBC 7.8 10/07/2016   HGB 12.6 (L) 10/07/2016   HCT 37.0 (L) 10/07/2016   MCV 97.0 10/07/2016   PLT 188.0 10/07/2016         Lab Results  Component Value Date   TSH 0.45 10/07/2016     Lab Results  Component Value Date   PROBNP 72.0 10/07/2016       Lab Results  Component Value Date   ESRSEDRATE 39 (H) 10/07/2016    ESRSEDRATE 37 (H) 05/13/2011           Assessment & Plan:

## 2016-10-07 NOTE — Assessment & Plan Note (Signed)
Most likely related to obesity and limited diaphragm excursion made worse by aerophagia/ retained air in abd but no pulmonary intervention needed here > f/u can be prn

## 2016-10-07 NOTE — Assessment & Plan Note (Signed)
-   CT 06/2011 stable.  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

## 2016-10-07 NOTE — Patient Instructions (Addendum)
Please remember to go to the lab and x-ray department downstairs for your tests - we will call you with the results when they are available.    Pulmonary follow up will be as needed

## 2016-10-10 ENCOUNTER — Other Ambulatory Visit: Payer: Self-pay

## 2016-10-10 ENCOUNTER — Encounter (HOSPITAL_COMMUNITY): Payer: Self-pay

## 2016-10-10 ENCOUNTER — Emergency Department (HOSPITAL_COMMUNITY): Payer: Medicare Other

## 2016-10-10 ENCOUNTER — Observation Stay (HOSPITAL_COMMUNITY)
Admission: EM | Admit: 2016-10-10 | Discharge: 2016-10-11 | Disposition: A | Discharge status: Skilled Nursing Facility | Payer: Medicare Other | Attending: Internal Medicine | Admitting: Internal Medicine

## 2016-10-10 DIAGNOSIS — I700 Atherosclerosis of aorta: Secondary | ICD-10-CM | POA: Diagnosis not present

## 2016-10-10 DIAGNOSIS — I2510 Atherosclerotic heart disease of native coronary artery without angina pectoris: Secondary | ICD-10-CM | POA: Diagnosis not present

## 2016-10-10 DIAGNOSIS — Z6832 Body mass index (BMI) 32.0-32.9, adult: Secondary | ICD-10-CM | POA: Insufficient documentation

## 2016-10-10 DIAGNOSIS — I252 Old myocardial infarction: Secondary | ICD-10-CM | POA: Insufficient documentation

## 2016-10-10 DIAGNOSIS — Z7902 Long term (current) use of antithrombotics/antiplatelets: Secondary | ICD-10-CM | POA: Insufficient documentation

## 2016-10-10 DIAGNOSIS — G4733 Obstructive sleep apnea (adult) (pediatric): Secondary | ICD-10-CM | POA: Insufficient documentation

## 2016-10-10 DIAGNOSIS — M1610 Unilateral primary osteoarthritis, unspecified hip: Secondary | ICD-10-CM | POA: Insufficient documentation

## 2016-10-10 DIAGNOSIS — E785 Hyperlipidemia, unspecified: Secondary | ICD-10-CM | POA: Insufficient documentation

## 2016-10-10 DIAGNOSIS — E039 Hypothyroidism, unspecified: Secondary | ICD-10-CM | POA: Diagnosis present

## 2016-10-10 DIAGNOSIS — G4700 Insomnia, unspecified: Secondary | ICD-10-CM | POA: Diagnosis not present

## 2016-10-10 DIAGNOSIS — N183 Chronic kidney disease, stage 3 unspecified: Secondary | ICD-10-CM | POA: Diagnosis present

## 2016-10-10 DIAGNOSIS — K219 Gastro-esophageal reflux disease without esophagitis: Secondary | ICD-10-CM | POA: Insufficient documentation

## 2016-10-10 DIAGNOSIS — F0280 Dementia in other diseases classified elsewhere without behavioral disturbance: Secondary | ICD-10-CM | POA: Insufficient documentation

## 2016-10-10 DIAGNOSIS — J440 Chronic obstructive pulmonary disease with acute lower respiratory infection: Secondary | ICD-10-CM | POA: Insufficient documentation

## 2016-10-10 DIAGNOSIS — Z800 Family history of malignant neoplasm of digestive organs: Secondary | ICD-10-CM | POA: Insufficient documentation

## 2016-10-10 DIAGNOSIS — G309 Alzheimer's disease, unspecified: Secondary | ICD-10-CM | POA: Diagnosis not present

## 2016-10-10 DIAGNOSIS — N400 Enlarged prostate without lower urinary tract symptoms: Secondary | ICD-10-CM | POA: Diagnosis not present

## 2016-10-10 DIAGNOSIS — J209 Acute bronchitis, unspecified: Secondary | ICD-10-CM | POA: Diagnosis not present

## 2016-10-10 DIAGNOSIS — F419 Anxiety disorder, unspecified: Secondary | ICD-10-CM | POA: Insufficient documentation

## 2016-10-10 DIAGNOSIS — Z7951 Long term (current) use of inhaled steroids: Secondary | ICD-10-CM | POA: Insufficient documentation

## 2016-10-10 DIAGNOSIS — G8929 Other chronic pain: Secondary | ICD-10-CM | POA: Insufficient documentation

## 2016-10-10 DIAGNOSIS — E890 Postprocedural hypothyroidism: Secondary | ICD-10-CM | POA: Diagnosis not present

## 2016-10-10 DIAGNOSIS — D631 Anemia in chronic kidney disease: Secondary | ICD-10-CM | POA: Insufficient documentation

## 2016-10-10 DIAGNOSIS — Z96653 Presence of artificial knee joint, bilateral: Secondary | ICD-10-CM | POA: Insufficient documentation

## 2016-10-10 DIAGNOSIS — J9611 Chronic respiratory failure with hypoxia: Secondary | ICD-10-CM | POA: Diagnosis not present

## 2016-10-10 DIAGNOSIS — I5032 Chronic diastolic (congestive) heart failure: Secondary | ICD-10-CM | POA: Diagnosis not present

## 2016-10-10 DIAGNOSIS — Z8249 Family history of ischemic heart disease and other diseases of the circulatory system: Secondary | ICD-10-CM | POA: Insufficient documentation

## 2016-10-10 DIAGNOSIS — Z85528 Personal history of other malignant neoplasm of kidney: Secondary | ICD-10-CM | POA: Insufficient documentation

## 2016-10-10 DIAGNOSIS — R0789 Other chest pain: Principal | ICD-10-CM | POA: Insufficient documentation

## 2016-10-10 DIAGNOSIS — J9612 Chronic respiratory failure with hypercapnia: Secondary | ICD-10-CM

## 2016-10-10 DIAGNOSIS — I130 Hypertensive heart and chronic kidney disease with heart failure and stage 1 through stage 4 chronic kidney disease, or unspecified chronic kidney disease: Secondary | ICD-10-CM | POA: Diagnosis not present

## 2016-10-10 DIAGNOSIS — J449 Chronic obstructive pulmonary disease, unspecified: Secondary | ICD-10-CM | POA: Diagnosis present

## 2016-10-10 DIAGNOSIS — I10 Essential (primary) hypertension: Secondary | ICD-10-CM | POA: Diagnosis present

## 2016-10-10 DIAGNOSIS — Z9981 Dependence on supplemental oxygen: Secondary | ICD-10-CM | POA: Insufficient documentation

## 2016-10-10 DIAGNOSIS — J40 Bronchitis, not specified as acute or chronic: Secondary | ICD-10-CM | POA: Diagnosis present

## 2016-10-10 LAB — CBC WITH DIFFERENTIAL/PLATELET
BASOS ABS: 0 10*3/uL (ref 0.0–0.1)
BASOS PCT: 0 %
Eosinophils Absolute: 0.1 10*3/uL (ref 0.0–0.7)
Eosinophils Relative: 2 %
HEMATOCRIT: 36 % — AB (ref 39.0–52.0)
HEMOGLOBIN: 11.9 g/dL — AB (ref 13.0–17.0)
LYMPHS PCT: 18 %
Lymphs Abs: 1.3 10*3/uL (ref 0.7–4.0)
MCH: 32.8 pg (ref 26.0–34.0)
MCHC: 33.1 g/dL (ref 30.0–36.0)
MCV: 99.2 fL (ref 78.0–100.0)
MONO ABS: 0.5 10*3/uL (ref 0.1–1.0)
Monocytes Relative: 7 %
NEUTROS ABS: 5.2 10*3/uL (ref 1.7–7.7)
NEUTROS PCT: 73 %
Platelets: 173 10*3/uL (ref 150–400)
RBC: 3.63 MIL/uL — AB (ref 4.22–5.81)
RDW: 12.6 % (ref 11.5–15.5)
WBC: 7.2 10*3/uL (ref 4.0–10.5)

## 2016-10-10 LAB — COMPREHENSIVE METABOLIC PANEL
ALBUMIN: 3.2 g/dL — AB (ref 3.5–5.0)
ALT: 9 U/L — ABNORMAL LOW (ref 17–63)
AST: 23 U/L (ref 15–41)
Alkaline Phosphatase: 57 U/L (ref 38–126)
Anion gap: 10 (ref 5–15)
BILIRUBIN TOTAL: 0.5 mg/dL (ref 0.3–1.2)
BUN: 14 mg/dL (ref 6–20)
CO2: 26 mmol/L (ref 22–32)
CREATININE: 1.29 mg/dL — AB (ref 0.61–1.24)
Calcium: 8.4 mg/dL — ABNORMAL LOW (ref 8.9–10.3)
Chloride: 106 mmol/L (ref 101–111)
GFR calc Af Amer: >60 mL/min (ref >60)
GFR, EST NON AFRICAN AMERICAN: 52 mL/min — AB (ref >60)
GLUCOSE: 89 mg/dL (ref 65–99)
POTASSIUM: 3.9 mmol/L (ref 3.5–5.1)
Sodium: 142 mmol/L (ref 135–145)
TOTAL PROTEIN: 5.9 g/dL — AB (ref 6.5–8.1)

## 2016-10-10 LAB — CBC
HEMATOCRIT: 33.7 % — AB (ref 39.0–52.0)
HEMOGLOBIN: 11 g/dL — AB (ref 13.0–17.0)
MCH: 32.3 pg (ref 26.0–34.0)
MCHC: 32.6 g/dL (ref 30.0–36.0)
MCV: 98.8 fL (ref 78.0–100.0)
PLATELETS: 155 10*3/uL (ref 150–400)
RBC: 3.41 MIL/uL — AB (ref 4.22–5.81)
RDW: 12.7 % (ref 11.5–15.5)
WBC: 7.8 10*3/uL (ref 4.0–10.5)

## 2016-10-10 LAB — I-STAT TROPONIN, ED: Troponin i, poc: 0 ng/mL (ref 0.00–0.08)

## 2016-10-10 LAB — CREATININE, SERUM
Creatinine, Ser: 1.28 mg/dL — ABNORMAL HIGH (ref 0.61–1.24)
GFR calc non Af Amer: 52 mL/min — ABNORMAL LOW (ref >60)

## 2016-10-10 LAB — TROPONIN I
Troponin I: <0.03 ng/mL (ref <0.03)
Troponin I: <0.03 ng/mL (ref <0.03)

## 2016-10-10 LAB — D-DIMER, QUANTITATIVE: D-Dimer, Quant: 0.88 ug/mL-FEU — ABNORMAL HIGH (ref 0.00–0.50)

## 2016-10-10 LAB — BRAIN NATRIURETIC PEPTIDE: B NATRIURETIC PEPTIDE 5: 48 pg/mL (ref 0.0–100.0)

## 2016-10-10 MED ORDER — LEVOTHYROXINE SODIUM 100 MCG PO TABS
200.0000 ug | ORAL_TABLET | Freq: Every day | ORAL | Status: DC
  Administered 2016-10-11: 200 ug via ORAL
  Filled 2016-10-10: qty 2

## 2016-10-10 MED ORDER — IOPAMIDOL (ISOVUE-370) INJECTION 76%
INTRAVENOUS | Status: AC
  Administered 2016-10-10: 100 mL
  Filled 2016-10-10: qty 100

## 2016-10-10 MED ORDER — MOMETASONE FURO-FORMOTEROL FUM 100-5 MCG/ACT IN AERO
2.0000 | INHALATION_SPRAY | Freq: Two times a day (BID) | RESPIRATORY_TRACT | Status: DC
  Administered 2016-10-11: 2 via RESPIRATORY_TRACT
  Filled 2016-10-10: qty 8.8

## 2016-10-10 MED ORDER — HYDROCODONE-ACETAMINOPHEN 5-325 MG PO TABS
1.0000 | ORAL_TABLET | Freq: Four times a day (QID) | ORAL | Status: DC | PRN
  Administered 2016-10-11: 1 via ORAL
  Filled 2016-10-10: qty 1

## 2016-10-10 MED ORDER — SALINE SPRAY 0.65 % NA SOLN
2.0000 | Freq: Three times a day (TID) | NASAL | Status: DC | PRN
  Filled 2016-10-10: qty 44

## 2016-10-10 MED ORDER — ALPRAZOLAM 0.25 MG PO TABS: 0.2500 mg | ORAL_TABLET | Freq: Two times a day (BID) | ORAL | Status: DC | PRN

## 2016-10-10 MED ORDER — PANTOPRAZOLE SODIUM 20 MG PO TBEC
20.0000 mg | DELAYED_RELEASE_TABLET | Freq: Every day | ORAL | Status: DC
Start: 2016-10-11 — End: 2016-10-11
  Administered 2016-10-11: 20 mg via ORAL
  Filled 2016-10-10: qty 1

## 2016-10-10 MED ORDER — MORPHINE SULFATE (PF) 4 MG/ML IV SOLN
4.0000 mg | Freq: Once | INTRAVENOUS | Status: AC
  Administered 2016-10-10: 4 mg via INTRAVENOUS
  Filled 2016-10-10: qty 1

## 2016-10-10 MED ORDER — POLYETHYLENE GLYCOL 3350 17 G PO PACK: 1.0000 | PACK | Freq: Two times a day (BID) | ORAL | Status: DC | PRN

## 2016-10-10 MED ORDER — DEXTROMETHORPHAN-GUAIFENESIN 10-200 MG/5ML PO LIQD: Freq: Two times a day (BID) | ORAL | Status: DC | PRN

## 2016-10-10 MED ORDER — TRAMADOL HCL 50 MG PO TABS: 100.0000 mg | ORAL_TABLET | Freq: Every day | ORAL | Status: DC

## 2016-10-10 MED ORDER — DOXYCYCLINE HYCLATE 100 MG PO TABS
100.0000 mg | ORAL_TABLET | Freq: Two times a day (BID) | ORAL | Status: DC
  Administered 2016-10-10 – 2016-10-11 (×2): 100 mg via ORAL
  Filled 2016-10-10 (×2): qty 1

## 2016-10-10 MED ORDER — MORPHINE SULFATE (PF) 4 MG/ML IV SOLN
2.0000 mg | Freq: Once | INTRAVENOUS | Status: AC
  Administered 2016-10-10: 2 mg via INTRAVENOUS
  Filled 2016-10-10: qty 1

## 2016-10-10 MED ORDER — CYCLOSPORINE 0.05 % OP EMUL
1.0000 [drp] | Freq: Two times a day (BID) | OPHTHALMIC | Status: DC
  Administered 2016-10-10 – 2016-10-11 (×2): 1 [drp] via OPHTHALMIC
  Filled 2016-10-10 (×2): qty 1

## 2016-10-10 MED ORDER — ACETAMINOPHEN 325 MG PO TABS: 650.0000 mg | ORAL_TABLET | ORAL | Status: DC | PRN

## 2016-10-10 MED ORDER — HEPARIN SODIUM (PORCINE) 5000 UNIT/ML IJ SOLN
5000.0000 [IU] | Freq: Three times a day (TID) | INTRAMUSCULAR | Status: DC
  Administered 2016-10-10 – 2016-10-11 (×2): 5000 [IU] via SUBCUTANEOUS
  Filled 2016-10-10 (×2): qty 1

## 2016-10-10 MED ORDER — IPRATROPIUM BROMIDE 0.02 % IN SOLN: 0.5000 mg | Freq: Four times a day (QID) | RESPIRATORY_TRACT | Status: DC | PRN

## 2016-10-10 MED ORDER — CLOPIDOGREL BISULFATE 75 MG PO TABS
75.0000 mg | ORAL_TABLET | ORAL | Status: DC
  Administered 2016-10-11: 75 mg via ORAL
  Filled 2016-10-10: qty 1

## 2016-10-10 MED ORDER — DICLOFENAC SODIUM 1 % TD GEL
2.0000 g | Freq: Two times a day (BID) | TRANSDERMAL | Status: DC | PRN
  Filled 2016-10-10: qty 100

## 2016-10-10 MED ORDER — HYDROCODONE-ACETAMINOPHEN 5-325 MG PO TABS
1.0000 | ORAL_TABLET | Freq: Once | ORAL | Status: DC
  Filled 2016-10-10: qty 1

## 2016-10-10 MED ORDER — FLUTICASONE PROPIONATE 50 MCG/ACT NA SUSP
2.0000 | Freq: Every day | NASAL | Status: DC | PRN
  Filled 2016-10-10: qty 16

## 2016-10-10 MED ORDER — LEVOTHYROXINE SODIUM 25 MCG PO TABS
25.0000 ug | ORAL_TABLET | Freq: Every day | ORAL | Status: DC
  Administered 2016-10-11: 25 ug via ORAL
  Filled 2016-10-10: qty 1

## 2016-10-10 MED ORDER — ALBUTEROL SULFATE (2.5 MG/3ML) 0.083% IN NEBU: 2.5000 mg | INHALATION_SOLUTION | Freq: Four times a day (QID) | RESPIRATORY_TRACT | Status: DC | PRN

## 2016-10-10 MED ORDER — LORATADINE 10 MG PO TABS
10.0000 mg | ORAL_TABLET | Freq: Every day | ORAL | Status: DC
  Administered 2016-10-11: 10 mg via ORAL
  Filled 2016-10-10: qty 1

## 2016-10-10 MED ORDER — ASPIRIN 81 MG PO CHEW
324.0000 mg | CHEWABLE_TABLET | Freq: Once | ORAL | Status: AC
  Filled 2016-10-10: qty 4

## 2016-10-10 MED ORDER — SENNOSIDES-DOCUSATE SODIUM 8.6-50 MG PO TABS
2.0000 | ORAL_TABLET | Freq: Every day | ORAL | Status: DC
  Administered 2016-10-10: 2 via ORAL
  Filled 2016-10-10: qty 2

## 2016-10-10 MED ORDER — IOPAMIDOL (ISOVUE-370) INJECTION 76%
INTRAVENOUS | Status: AC
  Filled 2016-10-10: qty 100

## 2016-10-10 MED ORDER — TRAZODONE HCL 150 MG PO TABS
300.0000 mg | ORAL_TABLET | Freq: Every day | ORAL | Status: DC
  Administered 2016-10-10: 300 mg via ORAL
  Filled 2016-10-10: qty 2

## 2016-10-10 MED ORDER — METOPROLOL TARTRATE 12.5 MG HALF TABLET
12.5000 mg | ORAL_TABLET | Freq: Two times a day (BID) | ORAL | Status: DC
  Administered 2016-10-10 – 2016-10-11 (×2): 12.5 mg via ORAL
  Filled 2016-10-10 (×2): qty 1

## 2016-10-10 MED ORDER — PREDNISONE 20 MG PO TABS
40.0000 mg | ORAL_TABLET | Freq: Every day | ORAL | Status: DC
  Administered 2016-10-10 – 2016-10-11 (×2): 40 mg via ORAL
  Filled 2016-10-10 (×2): qty 2

## 2016-10-10 MED ORDER — TAMSULOSIN HCL 0.4 MG PO CAPS
0.4000 mg | ORAL_CAPSULE | Freq: Every day | ORAL | Status: DC
  Administered 2016-10-10 – 2016-10-11 (×2): 0.4 mg via ORAL
  Filled 2016-10-10 (×2): qty 1

## 2016-10-10 MED ORDER — ONDANSETRON HCL 4 MG/2ML IJ SOLN: 4.0000 mg | Freq: Four times a day (QID) | INTRAMUSCULAR | Status: DC | PRN

## 2016-10-10 NOTE — ED Notes (Signed)
Patient transported to CT 

## 2016-10-10 NOTE — H&P (Signed)
History and Physical    [name] [name] [id number] DOB: 1941/03/27 DOA: 10/10/2016  PCP: Lesia Hausen, PA  Patient coming from: [facility], assisted living   Chief Complaint: chest pain  HPI: [name] [name] is a 76 y.o. male with medical history significant of COPD, CAD, HTN, BPH, hypothyroidism who presents with left-sided chest pain that started this morning with radiation down to the left arm. He was at rest in bed when this started. He rated it about 8 out of 10 in severity. He describes it as a sharp pain that is reproducible with palpation, worsens with deep breath and cough. He states that he has been coughing and having brown productive cough that started today. He does have COPD and is oxygen dependent at baseline. He admits to shortness of breath with exertion. No nausea or vomiting, no abdominal pain. He does have chronic hip joint pain due to arthritis. Currently, he rates his chest pain 4 out of 10, which has improved with morphine and Norco in the emergency department.  ED Course: Patient was given aspirin, morphine, Norco. D-dimer was obtained which was elevated. CT was negative for PE. [facility] is asked to to see patient for observation due to chest pain.  Review of Systems: As per HPI otherwise 10 point review of systems negative.   Past Medical History:  Diagnosis Date  . Allergic rhinitis, cause unspecified   . Arthritis    osteoarthritis  . Cancer ([place])    renal carcinoma  . COPD (chronic obstructive pulmonary disease) (HCC)    [name] FOLLOWS PT--PT USING OXYGEN 24 HRS A DAY  . COPD (chronic obstructive pulmonary disease) (HCC)    PULMONARY CLEARANCE FOR LEFT RENAL CRYOABLATION ON CHART FROM DR. CLANCE -07/26/11  . Coronary artery disease    CARDIAC CLEARANCE  OFFICE NOTE FOR LEFT CRYO ABLATION ON CHART FROM [name] -"RECENT STRESS TEST DID NOT SHOW ISCHEMIA" -"PRESUMED CAD DUE TO CORONARY CALCIFICATIONS"   . DEMENTIA    Alzheimer's,  senile dementia  . Family history of ischemic heart disease   . Family history of malignant neoplasm of gastrointestinal tract   . GERD (gastroesophageal reflux disease)   . Hypothyroidism   . Myocardial infarction   . Obesity hypoventilation syndrome ([place])    failed cpap/bipap trial  . Other and unspecified hyperlipidemia   . Other congenital hamartoses, not elsewhere classified   . Pneumonia    HX     . Shortness of breath   . Sleep apnea    OXYGEN 24 HR/DAY-NO CPAP  . Unspecified essential hypertension     Past Surgical History:  Procedure Laterality Date  . CARPAL TUNNEL RELEASE     right  . CHOLECYSTECTOMY  2009  . EYE SURGERY     bilateral cataracts  . JOINT REPLACEMENT     bilateral knee replacements  . KNEE ARTHROPLASTY     2005 LEFT   AND 2007 RIGHT--[name]  . THYROIDECTOMY    . TUMOR REMOVAL  09/14/2011     reports that he has never smoked. He has never used smokeless tobacco. He reports that he does not drink alcohol or use drugs.  No Known Allergies  Family History  Problem Relation Age of Onset  . Cancer Other     colon  . Heart disease Other     CAD, MI and Heart Attack    Prior to Admission medications   Medication Sig Start Date End Date Taking? Authorizing Provider  albuterol (PROVENTIL) (2.5 MG/3ML) 0.083% nebulizer solution Take 2.5 mg by nebulization every 6 (six) hours as needed for wheezing or shortness of breath. Reported on 03/13/2016   Yes Historical Provider, MD  ALPRAZolam Duanne Moron) 0.25 MG tablet Take 0.25 mg by mouth every 12 (twelve) hours as needed for anxiety.   Yes Historical Provider, MD  calcitRIOL (ROCALTROL) 0.5 MCG capsule Take 0.5 mcg by mouth daily.   Yes Historical Provider, MD  calcium carbonate (TUMS) 500 MG chewable tablet Chew 2 tablets by mouth daily as needed for indigestion or heartburn.   Yes Historical Provider, MD  Calcium Carbonate-Vitamin D (CALCIUM PLUS VITAMIN D PO) Take 1 tablet by mouth 2 (two) times daily.    Yes Historical Provider, MD  carboxymethylcellulose 1 % ophthalmic solution Place 1 drop into both eyes 2 (two) times daily.   Yes Historical Provider, MD  cetirizine (ZYRTEC) 10 MG tablet Take 10 mg by mouth daily.   Yes Historical Provider, MD  clopidogrel (PLAVIX) 75 MG tablet Take 75 mg by mouth every morning.    Yes Historical Provider, MD  cyanocobalamin 500 MCG tablet Take 500 mcg by mouth daily.   Yes Historical Provider, MD  cycloSPORINE (RESTASIS) 0.05 % ophthalmic emulsion Place 1 drop into both eyes 2 (two) times daily.   Yes Historical Provider, MD  Dextromethorphan-Guaifenesin (DIABETIC TUSSIN MAX ST PO) Take 5 mLs by mouth 2 (two) times daily as needed (cough).   Yes Historical Provider, MD  diclofenac sodium (VOLTAREN) 1 % GEL Apply 2 g topically 2 (two) times daily as needed (pain).    Yes Historical Provider, MD  fluticasone (FLONASE) 50 MCG/ACT nasal spray Place 2 sprays into the nose daily as needed for allergies.    Yes Historical Provider, MD  HYDROcodone-acetaminophen (NORCO/VICODIN) 5-325 MG tablet Take 1 tablet by mouth every 6 (six) hours as needed for moderate pain. 02/27/16  Yes Domenic Polite, MD  ipratropium (ATROVENT) 0.02 % nebulizer solution Take 0.5 mg by nebulization every 6 (six) hours as needed for wheezing or shortness of breath.   Yes Historical Provider, MD  levothyroxine (SYNTHROID, LEVOTHROID) 200 MCG tablet Take 200 mcg by mouth daily before breakfast.   Yes Historical Provider, MD  levothyroxine (SYNTHROID, LEVOTHROID) 25 MCG tablet Take 25 mcg by mouth daily before breakfast.   Yes Historical Provider, MD  metoprolol tartrate (LOPRESSOR) 25 MG tablet Take 12.5 mg by mouth 2 (two) times daily. Hold for systolic blood pressure [id number]   Yes Historical Provider, MD  mometasone-formoterol (DULERA) 100-5 MCG/ACT AERO Inhale 2 puffs into the lungs 2 (two) times daily.   Yes Historical Provider, MD  nystatin cream (MYCOSTATIN) Apply 1 application topically 2 (two)  times daily as needed for dry skin.   Yes Historical Provider, MD  OXYGEN Inhale into the lungs at bedtime.   Yes Historical Provider, MD  oxymetazoline (AFRIN) 0.05 % nasal spray Place 1 spray into both nostrils 2 (two) times daily as needed for congestion.   Yes Historical Provider, MD  pantoprazole (PROTONIX) 20 MG tablet Take 1 tablet (20 mg total) by mouth daily. 06/24/16  Yes Lacretia Leigh, MD  polyethylene glycol powder (GLYCOLAX/MIRALAX) powder Take 1 Container by mouth 2 (two) times daily as needed for mild constipation. Mix 1 capful (17g) in 8 ounces of fluid and drink    Yes Historical Provider, MD  sennosides-docusate sodium (SENOKOT-S) 8.6-50 MG tablet Take 2 tablets by mouth at bedtime.   Yes Historical Provider, MD  sodium chloride (OCEAN) 0.65 %  SOLN nasal spray Place 2 sprays into both nostrils 3 (three) times daily as needed for congestion.   Yes Historical Provider, MD  Sodium Chloride, Inhalant, 7 % NEBU Inhale into the lungs 2 (two) times daily.   Yes Historical Provider, MD  Tamsulosin HCl (FLOMAX) 0.4 MG CAPS Take 0.4 mg by mouth daily. Hold if SBP < 100   Yes Historical Provider, MD  traMADol (ULTRAM) 50 MG tablet Take 100 mg by mouth daily.    Yes Historical Provider, MD  traZODone (DESYREL) 150 MG tablet Take 300 mg by mouth at bedtime.   Yes Historical Provider, MD  senna-docusate (SENOKOT-S) 8.6-50 MG tablet Take 1 tablet by mouth 2 (two) times daily. Patient not taking: Reported on 10/10/2016 02/27/16   Domenic Polite, MD    Physical Exam: Vitals:   10/10/16 1146 10/10/16 1315 10/10/16 1415 10/10/16 1445  BP: 115/71 131/85 124/60 (!) 124/54  Pulse: 67 61 62 72  Resp: 20 19 23 19   Temp: 98.3 F (36.8 C)     TempSrc: Oral     SpO2: 100% 99% 98% 100%  Weight:      Height:        Constitutional: NAD, calm, comfortable Eyes: PERRL, lids and conjunctivae normal ENMT: Mucous membranes are moist. Posterior pharynx clear of any exudate or lesions. No teeth.  Neck:  normal, supple, no masses, no thyromegaly Respiratory: Diminished breath sounds but clear otherwise. Normal respiratory effort. No accessory muscle use. On nasal cannula O2 Cardiovascular: Regular rate and rhythm, no murmurs / rubs / gallops. No extremity edema. Chest pain is reproducible to palpation Abdomen: no tenderness, no masses palpated. No hepatosplenomegaly. Bowel sounds positive. Soft Musculoskeletal: no clubbing / cyanosis. No joint deformity upper and lower extremities. Good ROM, no contractures. Normal muscle tone.  Skin: +Erythematous skin lesions on bilateral shin, without drainage. Patient states this is chronic Neurologic: CN 2-12 grossly intact. Strength 5/5 in all 4.  Psychiatric: Normal judgment and insight. Alert and oriented x 3. Normal mood.   Labs on Admission: I have personally reviewed following labs and imaging studies  CBC:  Recent Labs Lab 10/07/16 1105 10/10/16 1209  WBC 7.8 7.2  NEUTROABS 6.1 5.2  HGB 12.6* 11.9*  HCT 37.0* 36.0*  MCV 97.0 99.2  PLT 188.0 [id number]   Basic Metabolic Panel:  Recent Labs Lab 10/07/16 1105 10/10/16 1209  NA 142 142  K 4.5 3.9  CL 102 106  CO2 32 26  GLUCOSE 71 89  BUN 22 14  CREATININE 1.24 1.29*  CALCIUM 8.6 8.4*   GFR: Estimated Creatinine Clearance: 66.4 mL/min (by C-G formula based on SCr of 1.29 mg/dL (H)). Liver Function Tests:  Recent Labs Lab 10/10/16 1209  AST 23  ALT 9*  ALKPHOS 57  BILITOT 0.5  PROT 5.9*  ALBUMIN 3.2*   No results for input(s): LIPASE, AMYLASE in the last 168 hours. No results for input(s): AMMONIA in the last 168 hours. Coagulation Profile: No results for input(s): INR, PROTIME in the last 168 hours. Cardiac Enzymes: No results for input(s): CKTOTAL, CKMB, CKMBINDEX, TROPONINI in the last 168 hours. BNP (last 3 results)  Recent Labs  10/07/16 1105  PROBNP 72.0   HbA1C: No results for input(s): HGBA1C in the last 72 hours. CBG: No results for input(s): GLUCAP in the  last 168 hours. Lipid Profile: No results for input(s): CHOL, HDL, LDLCALC, TRIG, CHOLHDL, LDLDIRECT in the last 72 hours. Thyroid Function Tests: No results for input(s): TSH, T4TOTAL, FREET4, T3FREE,  THYROIDAB in the last 72 hours. Anemia Panel: No results for input(s): VITAMINB12, FOLATE, FERRITIN, TIBC, IRON, RETICCTPCT in the last 72 hours. Urine analysis:    Component Value Date/Time   COLORURINE YELLOW 06/24/2016 1406   APPEARANCEUR CLOUDY (A) 06/24/2016 1406   LABSPEC 1.023 06/24/2016 1406   PHURINE 8.5 (H) 06/24/2016 1406   GLUCOSEU NEGATIVE 06/24/2016 1406   HGBUR SMALL (A) 06/24/2016 1406   BILIRUBINUR NEGATIVE 06/24/2016 1406   KETONESUR NEGATIVE 06/24/2016 1406   PROTEINUR NEGATIVE 06/24/2016 1406   UROBILINOGEN 0.2 07/10/2015 1630   NITRITE NEGATIVE 06/24/2016 1406   LEUKOCYTESUR TRACE (A) 06/24/2016 1406   Sepsis Labs: !!!!!!!!!!!!!!!!!!!!!!!!!!!!!!!!!!!!!!!!!!!! @LABRCNTIP (procalcitonin:4,lacticidven:4) )No results found for this or any previous visit (from the past 240 hour(s)).   Radiological Exams on Admission: Ct Angio Chest Pe W Or Wo Contrast  Result Date: 10/10/2016 CLINICAL DATA:  Chest pain and shortness of breath. Elevated D-dimer. History of COPD and renal cell carcinoma. EXAM: CT ANGIOGRAPHY CHEST WITH CONTRAST TECHNIQUE: Multidetector CT imaging of the chest was performed using the standard protocol during bolus administration of intravenous contrast. Multiplanar CT image reconstructions and MIPs were obtained to evaluate the vascular anatomy. CONTRAST:  75 mL Isovue 370 COMPARISON:  02/23/2016 FINDINGS: Cardiovascular: Pulmonary arterial opacification is adequate, however motion artifact limits segmental and subsegmental assessment in the lower lobes, particularly on the right. Abnormal appearance of a right upper lobe apical subsegmental branch is unchanged and may reflect the sequelae of a prior embolus. No definite acute segmental or more proximal  emboli are identified. There is thoracic aortic atherosclerosis with unchanged ascending aortic ectasia measuring 4.0 cm in diameter. Three-vessel coronary artery atherosclerosis is noted. No pericardial effusion. Mediastinum/Nodes: No enlarged axillary, mediastinal, or hilar lymph nodes are identified. The thyroid, trachea, and esophagus are unremarkable. Lungs/Pleura: No pleural effusion or pneumothorax. Evaluation of the lung parenchyma is limited in the bases by motion artifact. Additionally, the posterior right lung base was incompletely imaged. There is elevation of the left hemidiaphragm with overlying lingular and lower lobe atelectasis. Basilar right lower lobe atelectasis is present as well. No lung mass. Upper Abdomen: No acute abnormality. Musculoskeletal: Mild bilateral gynecomastia. Partially visualized right shoulder arthroplasty. Cervicothoracic spondylosis. Review of the MIP images confirms the above findings. IMPRESSION: 1. No acute pulmonary emboli identified, however lower lobe evaluation is limited by motion artifact. 2. Bibasilar atelectasis. 3. Aortic atherosclerosis with unchanged ascending aortic ectasia. Electronically Signed   By: Logan Bores M.D.   On: 10/10/2016 15:56   Dg Chest Port 1 View  Result Date: 10/10/2016 CLINICAL DATA:  75 year old male with central and left-sided chest pain radiating into the left arm since this morning. EXAM: PORTABLE CHEST 1 VIEW COMPARISON:  Chest x-ray 10/07/2016. FINDINGS: Lung volumes are very low. There are some bibasilar opacities favored to reflect areas of mild subsegmental atelectasis. No definite consolidative airspace disease or pleural effusions. No evidence of pulmonary edema. Borderline cardiomegaly, likely accentuated by the patient's low lung volumes and portable AP technique. Upper mediastinal contours are within normal limits. Status post right shoulder arthroplasty. IMPRESSION: 1. Low lung volumes with some bibasilar subsegmental  atelectasis, but no radiographic evidence of acute cardiopulmonary disease. Electronically Signed   By: Vinnie Langton M.D.   On: 10/10/2016 12:34    EKG: Independently reviewed. NSR, left axis deviation, no ST elevation   Assessment/Plan Principal Problem:   Atypical chest pain Active Problems:   Chronic respiratory failure with hypoxia and hypercapnia (HCC)   COPD (chronic obstructive pulmonary disease) (  [place])   Chronic diastolic CHF (congestive heart failure) (HCC)   Bronchitis   CKD (chronic kidney disease) stage 3, GFR 30-59 ml/min   HTN (hypertension)   Hypothyroid  Atypical chest pain -Chest pain is sharp, left sided, reproducible with palpation, deep breath, and cough. Improved with morphine. Heart score 3. EKG unremarkable. CTA negative for PE.  -Continue Plavix -Trend troponin   Acute bronchitis  -Cough with brown sputum -Doxycycline, prednisone -Continue home dulera and nebs   COPD, without exacerbation  -Sees Dr. Melvyn Novas on prn follow up basis  -Continue home dulera and nebs   Chronic hypoxemic respiratory failure -Uses 3L [place] on and off at baseline   Chronic diastolic heart failure -Echo in 02/2016 with EF 0000000, grade 1 diastolic dysfunction -Stable   CKD stage 3 -Baseline Cr 1.2 -Stable   Chronic normocytic anemia -Baseline Hgb 12 -Stable   Essential hypertension -Continue Lopressor  Hypothyroidism -Continue Synthroid  GERD -Continue PPI   Insomnia -Continue trazodone  BPH -Continue flomax   Anxiety -Continue home Ativan q12h prn   DVT prophylaxis: subq hep Code Status: Full  Family Communication: no family at bedside Disposition Plan: pending further improvement, likely back to assisted living facility  Consults called: none   Admission status: observation  It is my clinical opinion that referral for OBSERVATION is reasonable and necessary in this 75 y.o. year old male  presenting with symptoms of chest pain, bronchitis   in the  context of PMH including essential hypertension, COPD, COPD, hypothyroidism  The aforementioned taken together are felt to place the patient at high risk for further  clinical deterioration. However it is anticipated that the patient may be medically stable for discharge from the hospital within 24 to 48 hours.   Dessa Phi, DO [facility] [email or web address] Password [facility] 10/10/2016, 4:28 PM

## 2016-10-10 NOTE — ED Provider Notes (Addendum)
[facility] DEPT Provider Note   CSN: [id number] Arrival date & time: 10/10/16  1139     History   Chief Complaint Chief Complaint  Patient presents with  . Chest Pain    HPI [name] [name] is a 75 y.o. male.  The history is provided by the patient.  Chest Pain   This is a new problem. Episode onset: around 9 am. The problem occurs constantly. The problem has been gradually improving. Associated with: started while sitting in bed but not made better by sitting up. The pain is present in the substernal region. The pain is at a severity of 4/10. The pain is moderate. The quality of the pain is described as pressure-like and heavy. The pain radiates to the left arm. Duration of episode(s) is 2 hours. Exacerbated by: can't say if anything makes it worse. Associated symptoms comments: Pt states over the last week he has had more coughing which occasionally is productive but denies fever.  He has been using breathing treatments at home.  Also nasal congestion but worsening SOB.  Pt states the SOB is really what is bothering him but also states he has never had pain like this.  Feels swelling in legs is unchanged.  Unknown med changes. He has tried nothing for the symptoms. The treatment provided no relief. Risk factors include being elderly.  His past medical history is significant for CAD and COPD.  Pertinent negatives for past medical history include no diabetes and no DVT. Past medical history comments: no intervention on CAD  Procedure history is positive for stress echo.  Procedure history is negative for cardiac catheterization.    Past Medical History:  Diagnosis Date  . Allergic rhinitis, cause unspecified   . Arthritis    osteoarthritis  . Cancer ([place])    renal carcinoma  . COPD (chronic obstructive pulmonary disease) (HCC)    [name] FOLLOWS PT--PT USING OXYGEN 24 HRS A DAY  . COPD (chronic obstructive pulmonary disease) (HCC)    PULMONARY CLEARANCE FOR LEFT  RENAL CRYOABLATION ON CHART FROM DR. CLANCE -07/26/11  . Coronary artery disease    CARDIAC CLEARANCE  OFFICE NOTE FOR LEFT CRYO ABLATION ON CHART FROM [name] -"RECENT STRESS TEST DID NOT SHOW ISCHEMIA" -"PRESUMED CAD DUE TO CORONARY CALCIFICATIONS"   . DEMENTIA    Alzheimer's, senile dementia  . Family history of ischemic heart disease   . Family history of malignant neoplasm of gastrointestinal tract   . GERD (gastroesophageal reflux disease)   . Hypothyroidism   . Myocardial infarction   . Obesity hypoventilation syndrome ([place])    failed cpap/bipap trial  . Other and unspecified hyperlipidemia   . Other congenital hamartoses, not elsewhere classified   . Pneumonia    HX     . Shortness of breath   . Sleep apnea    OXYGEN 24 HR/DAY-NO CPAP  . Unspecified essential hypertension     Patient Active Problem List   Diagnosis Date Noted  . Chronic diastolic CHF (congestive heart failure) ([place]) 03/03/2016  . Coronary artery disease 03/03/2016  . Thoracic aortic aneurysm ([place]) 03/03/2016  . Dementia 02/23/2016  . CAP (community acquired pneumonia) 02/23/2016  . COPD (chronic obstructive pulmonary disease) ([place]) 02/23/2016  . Cellulitis, leg 02/23/2016  . Oliguria 02/23/2016  . Respiratory alkalosis 02/23/2016  . Chest pain 02/23/2016  . Acute on chronic renal failure ([place]) 02/23/2016  . Chronic respiratory failure with hypoxia and hypercapnia ([place]) 09/27/2015  . Morbid obesity ([place]) 07/13/2015  .  Dyspnea on exertion 07/12/2015  . Healthcare-associated pneumonia 01/17/2012  . OSA (obstructive sleep apnea) 11/15/2011  . Renal mass, left 09/14/2011  . DISORDERS OF DIAPHRAGM 07/02/2010  . OBESITY HYPOVENTILATION SYNDROME 02/14/2009  . Multiple pulmonary nodules 02/14/2009  . CHOLELITHIASIS 05/04/2008  . Nonspecific (abnormal) findings on radiological and other examination of body structure 04/28/2008  . ABNORMAL CHEST XRAY 04/28/2008  . 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 HYPOPARATHYROIDISM 03/31/2008  . HYPOCALCEMIA 03/31/2008  . 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 DYSPNEA 10/01/2007  . DYSLIPIDEMIA 09/21/2007  . Hypertensive heart disease with CHF (congestive heart failure) ([place]) 09/21/2007  . Allergic rhinitis 09/21/2007    Past Surgical History:  Procedure Laterality Date  . CARPAL TUNNEL RELEASE     right  . CHOLECYSTECTOMY  2009  . EYE SURGERY     bilateral cataracts  . JOINT REPLACEMENT     bilateral knee replacements  . KNEE ARTHROPLASTY     2005 LEFT   AND 2007 RIGHT--[name]  . THYROIDECTOMY    . TUMOR REMOVAL  09/14/2011       Home Medications    Prior to Admission medications   Medication Sig Start Date End Date Taking? Authorizing Provider  acetaminophen (TYLENOL) 500 MG tablet Take 1,000 mg by mouth 2 (two) times daily.    Historical Provider, MD  albuterol (PROVENTIL) (2.5 MG/3ML) 0.083% nebulizer solution Take 2.5 mg by nebulization every 6 (six) hours as needed for wheezing or shortness of breath. Reported on 03/13/2016    Historical Provider, MD  Bismuth Tribromoph-Petrolatum (XEROFORM PETROLAT GAUZE 1"X8" EX) Apply topically. Apply to left and right lower leg every day    Historical Provider, MD  Calcium Carbonate-Vitamin D (CALCIUM PLUS VITAMIN D PO) Take 1 tablet by mouth 2 (two) times daily.    Historical Provider, MD  cetirizine (ZYRTEC) 10 MG tablet Take 10 mg by mouth daily.    Historical Provider, MD  clopidogrel (PLAVIX) 75 MG tablet Take 75 mg by mouth every morning.     Historical Provider, MD  cyanocobalamin 500 MCG tablet Take 500 mcg by mouth daily.    Historical Provider, MD  cycloSPORINE (RESTASIS) 0.05 % ophthalmic emulsion Place 1 drop into both eyes 2 (two) times daily.    Historical Provider, MD  diclofenac sodium (VOLTAREN) 1 % GEL Apply topically 2 (two) times daily as needed.    Historical Provider, MD  fluticasone (FLONASE) 50 MCG/ACT nasal spray Place 1 spray into the nose daily as  needed for allergies.     Historical Provider, MD  HYDROcodone-acetaminophen (NORCO/VICODIN) 5-325 MG tablet Take 1 tablet by mouth every 6 (six) hours as needed for moderate pain. 02/27/16   Domenic Polite, MD  ipratropium (ATROVENT) 0.02 % nebulizer solution Take 0.5 mg by nebulization every 6 (six) hours as needed for wheezing or shortness of breath.    Historical Provider, MD  levothyroxine (SYNTHROID, LEVOTHROID) 200 MCG tablet Take 200 mcg by mouth daily before breakfast.    Historical Provider, MD  levothyroxine (SYNTHROID, LEVOTHROID) 25 MCG tablet Take 25 mcg by mouth daily before breakfast.    Historical Provider, MD  metoprolol tartrate (LOPRESSOR) 25 MG tablet Take 12.5 mg by mouth 2 (two) times daily. Hold for systolic blood pressure [id number]    Historical Provider, MD  mometasone-formoterol (DULERA) 100-5 MCG/ACT AERO Inhale 2 puffs into the lungs 2 (two) times daily.    Historical Provider, MD  OXYGEN Inhale into the lungs at bedtime.    Historical Provider, MD  pantoprazole (PROTONIX) 20 MG tablet Take 1 tablet (20 mg total) by mouth daily. 06/24/16   Lacretia Leigh, MD  polyethylene glycol powder (GLYCOLAX/MIRALAX) powder Take 1 Container by mouth 2 (two) times daily. Mix 1 capful (17g) in 8 ounces of fluid and drink    Historical Provider, MD  senna-docusate (SENOKOT-S) 8.6-50 MG tablet Take 1 tablet by mouth 2 (two) times daily. 02/27/16   Domenic Polite, MD  Sodium Chloride, Inhalant, 7 % NEBU Inhale into the lungs 2 (two) times daily.    Historical Provider, MD  Tamsulosin HCl (FLOMAX) 0.4 MG CAPS Take 0.4 mg by mouth daily. Hold if SBP < 100    Historical Provider, MD  traMADol (ULTRAM) 50 MG tablet Take 100 mg by mouth 2 (two) times daily.    Historical Provider, MD  traZODone (DESYREL) 150 MG tablet Take 300 mg by mouth at bedtime.    Historical Provider, MD    Family History Family History  Problem Relation Age of Onset  . Cancer Other     colon  . Heart disease Other     CAD, MI  and Heart Attack    Social History Social History  Substance Use Topics  . Smoking status: Never Smoker  . Smokeless tobacco: Never Used  . Alcohol use No     Allergies   Patient has no known allergies.   Review of Systems Review of Systems  Cardiovascular: Positive for chest pain.  All other systems reviewed and are negative.    Physical Exam Updated Vital Signs BP 115/71 (BP Location: Right Arm)   Pulse 67   Temp 98.3 F (36.8 C) (Oral)   Resp 20   Ht 6\' 3"  (1.905 m)   Wt 260 lb (117.9 kg)   SpO2 100%   BMI 32.50 kg/m   Physical Exam  Constitutional: He is oriented to person, place, and time. He appears well-developed and well-nourished. No distress.  HENT:  Head: Normocephalic and atraumatic.  Mouth/Throat: Oropharynx is clear and moist.  Eyes: Conjunctivae and EOM are normal. Pupils are equal, round, and reactive to light.  Neck: Normal range of motion. Neck supple.  Cardiovascular: Normal rate, regular rhythm and intact distal pulses.   No murmur heard. Pulmonary/Chest: Effort normal and breath sounds normal. No respiratory distress. He has no wheezes. He has no rales.  Abdominal: Soft. He exhibits no distension. There is no tenderness. There is no rebound and no guarding.  Musculoskeletal: Normal range of motion. He exhibits edema. He exhibits no tenderness.  2+ edema in bilateral lower ext and skin changes consistent with venous stasis bilaterally  Neurological: He is alert and oriented to person, place, and time.  Skin: Skin is warm and dry. No rash noted. No erythema.  Psychiatric: He has a normal mood and affect. His behavior is normal.  Nursing note and vitals reviewed.    ED Treatments / Results  Labs (all labs ordered are listed, but only abnormal results are displayed) Labs Reviewed  CBC WITH DIFFERENTIAL/PLATELET - Abnormal; Notable for the following:       Result Value   RBC 3.63 (*)    Hemoglobin 11.9 (*)    HCT 36.0 (*)    All other  components within normal limits  D-DIMER, QUANTITATIVE (NOT AT [facility]) - Abnormal; Notable for the following:    D-Dimer, Quant 0.88 (*)    All other components within normal limits  COMPREHENSIVE METABOLIC PANEL  BRAIN NATRIURETIC PEPTIDE  I-STAT TROPOININ, ED  EKG  EKG Interpretation  Date/Time:  Thursday October 10 2016 11:44:29 EST Ventricular Rate:  65 PR Interval:    QRS Duration: 92 QT Interval:  403 QTC Calculation: 419 R Axis:   -17 Text Interpretation:  Sinus rhythm Borderline left axis deviation Low voltage, precordial leads No significant change since last tracing Confirmed by Maryan Rued  MD, Loree Fee ([id number]) on 10/10/2016 12:12:27 PM       Radiology Ct Angio Chest Pe W Or Wo Contrast  Result Date: 10/10/2016 CLINICAL DATA:  Chest pain and shortness of breath. Elevated D-dimer. History of COPD and renal cell carcinoma. EXAM: CT ANGIOGRAPHY CHEST WITH CONTRAST TECHNIQUE: Multidetector CT imaging of the chest was performed using the standard protocol during bolus administration of intravenous contrast. Multiplanar CT image reconstructions and MIPs were obtained to evaluate the vascular anatomy. CONTRAST:  75 mL Isovue 370 COMPARISON:  02/23/2016 FINDINGS: Cardiovascular: Pulmonary arterial opacification is adequate, however motion artifact limits segmental and subsegmental assessment in the lower lobes, particularly on the right. Abnormal appearance of a right upper lobe apical subsegmental branch is unchanged and may reflect the sequelae of a prior embolus. No definite acute segmental or more proximal emboli are identified. There is thoracic aortic atherosclerosis with unchanged ascending aortic ectasia measuring 4.0 cm in diameter. Three-vessel coronary artery atherosclerosis is noted. No pericardial effusion. Mediastinum/Nodes: No enlarged axillary, mediastinal, or hilar lymph nodes are identified. The thyroid, trachea, and esophagus are unremarkable. Lungs/Pleura: No pleural  effusion or pneumothorax. Evaluation of the lung parenchyma is limited in the bases by motion artifact. Additionally, the posterior right lung base was incompletely imaged. There is elevation of the left hemidiaphragm with overlying lingular and lower lobe atelectasis. Basilar right lower lobe atelectasis is present as well. No lung mass. Upper Abdomen: No acute abnormality. Musculoskeletal: Mild bilateral gynecomastia. Partially visualized right shoulder arthroplasty. Cervicothoracic spondylosis. Review of the MIP images confirms the above findings. IMPRESSION: 1. No acute pulmonary emboli identified, however lower lobe evaluation is limited by motion artifact. 2. Bibasilar atelectasis. 3. Aortic atherosclerosis with unchanged ascending aortic ectasia. Electronically Signed   By: Logan Bores M.D.   On: 10/10/2016 15:56   Dg Chest Port 1 View  Result Date: 10/10/2016 CLINICAL DATA:  75 year old male with central and left-sided chest pain radiating into the left arm since this morning. EXAM: PORTABLE CHEST 1 VIEW COMPARISON:  Chest x-ray 10/07/2016. FINDINGS: Lung volumes are very low. There are some bibasilar opacities favored to reflect areas of mild subsegmental atelectasis. No definite consolidative airspace disease or pleural effusions. No evidence of pulmonary edema. Borderline cardiomegaly, likely accentuated by the patient's low lung volumes and portable AP technique. Upper mediastinal contours are within normal limits. Status post right shoulder arthroplasty. IMPRESSION: 1. Low lung volumes with some bibasilar subsegmental atelectasis, but no radiographic evidence of acute cardiopulmonary disease. Electronically Signed   By: Vinnie Langton M.D.   On: 10/10/2016 12:34    Procedures Procedures (including critical care time)  Medications Ordered in ED Medications  HYDROcodone-acetaminophen (NORCO/VICODIN) 5-325 MG per tablet 1 tablet (not administered)     Initial Impression / Assessment and  Plan / ED Course  I have reviewed the triage vital signs and the nursing notes.  Pertinent labs & imaging results that were available during my care of the patient were reviewed by me and considered in my medical decision making (see chart for details).  Clinical Course    Patient is a 75 year old gentleman presenting today with chest discomfort and shortness of breath.  Patient's story is mixed. He has had some coughing and nasal congestion with using breathing treatments but no fever and no significant productive cough. He has a history of coronary artery disease but has never had intervention. He also has severe COPD and wears oxygen 24 hours a day. He has been using nebulizer treatments for the last few weeks. Concern for potential COPD exacerbation however patient is not wheezing on exam and satting 100% versus CHF however there are no notable rales on exam versus ACS versus PE versus pneumonia. Patient is well-appearing and on EKG no obvious signs of STEMI. CBC, CMP, d-dimer, troponin, chest x-ray pending.  12:56 PM Pt given aspirin and morphine.  D-dimer elevated and CMP pending.  Trop neg.  Will wait for Cr until ordering CTA.   4:21 PM CTA neg for clot.  Will admit for chest pain r/o.  Final Clinical Impressions(s) / ED Diagnoses   Final diagnoses:  Atypical chest pain    New Prescriptions New Prescriptions   No medications on file     Blanchie Dessert, MD 10/10/16 [place], MD 10/10/16 [phone]

## 2016-10-10 NOTE — ED Triage Notes (Signed)
Pt. Coming from home via [facility] for chest pain starting around 1100 that radiates into left arm. Pt. sts pain is worse with movement. Pt. Given 324 ASA and 1 nitro with relief. Pt. BP dropped significantly with nitro, so it was not re-dosed. Pt. Hx of COPD and has SOB at baseline with 3L oxygen via [place]. EMS reports 12 lead unremarkable and lung sounds clear. Pt. Hx of chronic pain.

## 2016-10-10 NOTE — ED Notes (Signed)
EKG given to Dr. Maryan Rued by Verline Lema, EMT.

## 2016-10-10 NOTE — ED Notes (Signed)
EDP at bedside  

## 2016-10-10 NOTE — ED Notes (Signed)
1st attempt to call report 

## 2016-10-11 ENCOUNTER — Other Ambulatory Visit: Payer: Self-pay

## 2016-10-11 DIAGNOSIS — J440 Chronic obstructive pulmonary disease with acute lower respiratory infection: Secondary | ICD-10-CM | POA: Diagnosis not present

## 2016-10-11 DIAGNOSIS — I5032 Chronic diastolic (congestive) heart failure: Secondary | ICD-10-CM | POA: Diagnosis not present

## 2016-10-11 DIAGNOSIS — J9611 Chronic respiratory failure with hypoxia: Secondary | ICD-10-CM | POA: Diagnosis not present

## 2016-10-11 DIAGNOSIS — J9612 Chronic respiratory failure with hypercapnia: Secondary | ICD-10-CM

## 2016-10-11 DIAGNOSIS — J209 Acute bronchitis, unspecified: Secondary | ICD-10-CM | POA: Diagnosis not present

## 2016-10-11 DIAGNOSIS — J40 Bronchitis, not specified as acute or chronic: Secondary | ICD-10-CM | POA: Diagnosis not present

## 2016-10-11 DIAGNOSIS — R0789 Other chest pain: Secondary | ICD-10-CM

## 2016-10-11 DIAGNOSIS — N183 Chronic kidney disease, stage 3 (moderate): Secondary | ICD-10-CM

## 2016-10-11 DIAGNOSIS — I10 Essential (primary) hypertension: Secondary | ICD-10-CM

## 2016-10-11 LAB — TROPONIN I

## 2016-10-11 MED ORDER — DOXYCYCLINE HYCLATE 100 MG PO TABS: 100.0000 mg | ORAL_TABLET | Freq: Two times a day (BID) | ORAL | 0 refills | Status: AC

## 2016-10-11 NOTE — Progress Notes (Signed)
Pt prepared d/c to SNF. IV dc'd. Vitals are stable. Report called to receiving facility. Pt to be transported by ambulance service.

## 2016-10-11 NOTE — Discharge Instructions (Signed)
Chest Wall Pain °Introduction °Chest wall pain is pain in or around the bones and muscles of your chest. Sometimes, an injury causes this pain. Sometimes, the cause may not be known. This pain may take several weeks or longer to get better. °Follow these instructions at home: °Pay attention to any changes in your symptoms. Take these actions to help with your pain: °· Rest as told by your doctor. °· Avoid activities that cause pain. Try not to use your chest, belly (abdominal), or side muscles to lift heavy things. °· If directed, apply ice to the painful area: °¨ Put ice in a plastic bag. °¨ Place a towel between your skin and the bag. °¨ Leave the ice on for 20 minutes, 2-3 times per day. °· Take over-the-counter and prescription medicines only as told by your doctor. °· Do not use tobacco products, including cigarettes, chewing tobacco, and e-cigarettes. If you need help quitting, ask your doctor. °· Keep all follow-up visits as told by your doctor. This is important. °Contact a doctor if: °· You have a fever. °· Your chest pain gets worse. °· You have new symptoms. °Get help right away if: °· You feel sick to your stomach (nauseous) or you throw up (vomit). °· You feel sweaty or light-headed. °· You have a cough with phlegm (sputum) or you cough up blood. °· You are short of breath. °This information is not intended to replace advice given to you by your health care provider. Make sure you discuss any questions you have with your health care provider. °Document Released: 03/25/2008 Document Revised: 03/14/2016 Document Reviewed: 01/02/2015 °© 2017 [employer]vier ° °

## 2016-10-11 NOTE — Progress Notes (Signed)
CSW was consulted to transport patient back to ALF [facility]). Patient was under observations and needs assistance to go back home. Clinical Social Worker facilitated patient discharge including contacting patient family and facility to confirm patient discharge plans.  Clinical information faxed to facility and family agreeable with plan.  CSW arranged ambulance transport via [facility] to [facility] .  RN Lattie Haw to call [phone] for report prior to discharge.  Clinical Social Worker will sign off for now as social work intervention is no longer needed. Please consult [place] again if new need arises.  Rhea Pink, MSW, [place]

## 2016-10-11 NOTE — [place] FL2 (Signed)
[place] MEDICAID FL2 LEVEL OF CARE SCREENING TOOL     IDENTIFICATION  Patient Name: [name] [name] Birthdate: [birth year] Sex: male Admission Date (Current Location): 10/10/2016  [facility] and [place] Number:  Herbalist and Address:  [facility] [place], [place] [address], [place], [place] [address]      Provider Number: [id number]  Attending Physician Name and Address:  Louellen Molder, MD  Relative Name and Phone Number:       Current Level of Care: Hospital Recommended Level of Care: [place] Prior Approval Number:    Date Approved/Denied:   PASRR Number:    Discharge Plan: Other (Comment) [facility])    Current Diagnoses: Patient Active Problem List   Diagnosis Date Noted  . Musculoskeletal chest pain   . Bronchitis 10/10/2016  . CKD (chronic kidney disease) stage 3, GFR 30-59 ml/min 10/10/2016  . HTN (hypertension) 10/10/2016  . Hypothyroid 10/10/2016  . Chronic diastolic CHF (congestive heart failure) ([place]) 03/03/2016  . Coronary artery disease 03/03/2016  . Thoracic aortic aneurysm ([place]) 03/03/2016  . Dementia 02/23/2016  . COPD (chronic obstructive pulmonary disease) ([place]) 02/23/2016  . Oliguria 02/23/2016  . Atypical chest pain 02/23/2016  . Chronic respiratory failure with hypoxia and hypercapnia ([place]) 09/27/2015  . Morbid obesity ([place]) 07/13/2015  . Dyspnea on exertion 07/12/2015  . OSA (obstructive sleep apnea) 11/15/2011  . Renal mass, left 09/14/2011  . DISORDERS OF DIAPHRAGM 07/02/2010  . OBESITY HYPOVENTILATION SYNDROME 02/14/2009  . Multiple pulmonary nodules 02/14/2009  . CHOLELITHIASIS 05/04/2008  . Nonspecific (abnormal) findings on radiological and other examination of body structure 04/28/2008  . ABNORMAL CHEST XRAY 04/28/2008  . 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 HYPOPARATHYROIDISM 03/31/2008  . HYPOCALCEMIA 03/31/2008  . 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  DYSLIPIDEMIA 09/21/2007  . Hypertensive heart disease with CHF (congestive heart failure) ([place]) 09/21/2007  . Allergic rhinitis 09/21/2007    Orientation RESPIRATION BLADDER Height & Weight     Self, Time, Situation, Place  Normal Continent Weight: 255 lb 3.2 oz (115.8 kg) Height:  6\' 3"  (190.5 cm)  BEHAVIORAL SYMPTOMS/MOOD NEUROLOGICAL BOWEL NUTRITION STATUS      Continent    AMBULATORY STATUS COMMUNICATION OF NEEDS Skin   Limited Assist Verbally Normal                       Personal Care Assistance Level of Assistance  Bathing, Feeding, Dressing Bathing Assistance: Limited assistance Feeding assistance: Independent Dressing Assistance: Limited assistance     Functional Limitations Info  Sight Sight Info: Adequate Hearing Info: Adequate Speech Info: Adequate    SPECIAL CARE FACTORS FREQUENCY  PT (By licensed PT), OT (By licensed OT)     PT Frequency: 5x week OT Frequency: 5x week            Contractures      Additional Factors Info  Code Status Code Status Info: Full code             Current Medications (10/11/2016):  This is the current hospital active medication list Current Facility-Administered Medications  Medication Dose Route Frequency Provider Last Rate Last Dose  . acetaminophen (TYLENOL) tablet 650 mg  650 mg Oral Q4H PRN Rich Fuchs Choi, DO      . albuterol (PROVENTIL) (2.5 MG/3ML) 0.083% nebulizer solution 2.5 mg  2.5 mg Nebulization Q6H PRN Shon Millet, DO      . ALPRAZolam Duanne Moron) tablet 0.25 mg  0.25  mg Oral Q12H PRN Shon Millet, DO      . clopidogrel (PLAVIX) tablet 75 mg  75 mg Oral [address] [place], DO   75 mg at 10/11/16 0701  . cycloSPORINE (RESTASIS) 0.05 % ophthalmic emulsion 1 drop  1 drop Both Eyes BID Shon Millet, DO   1 drop at 10/11/16 1021  . diclofenac sodium (VOLTAREN) 1 % transdermal gel 2 g  2 g Topical BID PRN Shon Millet, DO      . doxycycline  (VIBRA-TABS) tablet 100 mg  100 mg Oral Q12H Jennifer Chahn-Yang Choi, DO   100 mg at 10/11/16 1021  . fluticasone (FLONASE) 50 MCG/ACT nasal spray 2 spray  2 spray Each Nare Daily PRN Shon Millet, DO      . heparin injection 5,000 Units  5,000 Units Subcutaneous Q8H Shon Millet, DO   5,000 Units at 10/11/16 [phone]  . HYDROcodone-acetaminophen (NORCO/VICODIN) 5-325 MG per tablet 1 tablet  1 tablet Oral Q6H PRN Shon Millet, DO   1 tablet at 10/11/16 0845  . ipratropium (ATROVENT) nebulizer solution 0.5 mg  0.5 mg Nebulization Q6H PRN Shon Millet, DO      . levothyroxine (SYNTHROID, LEVOTHROID) tablet 200 mcg  200 mcg Oral QAC breakfast Shon Millet, DO   200 mcg at 10/11/16 [id number]  . levothyroxine (SYNTHROID, LEVOTHROID) tablet 25 mcg  25 mcg Oral QAC breakfast Shon Millet, DO   25 mcg at 10/11/16 [phone]  . loratadine (CLARITIN) tablet 10 mg  10 mg Oral Daily Shon Millet, DO   10 mg at 10/11/16 1022  . metoprolol tartrate (LOPRESSOR) tablet 12.5 mg  12.5 mg Oral BID Rich Fuchs Choi, DO   12.5 mg at 10/11/16 1022  . mometasone-formoterol (DULERA) 100-5 MCG/ACT inhaler 2 puff  2 puff Inhalation BID Shon Millet, DO   2 puff at 10/11/16 [phone]  . ondansetron (ZOFRAN) injection 4 mg  4 mg Intravenous Q6H PRN Shon Millet, DO      . pantoprazole (PROTONIX) EC tablet 20 mg  20 mg Oral Daily Rich Fuchs Choi, DO   20 mg at 10/11/16 1021  . polyethylene glycol (MIRALAX / GLYCOLAX) packet 17 g  1 packet Oral BID PRN Shon Millet, DO      . predniSONE (DELTASONE) tablet 40 mg  40 mg Oral Q breakfast Shon Millet, DO   40 mg at 10/11/16 0701  . senna-docusate (Senokot-S) tablet 2 tablet  2 tablet Oral QHS Shon Millet, DO   2 tablet at 10/10/16 2219  . sodium chloride (OCEAN) 0.65 % nasal spray 2 spray  2 spray Each Nare TID PRN Shon Millet, DO       . tamsulosin (FLOMAX) capsule 0.4 mg  0.4 mg Oral Daily Rich Fuchs Choi, DO   0.4 mg at 10/11/16 1021  . traMADol (ULTRAM) tablet 100 mg  100 mg Oral Daily Shon Millet, DO      . traZODone (DESYREL) tablet 300 mg  300 mg Oral QHS Shon Millet, DO   300 mg at 10/10/16 2219     Discharge Medications: Please see discharge summary for a list of discharge medications.  Relevant Imaging Results:  Relevant Lab Results:   Additional Information SSN:[id number]  Wende Neighbors, LCSW

## 2016-10-11 NOTE — Discharge Summary (Signed)
Physician Discharge Summary  [name] [name] [id number] DOB: [birth year] DOA: 10/10/2016  PCP: Lesia Hausen, PA  Admit date: 10/10/2016 Discharge date: 10/11/2016  Admitted From:  Assisted living ( [facility]) Disposition:  Assisted living  Recommendations for Outpatient Follow-up:  1. Follow up with PCP in 1-2 weeks 2. complestes 5 day course of abx on 10/15/2016  Home Health:as per PT at assist living Equipment/Devices: home o2 ( 3L)  Discharge Condition:fair CODE STATUS: full code Diet recommendation: Heart Healthy   Discharge Diagnoses:  Principal Problem:   Atypical chest pain, musculoskeletal  Active Problems:   Acute Bronchitis   Chronic respiratory failure with hypoxia and hypercapnia (HCC)   COPD (chronic obstructive pulmonary disease) (HCC)   Chronic diastolic CHF (congestive heart failure) (HCC)   CKD (chronic kidney disease) stage 3, GFR 30-59 ml/min   HTN (hypertension)   Hypothyroid  Brief narrative/ HPI 75 y.o. male with medical history significant of COPD, CAD, HTN, BPH, hypothyroidism who presents with left-sided chest pain that started on the  morning of admission with radiation down to the left arm. He was at rest in bed when this started. He rated it about 8 out of 10 in severity. He describes it as a sharp pain that is reproducible with palpation, worsens with deep breath and cough. He states that he has been coughing and having brown productive cough. He does have COPD and is oxygen dependent at baseline. He admits to shortness of breath with exertion. No nausea or vomiting, no abdominal pain. He does have chronic hip joint pain due to arthritis.His chest pain improved with morphine and Norco in the emergency department. Pt denies any  chest trauma or lifting heavy weights.  ED Course: Patient was given aspirin, morphine, Norco. D-dimer was obtained which was elevated. CT was negative for PE. [facility] hospitalist is asked to to see patient for  observation due to chest pain.  Hospital course Atypical chest pain -Chest pain is sharp, left sided, reproducible with palpation, deep breath, and cough. Improved with morphine. Heart score 3. EKG unremarkable. CTA negative for PE.  -Continue Plavix -troponin negative. Stable on tele and repeat EKG unremarkable. Has pain reproducible  on palpation this morning, but much improved from admission..  Acute bronchitis  -Cough with brown sputum -placed on empiric doxycycline for 5 day course. Cough resolved on evaluation today. doesnot need prednisone.  COPD, without exacerbation / Chronic hypoxemic respiratory failure -Uses 3L B and E on and off at baseline  -Sees Dr. Melvyn Novas on prn follow up basis  -Continue home dulera and nebs .     Chronic diastolic heart failure -Echo in 02/2016 with EF 0000000, grade 1 diastolic dysfunction -Stable   CKD stage 3 -Baseline Cr 1.2 -Stable   Chronic normocytic anemia -Baseline Hgb 12 -Stable   Essential hypertension -Continue Lopressor  Hypothyroidism -Continue Synthroid  GERD -Continue PPI   Insomnia -Continue trazodone  BPH -Continue flomax   Anxiety -Continue home Ativan q12h prn    Consults: none   Procedure: CT angio chest   Family communication: none at bedside   Dispo: home     Discharge Instructions   Allergies as of 10/11/2016   No Known Allergies     Medication List    TAKE these medications   albuterol (2.5 MG/3ML) 0.083% nebulizer solution Commonly known as:  PROVENTIL Take 2.5 mg by nebulization every 6 (six) hours as needed for wheezing or shortness of breath. Reported on 03/13/2016   ALPRAZolam 0.25 MG tablet Commonly known  as:  XANAX Take 0.25 mg by mouth every 12 (twelve) hours as needed for anxiety.   calcitRIOL 0.5 MCG capsule Commonly known as:  ROCALTROL Take 0.5 mcg by mouth daily.   CALCIUM PLUS VITAMIN D PO Take 1 tablet by mouth 2 (two) times daily.    carboxymethylcellulose 1 % ophthalmic solution Place 1 drop into both eyes 2 (two) times daily.   cetirizine 10 MG tablet Commonly known as:  ZYRTEC Take 10 mg by mouth daily.   clopidogrel 75 MG tablet Commonly known as:  PLAVIX Take 75 mg by mouth every morning.   cyanocobalamin 500 MCG tablet Take 500 mcg by mouth daily.   cycloSPORINE 0.05 % ophthalmic emulsion Commonly known as:  RESTASIS Place 1 drop into both eyes 2 (two) times daily.   DIABETIC TUSSIN MAX ST PO Take 5 mLs by mouth 2 (two) times daily as needed (cough).   diclofenac sodium 1 % Gel Commonly known as:  VOLTAREN Apply 2 g topically 2 (two) times daily as needed (pain).   doxycycline 100 MG tablet Commonly known as:  VIBRA-TABS Take 1 tablet (100 mg total) by mouth every 12 (twelve) hours.   DULERA 100-5 MCG/ACT Aero Generic drug:  mometasone-formoterol Inhale 2 puffs into the lungs 2 (two) times daily.   fluticasone 50 MCG/ACT nasal spray Commonly known as:  FLONASE Place 2 sprays into the nose daily as needed for allergies.   HYDROcodone-acetaminophen 5-325 MG tablet Commonly known as:  NORCO/VICODIN Take 1 tablet by mouth every 6 (six) hours as needed for moderate pain.   ipratropium 0.02 % nebulizer solution Commonly known as:  ATROVENT Take 0.5 mg by nebulization every 6 (six) hours as needed for wheezing or shortness of breath.   levothyroxine 25 MCG tablet Commonly known as:  SYNTHROID, LEVOTHROID Take 25 mcg by mouth daily before breakfast.   levothyroxine 200 MCG tablet Commonly known as:  SYNTHROID, LEVOTHROID Take 200 mcg by mouth daily before breakfast.   metoprolol tartrate 25 MG tablet Commonly known as:  LOPRESSOR Take 12.5 mg by mouth 2 (two) times daily. Hold for systolic blood pressure [id number]   nystatin cream Commonly known as:  MYCOSTATIN Apply 1 application topically 2 (two) times daily as needed for dry skin.   OXYGEN Inhale into the lungs at bedtime.    oxymetazoline 0.05 % nasal spray Commonly known as:  AFRIN Place 1 spray into both nostrils 2 (two) times daily as needed for congestion.   pantoprazole 20 MG tablet Commonly known as:  PROTONIX Take 1 tablet (20 mg total) by mouth daily.   polyethylene glycol powder powder Commonly known as:  GLYCOLAX/MIRALAX Take 1 Container by mouth 2 (two) times daily as needed for mild constipation. Mix 1 capful (17g) in 8 ounces of fluid and drink   sennosides-docusate sodium 8.6-50 MG tablet Commonly known as:  SENOKOT-S Take 2 tablets by mouth at bedtime.   Sodium Chloride (Inhalant) 7 % Nebu Inhale into the lungs 2 (two) times daily.   sodium chloride 0.65 % Soln nasal spray Commonly known as:  OCEAN Place 2 sprays into both nostrils 3 (three) times daily as needed for congestion.   tamsulosin 0.4 MG Caps capsule Commonly known as:  FLOMAX Take 0.4 mg by mouth daily. Hold if SBP < 100   traMADol 50 MG tablet Commonly known as:  ULTRAM Take 100 mg by mouth daily.   traZODone 150 MG tablet Commonly known as:  DESYREL Take 300 mg by mouth at bedtime.  TUMS 500 MG chewable tablet Generic drug:  calcium carbonate Chew 2 tablets by mouth daily as needed for indigestion or heartburn.      Follow-up Information    Lesia Hausen, [place]. Schedule an appointment as soon as possible for a visit in 2 week(s).   Specialty:  Internal Medicine Contact information: [address] [place] [address] [phone]          No Known Allergies    Procedures/Studies: Dg Chest 2 View  Result Date: 10/07/2016 CLINICAL DATA:  Shortness of breath EXAM: CHEST  2 VIEW COMPARISON:  02/26/2016 FINDINGS: Heart size is normal. There is no pleural effusion or edema. No pleural effusion or airspace consolidation. The visualized osseous structures are unremarkable. IMPRESSION: 1. No acute cardiopulmonary abnormalities. Electronically Signed   By: Kerby Moors M.D.   On: 10/07/2016  13:33   Ct Angio Chest Pe W Or Wo Contrast  Result Date: 10/10/2016 CLINICAL DATA:  Chest pain and shortness of breath. Elevated D-dimer. History of COPD and renal cell carcinoma. EXAM: CT ANGIOGRAPHY CHEST WITH CONTRAST TECHNIQUE: Multidetector CT imaging of the chest was performed using the standard protocol during bolus administration of intravenous contrast. Multiplanar CT image reconstructions and MIPs were obtained to evaluate the vascular anatomy. CONTRAST:  75 mL Isovue 370 COMPARISON:  02/23/2016 FINDINGS: Cardiovascular: Pulmonary arterial opacification is adequate, however motion artifact limits segmental and subsegmental assessment in the lower lobes, particularly on the right. Abnormal appearance of a right upper lobe apical subsegmental branch is unchanged and may reflect the sequelae of a prior embolus. No definite acute segmental or more proximal emboli are identified. There is thoracic aortic atherosclerosis with unchanged ascending aortic ectasia measuring 4.0 cm in diameter. Three-vessel coronary artery atherosclerosis is noted. No pericardial effusion. Mediastinum/Nodes: No enlarged axillary, mediastinal, or hilar lymph nodes are identified. The thyroid, trachea, and esophagus are unremarkable. Lungs/Pleura: No pleural effusion or pneumothorax. Evaluation of the lung parenchyma is limited in the bases by motion artifact. Additionally, the posterior right lung base was incompletely imaged. There is elevation of the left hemidiaphragm with overlying lingular and lower lobe atelectasis. Basilar right lower lobe atelectasis is present as well. No lung mass. Upper Abdomen: No acute abnormality. Musculoskeletal: Mild bilateral gynecomastia. Partially visualized right shoulder arthroplasty. Cervicothoracic spondylosis. Review of the MIP images confirms the above findings. IMPRESSION: 1. No acute pulmonary emboli identified, however lower lobe evaluation is limited by motion artifact. 2. Bibasilar  atelectasis. 3. Aortic atherosclerosis with unchanged ascending aortic ectasia. Electronically Signed   By: Logan Bores M.D.   On: 10/10/2016 15:56   Dg Chest Port 1 View  Result Date: 10/10/2016 CLINICAL DATA:  75 year old male with central and left-sided chest pain radiating into the left arm since this morning. EXAM: PORTABLE CHEST 1 VIEW COMPARISON:  Chest x-ray 10/07/2016. FINDINGS: Lung volumes are very low. There are some bibasilar opacities favored to reflect areas of mild subsegmental atelectasis. No definite consolidative airspace disease or pleural effusions. No evidence of pulmonary edema. Borderline cardiomegaly, likely accentuated by the patient's low lung volumes and portable AP technique. Upper mediastinal contours are within normal limits. Status post right shoulder arthroplasty. IMPRESSION: 1. Low lung volumes with some bibasilar subsegmental atelectasis, but no radiographic evidence of acute cardiopulmonary disease. Electronically Signed   By: Vinnie Langton M.D.   On: 10/10/2016 12:34       Subjective:  Complaint of chest discomfort this morning, reproducible on palpation. EKG done unremarkable. Subsided shortly.  Discharge Exam:  Vitals:   10/11/16 0411 10/11/16 0900  BP: (!) 94/45 135/81  Pulse: 69 69  Resp: 20 17  Temp:  98.2 F (36.8 C)   Vitals:   10/10/16 1645 10/10/16 1755 10/11/16 0411 10/11/16 0900  BP: 127/62 (!) 103/53 (!) 94/45 135/81  Pulse: 67 66 69 69  Resp: 20 20 20 17   Temp:  99 F (37.2 C)  98.2 F (36.8 C)  TempSrc:  Oral Oral Oral  SpO2: 99% 100% 94% 97%  Weight:  115.8 kg (255 lb 3.2 oz)    Height:  6\' 3"  (1.905 m)      General: Not in distress HEENT : Moist mucosa, supple neck Cardiovascular: RRR, S1/S2 +, no rubs, no gallops Chest: CTA bilaterally, no wheezing, no rhonchi GI: Soft, NT, ND,  Musculoskeletal: Warm, no edema    The results of significant diagnostics from this hospitalization (including imaging, microbiology,  ancillary and laboratory) are listed below for reference.     Microbiology: No results found for this or any previous visit (from the past 240 hour(s)).   Labs: BNP (last 3 results)  Recent Labs  02/15/16 1928 10/10/16 1209  BNP 35.3 [id number]   Basic Metabolic Panel:  Recent Labs Lab 10/07/16 1105 10/10/16 1209 10/10/16 1845  NA 142 142  --   K 4.5 3.9  --   CL 102 106  --   CO2 32 26  --   GLUCOSE 71 89  --   BUN 22 14  --   CREATININE 1.24 1.29* 1.28*  CALCIUM 8.6 8.4*  --    Liver Function Tests:  Recent Labs Lab 10/10/16 1209  AST 23  ALT 9*  ALKPHOS 57  BILITOT 0.5  PROT 5.9*  ALBUMIN 3.2*   No results for input(s): LIPASE, AMYLASE in the last 168 hours. No results for input(s): AMMONIA in the last 168 hours. CBC:  Recent Labs Lab 10/07/16 1105 10/10/16 1209 10/10/16 1845  WBC 7.8 7.2 7.8  NEUTROABS 6.1 5.2  --   HGB 12.6* 11.9* 11.0*  HCT 37.0* 36.0* 33.7*  MCV 97.0 99.2 98.8  PLT 188.0 173 155   Cardiac Enzymes:  Recent Labs Lab 10/10/16 1845 10/10/16 2047 10/10/16 2356  TROPONINI <0.03 <0.03 <0.03   BNP: Invalid input(s): POCBNP CBG: No results for input(s): GLUCAP in the last 168 hours. D-Dimer  Recent Labs  10/10/16 1209  DDIMER 0.88*   Hgb A1c No results for input(s): HGBA1C in the last 72 hours. Lipid Profile No results for input(s): CHOL, HDL, LDLCALC, TRIG, CHOLHDL, LDLDIRECT in the last 72 hours. Thyroid function studies No results for input(s): TSH, T4TOTAL, T3FREE, THYROIDAB in the last 72 hours.  Invalid input(s): FREET3 Anemia work up No results for input(s): VITAMINB12, FOLATE, FERRITIN, TIBC, IRON, RETICCTPCT in the last 72 hours. Urinalysis    Component Value Date/Time   COLORURINE YELLOW 06/24/2016 1406   APPEARANCEUR CLOUDY (A) 06/24/2016 1406   LABSPEC 1.023 06/24/2016 1406   PHURINE 8.5 (H) 06/24/2016 1406   GLUCOSEU NEGATIVE 06/24/2016 1406   HGBUR SMALL (A) 06/24/2016 1406   BILIRUBINUR NEGATIVE  06/24/2016 1406   KETONESUR NEGATIVE 06/24/2016 1406   PROTEINUR NEGATIVE 06/24/2016 1406   UROBILINOGEN 0.2 07/10/2015 1630   NITRITE NEGATIVE 06/24/2016 1406   LEUKOCYTESUR TRACE (A) 06/24/2016 1406   Sepsis Labs Invalid input(s): PROCALCITONIN,  WBC,  LACTICIDVEN Microbiology No results found for this or any previous visit (from the past 240 hour(s)).   Time coordinating discharge: < 30 minutes  SIGNED:   Louellen Molder, MD  [facility] 10/11/2016, 9:29 AM Pager   If 7PM-7AM, please contact night-coverage [email or web address] Password [email or web address]

## 2016-10-11 NOTE — Progress Notes (Signed)
Patient complained of left sided chest pain of 5/10. He stated it was worse when he coughed. Pain medicine was given, vitals were taken, and a EKG was done. The physician has been made aware. Will continue to monitor.

## 2016-10-24 ENCOUNTER — Encounter (HOSPITAL_COMMUNITY): Payer: Self-pay | Admitting: Emergency Medicine

## 2016-10-24 ENCOUNTER — Emergency Department (HOSPITAL_COMMUNITY)
Admission: EM | Admit: 2016-10-24 | Discharge: 2016-10-24 | Disposition: A | Discharge status: Home / Self Care | Payer: Medicare Other | Attending: Emergency Medicine | Admitting: Emergency Medicine

## 2016-10-24 DIAGNOSIS — I2510 Atherosclerotic heart disease of native coronary artery without angina pectoris: Secondary | ICD-10-CM | POA: Diagnosis not present

## 2016-10-24 DIAGNOSIS — I252 Old myocardial infarction: Secondary | ICD-10-CM | POA: Insufficient documentation

## 2016-10-24 DIAGNOSIS — E039 Hypothyroidism, unspecified: Secondary | ICD-10-CM | POA: Insufficient documentation

## 2016-10-24 DIAGNOSIS — N183 Chronic kidney disease, stage 3 (moderate): Secondary | ICD-10-CM | POA: Insufficient documentation

## 2016-10-24 DIAGNOSIS — M436 Torticollis: Secondary | ICD-10-CM | POA: Diagnosis not present

## 2016-10-24 DIAGNOSIS — Z85528 Personal history of other malignant neoplasm of kidney: Secondary | ICD-10-CM | POA: Diagnosis not present

## 2016-10-24 DIAGNOSIS — I130 Hypertensive heart and chronic kidney disease with heart failure and stage 1 through stage 4 chronic kidney disease, or unspecified chronic kidney disease: Secondary | ICD-10-CM | POA: Diagnosis not present

## 2016-10-24 DIAGNOSIS — F0280 Dementia in other diseases classified elsewhere without behavioral disturbance: Secondary | ICD-10-CM | POA: Insufficient documentation

## 2016-10-24 DIAGNOSIS — M542 Cervicalgia: Secondary | ICD-10-CM | POA: Diagnosis present

## 2016-10-24 DIAGNOSIS — G308 Other Alzheimer's disease: Secondary | ICD-10-CM | POA: Insufficient documentation

## 2016-10-24 DIAGNOSIS — Z79899 Other long term (current) drug therapy: Secondary | ICD-10-CM | POA: Insufficient documentation

## 2016-10-24 DIAGNOSIS — J449 Chronic obstructive pulmonary disease, unspecified: Secondary | ICD-10-CM | POA: Diagnosis not present

## 2016-10-24 DIAGNOSIS — Z96653 Presence of artificial knee joint, bilateral: Secondary | ICD-10-CM | POA: Diagnosis not present

## 2016-10-24 DIAGNOSIS — I5032 Chronic diastolic (congestive) heart failure: Secondary | ICD-10-CM | POA: Insufficient documentation

## 2016-10-24 MED ORDER — METHOCARBAMOL 500 MG PO TABS: 1000.0000 mg | ORAL_TABLET | Freq: Three times a day (TID) | ORAL | 0 refills | Status: DC | PRN

## 2016-10-24 MED ORDER — HYDROCODONE-ACETAMINOPHEN 5-325 MG PO TABS
1.0000 | ORAL_TABLET | Freq: Once | ORAL | Status: AC
  Administered 2016-10-24: 1 via ORAL
  Filled 2016-10-24: qty 1

## 2016-10-24 NOTE — ED Provider Notes (Signed)
[name] DEPT Provider Note   CSN: [id number] Arrival date & time: 10/24/16  0448     History   Chief Complaint Chief Complaint  Patient presents with  . Neck Pain    HPI [name] [name] is a 76 y.o. male. He presents for evaluation of back pain.  HPI:  Patient has had neck pain for last 2 weeks. He describes a past summer episode a few years ago that resolved with a muscle relaxant. Right posterior neck pain. Hurts to flex or twist his neck. No oral or injury. No fever. No radiation to the extremities. Localized pain.  Past Medical History:  Diagnosis Date  . Allergic rhinitis, cause unspecified   . Arthritis    osteoarthritis  . Cancer ([place])    renal carcinoma  . COPD (chronic obstructive pulmonary disease) (HCC)    [name] FOLLOWS PT--PT USING OXYGEN 24 HRS A DAY  . COPD (chronic obstructive pulmonary disease) (HCC)    PULMONARY CLEARANCE FOR LEFT RENAL CRYOABLATION ON CHART FROM DR. CLANCE -07/26/11  . Coronary artery disease    CARDIAC CLEARANCE  OFFICE NOTE FOR LEFT CRYO ABLATION ON CHART FROM [name] -"RECENT STRESS TEST DID NOT SHOW ISCHEMIA" -"PRESUMED CAD DUE TO CORONARY CALCIFICATIONS"   . DEMENTIA    Alzheimer's, senile dementia  . Family history of ischemic heart disease   . Family history of malignant neoplasm of gastrointestinal tract   . GERD (gastroesophageal reflux disease)   . Hypothyroidism   . Myocardial infarction   . Obesity hypoventilation syndrome ([place])    failed cpap/bipap trial  . Other and unspecified hyperlipidemia   . Other congenital hamartoses, not elsewhere classified   . Pneumonia    HX     . Shortness of breath   . Sleep apnea    OXYGEN 24 HR/DAY-NO CPAP  . Unspecified essential hypertension     Patient Active Problem List   Diagnosis Date Noted  . Musculoskeletal chest pain   . Bronchitis 10/10/2016  . CKD (chronic kidney disease) stage 3, GFR 30-59 ml/min 10/10/2016  . HTN (hypertension) 10/10/2016  .  Hypothyroid 10/10/2016  . Chronic diastolic CHF (congestive heart failure) ([place]) 03/03/2016  . Coronary artery disease 03/03/2016  . Thoracic aortic aneurysm ([place]) 03/03/2016  . Dementia 02/23/2016  . COPD (chronic obstructive pulmonary disease) ([place]) 02/23/2016  . Oliguria 02/23/2016  . Atypical chest pain 02/23/2016  . Chronic respiratory failure with hypoxia and hypercapnia ([place]) 09/27/2015  . Morbid obesity ([place]) 07/13/2015  . Dyspnea on exertion 07/12/2015  . OSA (obstructive sleep apnea) 11/15/2011  . Renal mass, left 09/14/2011  . DISORDERS OF DIAPHRAGM 07/02/2010  . OBESITY HYPOVENTILATION SYNDROME 02/14/2009  . Multiple pulmonary nodules 02/14/2009  . CHOLELITHIASIS 05/04/2008  . Nonspecific (abnormal) findings on radiological and other examination of body structure 04/28/2008  . ABNORMAL CHEST XRAY 04/28/2008  . 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 HYPOPARATHYROIDISM 03/31/2008  . HYPOCALCEMIA 03/31/2008  . 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 DYSLIPIDEMIA 09/21/2007  . Hypertensive heart disease with CHF (congestive heart failure) ([place]) 09/21/2007  . Allergic rhinitis 09/21/2007    Past Surgical History:  Procedure Laterality Date  . CARPAL TUNNEL RELEASE     right  . CHOLECYSTECTOMY  2009  . EYE SURGERY     bilateral cataracts  . JOINT REPLACEMENT     bilateral knee replacements  . KNEE ARTHROPLASTY     2005 LEFT   AND 2007 RIGHT--[name]  . THYROIDECTOMY    .  TUMOR REMOVAL  09/14/2011       Home Medications    Prior to Admission medications   Medication Sig Start Date End Date Taking? Authorizing Provider  albuterol (PROVENTIL) (2.5 MG/3ML) 0.083% nebulizer solution Take 2.5 mg by nebulization every 6 (six) hours as needed for wheezing or shortness of breath. Reported on 03/13/2016    Historical Provider, MD  ALPRAZolam Duanne Moron) 0.25 MG tablet Take 0.25 mg by mouth every 12 (twelve) hours as needed for anxiety.    Historical Provider, MD    calcitRIOL (ROCALTROL) 0.5 MCG capsule Take 0.5 mcg by mouth daily.    Historical Provider, MD  calcium carbonate (TUMS) 500 MG chewable tablet Chew 2 tablets by mouth daily as needed for indigestion or heartburn.    Historical Provider, MD  Calcium Carbonate-Vitamin D (CALCIUM PLUS VITAMIN D PO) Take 1 tablet by mouth 2 (two) times daily.    Historical Provider, MD  carboxymethylcellulose 1 % ophthalmic solution Place 1 drop into both eyes 2 (two) times daily.    Historical Provider, MD  cetirizine (ZYRTEC) 10 MG tablet Take 10 mg by mouth daily.    Historical Provider, MD  clopidogrel (PLAVIX) 75 MG tablet Take 75 mg by mouth every morning.     Historical Provider, MD  cyanocobalamin 500 MCG tablet Take 500 mcg by mouth daily.    Historical Provider, MD  cycloSPORINE (RESTASIS) 0.05 % ophthalmic emulsion Place 1 drop into both eyes 2 (two) times daily.    Historical Provider, MD  Dextromethorphan-Guaifenesin (DIABETIC TUSSIN MAX ST PO) Take 5 mLs by mouth 2 (two) times daily as needed (cough).    Historical Provider, MD  diclofenac sodium (VOLTAREN) 1 % GEL Apply 2 g topically 2 (two) times daily as needed (pain).     Historical Provider, MD  fluticasone (FLONASE) 50 MCG/ACT nasal spray Place 2 sprays into the nose daily as needed for allergies.     Historical Provider, MD  HYDROcodone-acetaminophen (NORCO/VICODIN) 5-325 MG tablet Take 1 tablet by mouth every 6 (six) hours as needed for moderate pain. 02/27/16   Domenic Polite, MD  ipratropium (ATROVENT) 0.02 % nebulizer solution Take 0.5 mg by nebulization every 6 (six) hours as needed for wheezing or shortness of breath.    Historical Provider, MD  levothyroxine (SYNTHROID, LEVOTHROID) 200 MCG tablet Take 200 mcg by mouth daily before breakfast.    Historical Provider, MD  levothyroxine (SYNTHROID, LEVOTHROID) 25 MCG tablet Take 25 mcg by mouth daily before breakfast.    Historical Provider, MD  metoprolol tartrate (LOPRESSOR) 25 MG tablet Take  12.5 mg by mouth 2 (two) times daily. Hold for systolic blood pressure [id number]    Historical Provider, MD  mometasone-formoterol (DULERA) 100-5 MCG/ACT AERO Inhale 2 puffs into the lungs 2 (two) times daily.    Historical Provider, MD  nystatin cream (MYCOSTATIN) Apply 1 application topically 2 (two) times daily as needed for dry skin.    Historical Provider, MD  OXYGEN Inhale into the lungs at bedtime.    Historical Provider, MD  oxymetazoline (AFRIN) 0.05 % nasal spray Place 1 spray into both nostrils 2 (two) times daily as needed for congestion.    Historical Provider, MD  pantoprazole (PROTONIX) 20 MG tablet Take 1 tablet (20 mg total) by mouth daily. 06/24/16   Lacretia Leigh, MD  polyethylene glycol powder (GLYCOLAX/MIRALAX) powder Take 1 Container by mouth 2 (two) times daily as needed for mild constipation. Mix 1 capful (17g) in 8 ounces of fluid and drink  Historical Provider, MD  sennosides-docusate sodium (SENOKOT-S) 8.6-50 MG tablet Take 2 tablets by mouth at bedtime.    Historical Provider, MD  sodium chloride (OCEAN) 0.65 % SOLN nasal spray Place 2 sprays into both nostrils 3 (three) times daily as needed for congestion.    Historical Provider, MD  Sodium Chloride, Inhalant, 7 % NEBU Inhale into the lungs 2 (two) times daily.    Historical Provider, MD  Tamsulosin HCl (FLOMAX) 0.4 MG CAPS Take 0.4 mg by mouth daily. Hold if SBP < 100    Historical Provider, MD  traMADol (ULTRAM) 50 MG tablet Take 100 mg by mouth daily.     Historical Provider, MD  traZODone (DESYREL) 150 MG tablet Take 300 mg by mouth at bedtime.    Historical Provider, MD    Family History Family History  Problem Relation Age of Onset  . Cancer Other     colon  . Heart disease Other     CAD, MI and Heart Attack    Social History Social History  Substance Use Topics  . Smoking status: Never Smoker  . Smokeless tobacco: Never Used  . Alcohol use No     Allergies   Patient has no known  allergies.   Review of Systems Review of Systems  Constitutional: Negative for appetite change, chills, diaphoresis, fatigue and fever.  HENT: Negative for mouth sores, sore throat and trouble swallowing.   Eyes: Negative for visual disturbance.  Respiratory: Negative for cough, chest tightness, shortness of breath and wheezing.   Cardiovascular: Negative for chest pain.  Gastrointestinal: Negative for abdominal distention, abdominal pain, diarrhea, nausea and vomiting.  Endocrine: Negative for polydipsia, polyphagia and polyuria.  Genitourinary: Negative for dysuria, frequency and hematuria.  Musculoskeletal: Positive for neck pain. Negative for gait problem.  Skin: Negative for color change, pallor and rash.  Neurological: Negative for dizziness, syncope, light-headedness and headaches.  Hematological: Does not bruise/bleed easily.  Psychiatric/Behavioral: Negative for behavioral problems and confusion.     Physical Exam Updated Vital Signs BP 137/65 (BP Location: Right Arm)   Pulse 76   Temp 99.2 F (37.3 C) (Oral)   Resp 17   SpO2 93%   Physical Exam  Constitutional: He is oriented to person, place, and time. He appears well-developed and well-nourished. No distress.   Head flexed posture.  HENT:  Head: Normocephalic.  Eyes: Conjunctivae are normal. Pupils are equal, round, and reactive to light. No scleral icterus.  Neck: Normal range of motion. No thyromegaly present.    Cardiovascular: Normal rate and regular rhythm.  Exam reveals no gallop and no friction rub.   No murmur heard. Pulmonary/Chest: Effort normal and breath sounds normal. No respiratory distress. He has no wheezes. He has no rales.  Abdominal: Soft. Bowel sounds are normal. He exhibits no distension. There is no tenderness. There is no rebound.  Musculoskeletal: Normal range of motion.  Neurological: He is alert and oriented to person, place, and time.  Skin: Skin is warm and dry. No rash noted.   Psychiatric: He has a normal mood and affect. His behavior is normal.     ED Treatments / Results  Labs (all labs ordered are listed, but only abnormal results are displayed) Labs Reviewed - No data to display  EKG  EKG Interpretation None       Radiology No results found.  Procedures Procedures (including critical care time)  Medications Ordered in ED Medications  HYDROcodone-acetaminophen (NORCO/VICODIN) 5-325 MG per tablet 1 tablet (1 tablet Oral  Given 10/24/16 0529)     Initial Impression / Assessment and Plan / ED Course  I have reviewed the triage vital signs and the nursing notes.  Pertinent labs & imaging results that were available during my care of the patient were reviewed by me and considered in my medical decision making (see chart for details).  Clinical Course    Given by mouth Vicodin, and Robaxin. Will observe and reexamine for improvement. Ultimately primary care follow-up. Robaxin prescription.   Final Clinical Impressions(s) / ED Diagnoses   Final diagnoses:  Torticollis, acute    New Prescriptions New Prescriptions   No medications on file     Tanna Furry, MD 10/24/16 [phone]

## 2016-10-24 NOTE — ED Triage Notes (Addendum)
Brought in by [facility] from [place] with c/o right neck pain, onset 2 weeks ago.  Pt reports pain has worsened this morning on awakening.

## 2016-10-24 NOTE — ED Notes (Signed)
Report given to [facility] 

## 2016-10-24 NOTE — ED Notes (Signed)
Bed: [id number] Expected date:  Expected time:  Means of arrival:  Comments: Neck pain

## 2016-10-24 NOTE — ED Notes (Signed)
[facility] was called and was notified of pt's need of transportation back to [facility].

## 2016-11-05 ENCOUNTER — Emergency Department (HOSPITAL_COMMUNITY): Payer: Medicare Other

## 2016-11-05 ENCOUNTER — Emergency Department (HOSPITAL_COMMUNITY)
Admission: EM | Admit: 2016-11-05 | Discharge: 2016-11-05 | Disposition: A | Discharge status: Home / Self Care | Payer: Medicare Other | Attending: Emergency Medicine | Admitting: Emergency Medicine

## 2016-11-05 ENCOUNTER — Encounter (HOSPITAL_COMMUNITY): Payer: Self-pay

## 2016-11-05 DIAGNOSIS — I252 Old myocardial infarction: Secondary | ICD-10-CM | POA: Diagnosis not present

## 2016-11-05 DIAGNOSIS — Z85520 Personal history of malignant carcinoid tumor of kidney: Secondary | ICD-10-CM | POA: Insufficient documentation

## 2016-11-05 DIAGNOSIS — Z79899 Other long term (current) drug therapy: Secondary | ICD-10-CM | POA: Diagnosis not present

## 2016-11-05 DIAGNOSIS — Z96653 Presence of artificial knee joint, bilateral: Secondary | ICD-10-CM | POA: Diagnosis not present

## 2016-11-05 DIAGNOSIS — I5032 Chronic diastolic (congestive) heart failure: Secondary | ICD-10-CM | POA: Insufficient documentation

## 2016-11-05 DIAGNOSIS — I2510 Atherosclerotic heart disease of native coronary artery without angina pectoris: Secondary | ICD-10-CM | POA: Insufficient documentation

## 2016-11-05 DIAGNOSIS — I130 Hypertensive heart and chronic kidney disease with heart failure and stage 1 through stage 4 chronic kidney disease, or unspecified chronic kidney disease: Secondary | ICD-10-CM | POA: Diagnosis not present

## 2016-11-05 DIAGNOSIS — J189 Pneumonia, unspecified organism: Secondary | ICD-10-CM | POA: Diagnosis not present

## 2016-11-05 DIAGNOSIS — K5900 Constipation, unspecified: Secondary | ICD-10-CM | POA: Insufficient documentation

## 2016-11-05 DIAGNOSIS — M542 Cervicalgia: Secondary | ICD-10-CM | POA: Diagnosis not present

## 2016-11-05 DIAGNOSIS — N183 Chronic kidney disease, stage 3 (moderate): Secondary | ICD-10-CM | POA: Insufficient documentation

## 2016-11-05 DIAGNOSIS — E039 Hypothyroidism, unspecified: Secondary | ICD-10-CM | POA: Diagnosis not present

## 2016-11-05 DIAGNOSIS — M436 Torticollis: Secondary | ICD-10-CM | POA: Diagnosis present

## 2016-11-05 DIAGNOSIS — J449 Chronic obstructive pulmonary disease, unspecified: Secondary | ICD-10-CM | POA: Insufficient documentation

## 2016-11-05 LAB — CBC
HCT: 35.3 % — ABNORMAL LOW (ref 39.0–52.0)
Hemoglobin: 11.8 g/dL — ABNORMAL LOW (ref 13.0–17.0)
MCH: 32.6 pg (ref 26.0–34.0)
MCHC: 33.4 g/dL (ref 30.0–36.0)
MCV: 97.5 fL (ref 78.0–100.0)
PLATELETS: 211 10*3/uL (ref 150–400)
RBC: 3.62 MIL/uL — ABNORMAL LOW (ref 4.22–5.81)
RDW: 13 % (ref 11.5–15.5)
WBC: 6.6 10*3/uL (ref 4.0–10.5)

## 2016-11-05 LAB — BASIC METABOLIC PANEL
Anion gap: 11 (ref 5–15)
BUN: 24 mg/dL — ABNORMAL HIGH (ref 6–20)
CALCIUM: 8.8 mg/dL — AB (ref 8.9–10.3)
CO2: 25 mmol/L (ref 22–32)
CREATININE: 1.1 mg/dL (ref 0.61–1.24)
Chloride: 102 mmol/L (ref 101–111)
Glucose, Bld: 99 mg/dL (ref 65–99)
Potassium: 4.2 mmol/L (ref 3.5–5.1)
SODIUM: 138 mmol/L (ref 135–145)

## 2016-11-05 LAB — TROPONIN I

## 2016-11-05 MED ORDER — MORPHINE SULFATE (PF) 4 MG/ML IV SOLN
6.0000 mg | Freq: Once | INTRAVENOUS | Status: AC
Start: 2016-11-05 — End: 2016-11-05
  Administered 2016-11-05: 6 mg via INTRAVENOUS
  Filled 2016-11-05: qty 2

## 2016-11-05 MED ORDER — POLYETHYLENE GLYCOL 3350 17 GM/SCOOP PO POWD: 17.0000 g | Freq: Two times a day (BID) | ORAL | 0 refills | Status: DC

## 2016-11-05 MED ORDER — LEVOFLOXACIN 500 MG PO TABS
500.0000 mg | ORAL_TABLET | Freq: Once | ORAL | Status: AC
  Administered 2016-11-05: 500 mg via ORAL
  Filled 2016-11-05: qty 1

## 2016-11-05 MED ORDER — LEVOFLOXACIN 500 MG PO TABS: 500.0000 mg | ORAL_TABLET | Freq: Every day | ORAL | 0 refills | Status: DC

## 2016-11-05 NOTE — ED Notes (Signed)
Spoke with nurses at facility.  For updates contact Driscilla Moats or [place] at [phone]

## 2016-11-05 NOTE — ED Notes (Signed)
Attempted to call report to facility x2.  Placed on hold with no response.

## 2016-11-05 NOTE — ED Notes (Signed)
Attempted to call report to facility without success x1.

## 2016-11-05 NOTE — ED Triage Notes (Addendum)
Per EMS report: Pt from [facility], c/o chronic neck pain in which he takes narcotics for which give him constipation.  Reports no BM x 5 days.  Wears O2 AAT.  EMS VS:  124/60, 76, 100% 4L/[place].

## 2016-11-05 NOTE — ED Notes (Signed)
Contacted for [facility] for transport

## 2016-11-05 NOTE — ED Provider Notes (Signed)
[facility] DEPT Provider Note   CSN: [id number] Arrival date & time: 11/05/16  1045     History   Chief Complaint Chief Complaint  Patient presents with  . Torticollis  . Constipation    HPI [name] [name] is a 76 y.o. male.  HPI Right neck pain x 3 weeks. Pt has been taking pain medication and muscle relaxants now has not had a BM in 7 days ago.Patient ports right neck as well as right shoulder and right lateral chest tenderness over the past several weeks that is worse with movement and palpation of his neck, shoulder, right lateral chest.  No injury or trauma that the patient can remember.  He's been trying the muscle relaxants and pain medication without significant improvement in his pain.  Denies blood in the stool.  Reports no significant bowel movement of the past 7 days despite MiraLAX.  Denies abdominal pain or abdominal swelling.  Past Medical History:  Diagnosis Date  . Allergic rhinitis, cause unspecified   . Arthritis    osteoarthritis  . Cancer ([place])    renal carcinoma  . COPD (chronic obstructive pulmonary disease) (HCC)    [name] FOLLOWS PT--PT USING OXYGEN 24 HRS A DAY  . COPD (chronic obstructive pulmonary disease) (HCC)    PULMONARY CLEARANCE FOR LEFT RENAL CRYOABLATION ON CHART FROM DR. CLANCE -07/26/11  . Coronary artery disease    CARDIAC CLEARANCE  OFFICE NOTE FOR LEFT CRYO ABLATION ON CHART FROM [name] -"RECENT STRESS TEST DID NOT SHOW ISCHEMIA" -"PRESUMED CAD DUE TO CORONARY CALCIFICATIONS"   . DEMENTIA    Alzheimer's, senile dementia  . Family history of ischemic heart disease   . Family history of malignant neoplasm of gastrointestinal tract   . GERD (gastroesophageal reflux disease)   . Hypothyroidism   . Myocardial infarction   . Obesity hypoventilation syndrome ([place])    failed cpap/bipap trial  . Other and unspecified hyperlipidemia   . Other congenital hamartoses, not elsewhere classified   . Pneumonia    HX     .  Shortness of breath   . Sleep apnea    OXYGEN 24 HR/DAY-NO CPAP  . Unspecified essential hypertension     Patient Active Problem List   Diagnosis Date Noted  . Musculoskeletal chest pain   . Bronchitis 10/10/2016  . CKD (chronic kidney disease) stage 3, GFR 30-59 ml/min 10/10/2016  . HTN (hypertension) 10/10/2016  . Hypothyroid 10/10/2016  . Chronic diastolic CHF (congestive heart failure) ([place]) 03/03/2016  . Coronary artery disease 03/03/2016  . Thoracic aortic aneurysm ([place]) 03/03/2016  . Dementia 02/23/2016  . COPD (chronic obstructive pulmonary disease) ([place]) 02/23/2016  . Oliguria 02/23/2016  . Atypical chest pain 02/23/2016  . Chronic respiratory failure with hypoxia and hypercapnia ([place]) 09/27/2015  . Morbid obesity ([place]) 07/13/2015  . Dyspnea on exertion 07/12/2015  . OSA (obstructive sleep apnea) 11/15/2011  . Renal mass, left 09/14/2011  . DISORDERS OF DIAPHRAGM 07/02/2010  . OBESITY HYPOVENTILATION SYNDROME 02/14/2009  . Multiple pulmonary nodules 02/14/2009  . CHOLELITHIASIS 05/04/2008  . Nonspecific (abnormal) findings on radiological and other examination of body structure 04/28/2008  . ABNORMAL CHEST XRAY 04/28/2008  . 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 HYPOPARATHYROIDISM 03/31/2008  . HYPOCALCEMIA 03/31/2008  . 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 DYSLIPIDEMIA 09/21/2007  . Hypertensive heart disease with CHF (congestive heart failure) ([place]) 09/21/2007  . Allergic rhinitis 09/21/2007    Past Surgical History:  Procedure Laterality Date  . CARPAL TUNNEL RELEASE  right  . CHOLECYSTECTOMY  2009  . EYE SURGERY     bilateral cataracts  . JOINT REPLACEMENT     bilateral knee replacements  . KNEE ARTHROPLASTY     2005 LEFT   AND 2007 RIGHT--[name]  . THYROIDECTOMY    . TUMOR REMOVAL  09/14/2011       Home Medications    Prior to Admission medications   Medication Sig Start Date End Date Taking? Authorizing Provider    albuterol (PROVENTIL) (2.5 MG/3ML) 0.083% nebulizer solution Take 2.5 mg by nebulization every 6 (six) hours as needed for wheezing or shortness of breath. Reported on 03/13/2016    Historical Provider, MD  ALPRAZolam Duanne Moron) 0.25 MG tablet Take 0.25 mg by mouth every 12 (twelve) hours as needed for anxiety.    Historical Provider, MD  calcitRIOL (ROCALTROL) 0.5 MCG capsule Take 0.5 mcg by mouth daily.    Historical Provider, MD  calcium carbonate (TUMS) 500 MG chewable tablet Chew 2 tablets by mouth daily as needed for indigestion or heartburn.    Historical Provider, MD  Calcium Carbonate-Vitamin D (CALCIUM PLUS VITAMIN D PO) Take 1 tablet by mouth 2 (two) times daily.    Historical Provider, MD  carboxymethylcellulose 1 % ophthalmic solution Place 1 drop into both eyes 2 (two) times daily.    Historical Provider, MD  cetirizine (ZYRTEC) 10 MG tablet Take 10 mg by mouth daily.    Historical Provider, MD  clopidogrel (PLAVIX) 75 MG tablet Take 75 mg by mouth every morning.     Historical Provider, MD  cyanocobalamin 500 MCG tablet Take 500 mcg by mouth daily.    Historical Provider, MD  cycloSPORINE (RESTASIS) 0.05 % ophthalmic emulsion Place 1 drop into both eyes 2 (two) times daily.    Historical Provider, MD  Dextromethorphan-Guaifenesin (DIABETIC TUSSIN MAX ST PO) Take 5 mLs by mouth 2 (two) times daily as needed (cough).    Historical Provider, MD  diclofenac sodium (VOLTAREN) 1 % GEL Apply 2 g topically 2 (two) times daily as needed (pain).     Historical Provider, MD  fluticasone (FLONASE) 50 MCG/ACT nasal spray Place 2 sprays into the nose daily as needed for allergies.     Historical Provider, MD  HYDROcodone-acetaminophen (NORCO/VICODIN) 5-325 MG tablet Take 1 tablet by mouth every 6 (six) hours as needed for moderate pain. 02/27/16   Domenic Polite, MD  ipratropium (ATROVENT) 0.02 % nebulizer solution Take 0.5 mg by nebulization every 6 (six) hours as needed for wheezing or shortness of  breath.    Historical Provider, MD  levothyroxine (SYNTHROID, LEVOTHROID) 200 MCG tablet Take 200 mcg by mouth daily before breakfast.    Historical Provider, MD  levothyroxine (SYNTHROID, LEVOTHROID) 25 MCG tablet Take 25 mcg by mouth daily before breakfast.    Historical Provider, MD  methocarbamol (ROBAXIN) 500 MG tablet Take 2 tablets (1,000 mg total) by mouth 3 (three) times daily between meals as needed. 10/24/16   Tanna Furry, MD  metoprolol tartrate (LOPRESSOR) 25 MG tablet Take 12.5 mg by mouth 2 (two) times daily. Hold for systolic blood pressure [id number]    Historical Provider, MD  mometasone-formoterol (DULERA) 100-5 MCG/ACT AERO Inhale 2 puffs into the lungs 2 (two) times daily.    Historical Provider, MD  nystatin cream (MYCOSTATIN) Apply 1 application topically 2 (two) times daily as needed for dry skin.    Historical Provider, MD  OXYGEN Inhale into the lungs at bedtime.    Historical Provider, MD  oxymetazoline (  AFRIN) 0.05 % nasal spray Place 1 spray into both nostrils 2 (two) times daily as needed for congestion.    Historical Provider, MD  pantoprazole (PROTONIX) 20 MG tablet Take 1 tablet (20 mg total) by mouth daily. 06/24/16   Lacretia Leigh, MD  polyethylene glycol powder (GLYCOLAX/MIRALAX) powder Take 1 Container by mouth 2 (two) times daily as needed for mild constipation. Mix 1 capful (17g) in 8 ounces of fluid and drink     Historical Provider, MD  sennosides-docusate sodium (SENOKOT-S) 8.6-50 MG tablet Take 2 tablets by mouth at bedtime.    Historical Provider, MD  sodium chloride (OCEAN) 0.65 % SOLN nasal spray Place 2 sprays into both nostrils 3 (three) times daily as needed for congestion.    Historical Provider, MD  Sodium Chloride, Inhalant, 7 % NEBU Inhale into the lungs 2 (two) times daily.    Historical Provider, MD  Tamsulosin HCl (FLOMAX) 0.4 MG CAPS Take 0.4 mg by mouth daily. Hold if SBP < 100    Historical Provider, MD  traMADol (ULTRAM) 50 MG tablet Take 100 mg by  mouth daily.     Historical Provider, MD  traZODone (DESYREL) 150 MG tablet Take 300 mg by mouth at bedtime.    Historical Provider, MD    Family History Family History  Problem Relation Age of Onset  . Cancer Other     colon  . Heart disease Other     CAD, MI and Heart Attack    Social History Social History  Substance Use Topics  . Smoking status: Never Smoker  . Smokeless tobacco: Never Used  . Alcohol use No     Allergies   Patient has no known allergies.   Review of Systems Review of Systems  All other systems reviewed and are negative.    Physical Exam Updated Vital Signs BP 106/75 (BP Location: Left Arm)   Pulse 69   Temp 97.3 F (36.3 C) (Oral)   Resp 18   Ht 6\' 3"  (1.905 m)   Wt 260 lb (117.9 kg)   SpO2 100%   BMI 32.50 kg/m   Physical Exam  Constitutional: He is oriented to person, place, and time. He appears well-developed and well-nourished.  HENT:  Head: Normocephalic and atraumatic.  Eyes: EOM are normal.  Neck: No tracheal deviation present. No thyromegaly present.  Mild right lateral neck tenderness without swelling or overlying skin changes.  No C-spine tenderness.  Cardiovascular: Normal rate, regular rhythm, normal heart sounds and intact distal pulses.   Pulmonary/Chest: Effort normal and breath sounds normal. No stridor. No respiratory distress.  Abdominal: Soft. He exhibits no distension. There is no tenderness.  Genitourinary:  Genitourinary Comments: Rectal examination demonstrates brown stool.  No fecal impaction noted.  No palpable masses  Musculoskeletal:  Mild pain with range of motion of right shoulder.  Mild tenderness of the right before meals joint.  No obvious tenderness or deformity noted of the right clavicle.  Normal right radial pulse.  No swelling of the right upper extremity as compared to left.  Mild right lateral chest wall tenderness without crepitus.  No bruising or rash noted  Lymphadenopathy:    He has no  cervical adenopathy.  Neurological: He is alert and oriented to person, place, and time.  Skin: Skin is warm and dry.  Psychiatric: He has a normal mood and affect. Judgment normal.  Nursing note and vitals reviewed.    ED Treatments / Results  Labs (all labs ordered are listed,  but only abnormal results are displayed) Labs Reviewed  CBC - Abnormal; Notable for the following:       Result Value   RBC 3.62 (*)    Hemoglobin 11.8 (*)    HCT 35.3 (*)    All other components within normal limits  BASIC METABOLIC PANEL - Abnormal; Notable for the following:    BUN 24 (*)    Calcium 8.8 (*)    All other components within normal limits  TROPONIN I    EKG  EKG Interpretation  Date/Time:  Tuesday November 05 2016 12:04:39 EST Ventricular Rate:  69 PR Interval:    QRS Duration: 93 QT Interval:  407 QTC Calculation: 436 R Axis:   -29 Text Interpretation:  Sinus rhythm Borderline left axis deviation Low voltage, precordial leads Consider anterior infarct No significant change was found Confirmed by Klever Twyford  MD, Lennette Bihari ([id number]) on 11/05/2016 12:54:35 PM       Radiology Dg Chest 2 View  Result Date: 11/05/2016 CLINICAL DATA:  Shortness of breath.  COPD. EXAM: CHEST  2 VIEW COMPARISON:  CT 10/10/2016.  Chest x-ray 10/10/2016 . FINDINGS: Mediastinum and hilar structures normal. Low lung volumes with mild bibasilar atelectasis. Mild infiltrate right lung base cannot be excluded. No pleural effusion or pneumothorax. Total right shoulder replacement. Interposition of the colon under the hemidiaphragms. IMPRESSION: 1. Low lung volumes with mild basilar atelectasis. Mild infiltrate right lung base cannot be excluded. 2. Cardiomegaly.  No pulmonary venous congestion. Electronically Signed   By: Marcello Moores  Register   On: 11/05/2016 12:59   Dg Cervical Spine Complete  Result Date: 11/05/2016 CLINICAL DATA:  Chronic neck pain.  No recent injury. EXAM: CERVICAL SPINE - COMPLETE 4+ VIEW COMPARISON:   04/18/2010 FINDINGS: Vertebral body alignment and heights are normal. There is moderate spondylosis throughout the cervical spine with hypertrophic anterior osteophytes without significant change. Minimal disc space narrowing at the C4-5 level. No compression fracture or subluxation. Examination demonstrates severe bilateral neural foraminal narrowing at the C3-4 level with moderate right-sided neural foraminal narrowing at the C4-5 level and moderate left-sided neural foraminal narrowing at the C2-3 level. There is uncovertebral joint spurring and moderate facet arthropathy. Atlantoaxial articulation is unremarkable. IMPRESSION: Moderate spondylosis of the cervical spine with mild disc space narrowing at the C4-5 level. Severe bilateral neural foraminal narrowing at the C3-4 level as well as moderate left-sided neural from narrowing at the C2-3 level and moderate right-sided neural foraminal narrowing at the C4-5 level. Electronically Signed   By: Marin Olp M.D.   On: 11/05/2016 13:01   Dg Shoulder Right  Result Date: 11/05/2016 CLINICAL DATA:  Chronic right shoulder pain.  Injury long time ago. EXAM: RIGHT SHOULDER - 2+ VIEW COMPARISON:  Chest x-ray 11/05/2016 FINDINGS: There are moderate degenerative changes of the [facility] joint. Right shoulder arthroplasty intact and normally located. No acute fracture or dislocation. IMPRESSION: No acute findings. Right shoulder arthroplasty intact and unchanged. Moderate degenerative change of the [facility] joint. Electronically Signed   By: Marin Olp M.D.   On: 11/05/2016 13:05   Dg Abd 2 Views  Result Date: 11/05/2016 CLINICAL DATA:  On chronic narcotics with constipation. EXAM: ABDOMEN - 2 VIEW COMPARISON:  CT 06/24/2016 FINDINGS: Bowel gas pattern is nonobstructive with significant fecal retention throughout the colon. No air-fluid levels or free peritoneal air. There are surgical clips over the right upper quadrant. Several pelvic phleboliths are present. There are  moderate degenerative changes of the spine, sacroiliac joints and hips left-greater-than-right. IMPRESSION:  Nonobstructive bowel gas pattern with significant fecal retention throughout the colon. Electronically Signed   By: Marin Olp M.D.   On: 11/05/2016 13:07    Procedures Procedures (including critical care time)  Medications Ordered in ED Medications  levofloxacin (LEVAQUIN) tablet 500 mg (500 mg Oral Given 11/05/16 1429)  morphine 4 MG/ML injection 6 mg (6 mg Intravenous Given 11/05/16 1429)     Initial Impression / Assessment and Plan / ED Course  I have reviewed the triage vital signs and the nursing notes.  Pertinent labs & imaging results that were available during my care of the patient were reviewed by me and considered in my medical decision making (see chart for details).  Clinical Course     Patient is overall well-appearing.  I suspect his right lateral chest discomfort is secondary to developing pneumonia and mild pleurisy.  Pain treated.  Patient be started on Levaquin.  Other plain films without abnormality.  He does have ongoing constipation.  I'll increase his MiraLAX from daily to twice a day until he starts having regular bowel movements.  He is on chronic opioid medication for chronic neck pain.  He'll continue his pain medicines for his chronic pain but he understands that these are likely slowing his gut motility and leading to his increasing constipation.  Primary care physician follow-up.  Overall well-appearing.  Patient understands to return to the ER for new or worsening symptoms  Final Clinical Impressions(s) / ED Diagnoses   Final diagnoses:  Constipation  Community acquired pneumonia of right lung, unspecified part of lung  Neck pain    New Prescriptions New Prescriptions   LEVOFLOXACIN (LEVAQUIN) 500 MG TABLET    Take 1 tablet (500 mg total) by mouth daily.   POLYETHYLENE GLYCOL POWDER (MIRALAX) POWDER    Take 17 g by mouth 2 (two) times daily.      Jola Schmidt, MD 11/05/16 1620

## 2016-11-05 NOTE — ED Notes (Signed)
Bed: [id number] Expected date:  Expected time:  Means of arrival:  Comments: EMS-neck pain/chronic

## 2016-12-22 ENCOUNTER — Emergency Department (HOSPITAL_COMMUNITY): Payer: Medicare Other

## 2016-12-22 ENCOUNTER — Emergency Department (HOSPITAL_COMMUNITY)
Admission: EM | Admit: 2016-12-22 | Discharge: 2016-12-22 | Disposition: A | Discharge status: Home / Self Care | Payer: Medicare Other | Attending: Emergency Medicine | Admitting: Emergency Medicine

## 2016-12-22 ENCOUNTER — Encounter (HOSPITAL_COMMUNITY): Payer: Self-pay | Admitting: *Deleted

## 2016-12-22 DIAGNOSIS — N183 Chronic kidney disease, stage 3 (moderate): Secondary | ICD-10-CM | POA: Diagnosis not present

## 2016-12-22 DIAGNOSIS — E039 Hypothyroidism, unspecified: Secondary | ICD-10-CM | POA: Insufficient documentation

## 2016-12-22 DIAGNOSIS — I130 Hypertensive heart and chronic kidney disease with heart failure and stage 1 through stage 4 chronic kidney disease, or unspecified chronic kidney disease: Secondary | ICD-10-CM | POA: Diagnosis not present

## 2016-12-22 DIAGNOSIS — I2510 Atherosclerotic heart disease of native coronary artery without angina pectoris: Secondary | ICD-10-CM | POA: Insufficient documentation

## 2016-12-22 DIAGNOSIS — F419 Anxiety disorder, unspecified: Secondary | ICD-10-CM | POA: Diagnosis present

## 2016-12-22 DIAGNOSIS — J449 Chronic obstructive pulmonary disease, unspecified: Secondary | ICD-10-CM | POA: Insufficient documentation

## 2016-12-22 DIAGNOSIS — F13230 Sedative, hypnotic or anxiolytic dependence with withdrawal, uncomplicated: Secondary | ICD-10-CM | POA: Insufficient documentation

## 2016-12-22 DIAGNOSIS — I252 Old myocardial infarction: Secondary | ICD-10-CM | POA: Insufficient documentation

## 2016-12-22 DIAGNOSIS — I5032 Chronic diastolic (congestive) heart failure: Secondary | ICD-10-CM | POA: Diagnosis not present

## 2016-12-22 DIAGNOSIS — F13939 Sedative, hypnotic or anxiolytic use, unspecified with withdrawal, unspecified: Secondary | ICD-10-CM

## 2016-12-22 DIAGNOSIS — Z96653 Presence of artificial knee joint, bilateral: Secondary | ICD-10-CM | POA: Diagnosis not present

## 2016-12-22 DIAGNOSIS — F13239 Sedative, hypnotic or anxiolytic dependence with withdrawal, unspecified: Secondary | ICD-10-CM

## 2016-12-22 LAB — I-STAT CHEM 8, ED
BUN: 17 mg/dL (ref 6–20)
CALCIUM ION: 0.8 mmol/L — AB (ref 1.15–1.40)
CREATININE: 1.2 mg/dL (ref 0.61–1.24)
Chloride: 100 mmol/L — ABNORMAL LOW (ref 101–111)
GLUCOSE: 104 mg/dL — AB (ref 65–99)
HCT: 37 % — ABNORMAL LOW (ref 39.0–52.0)
Hemoglobin: 12.6 g/dL — ABNORMAL LOW (ref 13.0–17.0)
POTASSIUM: 3.5 mmol/L (ref 3.5–5.1)
Sodium: 141 mmol/L (ref 135–145)
TCO2: 23 mmol/L (ref 0–100)

## 2016-12-22 LAB — BASIC METABOLIC PANEL
Anion gap: 12 (ref 5–15)
BUN: 18 mg/dL (ref 6–20)
CALCIUM: 7.1 mg/dL — AB (ref 8.9–10.3)
CHLORIDE: 102 mmol/L (ref 101–111)
CO2: 26 mmol/L (ref 22–32)
CREATININE: 1.2 mg/dL (ref 0.61–1.24)
GFR calc Af Amer: >60 mL/min (ref >60)
GFR calc non Af Amer: 57 mL/min — ABNORMAL LOW (ref >60)
GLUCOSE: 106 mg/dL — AB (ref 65–99)
Potassium: 3.5 mmol/L (ref 3.5–5.1)
Sodium: 140 mmol/L (ref 135–145)

## 2016-12-22 LAB — CBC
HCT: 38 % — ABNORMAL LOW (ref 39.0–52.0)
Hemoglobin: 12.5 g/dL — ABNORMAL LOW (ref 13.0–17.0)
MCH: 31.6 pg (ref 26.0–34.0)
MCHC: 32.9 g/dL (ref 30.0–36.0)
MCV: 96.2 fL (ref 78.0–100.0)
Platelets: 192 10*3/uL (ref 150–400)
RBC: 3.95 MIL/uL — AB (ref 4.22–5.81)
RDW: 13.2 % (ref 11.5–15.5)
WBC: 9.7 10*3/uL (ref 4.0–10.5)

## 2016-12-22 LAB — BRAIN NATRIURETIC PEPTIDE: B NATRIURETIC PEPTIDE 5: 24.8 pg/mL (ref 0.0–100.0)

## 2016-12-22 LAB — I-STAT TROPONIN, ED: TROPONIN I, POC: 0 ng/mL (ref 0.00–0.08)

## 2016-12-22 MED ORDER — ALPRAZOLAM 0.25 MG PO TABS
0.2500 mg | ORAL_TABLET | Freq: Once | ORAL | Status: AC
  Administered 2016-12-22: 0.25 mg via ORAL
  Filled 2016-12-22: qty 1

## 2016-12-22 NOTE — ED Provider Notes (Signed)
[facility] DEPT Provider Note  Medical screening examination/treatment/procedure(s) were conducted as a shared visit with non-physician practitioner(s) and myself.  I personally evaluated the patient during the encounter.   CSN: [id number] Arrival date & time: 12/22/16  0005   By signing my name below, I, Soijett Blue, attest that this documentation has been prepared under the direction and in the presence of Oliver Neuwirth, MD. Electronically Signed: Soijett Blue, ED Scribe. 12/22/16. 1:21 AM.  History   Chief Complaint Chief Complaint  Patient presents with  . Chest Pain    HPI Srihith Nix Burston is a 76 y.o. male with a PMHx of COPD, CAD, MI, dementia, who presents to the Emergency Department brought in by EMS from [facility], complaining of medical withdrawal onset 3 days ago. Pt reports associated restlessness and increased anxiety. Pt has not tried any medications for the relief of his symptoms. He notes that he ran out of his medications due to being informed that he would have to pay $600 for his medications. Pt states that he was recently noted that his medications are now paid for and he is currently waiting for his medications. He denies any other symptoms.    The history is provided by the patient. No language interpreter was used.    Past Medical History:  Diagnosis Date  . Allergic rhinitis, cause unspecified   . Arthritis    osteoarthritis  . Cancer ([place])    renal carcinoma  . COPD (chronic obstructive pulmonary disease) (HCC)    [name] FOLLOWS PT--PT USING OXYGEN 24 HRS A DAY  . COPD (chronic obstructive pulmonary disease) (HCC)    PULMONARY CLEARANCE FOR LEFT RENAL CRYOABLATION ON CHART FROM DR. CLANCE -07/26/11  . Coronary artery disease    CARDIAC CLEARANCE  OFFICE NOTE FOR LEFT CRYO ABLATION ON CHART FROM [name] -"RECENT STRESS TEST DID NOT SHOW ISCHEMIA" -"PRESUMED CAD DUE TO CORONARY CALCIFICATIONS"   . DEMENTIA    Alzheimer's, senile  dementia  . Family history of ischemic heart disease   . Family history of malignant neoplasm of gastrointestinal tract   . GERD (gastroesophageal reflux disease)   . Hypothyroidism   . Myocardial infarction   . Obesity hypoventilation syndrome ([place])    failed cpap/bipap trial  . Other and unspecified hyperlipidemia   . Other congenital hamartoses, not elsewhere classified   . Pneumonia    HX     . Shortness of breath   . Sleep apnea    OXYGEN 24 HR/DAY-NO CPAP  . Unspecified essential hypertension     Patient Active Problem List   Diagnosis Date Noted  . Musculoskeletal chest pain   . Bronchitis 10/10/2016  . CKD (chronic kidney disease) stage 3, GFR 30-59 ml/min 10/10/2016  . HTN (hypertension) 10/10/2016  . Hypothyroid 10/10/2016  . Chronic diastolic CHF (congestive heart failure) ([place]) 03/03/2016  . Coronary artery disease 03/03/2016  . Thoracic aortic aneurysm ([place]) 03/03/2016  . Dementia 02/23/2016  . COPD (chronic obstructive pulmonary disease) ([place]) 02/23/2016  . Oliguria 02/23/2016  . Atypical chest pain 02/23/2016  . Chronic respiratory failure with hypoxia and hypercapnia ([place]) 09/27/2015  . Morbid obesity ([place]) 07/13/2015  . Dyspnea on exertion 07/12/2015  . OSA (obstructive sleep apnea) 11/15/2011  . Renal mass, left 09/14/2011  . DISORDERS OF DIAPHRAGM 07/02/2010  . OBESITY HYPOVENTILATION SYNDROME 02/14/2009  . Multiple pulmonary nodules 02/14/2009  . CHOLELITHIASIS 05/04/2008  . Nonspecific (abnormal) findings on radiological and other examination of body structure 04/28/2008  .  ABNORMAL CHEST XRAY 04/28/2008  . 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 HYPOPARATHYROIDISM 03/31/2008  . HYPOCALCEMIA 03/31/2008  . 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 DYSLIPIDEMIA 09/21/2007  . Hypertensive heart disease with CHF (congestive heart failure) ([place]) 09/21/2007  . Allergic rhinitis 09/21/2007    Past Surgical History:  Procedure Laterality Date    . CARPAL TUNNEL RELEASE     right  . CHOLECYSTECTOMY  2009  . EYE SURGERY     bilateral cataracts  . JOINT REPLACEMENT     bilateral knee replacements  . KNEE ARTHROPLASTY     2005 LEFT   AND 2007 RIGHT--[name]  . THYROIDECTOMY    . TUMOR REMOVAL  09/14/2011       Home Medications    Prior to Admission medications   Medication Sig Start Date End Date Taking? Authorizing Provider  albuterol (PROVENTIL) (2.5 MG/3ML) 0.083% nebulizer solution Take 2.5 mg by nebulization 2 (two) times daily. Reported on 03/13/2016    Historical Provider, MD  ALPRAZolam Duanne Moron) 0.25 MG tablet Take 0.25 mg by mouth every 12 (twelve) hours as needed for anxiety.    Historical Provider, MD  calcium carbonate (TUMS) 500 MG chewable tablet Chew 2 tablets by mouth daily as needed for indigestion or heartburn.    Historical Provider, MD  Calcium Carbonate-Vitamin D (CALCIUM PLUS VITAMIN D PO) Take 1 tablet by mouth 2 (two) times daily.    Historical Provider, MD  carboxymethylcellulose 1 % ophthalmic solution Place 1 drop into both eyes 2 (two) times daily.    Historical Provider, MD  cetirizine (ZYRTEC) 10 MG tablet Take 10 mg by mouth daily.    Historical Provider, MD  clopidogrel (PLAVIX) 75 MG tablet Take 75 mg by mouth every morning.     Historical Provider, MD  cyanocobalamin 500 MCG tablet Take 500 mcg by mouth daily.    Historical Provider, MD  cycloSPORINE (RESTASIS) 0.05 % ophthalmic emulsion Place 1 drop into both eyes 2 (two) times daily.    Historical Provider, MD  Dextromethorphan-Guaifenesin (DIABETIC TUSSIN MAX ST PO) Take 5 mLs by mouth 2 (two) times daily as needed (cough).    Historical Provider, MD  diclofenac sodium (VOLTAREN) 1 % GEL Apply 2 g topically 2 (two) times daily as needed (pain).     Historical Provider, MD  fluticasone (FLONASE) 50 MCG/ACT nasal spray Place 2 sprays into the nose daily as needed for allergies.     Historical Provider, MD  HYDROcodone-acetaminophen  (NORCO/VICODIN) 5-325 MG tablet Take 1 tablet by mouth every 6 (six) hours as needed for moderate pain. Patient taking differently: Take 1 tablet by mouth 2 (two) times daily.  02/27/16   Domenic Polite, MD  ipratropium (ATROVENT) 0.02 % nebulizer solution Take 0.5 mg by nebulization 2 (two) times daily.     Historical Provider, MD  levofloxacin (LEVAQUIN) 500 MG tablet Take 1 tablet (500 mg total) by mouth daily. 11/05/16   Jola Schmidt, MD  levothyroxine (SYNTHROID, LEVOTHROID) 200 MCG tablet Take 200 mcg by mouth daily before breakfast.    Historical Provider, MD  levothyroxine (SYNTHROID, LEVOTHROID) 25 MCG tablet Take 25 mcg by mouth daily before breakfast.    Historical Provider, MD  magnesium citrate SOLN Take 1 Bottle by mouth daily as needed for moderate constipation or severe constipation.    Historical Provider, MD  Melatonin 3 MG TABS Take 3 mg by mouth at bedtime as needed.    Historical Provider, MD  methocarbamol (ROBAXIN) 500 MG tablet Take 2  tablets (1,000 mg total) by mouth 3 (three) times daily between meals as needed. 10/24/16   Tanna Furry, MD  metoprolol tartrate (LOPRESSOR) 25 MG tablet Take 12.5 mg by mouth 2 (two) times daily. Hold for systolic blood pressure [id number]    Historical Provider, MD  mometasone-formoterol (DULERA) 100-5 MCG/ACT AERO Inhale 2 puffs into the lungs 2 (two) times daily.    Historical Provider, MD  nystatin cream (MYCOSTATIN) Apply 1 application topically 2 (two) times daily as needed for dry skin.    Historical Provider, MD  OXYGEN Inhale into the lungs at bedtime.    Historical Provider, MD  oxymetazoline (AFRIN) 0.05 % nasal spray Place 1 spray into both nostrils 2 (two) times daily as needed for congestion.    Historical Provider, MD  pantoprazole (PROTONIX) 20 MG tablet Take 1 tablet (20 mg total) by mouth daily. 06/24/16   Lacretia Leigh, MD  polyethylene glycol powder (MIRALAX) powder Take 17 g by mouth 2 (two) times daily. 11/05/16   Jola Schmidt, MD    sennosides-docusate sodium (SENOKOT-S) 8.6-50 MG tablet Take 2 tablets by mouth at bedtime.    Historical Provider, MD  sodium chloride (OCEAN) 0.65 % SOLN nasal spray Place 2 sprays into both nostrils 3 (three) times daily as needed for congestion.    Historical Provider, MD  Sodium Chloride, Inhalant, 7 % NEBU Inhale 1 vial into the lungs daily as needed (for wheezing).     Historical Provider, MD  Tamsulosin HCl (FLOMAX) 0.4 MG CAPS Take 0.4 mg by mouth daily after supper. Hold if SBP < 100    Historical Provider, MD  traZODone (DESYREL) 150 MG tablet Take 300 mg by mouth at bedtime.    Historical Provider, MD    Family History Family History  Problem Relation Age of Onset  . Cancer Other     colon  . Heart disease Other     CAD, MI and Heart Attack    Social History Social History  Substance Use Topics  . Smoking status: Never Smoker  . Smokeless tobacco: Never Used  . Alcohol use No     Allergies   Patient has no known allergies.   Review of Systems Review of Systems  Constitutional: Negative for chills and fever.       +restlessness  HENT: Negative for drooling and facial swelling.   Eyes: Negative for photophobia.  Respiratory: Negative for shortness of breath.   Cardiovascular: Negative for chest pain, palpitations and leg swelling.  Gastrointestinal: Negative for anal bleeding.  Genitourinary: Negative for difficulty urinating.  Neurological: Negative for facial asymmetry and speech difficulty.  Psychiatric/Behavioral: Negative for suicidal ideas.       +increased anxiety  All other systems reviewed and are negative.    Physical Exam Updated Vital Signs BP 113/66 (BP Location: Left Arm)   Pulse 98   Temp 98.3 F (36.8 C) (Oral)   Resp 20   Ht 6\' 3"  (1.905 m)   Wt 250 lb (113.4 kg)   SpO2 100%   BMI 31.25 kg/m   Physical Exam  Constitutional: He is oriented to person, place, and time. He appears well-developed and well-nourished. No distress.   HENT:  Head: Normocephalic and atraumatic.  Mouth/Throat: Oropharynx is clear and moist. No oropharyngeal exudate.  Eyes: Conjunctivae and EOM are normal. Pupils are equal, round, and reactive to light.  Neck: Trachea normal. Neck supple. No JVD present. Carotid bruit is not present. No tracheal deviation present.  Trachea midline. No stridor.  No bruits.   Cardiovascular: Normal rate, regular rhythm and normal heart sounds.  Exam reveals no gallop and no friction rub.   No murmur heard. Pulmonary/Chest: Effort normal and breath sounds normal. No stridor. No respiratory distress. He has no wheezes. He has no rales.  Abdominal: Soft. Bowel sounds are normal. He exhibits no distension and no mass. There is no tenderness. There is no rebound and no guarding.  Musculoskeletal: Normal range of motion.  Neurological: He is alert and oriented to person, place, and time. He has normal reflexes.  Skin: Skin is warm and dry. He is not diaphoretic.  Psychiatric: He has a normal mood and affect. His behavior is normal.  Nursing note and vitals reviewed.    ED Treatments / Results  DIAGNOSTIC STUDIES: Oxygen Saturation is 100% on RA, nl by my interpretation.    COORDINATION OF CARE: 1:14 AM Discussed treatment plan with pt at bedside and pt agreed to plan.   Labs (all labs ordered are listed, but only abnormal results are displayed) Results for orders placed or performed during the hospital encounter of 12/22/16  CBC  Result Value Ref Range   WBC 9.7 4.0 - 10.5 K/uL   RBC 3.95 (L) 4.22 - 5.81 MIL/uL   Hemoglobin 12.5 (L) 13.0 - 17.0 g/dL   HCT 38.0 (L) 39.0 - 52.0 %   MCV 96.2 78.0 - 100.0 fL   MCH 31.6 26.0 - 34.0 pg   MCHC 32.9 30.0 - 36.0 g/dL   RDW 13.2 11.5 - 15.5 %   Platelets 192 150 - 400 K/uL  Brain natriuretic peptide  Result Value Ref Range   B Natriuretic Peptide 24.8 0.0 - 100.0 pg/mL  I-stat chem 8, ed  Result Value Ref Range   Sodium 141 135 - 145 mmol/L   Potassium  3.5 3.5 - 5.1 mmol/L   Chloride 100 (L) 101 - 111 mmol/L   BUN 17 6 - 20 mg/dL   Creatinine, Ser 1.20 0.61 - 1.24 mg/dL   Glucose, Bld 104 (H) 65 - 99 mg/dL   Calcium, Ion 0.80 (LL) 1.15 - 1.40 mmol/L   TCO2 23 0 - 100 mmol/L   Hemoglobin 12.6 (L) 13.0 - 17.0 g/dL   HCT 37.0 (L) 39.0 - 52.0 %   Dg Chest 2 View  Result Date: 12/22/2016 CLINICAL DATA:  Shortness of breath. EXAM: CHEST  2 VIEW COMPARISON:  Radiographs 11/05/2016 FINDINGS: The cardiomediastinal contours are unchanged with mild cardiomegaly. Streaky bibasilar atelectasis or scarring again seen, similar to prior. Pulmonary vasculature is normal. No consolidation, pleural effusion, or pneumothorax. No acute osseous abnormalities are seen. Reverse right shoulder arthroplasty. IMPRESSION: Bibasilar atelectasis or scarring, similar to prior exam. No new abnormality. Electronically Signed   By: Jeb Levering M.D.   On: 12/22/2016 01:53     EKG Interpretation  Date/Time:  Sunday December 22 2016 01:21:54 EST Ventricular Rate:  97 PR Interval:    QRS Duration: 88 QT Interval:  351 QTC Calculation: 446 R Axis:   -15 Text Interpretation:  Sinus rhythm Atrial premature complex Low voltage, precordial leads Probable anteroseptal infarct, old Confirmed by [facility]  MD, Jakeel Starliper ([id number]) on 12/22/2016 1:45:14 AM       Procedures Procedures (including critical care time)     Clinical Course as of Dec 23 318  Sun Dec 22, 2016  0213 Calcium Ionized: (!!) 0.80 [AH]  0214 Calcium Ionized: (!!) 0.80 [AH]    Clinical Course User Index [AH] Margarita Mail, PA-C  This is a 76 y.o. -year-old male presents with being out of his narcotics and benzos.  The patient is nontoxic-appearing on exam and vital signs are within normal limits.     After history, exam, and medical workup I feel the patient has been appropriately medically screened and is safe for discharge home. Pertinent diagnoses were discussed with the patient. Patient  was given return precautions.  I personally performed the services described in this documentation, which was scribed in my presence. The recorded information has been reviewed and is accurate.      Veatrice Kells, MD 12/22/16 [id number]

## 2016-12-22 NOTE — ED Notes (Signed)
Bed: [id number] Expected date:  Expected time:  Means of arrival:  Comments: 18m cut off medication for 3 days chest wall pain

## 2016-12-22 NOTE — ED Triage Notes (Signed)
Pt from [place] [facility] and has been cut off of his medications from pharmacy 3 days ago.  Pt c/o withdrawal from meds.  Pt very anxious and c/o left chest pain.

## 2016-12-22 NOTE — ED Triage Notes (Signed)
EMS blood glucose was: 159.

## 2016-12-22 NOTE — Discharge Instructions (Signed)
You need to follow up with your doctors and care takers for your medication needs. We will not be able to supply you with your medications and you do not meet admission criteria.  We are also not able to refill your hydrocodone or your xanax because these are controlled substances and you will need to have your doctor refill them.

## 2017-02-27 ENCOUNTER — Encounter (HOSPITAL_COMMUNITY): Payer: Self-pay | Admitting: Pharmacy Technician

## 2017-02-27 ENCOUNTER — Emergency Department (HOSPITAL_COMMUNITY): Payer: Medicare Other

## 2017-02-27 ENCOUNTER — Emergency Department (HOSPITAL_COMMUNITY)
Admission: EM | Admit: 2017-02-27 | Discharge: 2017-02-27 | Disposition: A | Discharge status: Home / Self Care | Payer: Medicare Other | Attending: Emergency Medicine | Admitting: Emergency Medicine

## 2017-02-27 DIAGNOSIS — Z96653 Presence of artificial knee joint, bilateral: Secondary | ICD-10-CM | POA: Diagnosis not present

## 2017-02-27 DIAGNOSIS — I2510 Atherosclerotic heart disease of native coronary artery without angina pectoris: Secondary | ICD-10-CM | POA: Insufficient documentation

## 2017-02-27 DIAGNOSIS — E039 Hypothyroidism, unspecified: Secondary | ICD-10-CM | POA: Insufficient documentation

## 2017-02-27 DIAGNOSIS — I5032 Chronic diastolic (congestive) heart failure: Secondary | ICD-10-CM | POA: Diagnosis not present

## 2017-02-27 DIAGNOSIS — I130 Hypertensive heart and chronic kidney disease with heart failure and stage 1 through stage 4 chronic kidney disease, or unspecified chronic kidney disease: Secondary | ICD-10-CM | POA: Diagnosis not present

## 2017-02-27 DIAGNOSIS — N183 Chronic kidney disease, stage 3 (moderate): Secondary | ICD-10-CM | POA: Diagnosis not present

## 2017-02-27 DIAGNOSIS — I252 Old myocardial infarction: Secondary | ICD-10-CM | POA: Insufficient documentation

## 2017-02-27 DIAGNOSIS — Z79899 Other long term (current) drug therapy: Secondary | ICD-10-CM | POA: Diagnosis not present

## 2017-02-27 DIAGNOSIS — J449 Chronic obstructive pulmonary disease, unspecified: Secondary | ICD-10-CM | POA: Diagnosis not present

## 2017-02-27 DIAGNOSIS — R072 Precordial pain: Secondary | ICD-10-CM | POA: Insufficient documentation

## 2017-02-27 DIAGNOSIS — R0789 Other chest pain: Secondary | ICD-10-CM | POA: Diagnosis present

## 2017-02-27 LAB — BASIC METABOLIC PANEL
Anion gap: 11 (ref 5–15)
BUN: 18 mg/dL (ref 6–20)
CALCIUM: 5.9 mg/dL — AB (ref 8.9–10.3)
CHLORIDE: 101 mmol/L (ref 101–111)
CO2: 26 mmol/L (ref 22–32)
CREATININE: 1.08 mg/dL (ref 0.61–1.24)
GFR calc non Af Amer: >60 mL/min (ref >60)
GLUCOSE: 96 mg/dL (ref 65–99)
Potassium: 4.5 mmol/L (ref 3.5–5.1)
Sodium: 138 mmol/L (ref 135–145)

## 2017-02-27 LAB — CBC
HEMATOCRIT: 33.6 % — AB (ref 39.0–52.0)
HEMOGLOBIN: 11 g/dL — AB (ref 13.0–17.0)
MCH: 31.7 pg (ref 26.0–34.0)
MCHC: 32.7 g/dL (ref 30.0–36.0)
MCV: 96.8 fL (ref 78.0–100.0)
Platelets: 193 10*3/uL (ref 150–400)
RBC: 3.47 MIL/uL — ABNORMAL LOW (ref 4.22–5.81)
RDW: 14.2 % (ref 11.5–15.5)
WBC: 7.3 10*3/uL (ref 4.0–10.5)

## 2017-02-27 LAB — I-STAT TROPONIN, ED
TROPONIN I, POC: 0 ng/mL (ref 0.00–0.08)
Troponin i, poc: 0 ng/mL (ref 0.00–0.08)

## 2017-02-27 LAB — HEPATIC FUNCTION PANEL
ALBUMIN: 3.4 g/dL — AB (ref 3.5–5.0)
ALK PHOS: 75 U/L (ref 38–126)
ALT: 13 U/L — ABNORMAL LOW (ref 17–63)
AST: 30 U/L (ref 15–41)
BILIRUBIN TOTAL: 0.7 mg/dL (ref 0.3–1.2)
Bilirubin, Direct: 0.2 mg/dL (ref 0.1–0.5)
Indirect Bilirubin: 0.5 mg/dL (ref 0.3–0.9)
TOTAL PROTEIN: 6.5 g/dL (ref 6.5–8.1)

## 2017-02-27 LAB — MAGNESIUM: Magnesium: 1.8 mg/dL (ref 1.7–2.4)

## 2017-02-27 LAB — BRAIN NATRIURETIC PEPTIDE: B Natriuretic Peptide: 27.6 pg/mL (ref 0.0–100.0)

## 2017-02-27 MED ORDER — CALCIUM CARBONATE-VITAMIN D 500-50 MG-UNIT PO CAPS: 1.0000 | ORAL_CAPSULE | Freq: Four times a day (QID) | ORAL | 0 refills | Status: DC

## 2017-02-27 NOTE — Discharge Instructions (Signed)
Please read and follow all provided instructions.  Your diagnoses today include:  1. Precordial pain   2. Hypocalcemia     Tests performed today include:  An EKG of your heart  A chest x-ray  Cardiac enzymes - a blood test for heart muscle damage  Blood counts and electrolytes - calcium was very low  Vital signs. See below for your results today.   Medications prescribed:   Calcium carbonate - medication to help increase your calcium  Take any prescribed medications only as directed.  Follow-up instructions: You need to increase your calcium carbonate tablets to 4 times a day.   Please follow-up with your primary care provider as soon as you can for further evaluation of your symptoms.   Return instructions:  SEEK IMMEDIATE MEDICAL ATTENTION IF:  You have severe chest pain, especially if the pain is crushing or pressure-like and spreads to the arms, back, neck, or jaw, or if you have sweating, nausea (feeling sick to your stomach), or shortness of breath. THIS IS AN EMERGENCY. Don't wait to see if the pain will go away. Get medical help at once. Call 911 or [phone] (operator). DO NOT drive yourself to the hospital.   Your chest pain gets worse and does not go away with rest.   You have an attack of chest pain lasting longer than usual, despite rest and treatment with the medications your caregiver has prescribed.   You wake from sleep with chest pain or shortness of breath.  You feel dizzy or faint.  You have chest pain not typical of your usual pain for which you originally saw your caregiver.   You have any other emergent concerns regarding your health.  Additional Information: Chest pain comes from many different causes. Your caregiver has diagnosed you as having chest pain that is not specific for one problem, but does not require admission.  You are at low risk for an acute heart condition or other serious illness.   Your vital signs today were: BP 112/79    Pulse 72     Resp 18    SpO2 95%  If your blood pressure (BP) was elevated above 135/85 this visit, please have this repeated by your doctor within one month. --------------

## 2017-02-27 NOTE — ED Triage Notes (Signed)
Pt arrives from [facility] via EMS with reports of CP for 2 days. Pt also complains of increasing sob. Pt on home oxygen at 2-3L. Pt describes CP as pressure to center chest. Pt has swelling noted to BLE.

## 2017-02-27 NOTE — ED Notes (Signed)
Patient transported to X-ray 

## 2017-02-27 NOTE — ED Provider Notes (Signed)
[place] DEPT Provider Note   CSN: [id number] Arrival date & time: 02/27/17  1622     History   Chief Complaint Chief Complaint  Patient presents with  . Chest Pain    HPI [name] [name] is a 76 y.o. male.  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 Patient had worsening symptoms this afternoon and EMS was called by staff at [facility]. EMS gave 324 mg of aspirin, no nitroglycerin. Pressure improved. Denies associated pressure with exertion, diaphoresis. He states that staff turned his oxygen up to 4 L which seemed to help. No fever, wheezing, cough. No nausea, vomiting. Patient reports swelling to lower extremities and bruise on right toe. The onset of this condition was acute. The course is resolved. Aggravating factors: none. Alleviating factors: none.        Past Medical History:  Diagnosis Date  . Allergic rhinitis, cause unspecified   . Arthritis    osteoarthritis  . Cancer ([place])    renal carcinoma  . COPD (chronic obstructive pulmonary disease) (HCC)    [name] FOLLOWS PT--PT USING OXYGEN 24 HRS A DAY  . COPD (chronic obstructive pulmonary disease) (HCC)    PULMONARY CLEARANCE FOR LEFT RENAL CRYOABLATION ON CHART FROM DR. CLANCE -07/26/11  . Coronary artery disease    CARDIAC CLEARANCE  OFFICE NOTE FOR LEFT CRYO ABLATION ON CHART FROM [name] -"RECENT STRESS TEST DID NOT SHOW ISCHEMIA" -"PRESUMED CAD DUE TO CORONARY CALCIFICATIONS"   . DEMENTIA    Alzheimer's, senile dementia  . Family history of ischemic heart disease   . Family history of malignant neoplasm of gastrointestinal tract   . GERD (gastroesophageal reflux disease)   . Hypothyroidism   . Myocardial infarction ([place])   . Obesity hypoventilation syndrome ([place])    failed cpap/bipap trial  . Other and unspecified hyperlipidemia   . Other  congenital hamartoses, not elsewhere classified   . Pneumonia    HX     . Shortness of breath   . Sleep apnea    OXYGEN 24 HR/DAY-NO CPAP  . Unspecified essential hypertension     Patient Active Problem List   Diagnosis Date Noted  . Musculoskeletal chest pain   . Bronchitis 10/10/2016  . CKD (chronic kidney disease) stage 3, GFR 30-59 ml/min 10/10/2016  . HTN (hypertension) 10/10/2016  . Hypothyroid 10/10/2016  . Chronic diastolic CHF (congestive heart failure) ([place]) 03/03/2016  . Coronary artery disease 03/03/2016  . Thoracic aortic aneurysm ([place]) 03/03/2016  . Dementia 02/23/2016  . COPD (chronic obstructive pulmonary disease) ([place]) 02/23/2016  . Oliguria 02/23/2016  . Atypical chest pain 02/23/2016  . Chronic respiratory failure with hypoxia and hypercapnia ([place]) 09/27/2015  . Morbid obesity ([place]) 07/13/2015  . Dyspnea on exertion 07/12/2015  . OSA (obstructive sleep apnea) 11/15/2011  . Renal mass, left 09/14/2011  . DISORDERS OF DIAPHRAGM 07/02/2010  . OBESITY HYPOVENTILATION SYNDROME 02/14/2009  . Multiple pulmonary nodules 02/14/2009  . CHOLELITHIASIS 05/04/2008  . Nonspecific (abnormal) findings on radiological and other examination of body structure 04/28/2008  . ABNORMAL CHEST XRAY 04/28/2008  . 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 HYPOPARATHYROIDISM 03/31/2008  . HYPOCALCEMIA 03/31/2008  . 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 DYSLIPIDEMIA 09/21/2007  . Hypertensive heart disease with CHF (congestive heart failure) ([place]) 09/21/2007  . Allergic rhinitis 09/21/2007    Past Surgical History:  Procedure Laterality Date  .  CARPAL TUNNEL RELEASE     right  . CHOLECYSTECTOMY  2009  . EYE SURGERY     bilateral cataracts  . JOINT REPLACEMENT     bilateral knee replacements  . KNEE ARTHROPLASTY     2005 LEFT   AND 2007 RIGHT--[name]  . THYROIDECTOMY    . TUMOR REMOVAL  09/14/2011       Home Medications    Prior to Admission medications    Medication Sig Start Date End Date Taking? Authorizing Provider  albuterol (PROVENTIL) (2.5 MG/3ML) 0.083% nebulizer solution Take 2.5 mg by nebulization 2 (two) times daily. Reported on 03/13/2016    [provider]  ALPRAZolam Duanne Moron) 0.25 MG tablet Take 0.25 mg by mouth every 12 (twelve) hours as needed for anxiety.    [provider]  calcium carbonate (TUMS) 500 MG chewable tablet Chew 2 tablets by mouth daily as needed for indigestion or heartburn.    [provider]  Calcium Carbonate-Vitamin D (CALCIUM PLUS VITAMIN D PO) Take 1 tablet by mouth 2 (two) times daily.    [provider]  carboxymethylcellulose 1 % ophthalmic solution Place 1 drop into both eyes 2 (two) times daily.    [provider]  cetirizine (ZYRTEC) 10 MG tablet Take 10 mg by mouth daily.    [provider]  clopidogrel (PLAVIX) 75 MG tablet Take 75 mg by mouth every morning.     [provider]  cyanocobalamin 500 MCG tablet Take 500 mcg by mouth daily.    [provider]  cycloSPORINE (RESTASIS) 0.05 % ophthalmic emulsion Place 1 drop into both eyes 2 (two) times daily.    [provider]  Dextromethorphan-Guaifenesin (DIABETIC TUSSIN MAX ST PO) Take 5 mLs by mouth 2 (two) times daily as needed (cough).    [provider]  diclofenac sodium (VOLTAREN) 1 % GEL Apply 2 g topically 2 (two) times daily as needed (pain).     [provider]  fluticasone (FLONASE) 50 MCG/ACT nasal spray Place 2 sprays into the nose daily as needed for allergies.     [provider]  HYDROcodone-acetaminophen (NORCO/VICODIN) 5-325 MG tablet Take 1 tablet by mouth every 6 (six) hours as needed for moderate pain. Patient taking differently: Take 1 tablet by mouth 2 (two) times daily.  02/27/16   Domenic Polite, MD  ipratropium (ATROVENT) 0.02 % nebulizer solution Take 0.5 mg by nebulization 2 (two) times daily.     [provider]    levofloxacin (LEVAQUIN) 500 MG tablet Take 1 tablet (500 mg total) by mouth daily. 11/05/16   Jola Schmidt, MD  levothyroxine (SYNTHROID, LEVOTHROID) 200 MCG tablet Take 200 mcg by mouth daily before breakfast.    [provider]  levothyroxine (SYNTHROID, LEVOTHROID) 25 MCG tablet Take 25 mcg by mouth daily before breakfast.    [provider]  magnesium citrate SOLN Take 1 Bottle by mouth daily as needed for moderate constipation or severe constipation.    [provider]  Melatonin 3 MG TABS Take 3 mg by mouth at bedtime as needed.    [provider]  methocarbamol (ROBAXIN) 500 MG tablet Take 2 tablets (1,000 mg total) by mouth 3 (three) times daily between meals as needed. 10/24/16   Tanna Furry, MD  metoprolol tartrate (LOPRESSOR) 25 MG tablet Take 12.5 mg by mouth 2 (two) times daily. Hold for systolic blood pressure <151    [provider]  mometasone-formoterol (DULERA) 100-5 MCG/ACT AERO Inhale 2 puffs  into the lungs 2 (two) times daily.    [provider]  nystatin cream (MYCOSTATIN) Apply 1 application topically 2 (two) times daily as needed for dry skin.    [provider]  OXYGEN Inhale into the lungs at bedtime.    [provider]  oxymetazoline (AFRIN) 0.05 % nasal spray Place 1 spray into both nostrils 2 (two) times daily as needed for congestion.    [provider]  pantoprazole (PROTONIX) 20 MG tablet Take 1 tablet (20 mg total) by mouth daily. 06/24/16   Lacretia Leigh, MD  polyethylene glycol powder (MIRALAX) powder Take 17 g by mouth 2 (two) times daily. 11/05/16   Jola Schmidt, MD  sennosides-docusate sodium (SENOKOT-S) 8.6-50 MG tablet Take 2 tablets by mouth at bedtime.    [provider]  sodium chloride (OCEAN) 0.65 % SOLN nasal spray Place 2 sprays into both nostrils 3 (three) times daily as needed for congestion.    [provider]  Sodium Chloride, Inhalant, 7 % NEBU Inhale 1  vial into the lungs daily as needed (for wheezing).     [provider]  Tamsulosin HCl (FLOMAX) 0.4 MG CAPS Take 0.4 mg by mouth daily after supper. Hold if SBP < 100    [provider]  traZODone (DESYREL) 150 MG tablet Take 300 mg by mouth at bedtime.    [provider]    Family History Family History  Problem Relation Age of Onset  . Cancer Other        colon  . Heart disease Other        CAD, MI and Heart Attack    Social History Social History  Substance Use Topics  . Smoking status: Never Smoker  . Smokeless tobacco: Never Used  . Alcohol use No     Allergies   Patient has no known allergies.   Review of Systems Review of Systems  Constitutional: Negative for diaphoresis and fever.  Eyes: Negative for redness.  Respiratory: Negative for cough and shortness of breath.   Cardiovascular: Positive for chest pain and leg swelling. Negative for palpitations.  Gastrointestinal: Negative for abdominal pain, nausea and vomiting.  Genitourinary: Negative for dysuria.  Musculoskeletal: Negative for back pain and neck pain.  Skin: Negative for rash.  Neurological: Negative for syncope and light-headedness.  Psychiatric/Behavioral: The patient is not nervous/anxious.      Physical Exam Updated Vital Signs BP 122/78   Pulse 73   Resp 19   SpO2 94%   Physical Exam  Constitutional: He appears well-developed and well-nourished.  HENT:  Head: Normocephalic and atraumatic.  Mouth/Throat: Oropharynx is clear and moist.  Eyes: Conjunctivae are normal. Right eye exhibits no discharge. Left eye exhibits no discharge.  Neck: Normal range of motion. Neck supple.  Cardiovascular: Normal rate, regular rhythm and normal heart sounds.   No murmur heard. Pulmonary/Chest: Effort normal and breath sounds normal. No respiratory distress. He has no wheezes. He has no rales.  Abdominal: Soft. There is no tenderness.  Musculoskeletal: He exhibits edema.    Bilateral LE edema 1-2+, symmetric, no tenderness.  Neurological: He is alert.  Skin: Skin is warm and dry.  Psychiatric: He has a normal mood and affect.  Nursing note and vitals reviewed.    ED Treatments / Results  Labs (all labs ordered are listed, but only abnormal results are displayed) Labs Reviewed  CBC - Abnormal; Notable for the following:       Result Value   RBC  3.47 (*)    Hemoglobin 11.0 (*)    HCT 33.6 (*)    All other components within normal limits  BASIC METABOLIC PANEL - Abnormal; Notable for the following:    Calcium 5.9 (*)    All other components within normal limits  HEPATIC FUNCTION PANEL - Abnormal; Notable for the following:    Albumin 3.4 (*)    ALT 13 (*)    All other components within normal limits  BRAIN NATRIURETIC PEPTIDE  MAGNESIUM  I-STAT TROPOININ, ED  Randolm Idol, ED    ED ECG REPORT   Date: 02/27/2017  Rate: 71  Rhythm: normal sinus rhythm  QRS Axis: normal  Intervals: normal  ST/T Wave abnormalities: normal  Conduction Disutrbances:none  Narrative Interpretation:   Old EKG Reviewed: unchanged  I have personally reviewed the EKG tracing and agree with the computerized printout as noted.  Radiology Dg Chest 2 View  Result Date: 02/27/2017 CLINICAL DATA:  Chest pain. EXAM: CHEST  2 VIEW COMPARISON:  December 22, 2016 FINDINGS: The cardiomediastinal silhouette is stable. Opacity in the right base, suspected to represent atelectasis. No other changes. IMPRESSION: Opacity in the right base, probably atelectasis. Electronically Signed   By: Dorise Bullion III M.D   On: 02/27/2017 17:42    Procedures Procedures (including critical care time)  Medications Ordered in ED Medications - No data to display   Initial Impression / Assessment and Plan / ED Course  I have reviewed the triage vital signs and the nursing notes.  Pertinent labs & imaging results that were available during my care of the patient were reviewed by me and  considered in my medical decision making (see chart for details).     Patient seen and examined. Work-up initiated. Symptoms concerning for unstable angina. Does not present like PE (CT chest 10/10/16 neg for PE in setting of elevated d-dimer, stable aortic ectasia). Does not appear fluid overloaded.   Vital signs reviewed and are as follows: BP 122/78   Pulse 73   Resp 19   SpO2 94%   9:10 PM Patient has been seen by Dr. Eulis Foster. We discussed CP and low calcium. Feels pain is typical and given resolution of sx, neg troponins and unchanged EKG -- can be d/c to home with PCP f/u.   Pt noted to have chronic, worse than baseline, hypocalcemia. Patient updated on this. He is asymptomatic in this regard. Will increase calcium carbonate from twice daily to 4 times daily for the next month. Patient to follow-up for recheck.  Patient was counseled to return with severe chest pain, especially if the pain is crushing or pressure-like and spreads to the arms, back, neck, or jaw, or if they have sweating, nausea, or shortness of breath with the pain. They were encouraged to call 911 with these symptoms.   They were also told to return if their chest pain gets worse and does not go away with rest, they have an attack of chest pain lasting longer than usual despite rest and treatment with the medications their caregiver has prescribed, if they wake from sleep with chest pain or shortness of breath, if they feel dizzy or faint, if they have chest pain not typical of their usual pain, or if they have any other emergent concerns regarding their health.  The patient verbalized understanding and agreed.    Final Clinical Impressions(s) / ED Diagnoses   Final diagnoses:  Precordial pain  Hypocalcemia   CP: Patient with chest pressure, non-exertional. Feel  patient is low risk for ACS given history, negative troponin(s), normal/unchanged EKG. Symptoms are not clinically concerning for PE and patient had rule  out several months ago. No signs of heart failure today. Patient symptomatically during ED stay.  Hypocalcemia: Normal magnesium, slightly low albumin. Unclear etiology however this is likely chronic. Patient does not have any acute symptoms consistent with hypocalcemia. Will treat as above.   New Prescriptions Current Discharge Medication List       Suann Larry 02/27/17 2113    Daleen Bo, MD 03/03/17 [phone]

## 2017-02-27 NOTE — ED Provider Notes (Signed)
Medications - No data to display  Patient Vitals for the past 24 hrs:  SpO2  02/27/17 1627 97 %        Face-to-face evaluation   History: He complains of chest pain for several days feeling "like an elephant sitting on my chest.".  Pain is spontaneously resolved on arrival.  No cough, dyspnea upon exertion, nausea, vomiting, or fever.  Uses oxygen as needed, at his facility.  Physical exam: Alert elderly male, cooperative.  No respiratory distress.  Lungs good air movement bilaterally.  Heart regular rate and rhythm without murmur.  Medical screening examination/treatment/procedure(s) were conducted as a shared visit with non-physician practitioner(s) and myself.  I personally evaluated the patient during the encounter   Daleen Bo, MD 03/03/17 [phone]

## 2017-02-27 NOTE — ED Notes (Signed)
Pt given 324mg  Aspirin with EMS

## 2017-03-22 ENCOUNTER — Inpatient Hospital Stay (HOSPITAL_COMMUNITY)
Admission: EM | Admit: 2017-03-22 | Discharge: 2017-03-25 | DRG: 641 | Disposition: A | Discharge status: Home Health | Payer: Medicare Other | Attending: Internal Medicine | Admitting: Internal Medicine

## 2017-03-22 ENCOUNTER — Emergency Department (HOSPITAL_COMMUNITY): Payer: Medicare Other

## 2017-03-22 ENCOUNTER — Encounter (HOSPITAL_COMMUNITY): Payer: Self-pay

## 2017-03-22 DIAGNOSIS — Z23 Encounter for immunization: Secondary | ICD-10-CM

## 2017-03-22 DIAGNOSIS — I252 Old myocardial infarction: Secondary | ICD-10-CM

## 2017-03-22 DIAGNOSIS — R079 Chest pain, unspecified: Secondary | ICD-10-CM | POA: Diagnosis present

## 2017-03-22 DIAGNOSIS — N401 Enlarged prostate with lower urinary tract symptoms: Secondary | ICD-10-CM | POA: Diagnosis present

## 2017-03-22 DIAGNOSIS — G4700 Insomnia, unspecified: Secondary | ICD-10-CM | POA: Diagnosis present

## 2017-03-22 DIAGNOSIS — I2510 Atherosclerotic heart disease of native coronary artery without angina pectoris: Secondary | ICD-10-CM | POA: Diagnosis not present

## 2017-03-22 DIAGNOSIS — E785 Hyperlipidemia, unspecified: Secondary | ICD-10-CM | POA: Diagnosis present

## 2017-03-22 DIAGNOSIS — E209 Hypoparathyroidism, unspecified: Secondary | ICD-10-CM | POA: Diagnosis not present

## 2017-03-22 DIAGNOSIS — I10 Essential (primary) hypertension: Secondary | ICD-10-CM | POA: Diagnosis not present

## 2017-03-22 DIAGNOSIS — G309 Alzheimer's disease, unspecified: Secondary | ICD-10-CM | POA: Diagnosis present

## 2017-03-22 DIAGNOSIS — F419 Anxiety disorder, unspecified: Secondary | ICD-10-CM | POA: Diagnosis not present

## 2017-03-22 DIAGNOSIS — R338 Other retention of urine: Secondary | ICD-10-CM | POA: Diagnosis present

## 2017-03-22 DIAGNOSIS — Z96653 Presence of artificial knee joint, bilateral: Secondary | ICD-10-CM | POA: Diagnosis present

## 2017-03-22 DIAGNOSIS — E890 Postprocedural hypothyroidism: Secondary | ICD-10-CM | POA: Diagnosis present

## 2017-03-22 DIAGNOSIS — Z7902 Long term (current) use of antithrombotics/antiplatelets: Secondary | ICD-10-CM

## 2017-03-22 DIAGNOSIS — F329 Major depressive disorder, single episode, unspecified: Secondary | ICD-10-CM | POA: Diagnosis present

## 2017-03-22 DIAGNOSIS — E662 Morbid (severe) obesity with alveolar hypoventilation: Secondary | ICD-10-CM | POA: Diagnosis present

## 2017-03-22 DIAGNOSIS — K219 Gastro-esophageal reflux disease without esophagitis: Secondary | ICD-10-CM | POA: Diagnosis not present

## 2017-03-22 DIAGNOSIS — K5909 Other constipation: Secondary | ICD-10-CM | POA: Diagnosis present

## 2017-03-22 DIAGNOSIS — F0280 Dementia in other diseases classified elsewhere without behavioral disturbance: Secondary | ICD-10-CM | POA: Diagnosis present

## 2017-03-22 DIAGNOSIS — R197 Diarrhea, unspecified: Secondary | ICD-10-CM | POA: Diagnosis present

## 2017-03-22 DIAGNOSIS — Z8249 Family history of ischemic heart disease and other diseases of the circulatory system: Secondary | ICD-10-CM

## 2017-03-22 DIAGNOSIS — Z9049 Acquired absence of other specified parts of digestive tract: Secondary | ICD-10-CM

## 2017-03-22 DIAGNOSIS — C649 Malignant neoplasm of unspecified kidney, except renal pelvis: Secondary | ICD-10-CM | POA: Diagnosis present

## 2017-03-22 DIAGNOSIS — I129 Hypertensive chronic kidney disease with stage 1 through stage 4 chronic kidney disease, or unspecified chronic kidney disease: Secondary | ICD-10-CM | POA: Diagnosis present

## 2017-03-22 DIAGNOSIS — R200 Anesthesia of skin: Secondary | ICD-10-CM | POA: Diagnosis not present

## 2017-03-22 DIAGNOSIS — Z800 Family history of malignant neoplasm of digestive organs: Secondary | ICD-10-CM

## 2017-03-22 DIAGNOSIS — J449 Chronic obstructive pulmonary disease, unspecified: Secondary | ICD-10-CM | POA: Diagnosis present

## 2017-03-22 DIAGNOSIS — J9611 Chronic respiratory failure with hypoxia: Secondary | ICD-10-CM | POA: Diagnosis present

## 2017-03-22 DIAGNOSIS — F0390 Unspecified dementia without behavioral disturbance: Secondary | ICD-10-CM | POA: Diagnosis present

## 2017-03-22 DIAGNOSIS — Z85528 Personal history of other malignant neoplasm of kidney: Secondary | ICD-10-CM

## 2017-03-22 DIAGNOSIS — N183 Chronic kidney disease, stage 3 (moderate): Secondary | ICD-10-CM | POA: Diagnosis present

## 2017-03-22 DIAGNOSIS — M1990 Unspecified osteoarthritis, unspecified site: Secondary | ICD-10-CM | POA: Diagnosis present

## 2017-03-22 DIAGNOSIS — Z79899 Other long term (current) drug therapy: Secondary | ICD-10-CM

## 2017-03-22 DIAGNOSIS — Z9981 Dependence on supplemental oxygen: Secondary | ICD-10-CM

## 2017-03-22 LAB — CBC WITH DIFFERENTIAL/PLATELET
Basophils Absolute: 0 10*3/uL (ref 0.0–0.1)
Basophils Relative: 0 %
Eosinophils Absolute: 0.1 10*3/uL (ref 0.0–0.7)
Eosinophils Relative: 1 %
HCT: 35.6 % — ABNORMAL LOW (ref 39.0–52.0)
Hemoglobin: 12 g/dL — ABNORMAL LOW (ref 13.0–17.0)
Lymphocytes Relative: 12 %
Lymphs Abs: 1 10*3/uL (ref 0.7–4.0)
MCH: 32.1 pg (ref 26.0–34.0)
MCHC: 33.7 g/dL (ref 30.0–36.0)
MCV: 95.2 fL (ref 78.0–100.0)
Monocytes Absolute: 0.7 10*3/uL (ref 0.1–1.0)
Monocytes Relative: 8 %
Neutro Abs: 7.2 10*3/uL (ref 1.7–7.7)
Neutrophils Relative %: 79 %
Platelets: 208 10*3/uL (ref 150–400)
RBC: 3.74 MIL/uL — ABNORMAL LOW (ref 4.22–5.81)
RDW: 14.3 % (ref 11.5–15.5)
WBC: 9 10*3/uL (ref 4.0–10.5)

## 2017-03-22 LAB — COMPREHENSIVE METABOLIC PANEL
ALT: 14 U/L — ABNORMAL LOW (ref 17–63)
AST: 29 U/L (ref 15–41)
Albumin: 3.6 g/dL (ref 3.5–5.0)
Alkaline Phosphatase: 89 U/L (ref 38–126)
Anion gap: 11 (ref 5–15)
BUN: 15 mg/dL (ref 6–20)
CO2: 25 mmol/L (ref 22–32)
Calcium: 5.6 mg/dL — CL (ref 8.9–10.3)
Chloride: 104 mmol/L (ref 101–111)
Creatinine, Ser: 1.06 mg/dL (ref 0.61–1.24)
GFR calc Af Amer: >60 mL/min (ref >60)
GFR calc non Af Amer: >60 mL/min (ref >60)
Glucose, Bld: 95 mg/dL (ref 65–99)
Potassium: 3.6 mmol/L (ref 3.5–5.1)
Sodium: 140 mmol/L (ref 135–145)
Total Bilirubin: 0.6 mg/dL (ref 0.3–1.2)
Total Protein: 6.7 g/dL (ref 6.5–8.1)

## 2017-03-22 LAB — BASIC METABOLIC PANEL
Anion gap: 11 (ref 5–15)
BUN: 15 mg/dL (ref 6–20)
CO2: 28 mmol/L (ref 22–32)
Calcium: 5.9 mg/dL — CL (ref 8.9–10.3)
Chloride: 102 mmol/L (ref 101–111)
Creatinine, Ser: 1.08 mg/dL (ref 0.61–1.24)
GFR calc Af Amer: >60 mL/min (ref >60)
GFR calc non Af Amer: >60 mL/min (ref >60)
Glucose, Bld: 96 mg/dL (ref 65–99)
Potassium: 3.5 mmol/L (ref 3.5–5.1)
Sodium: 141 mmol/L (ref 135–145)

## 2017-03-22 LAB — HEPATIC FUNCTION PANEL
ALK PHOS: 77 U/L (ref 38–126)
ALT: 15 U/L — ABNORMAL LOW (ref 17–63)
AST: 28 U/L (ref 15–41)
Albumin: 3.3 g/dL — ABNORMAL LOW (ref 3.5–5.0)
BILIRUBIN INDIRECT: 0.3 mg/dL (ref 0.3–0.9)
Bilirubin, Direct: 0.1 mg/dL (ref 0.1–0.5)
TOTAL PROTEIN: 6.3 g/dL — AB (ref 6.5–8.1)
Total Bilirubin: 0.4 mg/dL (ref 0.3–1.2)

## 2017-03-22 LAB — PHOSPHORUS: Phosphorus: 3.7 mg/dL (ref 2.5–4.6)

## 2017-03-22 LAB — I-STAT TROPONIN, ED: Troponin i, poc: 0.01 ng/mL (ref 0.00–0.08)

## 2017-03-22 LAB — TROPONIN I

## 2017-03-22 LAB — MAGNESIUM: MAGNESIUM: 1.5 mg/dL — AB (ref 1.7–2.4)

## 2017-03-22 MED ORDER — ALBUTEROL SULFATE (2.5 MG/3ML) 0.083% IN NEBU: 2.5000 mg | INHALATION_SOLUTION | RESPIRATORY_TRACT | Status: DC | PRN

## 2017-03-22 MED ORDER — CLOPIDOGREL BISULFATE 75 MG PO TABS
75.0000 mg | ORAL_TABLET | Freq: Every day | ORAL | Status: DC
  Administered 2017-03-23 – 2017-03-25 (×3): 75 mg via ORAL
  Filled 2017-03-22 (×3): qty 1

## 2017-03-22 MED ORDER — ACETAMINOPHEN 650 MG RE SUPP: 650.0000 mg | Freq: Four times a day (QID) | RECTAL | Status: DC | PRN

## 2017-03-22 MED ORDER — CALCIUM CARBONATE-VITAMIN D 500-50 MG-UNIT PO CAPS: 1.0000 | ORAL_CAPSULE | Freq: Four times a day (QID) | ORAL | Status: DC

## 2017-03-22 MED ORDER — TAMSULOSIN HCL 0.4 MG PO CAPS
0.4000 mg | ORAL_CAPSULE | Freq: Every day | ORAL | Status: DC
  Administered 2017-03-22 – 2017-03-25 (×4): 0.4 mg via ORAL
  Filled 2017-03-22 (×4): qty 1

## 2017-03-22 MED ORDER — ZOLPIDEM TARTRATE 5 MG PO TABS: 5.0000 mg | ORAL_TABLET | Freq: Every evening | ORAL | Status: DC | PRN

## 2017-03-22 MED ORDER — OXYMETAZOLINE HCL 0.05 % NA SOLN: 1.0000 | Freq: Two times a day (BID) | NASAL | Status: DC | PRN

## 2017-03-22 MED ORDER — SODIUM CHLORIDE 0.9% FLUSH
3.0000 mL | Freq: Two times a day (BID) | INTRAVENOUS | Status: DC
  Administered 2017-03-22 – 2017-03-25 (×6): 3 mL via INTRAVENOUS

## 2017-03-22 MED ORDER — FLUTICASONE PROPIONATE 50 MCG/ACT NA SUSP
2.0000 | Freq: Every day | NASAL | Status: DC | PRN
  Filled 2017-03-22: qty 16

## 2017-03-22 MED ORDER — METOPROLOL TARTRATE 25 MG PO TABS
12.5000 mg | ORAL_TABLET | Freq: Two times a day (BID) | ORAL | Status: DC
  Administered 2017-03-22 – 2017-03-25 (×6): 12.5 mg via ORAL
  Filled 2017-03-22 (×6): qty 1

## 2017-03-22 MED ORDER — ENOXAPARIN SODIUM 40 MG/0.4ML ~~LOC~~ SOLN
40.0000 mg | Freq: Every day | SUBCUTANEOUS | Status: DC
  Administered 2017-03-22 – 2017-03-24 (×3): 40 mg via SUBCUTANEOUS
  Filled 2017-03-22 (×3): qty 0.4

## 2017-03-22 MED ORDER — ACETAMINOPHEN 325 MG PO TABS
650.0000 mg | ORAL_TABLET | Freq: Four times a day (QID) | ORAL | Status: DC | PRN
  Administered 2017-03-23: 650 mg via ORAL
  Filled 2017-03-22: qty 2

## 2017-03-22 MED ORDER — CITALOPRAM HYDROBROMIDE 10 MG PO TABS
10.0000 mg | ORAL_TABLET | Freq: Every day | ORAL | Status: DC
Start: 2017-03-23 — End: 2017-03-25
  Administered 2017-03-23 – 2017-03-25 (×3): 10 mg via ORAL
  Filled 2017-03-22 (×3): qty 1

## 2017-03-22 MED ORDER — LORAZEPAM 0.5 MG PO TABS
0.5000 mg | ORAL_TABLET | Freq: Two times a day (BID) | ORAL | Status: DC | PRN
  Administered 2017-03-23: 0.5 mg via ORAL
  Filled 2017-03-22: qty 1

## 2017-03-22 MED ORDER — MAGNESIUM SULFATE 2 GM/50ML IV SOLN
2.0000 g | Freq: Once | INTRAVENOUS | Status: AC
  Administered 2017-03-22: 2 g via INTRAVENOUS
  Filled 2017-03-22: qty 50

## 2017-03-22 MED ORDER — DICLOFENAC SODIUM 1 % TD GEL: 2.0000 g | Freq: Two times a day (BID) | TRANSDERMAL | Status: DC | PRN

## 2017-03-22 MED ORDER — CALCIUM CARBONATE-VITAMIN D 500-200 MG-UNIT PO TABS
1.0000 | ORAL_TABLET | Freq: Three times a day (TID) | ORAL | Status: DC
  Administered 2017-03-22: 22:00:00 1 via ORAL
  Filled 2017-03-22: qty 1

## 2017-03-22 MED ORDER — POLYVINYL ALCOHOL 1.4 % OP SOLN
1.0000 [drp] | Freq: Two times a day (BID) | OPHTHALMIC | Status: DC
  Administered 2017-03-22 – 2017-03-25 (×6): 1 [drp] via OPHTHALMIC
  Filled 2017-03-22: qty 15

## 2017-03-22 MED ORDER — NYSTATIN 100000 UNIT/GM EX CREA: 1.0000 "application " | TOPICAL_CREAM | Freq: Two times a day (BID) | CUTANEOUS | Status: DC | PRN

## 2017-03-22 MED ORDER — CALCIUM CARBONATE-VITAMIN D 500-200 MG-UNIT PO TABS
2.0000 | ORAL_TABLET | Freq: Three times a day (TID) | ORAL | Status: DC
  Administered 2017-03-23 – 2017-03-25 (×11): 2 via ORAL
  Filled 2017-03-22: qty 2
  Filled 2017-03-22: qty 1
  Filled 2017-03-22 (×9): qty 2

## 2017-03-22 MED ORDER — LORAZEPAM 1 MG PO TABS
1.0000 mg | ORAL_TABLET | Freq: Once | ORAL | Status: AC
  Administered 2017-03-22: 1 mg via ORAL
  Filled 2017-03-22: qty 1

## 2017-03-22 MED ORDER — METHOCARBAMOL 500 MG PO TABS: 1000.0000 mg | ORAL_TABLET | Freq: Three times a day (TID) | ORAL | Status: DC | PRN

## 2017-03-22 MED ORDER — ONDANSETRON HCL 4 MG/2ML IJ SOLN: 4.0000 mg | Freq: Four times a day (QID) | INTRAMUSCULAR | Status: DC | PRN

## 2017-03-22 MED ORDER — HYDROCODONE-ACETAMINOPHEN 5-325 MG PO TABS
1.0000 | ORAL_TABLET | Freq: Two times a day (BID) | ORAL | Status: DC
Start: 2017-03-22 — End: 2017-03-25
  Administered 2017-03-22 – 2017-03-25 (×6): 1 via ORAL
  Filled 2017-03-22 (×6): qty 1

## 2017-03-22 MED ORDER — TRAZODONE HCL 100 MG PO TABS
300.0000 mg | ORAL_TABLET | Freq: Every day | ORAL | Status: DC
  Administered 2017-03-22 – 2017-03-24 (×3): 300 mg via ORAL
  Filled 2017-03-22 (×4): qty 3

## 2017-03-22 MED ORDER — LORATADINE 10 MG PO TABS
10.0000 mg | ORAL_TABLET | Freq: Every day | ORAL | Status: DC
  Administered 2017-03-23 – 2017-03-25 (×3): 10 mg via ORAL
  Filled 2017-03-22 (×3): qty 1

## 2017-03-22 MED ORDER — NITROGLYCERIN 0.4 MG SL SUBL
0.4000 mg | SUBLINGUAL_TABLET | SUBLINGUAL | Status: DC | PRN
Start: 2017-03-22 — End: 2017-03-25

## 2017-03-22 MED ORDER — SODIUM CHLORIDE 0.9 % IV SOLN: 1.0000 g | Freq: Once | INTRAVENOUS | Status: DC

## 2017-03-22 MED ORDER — MAGNESIUM CITRATE PO SOLN: 1.0000 | Freq: Every day | ORAL | Status: DC | PRN

## 2017-03-22 MED ORDER — LEVOTHYROXINE SODIUM 25 MCG PO TABS
25.0000 ug | ORAL_TABLET | Freq: Every day | ORAL | Status: DC
  Administered 2017-03-23 – 2017-03-25 (×3): 25 ug via ORAL
  Filled 2017-03-22 (×3): qty 1

## 2017-03-22 MED ORDER — IPRATROPIUM BROMIDE 0.02 % IN SOLN
0.5000 mg | Freq: Two times a day (BID) | RESPIRATORY_TRACT | Status: DC
  Administered 2017-03-22 – 2017-03-23 (×2): 0.5 mg via RESPIRATORY_TRACT
  Filled 2017-03-22 (×2): qty 2.5

## 2017-03-22 MED ORDER — MOMETASONE FURO-FORMOTEROL FUM 100-5 MCG/ACT IN AERO
2.0000 | INHALATION_SPRAY | Freq: Two times a day (BID) | RESPIRATORY_TRACT | Status: DC
  Administered 2017-03-22 – 2017-03-25 (×6): 2 via RESPIRATORY_TRACT
  Filled 2017-03-22: qty 8.8

## 2017-03-22 MED ORDER — SODIUM CHLORIDE 0.9 % IV SOLN
1.0000 g | Freq: Once | INTRAVENOUS | Status: AC
  Administered 2017-03-22: 1 g via INTRAVENOUS
  Filled 2017-03-22: qty 10

## 2017-03-22 MED ORDER — LEVOTHYROXINE SODIUM 100 MCG PO TABS
200.0000 ug | ORAL_TABLET | Freq: Every day | ORAL | Status: DC
  Administered 2017-03-23 – 2017-03-25 (×3): 200 ug via ORAL
  Filled 2017-03-22 (×3): qty 2

## 2017-03-22 MED ORDER — SODIUM CHLORIDE 3 % IN NEBU
4.0000 mL | INHALATION_SOLUTION | Freq: Every day | RESPIRATORY_TRACT | Status: DC | PRN
Start: 2017-03-22 — End: 2017-03-25

## 2017-03-22 MED ORDER — CALCIUM GLUCONATE 10 % IV SOLN
1.0000 g | Freq: Once | INTRAVENOUS | Status: AC
  Administered 2017-03-22: 1 g via INTRAVENOUS
  Filled 2017-03-22: qty 10

## 2017-03-22 MED ORDER — CALCIUM CARBONATE ANTACID 500 MG PO CHEW: 2.0000 | CHEWABLE_TABLET | Freq: Every day | ORAL | Status: DC | PRN

## 2017-03-22 MED ORDER — FAMOTIDINE 20 MG PO TABS
20.0000 mg | ORAL_TABLET | Freq: Every day | ORAL | Status: DC
  Administered 2017-03-22 – 2017-03-24 (×3): 20 mg via ORAL
  Filled 2017-03-22 (×3): qty 1

## 2017-03-22 MED ORDER — SALINE SPRAY 0.65 % NA SOLN: 2.0000 | Freq: Three times a day (TID) | NASAL | Status: DC | PRN

## 2017-03-22 MED ORDER — ALPRAZOLAM 0.25 MG PO TABS
0.2500 mg | ORAL_TABLET | Freq: Two times a day (BID) | ORAL | Status: DC | PRN
  Administered 2017-03-24 – 2017-03-25 (×2): 0.25 mg via ORAL
  Filled 2017-03-22 (×2): qty 1

## 2017-03-22 MED ORDER — GUAIFENESIN-DM 100-10 MG/5ML PO SYRP: 10.0000 mL | ORAL_SOLUTION | Freq: Two times a day (BID) | ORAL | Status: DC | PRN

## 2017-03-22 MED ORDER — CALCIUM GLUCONATE 10 % IV SOLN: 1.0000 g | Freq: Once | INTRAVENOUS | Status: DC

## 2017-03-22 MED ORDER — MORPHINE SULFATE (PF) 2 MG/ML IV SOLN: 2.0000 mg | INTRAVENOUS | Status: DC | PRN

## 2017-03-22 MED ORDER — PANTOPRAZOLE SODIUM 20 MG PO TBEC
20.0000 mg | DELAYED_RELEASE_TABLET | Freq: Every day | ORAL | Status: DC
  Administered 2017-03-22 – 2017-03-25 (×4): 20 mg via ORAL
  Filled 2017-03-22 (×4): qty 1

## 2017-03-22 MED ORDER — CYCLOSPORINE 0.05 % OP EMUL
1.0000 [drp] | Freq: Two times a day (BID) | OPHTHALMIC | Status: DC
  Administered 2017-03-22 – 2017-03-25 (×6): 1 [drp] via OPHTHALMIC
  Filled 2017-03-22 (×7): qty 1

## 2017-03-22 MED ORDER — ONDANSETRON HCL 4 MG PO TABS: 4.0000 mg | ORAL_TABLET | Freq: Four times a day (QID) | ORAL | Status: DC | PRN

## 2017-03-22 MED ORDER — MELATONIN 3 MG PO TABS: 3.0000 mg | ORAL_TABLET | Freq: Every evening | ORAL | Status: DC | PRN

## 2017-03-22 MED ORDER — CYANOCOBALAMIN 500 MCG PO TABS
500.0000 ug | ORAL_TABLET | Freq: Every day | ORAL | Status: DC
  Administered 2017-03-22 – 2017-03-25 (×4): 500 ug via ORAL
  Filled 2017-03-22 (×4): qty 1

## 2017-03-22 NOTE — ED Notes (Signed)
Bed: WA08 Expected date:  Expected time:  Means of arrival:  Comments: TR 9

## 2017-03-22 NOTE — Progress Notes (Signed)
PHARMACIST - PHYSICIAN ORDER COMMUNICATION  CONCERNING: P&T Medication Policy on Herbal Medications  DESCRIPTION:  This patient's order for:  Melatonin  has been noted.  This product(s) is classified as an "herbal" or natural product. Due to a lack of definitive safety studies or FDA approval, nonstandard manufacturing practices, plus the potential risk of unknown drug-drug interactions while on inpatient medications, the Pharmacy and Therapeutics Committee does not permit the use of "herbal" or natural products of this type within [facility].   ACTION TAKEN: The pharmacy department is unable to verify this order at this time and your patient has been informed of this safety policy. Please reevaluate patient's clinical condition at discharge and address if the herbal or natural product(s) should be resumed at that time.  Netta Cedars, PharmD, BCPS Pager: [phone] 03/22/2017@8 :44 PM

## 2017-03-22 NOTE — ED Triage Notes (Signed)
Per EMS, pt from [facility].  Pt c/o anxiety and tingling all over x 2 days.  resp in 30's.  Pt wears home oxygen at 2l per [place].  sats 98%.  Vitals:  110/70, hr 78, resp 30,cbg 104

## 2017-03-22 NOTE — H&P (Addendum)
History and Physical    [name] [name] DOB: September 03, 1941 DOA: 03/22/2017  Referring MD/NP/PA:   PCP: Lesia Hausen, PA   Patient coming from:  The patient is coming from home.  At baseline, pt is independent for most of ADL.  Chief Complaint: tingling and numbness  HPI: [name] [name] is a 76 y.o. male with medical history significant of hypertension, hyperlipidemia, COPD on 2 L oxygen at home, GERD, hypothyroidism, s/p of thyroidectomy, anxiety, OSA on CPAP, CAD, myocardial infarction, dementia, renal carcinoma, arthritis, BPH, hypoparathyroidism, who presents with a tingling and numbness.  Patient states that he he has been having numbness and tingling in bilateral upper and lower extremities, and also in perioral area for more than two day. No unilateral weakness, slurred speech, facial droop. Patient reports diffuse body aches. He also has mild chest pain, which is located in the frontal lower chest, constant, nonradiating. Patient states that he has mild cough and SOB due to COPD, which is at her baseline. Patient states that he has diarrhea in the past 2 days. He has had one loose bowel movement today, but patient isn on MiraLAX and senokot at home. Patient denies symptoms of UTI, nausea, vomiting, abdominal pain.  ED Course: pt was found to have calcium 5.6 (LFT not done yet, no albumin data available yet), phosphorus is 3.7, magnesium 1.5, potassium 3.5, creatinine normal, WBC 9.0, negative troponin, temperature 99.7, no tachycardia, oxygen saturation 99% on room air, negative chest x-ray for acute abnormalities. Patient is placed on telemetry bed for observation.  Review of Systems:   General: no fevers, chills, no changes in body weight, has fatigue.  HEENT: no blurry vision, hearing changes or sore throat Respiratory: has dyspnea, coughing, no wheezing CV: has chest pain, no palpitations GI: no nausea, vomiting, abdominal pain, diarrhea, constipation GU: no dysuria,  burning on urination, increased urinary frequency, hematuria  Ext: no leg edema Neuro: no unilateral weakness, no vision change or hearing loss. Has tingling and numbness Skin: no rash, no skin tear. MSK: No muscle spasm, no deformity, no limitation of range of movement in spin Heme: No easy bruising.  Travel history: No recent long distant travel.  Allergy: No Known Allergies  Past Medical History:  Diagnosis Date  . Allergic rhinitis, cause unspecified   . Arthritis    osteoarthritis  . Cancer ([place])    renal carcinoma  . COPD (chronic obstructive pulmonary disease) (HCC)    [name] FOLLOWS PT--PT USING OXYGEN 24 HRS A DAY  . COPD (chronic obstructive pulmonary disease) (HCC)    PULMONARY CLEARANCE FOR LEFT RENAL CRYOABLATION ON CHART FROM DR. CLANCE -07/26/11  . Coronary artery disease    CARDIAC CLEARANCE  OFFICE NOTE FOR LEFT CRYO ABLATION ON CHART FROM [name] -"RECENT STRESS TEST DID NOT SHOW ISCHEMIA" -"PRESUMED CAD DUE TO CORONARY CALCIFICATIONS"   . DEMENTIA    Alzheimer's, senile dementia  . Family history of ischemic heart disease   . Family history of malignant neoplasm of gastrointestinal tract   . GERD (gastroesophageal reflux disease)   . Hypothyroidism   . Myocardial infarction ([place])   . Obesity hypoventilation syndrome ([place])    failed cpap/bipap trial  . Other and unspecified hyperlipidemia   . Other congenital hamartoses, not elsewhere classified   . Pneumonia    HX     . Shortness of breath   . Sleep apnea    OXYGEN 24 HR/DAY-NO CPAP  . Unspecified essential hypertension  Past Surgical History:  Procedure Laterality Date  . CARPAL TUNNEL RELEASE     right  . CHOLECYSTECTOMY  2009  . EYE SURGERY     bilateral cataracts  . JOINT REPLACEMENT     bilateral knee replacements  . KNEE ARTHROPLASTY     2005 LEFT   AND 2007 RIGHT--[name]  . THYROIDECTOMY    . TUMOR REMOVAL  09/14/2011    Social History:  reports that he has never  smoked. He has never used smokeless tobacco. He reports that he does not drink alcohol or use drugs.  Family History:  Family History  Problem Relation Age of Onset  . Cancer Other        colon  . Heart disease Other        CAD, MI and Heart Attack     Prior to Admission medications   Medication Sig Start Date End Date Taking? Authorizing Provider  albuterol (PROVENTIL) (2.5 MG/3ML) 0.083% nebulizer solution Take 2.5 mg by nebulization 2 (two) times daily. Reported on 03/13/2016    [provider]  ALPRAZolam Duanne Moron) 0.25 MG tablet Take 0.25 mg by mouth every 12 (twelve) hours as needed for anxiety.    [provider]  calcium carbonate (TUMS) 500 MG chewable tablet Chew 2 tablets by mouth daily as needed for indigestion or heartburn.    [provider]  Calcium Carbonate-Vitamin D (CALCIUM PLUS VITAMIN D) 500-50 MG-UNIT CAPS Take 1 tablet by mouth 4 (four) times daily. 02/27/17   [name] Cater, PA-C  carboxymethylcellulose 1 % ophthalmic solution Place 1 drop into both eyes 2 (two) times daily.    [provider]  cetirizine (ZYRTEC) 10 MG tablet Take 10 mg by mouth daily.    [provider]  clopidogrel (PLAVIX) 75 MG tablet Take 75 mg by mouth every morning.     [provider]  cyanocobalamin 500 MCG tablet Take 500 mcg by mouth daily.    [provider]  cycloSPORINE (RESTASIS) 0.05 % ophthalmic emulsion Place 1 drop into both eyes 2 (two) times daily.    [provider]  Dextromethorphan-Guaifenesin (DIABETIC TUSSIN MAX ST PO) Take 5 mLs by mouth 2 (two) times daily as needed (cough).    [provider]  diclofenac sodium (VOLTAREN) 1 % GEL Apply 2 g topically 2 (two) times daily as needed (pain).     [provider]  fluticasone (FLONASE) 50 MCG/ACT nasal spray Place 2 sprays into the nose daily as needed for allergies.     [provider]  HYDROcodone-acetaminophen (NORCO/VICODIN) 5-325  MG tablet Take 1 tablet by mouth every 6 (six) hours as needed for moderate pain. Patient taking differently: Take 1 tablet by mouth 2 (two) times daily.  02/27/16   Domenic Polite, MD  ipratropium (ATROVENT) 0.02 % nebulizer solution Take 0.5 mg by nebulization 2 (two) times daily.     [provider]  levofloxacin (LEVAQUIN) 500 MG tablet Take 1 tablet (500 mg total) by mouth daily. 11/05/16   Jola Schmidt, MD  levothyroxine (SYNTHROID, LEVOTHROID) 200 MCG tablet Take 200 mcg by mouth daily before breakfast.    [provider]  levothyroxine (SYNTHROID, LEVOTHROID) 25 MCG tablet Take 25 mcg by mouth daily before breakfast.    [provider]  magnesium citrate SOLN Take 1 Bottle by mouth daily as needed for moderate constipation or severe constipation.    [provider]  Melatonin 3 MG TABS Take 3 mg by mouth at bedtime  as needed.    [provider]  methocarbamol (ROBAXIN) 500 MG tablet Take 2 tablets (1,000 mg total) by mouth 3 (three) times daily between meals as needed. 10/24/16   Tanna Furry, MD  metoprolol tartrate (LOPRESSOR) 25 MG tablet Take 12.5 mg by mouth 2 (two) times daily. Hold for systolic blood pressure <270    [provider]  mometasone-formoterol (DULERA) 100-5 MCG/ACT AERO Inhale 2 puffs into the lungs 2 (two) times daily.    [provider]  nystatin cream (MYCOSTATIN) Apply 1 application topically 2 (two) times daily as needed for dry skin.    [provider]  OXYGEN Inhale into the lungs at bedtime.    [provider]  oxymetazoline (AFRIN) 0.05 % nasal spray Place 1 spray into both nostrils 2 (two) times daily as needed for congestion.    [provider]  pantoprazole (PROTONIX) 20 MG tablet Take 1 tablet (20 mg total) by mouth daily. 06/24/16   Lacretia Leigh, MD  polyethylene glycol powder (MIRALAX) powder Take 17 g by mouth 2 (two) times daily. 11/05/16   Jola Schmidt, MD    sennosides-docusate sodium (SENOKOT-S) 8.6-50 MG tablet Take 2 tablets by mouth at bedtime.    [provider]  sodium chloride (OCEAN) 0.65 % SOLN nasal spray Place 2 sprays into both nostrils 3 (three) times daily as needed for congestion.    [provider]  Sodium Chloride, Inhalant, 7 % NEBU Inhale 1 vial into the lungs daily as needed (for wheezing).     [provider]  Tamsulosin HCl (FLOMAX) 0.4 MG CAPS Take 0.4 mg by mouth daily after supper. Hold if SBP < 100    [provider]  traZODone (DESYREL) 150 MG tablet Take 300 mg by mouth at bedtime.    [provider]    Physical Exam: Vitals:   03/22/17 1153 03/22/17 1556 03/22/17 1835 03/22/17 2122  BP: 132/70 118/69 121/67 131/64  Pulse: 77 72 80 71  Resp: (!) 36 20 18 18   Temp: 99.7 F (37.6 C) 98.7 F (37.1 C) 99 F (37.2 C)   TempSrc: Oral Oral Oral   SpO2: 99% 100% 100% 99%  Weight:    110 kg (242 lb 8.1 oz)  Height:    6\' 4"  (1.93 m)   General: Not in acute distress HEENT:       Eyes: PERRL, EOMI, no scleral icterus.       ENT: No discharge from the ears and nose, no pharynx injection, no tonsillar enlargement.        Neck: No JVD, no bruit, no mass felt. Heme: No neck lymph node enlargement. Cardiac: S1/S2, RRR, No murmurs, No gallops or rubs. Respiratory: No rales, wheezing, rhonchi or rubs. GI: Soft, nondistended, nontender, no rebound pain, no organomegaly, BS present. GU: No hematuria Ext: No pitting leg edema bilaterally. 2+DP/PT pulse bilaterally. Musculoskeletal: No joint deformities, No joint redness or warmth, no limitation of ROM in spin. Skin: No rashes.  Neuro: Alert, oriented X3, cranial nerves II-XII grossly intact, moves all extremities normally. Muscle strength 5/5 in all extremities, sensation to light touch intact. Brachial reflex 2+ bilaterally. Negative Babinski's sign. Psych: Patient is not psychotic, no suicidal or hemocidal ideation.  Labs on  Admission: I have personally reviewed following labs and imaging studies  CBC:  Recent Labs Lab 03/22/17 1451  WBC 9.0  NEUTROABS 7.2  HGB 12.0*  HCT 35.6*  MCV 95.2  PLT 350   Basic Metabolic Panel:  Recent Labs Lab 03/22/17 1451 03/22/17 1624 03/22/17 1736  NA 140  --  141  K 3.6  --  3.5  CL 104  --  102  CO2 25  --  28  GLUCOSE 95  --  96  BUN 15  --  15  CREATININE 1.06  --  1.08  CALCIUM 5.6*  --  5.9*  MG  --  1.5*  --   PHOS  --  3.7  --    GFR: Estimated Creatinine Clearance: 77.9 mL/min (by C-G formula based on SCr of 1.08 mg/dL). Liver Function Tests:  Recent Labs Lab 03/22/17 1451 03/22/17 2047  AST 29 28  ALT 14* 15*  ALKPHOS 89 77  BILITOT 0.6 0.4  PROT 6.7 6.3*  ALBUMIN 3.6 3.3*   No results for input(s): LIPASE, AMYLASE in the last 168 hours. No results for input(s): AMMONIA in the last 168 hours. Coagulation Profile: No results for input(s): INR, PROTIME in the last 168 hours. Cardiac Enzymes:  Recent Labs Lab 03/22/17 2047  TROPONINI <0.03   BNP (last 3 results)  Recent Labs  10/07/16 1105  PROBNP 72.0   HbA1C: No results for input(s): HGBA1C in the last 72 hours. CBG: No results for input(s): GLUCAP in the last 168 hours. Lipid Profile: No results for input(s): CHOL, HDL, LDLCALC, TRIG, CHOLHDL, LDLDIRECT in the last 72 hours. Thyroid Function Tests: No results for input(s): TSH, T4TOTAL, FREET4, T3FREE, THYROIDAB in the last 72 hours. Anemia Panel: No results for input(s): VITAMINB12, FOLATE, FERRITIN, TIBC, IRON, RETICCTPCT in the last 72 hours. Urine analysis:    Component Value Date/Time   COLORURINE YELLOW 06/24/2016 1406   APPEARANCEUR CLOUDY (A) 06/24/2016 1406   LABSPEC 1.023 06/24/2016 1406   PHURINE 8.5 (H) 06/24/2016 1406   GLUCOSEU NEGATIVE 06/24/2016 1406   HGBUR SMALL (A) 06/24/2016 1406   BILIRUBINUR NEGATIVE 06/24/2016 1406   KETONESUR NEGATIVE 06/24/2016 1406   PROTEINUR NEGATIVE 06/24/2016 1406     UROBILINOGEN 0.2 07/10/2015 1630   NITRITE NEGATIVE 06/24/2016 1406   LEUKOCYTESUR TRACE (A) 06/24/2016 1406   Sepsis Labs: @LABRCNTIP (procalcitonin:4,lacticidven:4) )No results found for this or any previous visit (from the past 240 hour(s)).   Radiological Exams on Admission: Dg Chest 2 View  Result Date: 03/22/2017 CLINICAL DATA:  Bilateral extremity numbness for the past 2 days. History of COPD with O2 dependence. EXAM: CHEST  2 VIEW COMPARISON:  02/27/2017; 11/05/2016; 10/10/2016; chest CT - 10/10/2016 FINDINGS: Grossly unchanged cardiac silhouette and mediastinal contours. Bibasilar heterogeneous opacities are unchanged in favored to represent atelectasis. No new focal airspace opacities. Interposed colon is noted below the right hemidiaphragm. No pleural effusion or pneumothorax. No evidence of edema. No acute osseus abnormalities. Post right total shoulder replacement, incompletely evaluated. Moderate degenerative change of the left glenohumeral joint, incompletely evaluated. IMPRESSION: No acute cardiopulmonary disease. Electronically Signed   By: Sandi Mariscal M.D.   On: 03/22/2017 15:12     EKG: Independently reviewed.  Sinus rhythm, QTC 496, anteroseptal infarction pattern, ST segment prolongation  Assessment/Plan Principal Problem:   Hypocalcemia Active Problems:   Hypoparathyroidism (HCC)   Dementia   COPD (chronic obstructive pulmonary disease) (HCC)   Coronary artery disease   HTN (hypertension)   GERD (gastroesophageal reflux disease)   Anxiety   Hypomagnesemia   Chest pain   Hypocalcemia and Hypomagnesemia:  pt's paresthesia is most likely caused by hypocalcemia and hypomagnesemia. Calcium 5.6 (LFT not done yet, no albumin data available yet), phosphorus 3.7, magnesium 1.5,  potassium 3.5. Etiology is not clear. Patient has history of hypoparathyroidism, which likely to be the reason.  -will place to tele bed for obs -Give 1 g calcium gluconate 2 by IV - give 2 g  of magnesium sulfate IV -check PTH, ionized Ca, LFT for albumin level, V1,25 level, urine Ca level -increased home [place] tab from 1 to 2 tabs tid  -continue home calcium carbonate -F/u by BMP  Addendum: the repeated BMP at 00:54 showed Ca is 6.0 -will give 2g calcium gluconate now -repeat BMP and Mg in AM  Hx of Hypoparathyroidism ([place]): -f/u PTH level  .GERD: -Protonix -Pepcid  COPD: stable -dulera inhaler -prn albuterol nenbs.  Hx of Coronary artery disease and chest pain: Patient has mild chest pain, may be due to muscle spasm secondary to hypocalcemia, but given his significant risk factors (hypertension, old age, CAD, myocardial infarction), will need to rule out ACS. - cycle CE q6 x3 and repeat EKG in the am  - prn Nitroglycerin, Morphine, and plavix and metoprolol - Risk factor stratification: will check FLP and A1C  - 2d echo  Diarrhea: Likely due to MiraLAX and senokot use -Hold her MiraLAX and senokot  HTN (hypertension) -continue metoprolol   Anxiety -continue home prn Xanax     DVT ppx: SQ Lovenox Code Status: Full code Family Communication: None at bed side.   Disposition Plan:  Anticipate discharge back to previous home environment Consults called:  none Admission status: Obs / tele    Date of Service 03/22/2017    Ivor Costa [facility] Pager [phone]  If 7PM-7AM, please contact night-coverage [email or web address] Password [email or web address] 03/22/2017, 10:13 PM

## 2017-03-22 NOTE — ED Provider Notes (Signed)
[facility] DEPT Provider Note   CSN: [id number] Arrival date & time: 03/22/17  1130  By signing my name below, I, [name] [name], attest that this documentation has been prepared under the direction and in the presence of [employer], PA-C.  Electronically Signed: Theresia [name], ED Scribe. 03/22/17. 1:01 PM.  History   Chief Complaint Chief Complaint  Patient presents with  . Numbness   The history is provided by the patient. No language interpreter was used.   HPI Comments: [name] [name] is a 76 y.o. male who presents to the Emergency Department complaining of moderate, gradual onset numbness in his bilateral upper and lower extremities onset 2 days ago. Pt describes the numbness at as a tingling sensation.Patient also reports diffuse body aches. No hx of similar symptoms. Pt reports associated anxiety. No treatments tried prior to arrival in the ED. No other complaints at this time.  Past Medical History:  Diagnosis Date  . Allergic rhinitis, cause unspecified   . Arthritis    osteoarthritis  . Cancer ([place])    renal carcinoma  . COPD (chronic obstructive pulmonary disease) (HCC)    [name] FOLLOWS PT--PT USING OXYGEN 24 HRS A DAY  . COPD (chronic obstructive pulmonary disease) (HCC)    PULMONARY CLEARANCE FOR LEFT RENAL CRYOABLATION ON CHART FROM DR. CLANCE -07/26/11  . Coronary artery disease    CARDIAC CLEARANCE  OFFICE NOTE FOR LEFT CRYO ABLATION ON CHART FROM [name] -"RECENT STRESS TEST DID NOT SHOW ISCHEMIA" -"PRESUMED CAD DUE TO CORONARY CALCIFICATIONS"   . DEMENTIA    Alzheimer's, senile dementia  . Family history of ischemic heart disease   . Family history of malignant neoplasm of gastrointestinal tract   . GERD (gastroesophageal reflux disease)   . Hypothyroidism   . Myocardial infarction ([place])   . Obesity hypoventilation syndrome ([place])    failed cpap/bipap trial  . Other and unspecified hyperlipidemia   . Other congenital hamartoses, not  elsewhere classified   . Pneumonia    HX     . Shortness of breath   . Sleep apnea    OXYGEN 24 HR/DAY-NO CPAP  . Unspecified essential hypertension     Patient Active Problem List   Diagnosis Date Noted  . GERD (gastroesophageal reflux disease) 03/22/2017  . Anxiety 03/22/2017  . Hypomagnesemia 03/22/2017  . Chest pain 03/22/2017  . Musculoskeletal chest pain   . Bronchitis 10/10/2016  . CKD (chronic kidney disease) stage 3, GFR 30-59 ml/min 10/10/2016  . HTN (hypertension) 10/10/2016  . Hypothyroid 10/10/2016  . Chronic diastolic CHF (congestive heart failure) ([place]) 03/03/2016  . Coronary artery disease 03/03/2016  . Thoracic aortic aneurysm ([place]) 03/03/2016  . Dementia 02/23/2016  . COPD (chronic obstructive pulmonary disease) ([place]) 02/23/2016  . Oliguria 02/23/2016  . Atypical chest pain 02/23/2016  . Chronic respiratory failure with hypoxia and hypercapnia ([place]) 09/27/2015  . Morbid obesity ([place]) 07/13/2015  . Dyspnea on exertion 07/12/2015  . OSA (obstructive sleep apnea) 11/15/2011  . Renal mass, left 09/14/2011  . DISORDERS OF DIAPHRAGM 07/02/2010  . OBESITY HYPOVENTILATION SYNDROME 02/14/2009  . Multiple pulmonary nodules 02/14/2009  . CHOLELITHIASIS 05/04/2008  . Nonspecific (abnormal) findings on radiological and other examination of body structure 04/28/2008  . ABNORMAL CHEST XRAY 04/28/2008  . 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 Hypocalcemia 03/31/2008  . 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 DYSLIPIDEMIA 09/21/2007  . Hypertensive heart disease with CHF (congestive heart failure) ([place]) 09/21/2007  . Allergic  rhinitis 09/21/2007    Past Surgical History:  Procedure Laterality Date  . CARPAL TUNNEL RELEASE     right  . CHOLECYSTECTOMY  2009  . EYE SURGERY     bilateral cataracts  . JOINT REPLACEMENT     bilateral knee replacements  . KNEE ARTHROPLASTY     2005 LEFT   AND 2007 RIGHT--[name]    . THYROIDECTOMY    . TUMOR REMOVAL  09/14/2011       Home Medications    Prior to Admission medications   Medication Sig Start Date End Date Taking? Authorizing Provider  calcium carbonate (TUMS) 500 MG chewable tablet Chew 2 tablets by mouth daily as needed for indigestion or heartburn.   Yes [provider]  LORazepam (ATIVAN) 1 MG tablet Take 1 mg by mouth at bedtime.   Yes [provider]  Melatonin 3 MG TABS Take 3 mg by mouth at bedtime as needed.   Yes [provider]  metoprolol tartrate (LOPRESSOR) 25 MG tablet Take 12.5 mg by mouth 2 (two) times daily. Hold for systolic blood pressure <295   Yes [provider]  traZODone (DESYREL) 150 MG tablet Take 300 mg by mouth at bedtime.   Yes [provider]  albuterol (PROVENTIL) (2.5 MG/3ML) 0.083% nebulizer solution Take 2.5 mg by nebulization 2 (two) times daily. Reported on 03/13/2016    [provider]  ALPRAZolam Duanne Moron) 0.25 MG tablet Take 0.25 mg by mouth every 12 (twelve) hours as needed for anxiety.    [provider]  Calcium Carbonate-Vitamin D (CALCIUM PLUS VITAMIN D) 500-50 MG-UNIT CAPS Take 1 tablet by mouth 4 (four) times daily. 02/27/17   Carlisle Cater, PA-C  carboxymethylcellulose 1 % ophthalmic solution Place 1 drop into both eyes 2 (two) times daily.    [provider]  cetirizine (ZYRTEC) 10 MG tablet Take 10 mg by mouth daily.    [provider]  clopidogrel (PLAVIX) 75 MG tablet Take 75 mg by mouth every morning.     [provider]  cyanocobalamin 500 MCG tablet Take 500 mcg by mouth daily.    [provider]  cycloSPORINE (RESTASIS) 0.05 % ophthalmic emulsion Place 1 drop into both eyes 2 (two) times daily.    [provider]  Dextromethorphan-Guaifenesin (DIABETIC TUSSIN MAX ST PO) Take 5 mLs by mouth 2 (two) times daily as needed (cough).    [provider]  diclofenac sodium (VOLTAREN) 1 % GEL  Apply 2 g topically 2 (two) times daily as needed (pain).     [provider]  fluticasone (FLONASE) 50 MCG/ACT nasal spray Place 2 sprays into the nose daily as needed for allergies.     [provider]  HYDROcodone-acetaminophen (NORCO/VICODIN) 5-325 MG tablet Take 1 tablet by mouth every 6 (six) hours as needed for moderate pain. Patient taking differently: Take 1 tablet by mouth 2 (two) times daily.  02/27/16   Domenic Polite, MD  ipratropium (ATROVENT) 0.02 % nebulizer solution Take 0.5 mg by nebulization 2 (two) times daily.     [provider]  levofloxacin (LEVAQUIN) 500 MG tablet Take 1 tablet (500 mg total) by mouth daily. 11/05/16   Jola Schmidt, MD  levothyroxine (SYNTHROID, LEVOTHROID) 200 MCG tablet Take 200 mcg by mouth daily before breakfast.    [provider]  levothyroxine (SYNTHROID, LEVOTHROID) 25 MCG tablet Take 25 mcg by mouth daily before breakfast.    [provider]  magnesium citrate SOLN Take 1 Bottle  by mouth daily as needed for moderate constipation or severe constipation.    [provider]  methocarbamol (ROBAXIN) 500 MG tablet Take 2 tablets (1,000 mg total) by mouth 3 (three) times daily between meals as needed. 10/24/16   Tanna Furry, MD  mometasone-formoterol [facility]) 100-5 MCG/ACT AERO Inhale 2 puffs into the lungs 2 (two) times daily.    [provider]  nystatin cream (MYCOSTATIN) Apply 1 application topically 2 (two) times daily as needed for dry skin.    [provider]  OXYGEN Inhale into the lungs at bedtime.    [provider]  oxymetazoline (AFRIN) 0.05 % nasal spray Place 1 spray into both nostrils 2 (two) times daily as needed for congestion.    [provider]  pantoprazole (PROTONIX) 20 MG tablet Take 1 tablet (20 mg total) by mouth daily. 06/24/16   Lacretia Leigh, MD  polyethylene glycol powder (MIRALAX) powder Take 17 g by mouth 2 (two) times daily. 11/05/16   Jola Schmidt, MD  sennosides-docusate sodium (SENOKOT-S) 8.6-50 MG tablet Take 2 tablets by mouth at bedtime.    [provider]  sodium chloride (OCEAN) 0.65 % SOLN nasal spray Place 2 sprays into both nostrils 3 (three) times daily as needed for congestion.    [provider]  Sodium Chloride, Inhalant, 7 % NEBU Inhale 1 vial into the lungs daily as needed (for wheezing).     [provider]  Tamsulosin HCl (FLOMAX) 0.4 MG CAPS Take 0.4 mg by mouth daily after supper. Hold if SBP < 100    [provider]    Family History Family History  Problem Relation Age of Onset  . Cancer Other        colon  . Heart disease Other        CAD, MI and Heart Attack    Social History Social History  Substance Use Topics  . Smoking status: Never Smoker  . Smokeless tobacco: Never Used  . Alcohol use No     Allergies   Patient has no known allergies.   Review of Systems Review of Systems  Constitutional: Negative for fever.  Neurological: Positive for numbness.  All other systems reviewed and are negative.    Physical Exam Updated Vital Signs BP 121/67 (BP Location: Right Arm)   Pulse 80   Temp 99 F (37.2 C) (Oral)   Resp 18   SpO2 100%   Physical Exam  Constitutional: He is oriented to person, place, and time. He appears well-developed and well-nourished.  HENT:  Head: Normocephalic and atraumatic.  Cardiovascular: Normal rate.   Pulmonary/Chest: Effort normal and breath sounds normal. No respiratory distress. He has no wheezes.  Musculoskeletal:  Diffuse tenderness to upper and lower extremities chest back and abdomen- no signs of tetany  Neurological: He is alert and oriented to person, place, and time.  Skin: Skin is warm and dry.  Psychiatric: He has a normal mood and affect.  Nursing note and vitals reviewed.    ED Treatments / Results  DIAGNOSTIC STUDIES: Oxygen Saturation is 99% on 2L/min [place], normal by my interpretation.   5:29  PM Consulted with an internal medicine specialist, Dr. Elon Jester who requested a repeat BNP    Labs (all labs ordered are listed, but only abnormal results are displayed) Labs Reviewed  CBC WITH DIFFERENTIAL/PLATELET - Abnormal; Notable for the following:       Result Value   RBC 3.74 (*)    Hemoglobin 12.0 (*)  HCT 35.6 (*)    All other components within normal limits  COMPREHENSIVE METABOLIC PANEL - Abnormal; Notable for the following:    Calcium 5.6 (*)    ALT 14 (*)    All other components within normal limits  MAGNESIUM - Abnormal; Notable for the following:    Magnesium 1.5 (*)    All other components within normal limits  BASIC METABOLIC PANEL - Abnormal; Notable for the following:    Calcium 5.9 (*)    All other components within normal limits  PHOSPHORUS  PARATHYROID HORMONE, INTACT (NO CA)  VITAMIN D 25 HYDROXY (VIT D DEFICIENCY, FRACTURES)  CALCITRIOL (1,25 DI-OH VIT D)  CALCIUM, IONIZED  CALCIUM, URINE, RANDOM  HEMOGLOBIN A1C  LIPID PANEL  TROPONIN I  TROPONIN I  TROPONIN I  HEPATIC FUNCTION PANEL  BASIC METABOLIC PANEL  CBC  I-STAT TROPOININ, ED    EKG  EKG Interpretation  Date/Time:  Saturday March 22 2017 15:54:05 EDT Ventricular Rate:  76 PR Interval:    QRS Duration: 93 QT Interval:  441 QTC Calculation: 496 R Axis:   -29 Text Interpretation:  Sinus rhythm Borderline left axis deviation Low voltage, precordial leads Artifact T wave abnormality Abnormal ekg Confirmed by Carmin Muskrat [id number]) on 03/22/2017 5:31:24 PM       Radiology Dg Chest 2 View  Result Date: 03/22/2017 CLINICAL DATA:  Bilateral extremity numbness for the past 2 days. History of COPD with O2 dependence. EXAM: CHEST  2 VIEW COMPARISON:  02/27/2017; 11/05/2016; 10/10/2016; chest CT - 10/10/2016 FINDINGS: Grossly unchanged cardiac silhouette and mediastinal contours. Bibasilar heterogeneous opacities are unchanged in favored to represent atelectasis. No new focal airspace  opacities. Interposed colon is noted below the right hemidiaphragm. No pleural effusion or pneumothorax. No evidence of edema. No acute osseus abnormalities. Post right total shoulder replacement, incompletely evaluated. Moderate degenerative change of the left glenohumeral joint, incompletely evaluated. IMPRESSION: No acute cardiopulmonary disease. Electronically Signed   By: Sandi Mariscal M.D.   On: 03/22/2017 15:12    Procedures Procedures (including critical care time)  Medications Ordered in ED Medications  calcium gluconate 1 g in sodium chloride 0.9 % 100 mL IVPB (not administered)  Calcium Carbonate-Vitamin D 500-50 MG-UNIT CAPS 1 tablet (not administered)  magnesium citrate solution 1 Bottle (not administered)  Melatonin TABS 3 mg (not administered)  methocarbamol (ROBAXIN) tablet 1,000 mg (not administered)  ALPRAZolam (XANAX) tablet 0.25 mg (not administered)  calcium carbonate (TUMS - dosed in mg elemental calcium) chewable tablet 400 mg of elemental calcium (not administered)  carboxymethylcellulose 1 % ophthalmic solution 1 drop (not administered)  Dextromethorphan-Guaifenesin 10-200 MG/5ML LIQD (not administered)  nystatin cream (MYCOSTATIN) 1 application (not administered)  oxymetazoline (AFRIN) 0.05 % nasal spray 1 spray (not administered)  sodium chloride (OCEAN) 0.65 % nasal spray 2 spray (not administered)  loratadine (CLARITIN) tablet 10 mg (not administered)  cycloSPORINE (RESTASIS) 0.05 % ophthalmic emulsion 1 drop (not administered)  diclofenac sodium (VOLTAREN) 1 % transdermal gel 2 g (not administered)  Sodium Chloride (Inhalant) 7 % NEBU 1 vial (not administered)  pantoprazole (PROTONIX) EC tablet 20 mg (not administered)  HYDROcodone-acetaminophen (NORCO/VICODIN) 5-325 MG per tablet 1 tablet (not administered)  levothyroxine (SYNTHROID, LEVOTHROID) tablet 25 mcg (not administered)  ipratropium (ATROVENT) nebulizer solution 0.5 mg (not administered)    mometasone-formoterol (DULERA) 100-5 MCG/ACT inhaler 2 puff (not administered)  albuterol (PROVENTIL) (2.5 MG/3ML) 0.083% nebulizer solution 2.5 mg (not administered)  cyanocobalamin tablet 500 mcg (not administered)  traZODone (DESYREL) tablet 300  mg (not administered)  tamsulosin (FLOMAX) capsule 0.4 mg (not administered)  fluticasone (FLONASE) 50 MCG/ACT nasal spray 2 spray (not administered)  clopidogrel (PLAVIX) tablet 75 mg (not administered)  metoprolol tartrate (LOPRESSOR) tablet 12.5 mg (not administered)  magnesium sulfate IVPB 2 g 50 mL (not administered)  enoxaparin (LOVENOX) injection 40 mg (not administered)  sodium chloride flush (NS) 0.9 % injection 3 mL (not administered)  acetaminophen (TYLENOL) tablet 650 mg (not administered)    Or  acetaminophen (TYLENOL) suppository 650 mg (not administered)  ondansetron (ZOFRAN) tablet 4 mg (not administered)    Or  ondansetron (ZOFRAN) injection 4 mg (not administered)  zolpidem (AMBIEN) tablet 5 mg (not administered)  LORazepam (ATIVAN) tablet 1 mg (1 mg Oral Given 03/22/17 1309)  calcium gluconate 1 g in sodium chloride 0.9 % 100 mL IVPB (1 g Intravenous New Bag/Given 03/22/17 1652)     Initial Impression / Assessment and Plan / ED Course  I have reviewed the triage vital signs and the nursing notes.  Pertinent labs & imaging results that were available during my care of the patient were reviewed by me and considered in my medical decision making (see chart for details).    Labs: CMP, CBC with Differential, I-Stat Troponin, Vitamin D 25 Hydroxy, Phosphorus, Magnesium, and Parathyroid hormone.  Imaging: DG Chest 2 View, EKG  Consults:   Therapeutics:   Discharge Meds:  Assessment/Plan: 76 year old male presents today with paresthesias, body aches. She has a history of hypocalcemia, but reports he has not been taking his calcium gluconate at home. After further discussion he reports 4 days of worsening symptoms. Patient's  calcium 5.9 here in the ED, he has slightly prolonged QTC at 596. Patient has progressive worsening of his calcium over the last 6 months.  [facility] hospitalist service was consult at, I spoke with Dr. Carmie Kanner who felt that this calcium level was not correct and a lab air. He did not feel this patient would require hospital admission and that he could receive calcium as an outpatient. I informed him that patient is symptomatic from low calcium, also has prolongation of his QTC. He requested that I repeat calcium and then consult back.   Repeat calcium shows a level of 5.9 after 2 g of calcium, patient is still symptomatic. I spoke with [facility] Dr.  Tomie China who agreed that patient would need admission for ongoing management. Patient has remained stable while here in the ED.    Final Clinical Impressions(s) / ED Diagnoses   Final diagnoses:  Hypocalcemia    New Prescriptions New Prescriptions   No medications on file   I personally performed the services described in this documentation, which was scribed in my presence. The recorded information has been reviewed and is accurate.    Okey Regal, PA-C 03/22/17 2011    Duffy Bruce, MD 03/23/17 [phone]

## 2017-03-22 NOTE — ED Notes (Signed)
Date and time results received: 03/22/17 3:26 PM  (use smartphrase ".now" to insert current time)  Test: Ca++ Critical Value: 5.6  Name of Provider Notified: Donnetta Hail  Orders Received? Or Actions Taken?:

## 2017-03-22 NOTE — Progress Notes (Signed)
Pt refused CPAP qhs.  Pt states that he does not wear it at home and does not wish to wear it here.  Education provided.  Pt encouraged to contact RT if he changes his mind.

## 2017-03-23 DIAGNOSIS — R079 Chest pain, unspecified: Secondary | ICD-10-CM

## 2017-03-23 DIAGNOSIS — K219 Gastro-esophageal reflux disease without esophagitis: Secondary | ICD-10-CM | POA: Diagnosis present

## 2017-03-23 DIAGNOSIS — J410 Simple chronic bronchitis: Secondary | ICD-10-CM

## 2017-03-23 DIAGNOSIS — J449 Chronic obstructive pulmonary disease, unspecified: Secondary | ICD-10-CM | POA: Diagnosis present

## 2017-03-23 DIAGNOSIS — I10 Essential (primary) hypertension: Secondary | ICD-10-CM | POA: Diagnosis not present

## 2017-03-23 DIAGNOSIS — E662 Morbid (severe) obesity with alveolar hypoventilation: Secondary | ICD-10-CM | POA: Diagnosis present

## 2017-03-23 DIAGNOSIS — F419 Anxiety disorder, unspecified: Secondary | ICD-10-CM | POA: Diagnosis present

## 2017-03-23 DIAGNOSIS — F0280 Dementia in other diseases classified elsewhere without behavioral disturbance: Secondary | ICD-10-CM | POA: Diagnosis present

## 2017-03-23 DIAGNOSIS — I129 Hypertensive chronic kidney disease with stage 1 through stage 4 chronic kidney disease, or unspecified chronic kidney disease: Secondary | ICD-10-CM | POA: Diagnosis present

## 2017-03-23 DIAGNOSIS — R200 Anesthesia of skin: Secondary | ICD-10-CM | POA: Diagnosis present

## 2017-03-23 DIAGNOSIS — G309 Alzheimer's disease, unspecified: Secondary | ICD-10-CM | POA: Diagnosis present

## 2017-03-23 DIAGNOSIS — R197 Diarrhea, unspecified: Secondary | ICD-10-CM | POA: Diagnosis present

## 2017-03-23 DIAGNOSIS — I2510 Atherosclerotic heart disease of native coronary artery without angina pectoris: Secondary | ICD-10-CM | POA: Diagnosis present

## 2017-03-23 DIAGNOSIS — E209 Hypoparathyroidism, unspecified: Secondary | ICD-10-CM | POA: Diagnosis present

## 2017-03-23 DIAGNOSIS — C649 Malignant neoplasm of unspecified kidney, except renal pelvis: Secondary | ICD-10-CM | POA: Diagnosis present

## 2017-03-23 DIAGNOSIS — G4700 Insomnia, unspecified: Secondary | ICD-10-CM | POA: Diagnosis present

## 2017-03-23 DIAGNOSIS — J9611 Chronic respiratory failure with hypoxia: Secondary | ICD-10-CM | POA: Diagnosis present

## 2017-03-23 DIAGNOSIS — Z9981 Dependence on supplemental oxygen: Secondary | ICD-10-CM | POA: Diagnosis not present

## 2017-03-23 DIAGNOSIS — N183 Chronic kidney disease, stage 3 (moderate): Secondary | ICD-10-CM | POA: Diagnosis present

## 2017-03-23 DIAGNOSIS — F329 Major depressive disorder, single episode, unspecified: Secondary | ICD-10-CM | POA: Diagnosis present

## 2017-03-23 DIAGNOSIS — N401 Enlarged prostate with lower urinary tract symptoms: Secondary | ICD-10-CM | POA: Diagnosis present

## 2017-03-23 DIAGNOSIS — E785 Hyperlipidemia, unspecified: Secondary | ICD-10-CM | POA: Diagnosis present

## 2017-03-23 DIAGNOSIS — Z23 Encounter for immunization: Secondary | ICD-10-CM | POA: Diagnosis present

## 2017-03-23 DIAGNOSIS — R338 Other retention of urine: Secondary | ICD-10-CM | POA: Diagnosis present

## 2017-03-23 DIAGNOSIS — K5909 Other constipation: Secondary | ICD-10-CM | POA: Diagnosis present

## 2017-03-23 LAB — BASIC METABOLIC PANEL
ANION GAP: 11 (ref 5–15)
Anion gap: 11 (ref 5–15)
Anion gap: 9 (ref 5–15)
BUN: 19 mg/dL (ref 6–20)
BUN: 20 mg/dL (ref 6–20)
BUN: 21 mg/dL — ABNORMAL HIGH (ref 6–20)
CALCIUM: 6.1 mg/dL — AB (ref 8.9–10.3)
CALCIUM: 6 mg/dL — AB (ref 8.9–10.3)
CO2: 29 mmol/L (ref 22–32)
CO2: 28 mmol/L (ref 22–32)
CO2: 29 mmol/L (ref 22–32)
CREATININE: 1.17 mg/dL (ref 0.61–1.24)
Calcium: 6 mg/dL — CL (ref 8.9–10.3)
Chloride: 101 mmol/L (ref 101–111)
Chloride: 102 mmol/L (ref 101–111)
Chloride: 104 mmol/L (ref 101–111)
Creatinine, Ser: 1.16 mg/dL (ref 0.61–1.24)
Creatinine, Ser: 1.06 mg/dL (ref 0.61–1.24)
GFR calc Af Amer: >60 mL/min (ref >60)
GFR calc non Af Amer: 59 mL/min — ABNORMAL LOW (ref >60)
GFR, EST NON AFRICAN AMERICAN: 58 mL/min — AB (ref >60)
GLUCOSE: 92 mg/dL (ref 65–99)
Glucose, Bld: 124 mg/dL — ABNORMAL HIGH (ref 65–99)
Glucose, Bld: 128 mg/dL — ABNORMAL HIGH (ref 65–99)
POTASSIUM: 3.5 mmol/L (ref 3.5–5.1)
Potassium: 3.5 mmol/L (ref 3.5–5.1)
Potassium: 4.1 mmol/L (ref 3.5–5.1)
SODIUM: 141 mmol/L (ref 135–145)
SODIUM: 142 mmol/L (ref 135–145)
Sodium: 141 mmol/L (ref 135–145)

## 2017-03-23 LAB — PARATHYROID HORMONE, INTACT (NO CA): PTH: 7 pg/mL — ABNORMAL LOW (ref 15–65)

## 2017-03-23 LAB — TROPONIN I: Troponin I: <0.03 ng/mL (ref <0.03)

## 2017-03-23 LAB — CBC
HCT: 36.7 % — ABNORMAL LOW (ref 39.0–52.0)
HEMOGLOBIN: 12.2 g/dL — AB (ref 13.0–17.0)
MCH: 32.1 pg (ref 26.0–34.0)
MCHC: 33.2 g/dL (ref 30.0–36.0)
MCV: 96.6 fL (ref 78.0–100.0)
PLATELETS: 205 10*3/uL (ref 150–400)
RBC: 3.8 MIL/uL — AB (ref 4.22–5.81)
RDW: 14.6 % (ref 11.5–15.5)
WBC: 8.1 10*3/uL (ref 4.0–10.5)

## 2017-03-23 LAB — LIPID PANEL
Cholesterol: 130 mg/dL (ref 0–200)
HDL: 40 mg/dL — AB (ref >40)
LDL Cholesterol: 51 mg/dL (ref 0–99)
TRIGLYCERIDES: 194 mg/dL — AB (ref <150)
Total CHOL/HDL Ratio: 3.3 RATIO
VLDL: 39 mg/dL (ref 0–40)

## 2017-03-23 LAB — MAGNESIUM: MAGNESIUM: 2 mg/dL (ref 1.7–2.4)

## 2017-03-23 LAB — MRSA PCR SCREENING: MRSA by PCR: NEGATIVE

## 2017-03-23 MED ORDER — SODIUM CHLORIDE 0.9 % IV SOLN
2.0000 g | Freq: Once | INTRAVENOUS | Status: AC
  Administered 2017-03-23: 2 g via INTRAVENOUS
  Filled 2017-03-23: qty 20

## 2017-03-23 MED ORDER — IPRATROPIUM-ALBUTEROL 0.5-2.5 (3) MG/3ML IN SOLN
3.0000 mL | Freq: Two times a day (BID) | RESPIRATORY_TRACT | Status: DC
  Administered 2017-03-23 – 2017-03-25 (×4): 3 mL via RESPIRATORY_TRACT
  Filled 2017-03-23 (×5): qty 3

## 2017-03-23 MED ORDER — SODIUM CHLORIDE 0.9 % IV SOLN
1.0000 g | Freq: Once | INTRAVENOUS | Status: DC
  Filled 2017-03-23: qty 10

## 2017-03-23 MED ORDER — IPRATROPIUM-ALBUTEROL 0.5-2.5 (3) MG/3ML IN SOLN: 3.0000 mL | Freq: Four times a day (QID) | RESPIRATORY_TRACT | Status: DC

## 2017-03-23 MED ORDER — PNEUMOCOCCAL VAC POLYVALENT 25 MCG/0.5ML IJ INJ
0.5000 mL | INJECTION | INTRAMUSCULAR | Status: AC
  Administered 2017-03-24: 0.5 mL via INTRAMUSCULAR
  Filled 2017-03-23: qty 0.5

## 2017-03-23 MED ORDER — CALCITRIOL 0.25 MCG PO CAPS
0.2500 ug | ORAL_CAPSULE | Freq: Every day | ORAL | Status: DC
  Administered 2017-03-23 – 2017-03-25 (×3): 0.25 ug via ORAL
  Filled 2017-03-23 (×3): qty 1

## 2017-03-23 MED ORDER — SODIUM CHLORIDE 0.9 % IV SOLN
2.0000 g | Freq: Two times a day (BID) | INTRAVENOUS | Status: AC
  Administered 2017-03-23 (×2): 2 g via INTRAVENOUS
  Filled 2017-03-23 (×2): qty 20

## 2017-03-23 NOTE — Progress Notes (Signed)
[facility] PROGRESS NOTE  [name] [name] LDJ:[id number] DOB: [birth year] DOA: 03/22/2017 PCP: Lesia Hausen, PA  Interim summary and history of present illness 76 y.o. male with medical history significant of hypertension, hyperlipidemia, COPD on 2 L oxygen at home, GERD, hypothyroidism, s/p of thyroidectomy, anxiety, OSA on CPAP, CAD, myocardial infarction, dementia, renal carcinoma, arthritis, BPH, hypoparathyroidism, who presents with a tingling and numbness. Patient found with severe hypocalcemia and hypomagnesemia. Admitted for electrolyte repletion and to rule out ACS given his complains of chest discomfort.  Assessment/Plan: 1-hypocalcemia and hypomagnesemia: Symptomatic -Calcium still low -Magnesium repleted -Phosphorus within normal limits. -Will follow vitamin D 1,25 level and also TSH.  -Continue aggressive repletion of calcium  -Adjustment to his all skull medication and also initiation of calcitriol has been done  -Will follow electrolytes  -Continue monitoring on telemetry   2-history of hypo-parathyroidism -Will follow PTH  3-history of GERD  Will continue Protonix and continue Pepcid  4-COPD: -A stable and without wheezing on exam -Will continue home inhaler regimen   5-history of coronary artery disease -No acute ischemic changes on EKG or telemetry -Troponin negative -Patient chest discomfort most likely associated with musculoskeletal pain due to hypocalcemia -Will continue Plavix and metoprolol  6-hypothyroidism -Will check TSH -Will continue Synthroid  7-history of anxiety -Will continue as needed Ativan  8-depression and insomnia -Continue Celexa, continue Remeron and the use of melatonin  9-BPH -Will continue Flomax -Patient require an episode of in and out cath after admission due to transient urinary retention   10-diarrhea  -Appears to be secondary to medication  -Will hold Lasix at this point now -Monitor symptoms.  Code Status:  Full code Family Communication: No family at bedside Disposition Plan: Patient will remain in the hospital, continue electrolytes repletion aggressively, continue telemetry monitoring. Will continue IV fluid resuscitations and follow clinical response.   Consultants:  None  Procedures:  See below for x-ray reports  Antibiotics:  None  HPI/Subjective: Afebrile, patient expresses no shortness of breath. Still complaining of generalized tingling/numbness and cramping muscular pain.  Objective: Vitals:   03/23/17 0534 03/23/17 1519  BP: 130/73 (!) 112/55  Pulse: 64 71  Resp: 20 18  Temp: 98 F (36.7 C) 99 F (37.2 C)    Intake/Output Summary (Last 24 hours) at 03/23/17 1832 Last data filed at 03/23/17 1512  Gross per 24 hour  Intake              960 ml  Output             1325 ml  Net             -365 ml   Filed Weights   03/22/17 2122  Weight: 110 kg (242 lb 8.1 oz)    Exam:   General:  Afebrile, complaining of tingling/numbness and associated muscular cramping in his whole body. No nausea, no vomiting, no diarrhea. Overnight with transient episode of urinary retention that requires in and out cath.  Cardiovascular: S1, S2, no rubs, no gallops, no JVD  Respiratory: Wearing his chronic nasal cannula supplementation, no using accessory muscles, no wheezing, no crackles.   Abdomen: Soft, positive bowel sounds, vague diffuse tenderness; no guarding.  Musculoskeletal: . Hoss is in place, no cyanosis, no clubbing; full range of motion no joint abnormality appreciated.   Data Reviewed: Basic Metabolic Panel:  Recent Labs Lab 03/22/17 1451 03/22/17 1624 03/22/17 1736 03/23/17 0054 03/23/17 0735  NA 140  --  141 142  141 141  K 3.6  --  3.5 3.5  3.5 4.1  CL 104  --  102 102  101 104  CO2 25  --  28 29  29 28   GLUCOSE 95  --  96 128*  124* 92  BUN 15  --  15 19  20  21*  CREATININE 1.06  --  1.08 1.16  1.17 1.06  CALCIUM 5.6*  --  5.9* 6.0*  6.0*  6.1*  MG  --  1.5*  --   --  2.0  PHOS  --  3.7  --   --   --    Liver Function Tests:  Recent Labs Lab 03/22/17 1451 03/22/17 2047  AST 29 28  ALT 14* 15*  ALKPHOS 89 77  BILITOT 0.6 0.4  PROT 6.7 6.3*  ALBUMIN 3.6 3.3*   CBC:  Recent Labs Lab 03/22/17 1451 03/23/17 0054  WBC 9.0 8.1  NEUTROABS 7.2  --   HGB 12.0* 12.2*  HCT 35.6* 36.7*  MCV 95.2 96.6  PLT 208 205   Cardiac Enzymes:  Recent Labs Lab 03/22/17 2047 03/23/17 0054 03/23/17 0735  TROPONINI <0.03 <0.03 <0.03   BNP (last 3 results)  Recent Labs  10/10/16 1209 12/22/16 0128 02/27/17 1651  BNP 48.0 24.8 27.6    ProBNP (last 3 results)  Recent Labs  10/07/16 1105  PROBNP 72.0    CBG: No results for input(s): GLUCAP in the last 168 hours.  Recent Results (from the past 240 hour(s))  MRSA PCR Screening     Status: None   Collection Time: 03/22/17  8:51 PM  Result Value Ref Range Status   MRSA by PCR NEGATIVE NEGATIVE Final    Comment:        The GeneXpert MRSA Assay (FDA approved for NASAL specimens only), is one component of a comprehensive MRSA colonization surveillance program. It is not intended to diagnose MRSA infection nor to guide or monitor treatment for MRSA infections.      Studies: Dg Chest 2 View  Result Date: 03/22/2017 CLINICAL DATA:  Bilateral extremity numbness for the past 2 days. History of COPD with O2 dependence. EXAM: CHEST  2 VIEW COMPARISON:  02/27/2017; 11/05/2016; 10/10/2016; chest CT - 10/10/2016 FINDINGS: Grossly unchanged cardiac silhouette and mediastinal contours. Bibasilar heterogeneous opacities are unchanged in favored to represent atelectasis. No new focal airspace opacities. Interposed colon is noted below the right hemidiaphragm. No pleural effusion or pneumothorax. No evidence of edema. No acute osseus abnormalities. Post right total shoulder replacement, incompletely evaluated. Moderate degenerative change of the left glenohumeral joint,  incompletely evaluated. IMPRESSION: No acute cardiopulmonary disease. Electronically Signed   By: Sandi Mariscal M.D.   On: 03/22/2017 15:12    Scheduled Meds: . calcitRIOL  0.25 mcg Oral Daily  . calcium-vitamin D  2 tablet Oral TID AC & HS  . citalopram  10 mg Oral Daily  . clopidogrel  75 mg Oral QAC breakfast  . cyanocobalamin  500 mcg Oral Daily  . cycloSPORINE  1 drop Both Eyes BID  . enoxaparin (LOVENOX) injection  40 mg Subcutaneous QHS  . famotidine  20 mg Oral QHS  . HYDROcodone-acetaminophen  1 tablet Oral BID  . ipratropium-albuterol  3 mL Nebulization BID  . levothyroxine  200 mcg Oral QAC breakfast  . levothyroxine  25 mcg Oral QAC breakfast  . loratadine  10 mg Oral Daily  . metoprolol tartrate  12.5 mg Oral BID  . mometasone-formoterol  2 puff Inhalation BID  .  pantoprazole  20 mg Oral Daily  . [START ON 03/24/2017] pneumococcal 23 valent vaccine  0.5 mL Intramuscular Tomorrow-1000  . polyvinyl alcohol  1 drop Both Eyes BID  . sodium chloride flush  3 mL Intravenous Q12H  . tamsulosin  0.4 mg Oral QPC supper  . traZODone  300 mg Oral QHS   Continuous Infusions: . calcium gluconate Stopped (03/23/17 1329)    Principal Problem:   Hypocalcemia Active Problems:   Hypoparathyroidism (HCC)   Dementia   COPD (chronic obstructive pulmonary disease) (HCC)   Coronary artery disease   HTN (hypertension)   GERD (gastroesophageal reflux disease)   Anxiety   Hypomagnesemia   Chest pain    Time spent: 25 minutes    Barton Dubois  [facility] Pager [phone]. If 7PM-7AM, please contact night-coverage at [email or web address], password [facility] 03/23/2017, 6:32 PM  LOS: 0 days

## 2017-03-23 NOTE — Progress Notes (Signed)
CRITICAL VALUE ALERT  Critical Value:  Ca 6.0  Date & Time Notied:  03/23/17 0213  Provider Notified: Georges Mouse  Orders Received/Actions taken:

## 2017-03-23 NOTE — Progress Notes (Signed)
Pt refused CPAP qhs.  Pt states that he does not wear it at home and does not wish to wear it here.  Education provided.  Pt encouraged to contact RT if he changes his mind.

## 2017-03-23 NOTE — Progress Notes (Signed)
CRITICAL VALUE ALERT  Critical Value:  Ca=6.1  Date & Time Notied:  03/23/17 2883  Provider Notified: [place]  Orders Received/Actions taken: Yes

## 2017-03-24 DIAGNOSIS — J9611 Chronic respiratory failure with hypoxia: Secondary | ICD-10-CM

## 2017-03-24 DIAGNOSIS — K219 Gastro-esophageal reflux disease without esophagitis: Secondary | ICD-10-CM

## 2017-03-24 DIAGNOSIS — I10 Essential (primary) hypertension: Secondary | ICD-10-CM

## 2017-03-24 LAB — BASIC METABOLIC PANEL
Anion gap: 8 (ref 5–15)
Anion gap: 7 (ref 5–15)
BUN: 19 mg/dL (ref 6–20)
BUN: 19 mg/dL (ref 6–20)
CHLORIDE: 104 mmol/L (ref 101–111)
CO2: 27 mmol/L (ref 22–32)
CO2: 29 mmol/L (ref 22–32)
CREATININE: 1.01 mg/dL (ref 0.61–1.24)
Calcium: 6.7 mg/dL — ABNORMAL LOW (ref 8.9–10.3)
Calcium: 7.1 mg/dL — ABNORMAL LOW (ref 8.9–10.3)
Chloride: 103 mmol/L (ref 101–111)
Creatinine, Ser: 1.02 mg/dL (ref 0.61–1.24)
GFR calc Af Amer: >60 mL/min (ref >60)
GFR calc Af Amer: >60 mL/min (ref >60)
GLUCOSE: 101 mg/dL — AB (ref 65–99)
Glucose, Bld: 124 mg/dL — ABNORMAL HIGH (ref 65–99)
POTASSIUM: 4.3 mmol/L (ref 3.5–5.1)
Potassium: 3.7 mmol/L (ref 3.5–5.1)
SODIUM: 139 mmol/L (ref 135–145)
Sodium: 139 mmol/L (ref 135–145)

## 2017-03-24 LAB — HEMOGLOBIN A1C
Hgb A1c MFr Bld: 6 % — ABNORMAL HIGH (ref 4.8–5.6)
Mean Plasma Glucose: 126 mg/dL

## 2017-03-24 LAB — CALCIUM, IONIZED: CALCIUM, IONIZED, SERUM: 3.1 mg/dL — AB (ref 4.5–5.6)

## 2017-03-24 LAB — TSH: TSH: 3.377 u[IU]/mL (ref 0.350–4.500)

## 2017-03-24 LAB — VITAMIN D 25 HYDROXY (VIT D DEFICIENCY, FRACTURES): Vit D, 25-Hydroxy: 41.9 ng/mL (ref 30.0–100.0)

## 2017-03-24 LAB — CALCITRIOL (1,25 DI-OH VIT D): Vit D, 1,25-Dihydroxy: 27.9 pg/mL (ref 19.9–79.3)

## 2017-03-24 LAB — CALCIUM, URINE, RANDOM: Calcium, Ur: 3.1 mg/dL

## 2017-03-24 MED ORDER — CALCIUM GLUCONATE 10 % IV SOLN
2.0000 g | Freq: Once | INTRAVENOUS | Status: AC
  Administered 2017-03-24: 2 g via INTRAVENOUS
  Filled 2017-03-24: qty 20

## 2017-03-24 MED ORDER — SODIUM CHLORIDE 0.9 % IV SOLN
2.0000 g | Freq: Once | INTRAVENOUS | Status: AC
  Administered 2017-03-24: 2 g via INTRAVENOUS
  Filled 2017-03-24: qty 20

## 2017-03-24 NOTE — Progress Notes (Signed)
[facility] PROGRESS NOTE  [name] [name] ZSW:[id number] DOB: July 08, 1941 DOA: 03/22/2017 PCP: Lesia Hausen, PA  Interim summary and history of present illness 76 y.o. male with medical history significant of hypertension, hyperlipidemia, COPD on 2 L oxygen at home, GERD, hypothyroidism, s/p of thyroidectomy, anxiety, OSA on CPAP, CAD, myocardial infarction, dementia, renal carcinoma, arthritis, BPH, hypoparathyroidism, who presents with a tingling and numbness. Patient found with severe hypocalcemia and hypomagnesemia. Admitted for electrolyte repletion and to rule out ACS given his complains of chest discomfort.  Assessment/Plan: 1-hypocalcemia and hypomagnesemia: Symptomatic -Calcium even improved, still low.  -Magnesium and phosphorus within normal limits -TSH within normal limits -Will continue aggressive calcium repletion and has made adjustment to his maintenance supplementations -Continue monitoring on telemetry -If a stable will discharge home tomorrow morning (03/25/2017)  2-history of hypo-parathyroidism -PTH 7 -will benefit of outpatient follow up after discharge -will continue calcium supplementation   3-history of GERD  -Continue PPI and Pepcid  4-COPD: With chronic respiratory failure, oxygen dependent. -Overall is stable. -Will continue oxygen supplementation -Continue Combivent inhaler regimen -No wheezing and no using accessory muscle.   5-history of coronary artery disease -No acute ischemic changes on EKG or telemetry -Troponin negative 3 -Patient denies any further chest discomfort at this moment. -History of pain most likely associated with musculoskeletal cramping from electrolytes disorder. -Will continue Plavix and metoprolol.  6-hypothyroidism -TSH within normal limits -Will continue Synthroid at current dose  7-history of anxiety -Continue as needed Ativan   8-depression and insomnia -Stable overall.  -No suicidal ideation or  hallucination  -Continue Celexa, continue with amiodarone and continue melatonin.   9-BPH -will continue flomax -no further complaints of urinary retention -no dyauria   10-diarrhea  -No further episodes of diarrhea  -Most likely associated with the use of Laxatives -will monitor and resume use of PRN miralax; patient with chronic constipation from hypothyroidism   Code Status: Full code Family Communication: No family at bedside Disposition Plan: Will keep patient overnight for further improvement of his symptoms. Continue electrolytes supplementation. Will continue telemetry monitoring   Consultants:  None  Procedures:  See below for x-ray reports  Antibiotics:  None  HPI/Subjective: Afebrile, no chest pain, no shortness of breath. Patient reports still being significant symptomatic about tingling, numbness and crampy muscular pain. Denies nausea, vomiting and dysuria. Chronically with nasal cannula supplementation.  Objective: Vitals:   03/24/17 0409 03/24/17 1423  BP: 117/67 136/68  Pulse: 60 60  Resp: 19 19  Temp: 98 F (36.7 C) 98.2 F (36.8 C)    Intake/Output Summary (Last 24 hours) at 03/24/17 1908 Last data filed at 03/24/17 1901  Gross per 24 hour  Intake             1200 ml  Output             1502 ml  Net             -302 ml   Filed Weights   03/22/17 2122  Weight: 110 kg (242 lb 8.1 oz)    Exam:   General:  Afebrile, no chest pain, no shortness of breath. Patient still reports being fairly symptomatic about tingling, numbness, abdominal discomfort/cramping and also extremities pain. No nausea, no vomiting, no dysuria and seems in and out cath on 6/2 overnight, no difficulty urinating.  Cardiovascular: S1 and S2 appreciated, no rubs, no gallops, no JVD.  Respiratory: No use of accessory muscles, breathing comfortable while wearing his chronic nasal cannula oxygen supplementation;  no wheezing, no crackles, good air movement  bilaterally.  Abdomen: Soft, positive bowel sounds, no guarding.   Musculoskeletal: TED hoses, in place, 1+ edema bilaterally, no cyanosis or clubbing  Data Reviewed: Basic Metabolic Panel:  Recent Labs Lab 03/22/17 1624 03/22/17 1736 03/23/17 0054 03/23/17 0735 03/24/17 0439 03/24/17 1427  NA  --  141 142  141 141 139 139  K  --  3.5 3.5  3.5 4.1 3.7 4.3  CL  --  102 102  101 104 104 103  CO2  --  28 29  29 28 27 29   GLUCOSE  --  96 128*  124* 92 124* 101*  BUN  --  15 19  20  21* 19 19  CREATININE  --  1.08 1.16  1.17 1.06 1.01 1.02  CALCIUM  --  5.9* 6.0*  6.0* 6.1* 6.7* 7.1*  MG 1.5*  --   --  2.0  --   --   PHOS 3.7  --   --   --   --   --    Liver Function Tests:  Recent Labs Lab 03/22/17 1451 03/22/17 2047  AST 29 28  ALT 14* 15*  ALKPHOS 89 77  BILITOT 0.6 0.4  PROT 6.7 6.3*  ALBUMIN 3.6 3.3*   CBC:  Recent Labs Lab 03/22/17 1451 03/23/17 0054  WBC 9.0 8.1  NEUTROABS 7.2  --   HGB 12.0* 12.2*  HCT 35.6* 36.7*  MCV 95.2 96.6  PLT 208 205   Cardiac Enzymes:  Recent Labs Lab 03/22/17 2047 03/23/17 0054 03/23/17 0735  TROPONINI <0.03 <0.03 <0.03   BNP (last 3 results)  Recent Labs  10/10/16 1209 12/22/16 0128 02/27/17 1651  BNP 48.0 24.8 27.6    ProBNP (last 3 results)  Recent Labs  10/07/16 1105  PROBNP 72.0    CBG: No results for input(s): GLUCAP in the last 168 hours.  Recent Results (from the past 240 hour(s))  MRSA PCR Screening     Status: None   Collection Time: 03/22/17  8:51 PM  Result Value Ref Range Status   MRSA by PCR NEGATIVE NEGATIVE Final    Comment:        The GeneXpert MRSA Assay (FDA approved for NASAL specimens only), is one component of a comprehensive MRSA colonization surveillance program. It is not intended to diagnose MRSA infection nor to guide or monitor treatment for MRSA infections.      Studies: No results found.  Scheduled Meds: . calcitRIOL  0.25 mcg Oral Daily  .  calcium-vitamin D  2 tablet Oral TID AC & HS  . citalopram  10 mg Oral Daily  . clopidogrel  75 mg Oral QAC breakfast  . cyanocobalamin  500 mcg Oral Daily  . cycloSPORINE  1 drop Both Eyes BID  . enoxaparin (LOVENOX) injection  40 mg Subcutaneous QHS  . famotidine  20 mg Oral QHS  . HYDROcodone-acetaminophen  1 tablet Oral BID  . ipratropium-albuterol  3 mL Nebulization BID  . levothyroxine  200 mcg Oral QAC breakfast  . levothyroxine  25 mcg Oral QAC breakfast  . loratadine  10 mg Oral Daily  . metoprolol tartrate  12.5 mg Oral BID  . mometasone-formoterol  2 puff Inhalation BID  . pantoprazole  20 mg Oral Daily  . polyvinyl alcohol  1 drop Both Eyes BID  . sodium chloride flush  3 mL Intravenous Q12H  . tamsulosin  0.4 mg Oral QPC supper  . traZODone  300  mg Oral QHS   Continuous Infusions: . calcium gluconate      Principal Problem:   Hypocalcemia Active Problems:   Hypoparathyroidism (HCC)   Dementia   COPD (chronic obstructive pulmonary disease) (HCC)   Coronary artery disease   HTN (hypertension)   GERD (gastroesophageal reflux disease)   Anxiety   Hypomagnesemia   Chest pain    Time spent: 25 minutes    Barton Dubois  [facility] Pager [phone]. If 7PM-7AM, please contact night-coverage at [email or web address], password [facility] 03/24/2017, 7:08 PM  LOS: 1 day

## 2017-03-24 NOTE — Care Management Note (Signed)
Case Management Note  Patient Details  Name: Preet Perrier Hauss MRN: [id number] Date of Birth: 07-07-1941  Subjective/Objective:  76 y/o m admitted w/hypocalcemia. From ALF-[facility],has rw. [facility]. CSW will manage transp back to ALF.                  Action/Plan:d/c ALF.   Expected Discharge Date:                  Expected Discharge Plan:  [place]  In-House Referral:     Discharge planning Services  CM Consult  Post Acute Care Choice:  Durable Medical Equipment (home 02, rw) Choice offered to:  Patient  DME Arranged:    DME Agency:     HH Arranged:  PT [place]:  Other - See comment [facility] has own HHPT-[facility])  Status of Service:  In process, will continue to follow  If discussed at Long Length of Stay Meetings, dates discussed:    Additional Comments:  Dessa Phi, RN 03/24/2017, 9:45 AM

## 2017-03-24 NOTE — Clinical Social Work Note (Addendum)
Clinical Social Work Assessment  Patient Details  Name: [name] [name] MRN: [id number] Date of Birth: [birth year]  Date of referral:  03/24/17               Reason for consult:  Facility Placement                Permission sought to share information with:  Facility Sport and exercise psychologist, Family Supports Permission granted to share information::  Yes, Verbal Permission Granted  Name::     [name] [name]  Agency::  [facility]  Relationship::  Daughter   Contact Information:  [phone]  Housing/Transportation Living arrangements for the past 2 months:  [place] of Information:  Patient Patient Interpreter Needed:  None Criminal Activity/Legal Involvement Pertinent to Current Situation/Hospitalization:  No - Comment as needed Significant Relationships:  Adult Children Lives with:  Facility Resident Do you feel safe going back to the place where you live?  Yes Need for family participation in patient care:  No (Coment)  Care giving concerns:  Patient reported no care giving concerns at this time. Patient from [place] and wants to return.   Social Worker assessment / plan:  CSW spoke with patient at bedside, patient alert and oriented x4. Patient reported that he has been at his current ALF for 2 years and that it's "not bad". Patient reported that he wants to return to current ALF and requested [facility] for transportation. Patient reported that he has a daughter in the area and that she can be notified when patient is discharged back to ALF. CSW contacted [facility] and confirmed patient's ability to return. CSW will complete FL2 and assist patient with discharge back to ALF when medically stable.   Employment status:  Retired Forensic scientist:  Medicare PT Recommendations:  Not assessed at this time [place] / Referral to community resources:  Other (Comment Required) ([place] )  Patient/Family's Response to care:   Patient agreeable to returning back to current ALF.  Patient/Family's Understanding of and Emotional Response to Diagnosis, Current Treatment, and Prognosis:  Patient verbalized understanding of current diagnosis and plan to return back to ALF. Patient reported that he is feeling better. Patient presented pleasant when speaking with CSW and appreciative of intervention.   Emotional Assessment Appearance:  Appears stated age Attitude/Demeanor/Rapport:  Other (Cooperative) Affect (typically observed):  Appropriate Orientation:  Oriented to Self, Oriented to Place, Oriented to  Time, Oriented to Situation Alcohol / Substance use:  Not Applicable Psych involvement (Current and /or in the community):  No (Comment)  Discharge Needs  Concerns to be addressed:  No discharge needs identified Readmission within the last 30 days:  No Current discharge risk:  None Barriers to Discharge:  No Barriers Identified   Burnis Medin, LCSW 03/24/2017, 11:02 AM

## 2017-03-24 NOTE — Progress Notes (Signed)
Patient states he does not wear a cpap.  

## 2017-03-24 NOTE — Care Management Note (Signed)
Case Management Note  Patient Details  Name: [name] [name] MRN: [id number] Date of Birth: 22-Jun-1941  Subjective/Objective:  Spoke to ALF-rep Mia states they use [facility] for HHPT-rep [name] aware of d/c today & HHPT. No further CM needs.Already has home 02.CSW will manage transp back.                  Action/Plan:d/c ALF w/HHC.   Expected Discharge Date:                  Expected Discharge Plan:  [place]  In-House Referral:     Discharge planning Services  CM Consult  Post Acute Care Choice:  Durable Medical Equipment (home 02, rw) Choice offered to:  Patient  DME Arranged:    DME Agency:     HH Arranged:  PT [place]:  Other - See comment [facility] has own HHPT-[facility])  Status of Service:  Completed, signed off  If discussed at [place] of Stay Meetings, dates discussed:    Additional Comments:  Dessa Phi, RN 03/24/2017, 2:39 PM

## 2017-03-25 DIAGNOSIS — I2510 Atherosclerotic heart disease of native coronary artery without angina pectoris: Secondary | ICD-10-CM

## 2017-03-25 LAB — BASIC METABOLIC PANEL
ANION GAP: 10 (ref 5–15)
BUN: 20 mg/dL (ref 6–20)
CHLORIDE: 101 mmol/L (ref 101–111)
CO2: 28 mmol/L (ref 22–32)
CREATININE: 0.99 mg/dL (ref 0.61–1.24)
Calcium: 7.7 mg/dL — ABNORMAL LOW (ref 8.9–10.3)
GFR calc Af Amer: >60 mL/min (ref >60)
GFR calc non Af Amer: >60 mL/min (ref >60)
Glucose, Bld: 114 mg/dL — ABNORMAL HIGH (ref 65–99)
POTASSIUM: 3.8 mmol/L (ref 3.5–5.1)
SODIUM: 139 mmol/L (ref 135–145)

## 2017-03-25 MED ORDER — CALCITRIOL 0.25 MCG PO CAPS: 0.2500 ug | ORAL_CAPSULE | Freq: Every day | ORAL | 1 refills | Status: DC

## 2017-03-25 MED ORDER — POLYETHYLENE GLYCOL 3350 17 GM/SCOOP PO POWD: 17.0000 g | Freq: Two times a day (BID) | ORAL | Status: AC | PRN

## 2017-03-25 MED ORDER — PANTOPRAZOLE SODIUM 20 MG PO TBEC: 20.0000 mg | DELAYED_RELEASE_TABLET | Freq: Every day | ORAL | 1 refills | Status: AC

## 2017-03-25 MED ORDER — CALCIUM CARBONATE-VITAMIN D 500-200 MG-UNIT PO TABS: 2.0000 | ORAL_TABLET | Freq: Three times a day (TID) | ORAL | 1 refills | Status: AC

## 2017-03-25 NOTE — Progress Notes (Signed)
Report given to RN at [facility]

## 2017-03-25 NOTE — NC FL2 (Signed)
[place] LEVEL OF CARE SCREENING TOOL     IDENTIFICATION  Patient Name: [name] [name] [name] [name] Birthdate: 03/04/1941 Sex: male Admission Date (Current Location): 03/22/2017  [facility] and [place] Number:  Herbalist and Address:  [facility],  [place], [place]      Provider Number: [id number]  Attending Physician Name and Address:  Barton Dubois, MD  Relative Name and Phone Number:       Current Level of Care: Hospital Recommended Level of Care: [place] Prior Approval Number:    Date Approved/Denied:   PASRR Number:    Discharge Plan: Other (Comment) ([place])    Current Diagnoses: Patient Active Problem List   Diagnosis Date Noted  . GERD (gastroesophageal reflux disease) 03/22/2017  . Anxiety 03/22/2017  . Hypomagnesemia 03/22/2017  . Chest pain 03/22/2017  . Musculoskeletal chest pain   . Bronchitis 10/10/2016  . CKD (chronic kidney disease) stage 3, GFR 30-59 ml/min 10/10/2016  . HTN (hypertension) 10/10/2016  . Hypothyroid 10/10/2016  . Chronic diastolic CHF (congestive heart failure) ([name] [name]) 03/03/2016  . Coronary artery disease 03/03/2016  . Thoracic aortic aneurysm ([place]) 03/03/2016  . Dementia 02/23/2016  . COPD (chronic obstructive pulmonary disease) ([place]) 02/23/2016  . Oliguria 02/23/2016  . Atypical chest pain 02/23/2016  . Chronic respiratory failure with hypoxia and hypercapnia ([place]) 09/27/2015  . Morbid obesity ([place]) 07/13/2015  . Dyspnea on exertion 07/12/2015  . OSA (obstructive sleep apnea) 11/15/2011  . Renal mass, left 09/14/2011  . DISORDERS OF DIAPHRAGM 07/02/2010  . OBESITY HYPOVENTILATION SYNDROME 02/14/2009  . Multiple pulmonary nodules 02/14/2009  . CHOLELITHIASIS 05/04/2008  . Nonspecific (abnormal) findings on radiological and other examination of body structure 04/28/2008  . ABNORMAL CHEST XRAY 04/28/2008  . 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  Hypoparathyroidism ([place]) 03/31/2008  . Hypocalcemia 03/31/2008  . 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 DYSLIPIDEMIA 09/21/2007  . Hypertensive heart disease with CHF (congestive heart failure) ([place]) 09/21/2007  . Allergic rhinitis 09/21/2007    Orientation RESPIRATION BLADDER Height & Weight     Self, Time, Situation, Place  O2 Continent Weight: 242 lb 8.1 oz (110 kg) Height:  6\' 4"  (193 cm)  BEHAVIORAL SYMPTOMS/MOOD NEUROLOGICAL BOWEL NUTRITION STATUS      Continent Diet (no added table salt)  AMBULATORY STATUS COMMUNICATION OF NEEDS Skin   Limited Assist Verbally Normal                       Personal Care Assistance Level of Assistance  Feeding, Bathing, Dressing Bathing Assistance: Limited assistance Feeding assistance: Independent Dressing Assistance: Limited assistance     Functional Limitations Info             SPECIAL CARE FACTORS FREQUENCY                       Contractures Contractures Info: Not present    Additional Factors Info  Code Status, Allergies Code Status Info: Full Code  Allergies Info: NKA            Current Discharge Medication List        START taking these medications   Details  calcitRIOL (ROCALTROL) 0.25 MCG capsule Take 1 capsule (0.25 mcg total) by mouth daily. Qty: 30 capsule, Refills: 1    calcium-vitamin D (OSCAL WITH D) 500-200 MG-UNIT tablet Take 2 tablets by mouth 4 (four) times daily -  before meals and at bedtime.  Qty: 180 tablet, Refills: 1          CONTINUE these medications which have CHANGED   Details  pantoprazole (PROTONIX) 20 MG tablet Take 1 tablet (20 mg total) by mouth daily. Qty: 30 tablet, Refills: 1    polyethylene glycol powder (MIRALAX) powder Take 17 g by mouth 2 (two) times daily as needed (constipaiton).          CONTINUE these medications which have NOT CHANGED   Details  albuterol (PROVENTIL) (2.5 MG/3ML) 0.083% nebulizer solution Take 2.5 mg by nebulization 2  (two) times daily. Reported on 03/13/2016    calcium carbonate (TUMS) 500 MG chewable tablet Chew 2 tablets by mouth daily as needed for indigestion or heartburn.    cetirizine (ZYRTEC) 10 MG tablet Take 10 mg by mouth daily.    citalopram (CELEXA) 10 MG/5ML suspension Take 10 mg by mouth daily.    clopidogrel (PLAVIX) 75 MG tablet Take 75 mg by mouth every morning.     cycloSPORINE (RESTASIS) 0.05 % ophthalmic emulsion Place 1 drop into both eyes 2 (two) times daily.    diclofenac sodium (VOLTAREN) 1 % GEL Apply 2 g topically 2 (two) times daily as needed (pain).     fluticasone (FLONASE) 50 MCG/ACT nasal spray Place 2 sprays into the nose daily as needed for allergies.     HYDROcodone-acetaminophen (NORCO/VICODIN) 5-325 MG tablet Take 1 tablet by mouth every 6 (six) hours as needed for moderate pain. Qty: 30 tablet, Refills: 0    ipratropium (ATROVENT) 0.02 % nebulizer solution Take 0.5 mg by nebulization 2 (two) times daily.     !! levothyroxine (SYNTHROID, LEVOTHROID) 200 MCG tablet Take 200 mcg by mouth daily before breakfast.    !! levothyroxine (SYNTHROID, LEVOTHROID) 25 MCG tablet Take 25 mcg by mouth daily before breakfast.    !! LORazepam (ATIVAN) 0.5 MG tablet Take 0.5 mg by mouth 2 (two) times daily as needed for anxiety.    !! LORazepam (ATIVAN) 1 MG tablet Take 1 mg by mouth at bedtime.    magnesium citrate SOLN Take 1 Bottle by mouth daily as needed for moderate constipation or severe constipation.    Melatonin 3 MG TABS Take 3 mg by mouth at bedtime as needed.    metoprolol tartrate (LOPRESSOR) 25 MG tablet Take 12.5 mg by mouth 2 (two) times daily. Hold for systolic blood pressure <967    mometasone-formoterol (DULERA) 100-5 MCG/ACT AERO Inhale 2 puffs into the lungs 2 (two) times daily.    nystatin cream (MYCOSTATIN) Apply 1 application topically 2 (two) times daily as needed for dry skin.    OXYGEN Inhale into the lungs at bedtime.     ranitidine (ZANTAC) 150 MG tablet Take 150 mg by mouth daily.    sodium chloride (OCEAN) 0.65 % SOLN nasal spray Place 2 sprays into both nostrils 3 (three) times daily as needed for congestion.    Tamsulosin HCl (FLOMAX) 0.4 MG CAPS Take 0.4 mg by mouth daily after supper. Hold if SBP < 100    traZODone (DESYREL) 150 MG tablet Take 300 mg by mouth at bedtime.     !! - Potential duplicate medications found. Please discuss with provider.       STOP taking these medications     Calcium Carbonate-Vitamin D (CALCIUM PLUS VITAMIN D) 500-50 MG-UNIT CAPS      ALPRAZolam (XANAX) 0.25 MG tablet      carboxymethylcellulose 1 % ophthalmic solution      cyanocobalamin 500 MCG  tablet      Dextromethorphan-Guaifenesin (DIABETIC TUSSIN MAX ST PO)      methocarbamol (ROBAXIN) 500 MG tablet      oxymetazoline (AFRIN) 0.05 % nasal spray      Sodium Chloride, Inhalant, 7 % NEBU       Relevant Imaging Results:  Relevant Lab Results:   Additional Information SSN:[id number]  Burnis Medin, LCSW

## 2017-03-25 NOTE — Discharge Summary (Signed)
Physician Discharge Summary  Alejandro Gamel Brett RDE:[id number] DOB: 10-10-1941 DOA: 03/22/2017  PCP: Lesia Hausen, PA  Admit date: 03/22/2017 Discharge date: 03/25/2017  Time spent: 35 minutes  Recommendations for Outpatient Follow-up:  1. Repeat basic metabolic panel to assess electrolytes trend (especially calcium level). 2. Repeat magnesium and phosphorus level as well.   Discharge Diagnoses:  Principal Problem:   Hypocalcemia Active Problems:   Hypoparathyroidism (HCC)   Dementia   COPD (chronic obstructive pulmonary disease) (HCC)   Coronary artery disease   HTN (hypertension)   GERD (gastroesophageal reflux disease)   Anxiety   Hypomagnesemia   Chest pain   Discharge Condition: Stable and improved. Patient has been discharged back to assisted living facility. Advised to arrange follow-up with PCP in one week.  Diet recommendation: Heart healthy diet  Filed Weights   03/22/17 2122  Weight: 110 kg (242 lb 8.1 oz)    Brief History of present illness:  76 y.o.malewith medical history significant of hypertension, hyperlipidemia, COPD on 2 L oxygen at home, GERD, hypothyroidism, s/p ofthyroidectomy, anxiety, OSA on CPAP, CAD, myocardial infarction, dementia, renal carcinoma, arthritis, BPH, hypoparathyroidism, who presents with a tingling and numbness.  Hospital Course:  1-hypocalcemia and hypomagnesemia: Symptomatic -Calcium much improved. 7.9 at discharge -Magnesium and phosphorus within normal limits at discharge -TSH within normal limits -Will continue aggressive calcium repletion and has made adjustment to his maintenance supplementations -Recommend basic metabolic panel to follow electrolytes at follow-up  2-history of hypo-parathyroidism -PTH 7 -will benefit of outpatient follow up after discharge -will continue calcium supplementation   3-history of GERD  -Continue PPI and Pepcid  4-COPD: With chronic respiratory failure, oxygen dependent. -Overall  stable. -Will continue oxygen supplementation -Continue Combivent inhaler regimen -No wheezing and no using accessory muscle.   5-history of coronary artery disease -No acute ischemic changes on EKG or telemetry -Troponin negative 3 -Patient denies any further chest discomfort at this moment. -History of pain most likely associated with musculoskeletal cramping from electrolytes disorder. -Will continue Plavix and metoprolol.  6-hypothyroidism -TSH within normal limits -Will continue Synthroid at current dose  7-history of anxiety -Continue as needed Ativan   8-depression and insomnia -Stable overall.  -No suicidal ideation or hallucination  -Continue Celexa, continue with amiodarone and continue melatonin.   9-BPH -will continue flomax -no further complaints of urinary retention -no dysuria   10-diarrhea  -No further episodes of diarrhea  -Most likely associated with the use of Laxatives -will monitor and resume use of PRN miralax; patient with chronic constipation from hypothyroidism   Procedures:  See below for x-ray reports  Consultations:  None  Discharge Exam: Vitals:   03/24/17 2300 03/25/17 0435  BP: 116/67 (!) 105/59  Pulse: 76 74  Resp:  18  Temp:  98.2 F (36.8 C)    General:  Afebrile, no chest pain, no shortness of breath. Patient reports significant improvement in his muscular crampy pain and also generalized tingling and numbness. No nausea, no vomiting, no dysuria and seems in and out cath on 6/2 overnight, no difficulty urinating.  Cardiovascular: S1 and S2 appreciated, no rubs, no gallops, no JVD.  Respiratory: No use of accessory muscles, breathing comfortable while wearing his chronic nasal cannula oxygen supplementation; no wheezing, no crackles, good air movement bilaterally.  Abdomen: Soft, positive bowel sounds, no guarding.   Musculoskeletal: TED hoses, in place, 1+ edema bilaterally, no cyanosis or clubbing   Discharge  Instructions   Discharge Instructions    Diet - low sodium heart  healthy    Complete by:  As directed    Discharge instructions    Complete by:  As directed    Maintain adequate hydration Follow heart healthy diet  Take medications as prescribed  Arrange follow up with PCP in 10 days     Current Discharge Medication List    START taking these medications   Details  calcitRIOL (ROCALTROL) 0.25 MCG capsule Take 1 capsule (0.25 mcg total) by mouth daily. Qty: 30 capsule, Refills: 1    calcium-vitamin D (OSCAL WITH D) 500-200 MG-UNIT tablet Take 2 tablets by mouth 4 (four) times daily -  before meals and at bedtime. Qty: 180 tablet, Refills: 1      CONTINUE these medications which have CHANGED   Details  pantoprazole (PROTONIX) 20 MG tablet Take 1 tablet (20 mg total) by mouth daily. Qty: 30 tablet, Refills: 1    polyethylene glycol powder (MIRALAX) powder Take 17 g by mouth 2 (two) times daily as needed (constipaiton).      CONTINUE these medications which have NOT CHANGED   Details  albuterol (PROVENTIL) (2.5 MG/3ML) 0.083% nebulizer solution Take 2.5 mg by nebulization 2 (two) times daily. Reported on 03/13/2016    calcium carbonate (TUMS) 500 MG chewable tablet Chew 2 tablets by mouth daily as needed for indigestion or heartburn.    cetirizine (ZYRTEC) 10 MG tablet Take 10 mg by mouth daily.    citalopram (CELEXA) 10 MG/5ML suspension Take 10 mg by mouth daily.    clopidogrel (PLAVIX) 75 MG tablet Take 75 mg by mouth every morning.     cycloSPORINE (RESTASIS) 0.05 % ophthalmic emulsion Place 1 drop into both eyes 2 (two) times daily.    diclofenac sodium (VOLTAREN) 1 % GEL Apply 2 g topically 2 (two) times daily as needed (pain).     fluticasone (FLONASE) 50 MCG/ACT nasal spray Place 2 sprays into the nose daily as needed for allergies.     HYDROcodone-acetaminophen (NORCO/VICODIN) 5-325 MG tablet Take 1 tablet by mouth every 6 (six) hours as needed for moderate  pain. Qty: 30 tablet, Refills: 0    ipratropium (ATROVENT) 0.02 % nebulizer solution Take 0.5 mg by nebulization 2 (two) times daily.     !! levothyroxine (SYNTHROID, LEVOTHROID) 200 MCG tablet Take 200 mcg by mouth daily before breakfast.    !! levothyroxine (SYNTHROID, LEVOTHROID) 25 MCG tablet Take 25 mcg by mouth daily before breakfast.    !! LORazepam (ATIVAN) 0.5 MG tablet Take 0.5 mg by mouth 2 (two) times daily as needed for anxiety.    !! LORazepam (ATIVAN) 1 MG tablet Take 1 mg by mouth at bedtime.    magnesium citrate SOLN Take 1 Bottle by mouth daily as needed for moderate constipation or severe constipation.    Melatonin 3 MG TABS Take 3 mg by mouth at bedtime as needed.    metoprolol tartrate (LOPRESSOR) 25 MG tablet Take 12.5 mg by mouth 2 (two) times daily. Hold for systolic blood pressure <128    mometasone-formoterol (DULERA) 100-5 MCG/ACT AERO Inhale 2 puffs into the lungs 2 (two) times daily.    nystatin cream (MYCOSTATIN) Apply 1 application topically 2 (two) times daily as needed for dry skin.    OXYGEN Inhale into the lungs at bedtime.    ranitidine (ZANTAC) 150 MG tablet Take 150 mg by mouth daily.    sodium chloride (OCEAN) 0.65 % SOLN nasal spray Place 2 sprays into both nostrils 3 (three) times daily as needed for congestion.  Tamsulosin HCl (FLOMAX) 0.4 MG CAPS Take 0.4 mg by mouth daily after supper. Hold if SBP < 100    traZODone (DESYREL) 150 MG tablet Take 300 mg by mouth at bedtime.     !! - Potential duplicate medications found. Please discuss with provider.    STOP taking these medications     Calcium Carbonate-Vitamin D (CALCIUM PLUS VITAMIN D) 500-50 MG-UNIT CAPS      ALPRAZolam (XANAX) 0.25 MG tablet      carboxymethylcellulose 1 % ophthalmic solution      cyanocobalamin 500 MCG tablet      Dextromethorphan-Guaifenesin (DIABETIC TUSSIN MAX ST PO)      methocarbamol (ROBAXIN) 500 MG tablet      oxymetazoline (AFRIN) 0.05 % nasal  spray      Sodium Chloride, Inhalant, 7 % NEBU        No Known Allergies Follow-up Information    [facility] Follow up.   Specialty:  [place] Why:  [facility] physical therapy Contact information: [place] [address] [phone]        Lesia Hausen, [place]. Schedule an appointment as soon as possible for a visit in 1 week(s).   Specialty:  Internal Medicine Contact information: [address] [place] [address] [phone]            The results of significant diagnostics from this hospitalization (including imaging, microbiology, ancillary and laboratory) are listed below for reference.    Significant Diagnostic Studies: Dg Chest 2 View  Result Date: 03/22/2017 CLINICAL DATA:  Bilateral extremity numbness for the past 2 days. History of COPD with O2 dependence. EXAM: CHEST  2 VIEW COMPARISON:  02/27/2017; 11/05/2016; 10/10/2016; chest CT - 10/10/2016 FINDINGS: Grossly unchanged cardiac silhouette and mediastinal contours. Bibasilar heterogeneous opacities are unchanged in favored to represent atelectasis. No new focal airspace opacities. Interposed colon is noted below the right hemidiaphragm. No pleural effusion or pneumothorax. No evidence of edema. No acute osseus abnormalities. Post right total shoulder replacement, incompletely evaluated. Moderate degenerative change of the left glenohumeral joint, incompletely evaluated. IMPRESSION: No acute cardiopulmonary disease. Electronically Signed   By: Sandi Mariscal M.D.   On: 03/22/2017 15:12   Dg Chest 2 View  Result Date: 02/27/2017 CLINICAL DATA:  Chest pain. EXAM: CHEST  2 VIEW COMPARISON:  December 22, 2016 FINDINGS: The cardiomediastinal silhouette is stable. Opacity in the right base, suspected to represent atelectasis. No other changes. IMPRESSION: Opacity in the right base, probably atelectasis. Electronically Signed   By: Dorise Bullion III M.D   On:  02/27/2017 17:42    Microbiology: Recent Results (from the past 240 hour(s))  MRSA PCR Screening     Status: None   Collection Time: 03/22/17  8:51 PM  Result Value Ref Range Status   MRSA by PCR NEGATIVE NEGATIVE Final    Comment:        The GeneXpert MRSA Assay (FDA approved for NASAL specimens only), is one component of a comprehensive MRSA colonization surveillance program. It is not intended to diagnose MRSA infection nor to guide or monitor treatment for MRSA infections.      Labs: Basic Metabolic Panel:  Recent Labs Lab 03/22/17 1624  03/23/17 0054 03/23/17 0735 03/24/17 0439 03/24/17 1427 03/25/17 0553  NA  --   < > 142  141 141 139 139 139  K  --   < > 3.5  3.5 4.1 3.7 4.3 3.8  CL  --   < > 102  101 104 104 103 101  CO2  --   < > 29  29 28 27 29 28   GLUCOSE  --   < > 128*  124* 92 124* 101* 114*  BUN  --   < > 19  20 21* 19 19 20   CREATININE  --   < > 1.16  1.17 1.06 1.01 1.02 0.99  CALCIUM  --   < > 6.0*  6.0* 6.1* 6.7* 7.1* 7.7*  MG 1.5*  --   --  2.0  --   --   --   PHOS 3.7  --   --   --   --   --   --   < > = values in this interval not displayed. Liver Function Tests:  Recent Labs Lab 03/22/17 1451 03/22/17 2047  AST 29 28  ALT 14* 15*  ALKPHOS 89 77  BILITOT 0.6 0.4  PROT 6.7 6.3*  ALBUMIN 3.6 3.3*   CBC:  Recent Labs Lab 03/22/17 1451 03/23/17 0054  WBC 9.0 8.1  NEUTROABS 7.2  --   HGB 12.0* 12.2*  HCT 35.6* 36.7*  MCV 95.2 96.6  PLT 208 205   Cardiac Enzymes:  Recent Labs Lab 03/22/17 2047 03/23/17 0054 03/23/17 0735  TROPONINI <0.03 <0.03 <0.03   BNP: BNP (last 3 results)  Recent Labs  10/10/16 1209 12/22/16 0128 02/27/17 1651  BNP 48.0 24.8 27.6    ProBNP (last 3 results)  Recent Labs  10/07/16 1105  PROBNP 72.0    Signed:  Barton Dubois MD.  [facility] 03/25/2017, 2:38 PM

## 2017-03-25 NOTE — Progress Notes (Signed)
Patient returning to current [facility]. CSW contacted facility confirmed patient's ability to return and provided all requested clinical documents. [facility] contacted, CSW attempted to contact patient's daughter and notify her about transport, no answer. Patient's RN can call report to [phone].  Abundio Miu, [place] Social Worker [facility] Cell#: [phone]

## 2017-03-31 ENCOUNTER — Emergency Department (HOSPITAL_COMMUNITY): Payer: Medicare Other

## 2017-03-31 ENCOUNTER — Emergency Department (HOSPITAL_COMMUNITY)
Admission: EM | Admit: 2017-03-31 | Discharge: 2017-03-31 | Disposition: A | Discharge status: Home / Self Care | Payer: Medicare Other | Attending: Emergency Medicine | Admitting: Emergency Medicine

## 2017-03-31 ENCOUNTER — Encounter (HOSPITAL_COMMUNITY): Payer: Self-pay

## 2017-03-31 DIAGNOSIS — Z85528 Personal history of other malignant neoplasm of kidney: Secondary | ICD-10-CM | POA: Diagnosis not present

## 2017-03-31 DIAGNOSIS — N183 Chronic kidney disease, stage 3 (moderate): Secondary | ICD-10-CM | POA: Diagnosis not present

## 2017-03-31 DIAGNOSIS — Z96653 Presence of artificial knee joint, bilateral: Secondary | ICD-10-CM | POA: Insufficient documentation

## 2017-03-31 DIAGNOSIS — G309 Alzheimer's disease, unspecified: Secondary | ICD-10-CM | POA: Diagnosis not present

## 2017-03-31 DIAGNOSIS — F419 Anxiety disorder, unspecified: Secondary | ICD-10-CM

## 2017-03-31 DIAGNOSIS — J449 Chronic obstructive pulmonary disease, unspecified: Secondary | ICD-10-CM | POA: Insufficient documentation

## 2017-03-31 DIAGNOSIS — R45851 Suicidal ideations: Secondary | ICD-10-CM

## 2017-03-31 DIAGNOSIS — I130 Hypertensive heart and chronic kidney disease with heart failure and stage 1 through stage 4 chronic kidney disease, or unspecified chronic kidney disease: Secondary | ICD-10-CM | POA: Insufficient documentation

## 2017-03-31 DIAGNOSIS — I252 Old myocardial infarction: Secondary | ICD-10-CM | POA: Diagnosis not present

## 2017-03-31 DIAGNOSIS — R079 Chest pain, unspecified: Secondary | ICD-10-CM | POA: Insufficient documentation

## 2017-03-31 DIAGNOSIS — F331 Major depressive disorder, recurrent, moderate: Secondary | ICD-10-CM | POA: Insufficient documentation

## 2017-03-31 DIAGNOSIS — Z79899 Other long term (current) drug therapy: Secondary | ICD-10-CM | POA: Insufficient documentation

## 2017-03-31 DIAGNOSIS — I2510 Atherosclerotic heart disease of native coronary artery without angina pectoris: Secondary | ICD-10-CM | POA: Insufficient documentation

## 2017-03-31 DIAGNOSIS — E039 Hypothyroidism, unspecified: Secondary | ICD-10-CM | POA: Diagnosis not present

## 2017-03-31 DIAGNOSIS — I5032 Chronic diastolic (congestive) heart failure: Secondary | ICD-10-CM | POA: Insufficient documentation

## 2017-03-31 LAB — BASIC METABOLIC PANEL
ANION GAP: 10 (ref 5–15)
BUN: 16 mg/dL (ref 6–20)
CO2: 27 mmol/L (ref 22–32)
Calcium: 7.1 mg/dL — ABNORMAL LOW (ref 8.9–10.3)
Chloride: 100 mmol/L — ABNORMAL LOW (ref 101–111)
Creatinine, Ser: 1.18 mg/dL (ref 0.61–1.24)
GFR calc Af Amer: >60 mL/min (ref >60)
GFR, EST NON AFRICAN AMERICAN: 58 mL/min — AB (ref >60)
GLUCOSE: 105 mg/dL — AB (ref 65–99)
POTASSIUM: 3.9 mmol/L (ref 3.5–5.1)
Sodium: 137 mmol/L (ref 135–145)

## 2017-03-31 LAB — URINALYSIS, ROUTINE W REFLEX MICROSCOPIC
BILIRUBIN URINE: NEGATIVE
Glucose, UA: NEGATIVE mg/dL
HGB URINE DIPSTICK: NEGATIVE
KETONES UR: NEGATIVE mg/dL
NITRITE: NEGATIVE
Protein, ur: NEGATIVE mg/dL
SPECIFIC GRAVITY, URINE: 1.014 (ref 1.005–1.030)
Squamous Epithelial / LPF: NONE SEEN
pH: 7 (ref 5.0–8.0)

## 2017-03-31 LAB — PROTIME-INR
INR: 0.96
Prothrombin Time: 12.8 seconds (ref 11.4–15.2)

## 2017-03-31 LAB — I-STAT TROPONIN, ED: Troponin i, poc: 0 ng/mL (ref 0.00–0.08)

## 2017-03-31 LAB — CBC
HEMATOCRIT: 33.4 % — AB (ref 39.0–52.0)
HEMOGLOBIN: 10.7 g/dL — AB (ref 13.0–17.0)
MCH: 31.2 pg (ref 26.0–34.0)
MCHC: 32 g/dL (ref 30.0–36.0)
MCV: 97.4 fL (ref 78.0–100.0)
Platelets: 179 10*3/uL (ref 150–400)
RBC: 3.43 MIL/uL — ABNORMAL LOW (ref 4.22–5.81)
RDW: 14.1 % (ref 11.5–15.5)
WBC: 6.5 10*3/uL (ref 4.0–10.5)

## 2017-03-31 LAB — RAPID URINE DRUG SCREEN, HOSP PERFORMED
Amphetamines: NOT DETECTED
Barbiturates: NOT DETECTED
Benzodiazepines: NOT DETECTED
COCAINE: NOT DETECTED
OPIATES: POSITIVE — AB
TETRAHYDROCANNABINOL: NOT DETECTED

## 2017-03-31 MED ORDER — CYCLOSPORINE 0.05 % OP EMUL: 1.0000 [drp] | Freq: Two times a day (BID) | OPHTHALMIC | Status: DC

## 2017-03-31 MED ORDER — CALCITRIOL 0.25 MCG PO CAPS: 0.2500 ug | ORAL_CAPSULE | Freq: Every day | ORAL | Status: DC

## 2017-03-31 MED ORDER — LEVOTHYROXINE SODIUM 200 MCG PO TABS: 200.0000 ug | ORAL_TABLET | Freq: Every day | ORAL | Status: DC

## 2017-03-31 MED ORDER — IPRATROPIUM BROMIDE 0.02 % IN SOLN: 0.5000 mg | Freq: Two times a day (BID) | RESPIRATORY_TRACT | Status: DC

## 2017-03-31 MED ORDER — POLYETHYLENE GLYCOL 3350 17 GM/SCOOP PO POWD: 17.0000 g | Freq: Two times a day (BID) | ORAL | Status: DC | PRN

## 2017-03-31 MED ORDER — FLUTICASONE PROPIONATE 50 MCG/ACT NA SUSP: 2.0000 | Freq: Every day | NASAL | Status: DC | PRN

## 2017-03-31 MED ORDER — CLOPIDOGREL BISULFATE 75 MG PO TABS: 75.0000 mg | ORAL_TABLET | ORAL | Status: DC

## 2017-03-31 MED ORDER — PANTOPRAZOLE SODIUM 20 MG PO TBEC: 20.0000 mg | DELAYED_RELEASE_TABLET | Freq: Every day | ORAL | Status: DC

## 2017-03-31 MED ORDER — METOPROLOL TARTRATE 25 MG PO TABS: 12.5000 mg | ORAL_TABLET | Freq: Two times a day (BID) | ORAL | Status: DC

## 2017-03-31 MED ORDER — ALBUTEROL SULFATE (2.5 MG/3ML) 0.083% IN NEBU: 2.5000 mg | INHALATION_SOLUTION | Freq: Two times a day (BID) | RESPIRATORY_TRACT | Status: DC

## 2017-03-31 MED ORDER — CALCIUM CARBONATE-VITAMIN D 500-200 MG-UNIT PO TABS: 2.0000 | ORAL_TABLET | Freq: Three times a day (TID) | ORAL | Status: DC

## 2017-03-31 MED ORDER — CITALOPRAM HYDROBROMIDE 10 MG/5ML PO SOLN: 10.0000 mg | Freq: Every day | ORAL | Status: DC

## 2017-03-31 MED ORDER — LORAZEPAM 1 MG PO TABS: 1.0000 mg | ORAL_TABLET | Freq: Every day | ORAL | Status: DC

## 2017-03-31 MED ORDER — TRAZODONE HCL 50 MG PO TABS: 300.0000 mg | ORAL_TABLET | Freq: Every day | ORAL | Status: DC

## 2017-03-31 MED ORDER — TRAZODONE HCL 50 MG PO TABS: 150.0000 mg | ORAL_TABLET | Freq: Every day | ORAL | Status: DC

## 2017-03-31 MED ORDER — LEVOTHYROXINE SODIUM 25 MCG PO TABS: 25.0000 ug | ORAL_TABLET | Freq: Every day | ORAL | Status: DC

## 2017-03-31 MED ORDER — MOMETASONE FURO-FORMOTEROL FUM 100-5 MCG/ACT IN AERO: 2.0000 | INHALATION_SPRAY | Freq: Two times a day (BID) | RESPIRATORY_TRACT | Status: DC

## 2017-03-31 MED ORDER — TAMSULOSIN HCL 0.4 MG PO CAPS: 0.4000 mg | ORAL_CAPSULE | Freq: Every day | ORAL | Status: DC

## 2017-03-31 MED ORDER — SALINE SPRAY 0.65 % NA SOLN: 2.0000 | Freq: Three times a day (TID) | NASAL | Status: DC | PRN

## 2017-03-31 MED ORDER — LORAZEPAM 0.5 MG PO TABS: 0.5000 mg | ORAL_TABLET | Freq: Two times a day (BID) | ORAL | Status: DC | PRN

## 2017-03-31 NOTE — ED Notes (Signed)
Hold home medication due to discharge per Dr. Marsa Aris.

## 2017-03-31 NOTE — ED Provider Notes (Signed)
Discussion with behavioral health:  Patient is not homicidal or suicidal. No psychosis. Recommend follow-up with community mental health.   Nat Christen, MD 03/31/17 [phone]

## 2017-03-31 NOTE — ED Provider Notes (Signed)
[facility] DEPT Provider Note   CSN: [id number] Arrival date & time: 03/31/17  1231     History   Chief Complaint Chief Complaint  Patient presents with  . Chest Pain  . Suicidal    HPI [name] [name] is a 76 y.o. male.  HPI Patient states he has 2 chief complaints.  Firstly he reports that he has sent over here for suicidal thoughts or comments. He reports that a lot of things have happened at one time to make him feel like he might just want to give up. He reports that he was talking to one of the assistance at the assisted living and describing how he was feeling in that he sometimes felt like it might make him just for jump out of a window. He reports he does not believe in suicide and does not have a plan for killing himself. He reports that just the accumulation of multiple medical problems and physical pains that are recurrent issues start to get overwhelming.  Patient reports he gets chest pains off and on, its been going on for weeks or months. They wax and wane. Quality is a pressure or tightness. Sometimes it seems associated with activity but other times not. It may last several minutes or more to time. No associated symptoms. Sometimes he wonders if is due to anxiety. Past Medical History:  Diagnosis Date  . Allergic rhinitis, cause unspecified   . Arthritis    osteoarthritis  . Cancer ([place])    renal carcinoma  . COPD (chronic obstructive pulmonary disease) (HCC)    [name] FOLLOWS PT--PT USING OXYGEN 24 HRS A DAY  . COPD (chronic obstructive pulmonary disease) (HCC)    PULMONARY CLEARANCE FOR LEFT RENAL CRYOABLATION ON CHART FROM DR. CLANCE -07/26/11  . Coronary artery disease    CARDIAC CLEARANCE  OFFICE NOTE FOR LEFT CRYO ABLATION ON CHART FROM [name] -"RECENT STRESS TEST DID NOT SHOW ISCHEMIA" -"PRESUMED CAD DUE TO CORONARY CALCIFICATIONS"   . DEMENTIA    Alzheimer's, senile dementia  . Family history of ischemic heart disease   . Family  history of malignant neoplasm of gastrointestinal tract   . GERD (gastroesophageal reflux disease)   . Hypothyroidism   . Myocardial infarction ([place])   . Obesity hypoventilation syndrome ([place])    failed cpap/bipap trial  . Other and unspecified hyperlipidemia   . Other congenital hamartoses, not elsewhere classified   . Pneumonia    HX     . Shortness of breath   . Sleep apnea    OXYGEN 24 HR/DAY-NO CPAP  . Unspecified essential hypertension     Patient Active Problem List   Diagnosis Date Noted  . GERD (gastroesophageal reflux disease) 03/22/2017  . Anxiety 03/22/2017  . Hypomagnesemia 03/22/2017  . Chest pain 03/22/2017  . Musculoskeletal chest pain   . Bronchitis 10/10/2016  . CKD (chronic kidney disease) stage 3, GFR 30-59 ml/min 10/10/2016  . HTN (hypertension) 10/10/2016  . Hypothyroid 10/10/2016  . Chronic diastolic CHF (congestive heart failure) ([place]) 03/03/2016  . Coronary artery disease 03/03/2016  . Thoracic aortic aneurysm ([place]) 03/03/2016  . Dementia 02/23/2016  . COPD (chronic obstructive pulmonary disease) ([place]) 02/23/2016  . Oliguria 02/23/2016  . Atypical chest pain 02/23/2016  . Chronic respiratory failure with hypoxia and hypercapnia ([place]) 09/27/2015  . Morbid obesity ([place]) 07/13/2015  . Dyspnea on exertion 07/12/2015  . OSA (obstructive sleep apnea) 11/15/2011  . Renal mass, left 09/14/2011  . DISORDERS OF DIAPHRAGM 07/02/2010  .  OBESITY HYPOVENTILATION SYNDROME 02/14/2009  . Multiple pulmonary nodules 02/14/2009  . CHOLELITHIASIS 05/04/2008  . Nonspecific (abnormal) findings on radiological and other examination of body structure 04/28/2008  . ABNORMAL CHEST XRAY 04/28/2008  . 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 Hypocalcemia 03/31/2008  . 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 DYSLIPIDEMIA 09/21/2007  . Hypertensive heart disease with CHF (congestive heart failure) ([place]) 09/21/2007  . Allergic  rhinitis 09/21/2007    Past Surgical History:  Procedure Laterality Date  . CARPAL TUNNEL RELEASE     right  . CHOLECYSTECTOMY  2009  . EYE SURGERY     bilateral cataracts  . JOINT REPLACEMENT     bilateral knee replacements  . KNEE ARTHROPLASTY     2005 LEFT   AND 2007 RIGHT--[name]  . THYROIDECTOMY    . TUMOR REMOVAL  09/14/2011       Home Medications    Prior to Admission medications   Medication Sig Start Date End Date Taking? Authorizing Provider  albuterol (PROVENTIL) (2.5 MG/3ML) 0.083% nebulizer solution Take 2.5 mg by nebulization 2 (two) times daily. Reported on 03/13/2016    [provider]  calcitRIOL (ROCALTROL) 0.25 MCG capsule Take 1 capsule (0.25 mcg total) by mouth daily. 03/26/17   Barton Dubois, MD  calcium carbonate (TUMS) 500 MG chewable tablet Chew 2 tablets by mouth daily as needed for indigestion or heartburn.    [provider]  calcium-vitamin D (OSCAL WITH D) 500-200 MG-UNIT tablet Take 2 tablets by mouth 4 (four) times daily -  before meals and at bedtime. 03/25/17   Barton Dubois, MD  cetirizine (ZYRTEC) 10 MG tablet Take 10 mg by mouth daily.    [provider]  citalopram (CELEXA) 10 MG/5ML suspension Take 10 mg by mouth daily.    [provider]  clopidogrel (PLAVIX) 75 MG tablet Take 75 mg by mouth every morning.     [provider]  cycloSPORINE (RESTASIS) 0.05 % ophthalmic emulsion Place 1 drop into both eyes 2 (two) times daily.    [provider]  diclofenac sodium (VOLTAREN) 1 % GEL Apply 2 g topically 2 (two) times daily as needed (pain).     [provider]  fluticasone (FLONASE) 50 MCG/ACT nasal spray Place 2 sprays into the nose daily as needed for allergies.     [provider]  HYDROcodone-acetaminophen (NORCO/VICODIN) 5-325 MG tablet Take 1 tablet by mouth every 6 (six) hours as needed for moderate pain. Patient taking differently: Take 1 tablet by mouth 2 (two)  times daily.  02/27/16   Domenic Polite, MD  ipratropium (ATROVENT) 0.02 % nebulizer solution Take 0.5 mg by nebulization 2 (two) times daily.     [provider]  levothyroxine (SYNTHROID, LEVOTHROID) 200 MCG tablet Take 200 mcg by mouth daily before breakfast.    [provider]  levothyroxine (SYNTHROID, LEVOTHROID) 25 MCG tablet Take 25 mcg by mouth daily before breakfast.    [provider]  LORazepam (ATIVAN) 0.5 MG tablet Take 0.5 mg by mouth 2 (two) times daily as needed for anxiety.    [provider]  LORazepam (ATIVAN) 1 MG tablet Take 1 mg by mouth at bedtime.    [provider]  magnesium citrate SOLN Take 1 Bottle by mouth daily as needed for moderate constipation or severe constipation.    [provider]  Melatonin 3 MG TABS Take 3 mg by mouth at bedtime as needed.    [provider]  metoprolol tartrate (LOPRESSOR) 25 MG tablet Take 12.5 mg by mouth 2 (two) times daily. Hold for systolic blood pressure <034    [provider]  mometasone-formoterol (DULERA) 100-5 MCG/ACT AERO Inhale 2 puffs into the lungs 2 (two) times daily.    [provider]  nystatin cream (MYCOSTATIN) Apply 1 application topically 2 (two) times daily as needed for dry skin.    [provider]  OXYGEN Inhale into the lungs at bedtime.    [provider]  pantoprazole (PROTONIX) 20 MG tablet Take 1 tablet (20 mg total) by mouth daily. 03/25/17   Barton Dubois, MD  polyethylene glycol powder (MIRALAX) powder Take 17 g by mouth 2 (two) times daily as needed (constipaiton). 03/25/17   Barton Dubois, MD  ranitidine (ZANTAC) 150 MG tablet Take 150 mg by mouth daily.    [provider]  sodium chloride (OCEAN) 0.65 % SOLN nasal spray Place 2 sprays into both nostrils 3 (three) times daily as needed for congestion.    [provider]  Tamsulosin HCl (FLOMAX) 0.4 MG CAPS Take 0.4 mg by mouth daily after  supper. Hold if SBP < 100    [provider]  traZODone (DESYREL) 150 MG tablet Take 300 mg by mouth at bedtime.    [provider]    Family History Family History  Problem Relation Age of Onset  . Cancer Other        colon  . Heart disease Other        CAD, MI and Heart Attack    Social History Social History  Substance Use Topics  . Smoking status: Never Smoker  . Smokeless tobacco: Never Used  . Alcohol use No     Allergies   Patient has no known allergies.   Review of Systems Review of Systems 10 Systems reviewed and are negative for acute change except as noted in the HPI.   Physical Exam Updated Vital Signs BP 112/67   Pulse 70   Temp 99.6 F (37.6 C) (Oral)   Resp 20   Ht 6\' 3"  (1.905 m)   Wt 113.4 kg (250 lb)   SpO2 97%   BMI 31.25 kg/m   Physical Exam  Constitutional: He is oriented to person, place, and time. He appears well-developed and well-nourished.  HENT:  Head: Normocephalic and atraumatic.  Mouth/Throat: Oropharynx is clear and moist.  Eyes: Conjunctivae and EOM are normal.  Neck: Neck supple.  Cardiovascular: Normal rate, regular rhythm and intact distal pulses.   No murmur heard. Pulmonary/Chest: Effort normal and breath sounds normal. No respiratory distress.  Abdominal: Soft. He exhibits no distension. There is tenderness. There is no guarding.  Mild diffuse upper tenderness, no guarding. (Patient reports this is typical for him).  Musculoskeletal: Normal range of motion. He exhibits no edema or tenderness.  Condition of feet is very good. No wounds or cellulitis. Peripheral edema. Diffuse chronic hyperpigmented plaques of lower pretibial areas. No signs of secondary infection. Very chronic in appearance and stable  Neurological: He is alert and oriented to person, place, and time. No cranial nerve deficit or sensory deficit. He exhibits normal muscle tone. Coordination normal.  Skin: Skin is warm and dry.    Psychiatric: He has a normal mood and affect.  Nursing note and vitals reviewed.    ED Treatments / Results  Labs (all labs ordered are listed, but only abnormal results are displayed) Labs Reviewed  BASIC METABOLIC PANEL - Abnormal; Notable  for the following:       Result Value   Chloride 100 (*)    Glucose, Bld 105 (*)    Calcium 7.1 (*)    GFR calc non Af Amer 58 (*)    All other components within normal limits  CBC - Abnormal; Notable for the following:    RBC 3.43 (*)    Hemoglobin 10.7 (*)    HCT 33.4 (*)    All other components within normal limits  URINALYSIS, ROUTINE W REFLEX MICROSCOPIC - Abnormal; Notable for the following:    Leukocytes, UA TRACE (*)    Bacteria, UA RARE (*)    All other components within normal limits  RAPID URINE DRUG SCREEN, HOSP PERFORMED - Abnormal; Notable for the following:    Opiates POSITIVE (*)    All other components within normal limits  PROTIME-INR  CALCIUM, IONIZED  I-STAT TROPOININ, ED    EKG  EKG Interpretation  Date/Time:  Monday March 31 2017 12:49:00 EDT Ventricular Rate:  67 PR Interval:    QRS Duration: 92 QT Interval:  417 QTC Calculation: 441 R Axis:   -11 Text Interpretation:  Sinus rhythm Low voltage, precordial leads Baseline wander in lead(s) II III aVF V3 V4 V5 V6 normal, no change from previous Confirmed by Charlesetta Shanks [phone]) on 03/31/2017 1:39:06 PM       Radiology Dg Chest 2 View  Result Date: 03/31/2017 CLINICAL DATA:  Chest pain that started 3-4 days ago EXAM: CHEST  2 VIEW COMPARISON:  03/22/2017 FINDINGS: Chronic cardiomegaly. Stable mediastinal contours. Low volume chest with interstitial crowding. There is no edema, consolidation, effusion, or pneumothorax. Right glenohumeral arthroplasty. IMPRESSION: Chronic cardiomegaly and low volume chest. No acute superimposed finding. Electronically Signed   By: Monte Fantasia M.D.   On: 03/31/2017 13:52    Procedures Procedures (including critical  care time)  Medications Ordered in ED Medications  calcium-vitamin D (OSCAL WITH D) 500-200 MG-UNIT per tablet 2 tablet (not administered)  albuterol (PROVENTIL) (2.5 MG/3ML) 0.083% nebulizer solution 2.5 mg (not administered)  calcitRIOL (ROCALTROL) capsule 0.25 mcg (not administered)  calcium-vitamin D (OSCAL WITH D) 500-200 MG-UNIT per tablet 2 tablet (not administered)  citalopram (CELEXA) 10 MG/5ML suspension 10 mg (not administered)  clopidogrel (PLAVIX) tablet 75 mg (not administered)  cycloSPORINE (RESTASIS) 0.05 % ophthalmic emulsion 1 drop (not administered)  fluticasone (FLONASE) 50 MCG/ACT nasal spray 2 spray (not administered)  ipratropium (ATROVENT) nebulizer solution 0.5 mg (not administered)  levothyroxine (SYNTHROID, LEVOTHROID) tablet 200 mcg (not administered)  levothyroxine (SYNTHROID, LEVOTHROID) tablet 25 mcg (not administered)  LORazepam (ATIVAN) tablet 0.5 mg (not administered)  LORazepam (ATIVAN) tablet 1 mg (not administered)  metoprolol tartrate (LOPRESSOR) tablet 12.5 mg (not administered)  mometasone-formoterol (DULERA) 100-5 MCG/ACT inhaler 2 puff (not administered)  pantoprazole (PROTONIX) EC tablet 20 mg (not administered)  polyethylene glycol powder (GLYCOLAX/MIRALAX) container 17 g (not administered)  sodium chloride (OCEAN) 0.65 % nasal spray 2 spray (not administered)  tamsulosin (FLOMAX) capsule 0.4 mg (not administered)  traZODone (DESYREL) tablet 300 mg (not administered)     Initial Impression / Assessment and Plan / ED Course  I have reviewed the triage vital signs and the nursing notes.  Pertinent labs & imaging results that were available during my care of the patient were reviewed by me and considered in my medical decision making (see chart for details).      Final Clinical Impressions(s) / ED Diagnoses   Final diagnoses:  Suicidal ideations  Moderate episode of  recurrent major depressive disorder ([place])  Anxiety  Chest pain,  unspecified type   Patient presents with 2 chief complaints. Chest pain has been waxing and waning for weeks. It is not really associated with activity. At this time I feel is low probability of cardiac ischemic etiology. This does not appear consistent with unstable angina. EKG and troponin are within normal limits. I feel this can be further evaluated by follow-up with cardiology.  Patient's other complaint is increasing depression and passive suicidal ideation due to increasing medical problems and anxiety and depression. Patient has not attempted any self injury to this point. He does however recognize that multiple etiologies are increasing his depression and driving thoughts of not wanting to be here anymore. Patient is medically cleared for TTS. Consultation. Final disposition pending TTS consultation. New Prescriptions New Prescriptions   No medications on file     Charlesetta Shanks, MD 03/31/17 [phone]

## 2017-03-31 NOTE — ED Notes (Signed)
PA at bedside.

## 2017-03-31 NOTE — ED Notes (Signed)
Pt given [place] tray and belongings returned.

## 2017-03-31 NOTE — ED Notes (Signed)
ED Provider at bedside. 

## 2017-03-31 NOTE — ED Triage Notes (Addendum)
Pt arrives [facility] with c/o chest pain started 3-4 days ago. Described as elephant on chest . Increased with exertion. Given 6 mg of morphine after refused nitro. Given aspirin 324 PTA now pain free. C/o continued SI. And states he wants to jump out of window and end it. Previous attempts.

## 2017-03-31 NOTE — ED Notes (Signed)
TTS at bedside. 
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
## 2017-03-31 NOTE — ED Notes (Signed)
Dr. Colvin Caroli aware that medications delayed due to TTS.

## 2017-03-31 NOTE — Discharge Instructions (Signed)
Follow-up with your primary care doctor.  We recommend counseling in the community.  None of his home medications were given in the emergency department.

## 2017-04-01 LAB — CALCIUM, IONIZED: Calcium, Ionized, Serum: 4 mg/dL — ABNORMAL LOW (ref 4.5–5.6)

## 2017-04-11 ENCOUNTER — Emergency Department (HOSPITAL_COMMUNITY): Payer: Medicare Other

## 2017-04-11 ENCOUNTER — Encounter (HOSPITAL_COMMUNITY): Payer: Self-pay | Admitting: Obstetrics and Gynecology

## 2017-04-11 ENCOUNTER — Emergency Department (HOSPITAL_COMMUNITY)
Admission: EM | Admit: 2017-04-11 | Discharge: 2017-04-11 | Disposition: A | Discharge status: Home / Self Care | Payer: Medicare Other | Attending: Emergency Medicine | Admitting: Emergency Medicine

## 2017-04-11 DIAGNOSIS — S20212A Contusion of left front wall of thorax, initial encounter: Secondary | ICD-10-CM | POA: Insufficient documentation

## 2017-04-11 DIAGNOSIS — Y9389 Activity, other specified: Secondary | ICD-10-CM | POA: Insufficient documentation

## 2017-04-11 DIAGNOSIS — I2510 Atherosclerotic heart disease of native coronary artery without angina pectoris: Secondary | ICD-10-CM | POA: Insufficient documentation

## 2017-04-11 DIAGNOSIS — Y92129 Unspecified place in nursing home as the place of occurrence of the external cause: Secondary | ICD-10-CM | POA: Diagnosis not present

## 2017-04-11 DIAGNOSIS — J449 Chronic obstructive pulmonary disease, unspecified: Secondary | ICD-10-CM | POA: Diagnosis not present

## 2017-04-11 DIAGNOSIS — Z7902 Long term (current) use of antithrombotics/antiplatelets: Secondary | ICD-10-CM | POA: Diagnosis not present

## 2017-04-11 DIAGNOSIS — E039 Hypothyroidism, unspecified: Secondary | ICD-10-CM | POA: Insufficient documentation

## 2017-04-11 DIAGNOSIS — W19XXXA Unspecified fall, initial encounter: Secondary | ICD-10-CM

## 2017-04-11 DIAGNOSIS — R0600 Dyspnea, unspecified: Secondary | ICD-10-CM | POA: Insufficient documentation

## 2017-04-11 DIAGNOSIS — Z79899 Other long term (current) drug therapy: Secondary | ICD-10-CM | POA: Diagnosis not present

## 2017-04-11 DIAGNOSIS — N183 Chronic kidney disease, stage 3 (moderate): Secondary | ICD-10-CM | POA: Diagnosis not present

## 2017-04-11 DIAGNOSIS — S299XXA Unspecified injury of thorax, initial encounter: Secondary | ICD-10-CM | POA: Diagnosis present

## 2017-04-11 DIAGNOSIS — C649 Malignant neoplasm of unspecified kidney, except renal pelvis: Secondary | ICD-10-CM | POA: Insufficient documentation

## 2017-04-11 DIAGNOSIS — M542 Cervicalgia: Secondary | ICD-10-CM | POA: Diagnosis not present

## 2017-04-11 DIAGNOSIS — I5032 Chronic diastolic (congestive) heart failure: Secondary | ICD-10-CM | POA: Diagnosis not present

## 2017-04-11 DIAGNOSIS — Y999 Unspecified external cause status: Secondary | ICD-10-CM | POA: Diagnosis not present

## 2017-04-11 DIAGNOSIS — I130 Hypertensive heart and chronic kidney disease with heart failure and stage 1 through stage 4 chronic kidney disease, or unspecified chronic kidney disease: Secondary | ICD-10-CM | POA: Insufficient documentation

## 2017-04-11 DIAGNOSIS — R109 Unspecified abdominal pain: Secondary | ICD-10-CM | POA: Diagnosis not present

## 2017-04-11 DIAGNOSIS — Z96653 Presence of artificial knee joint, bilateral: Secondary | ICD-10-CM | POA: Diagnosis not present

## 2017-04-11 DIAGNOSIS — W01198A Fall on same level from slipping, tripping and stumbling with subsequent striking against other object, initial encounter: Secondary | ICD-10-CM | POA: Insufficient documentation

## 2017-04-11 LAB — CBC WITH DIFFERENTIAL/PLATELET
BASOS ABS: 0 10*3/uL (ref 0.0–0.1)
BASOS PCT: 0 %
EOS ABS: 0.1 10*3/uL (ref 0.0–0.7)
Eosinophils Relative: 1 %
HCT: 34.2 % — ABNORMAL LOW (ref 39.0–52.0)
HEMOGLOBIN: 11.3 g/dL — AB (ref 13.0–17.0)
Lymphocytes Relative: 13 %
Lymphs Abs: 1.1 10*3/uL (ref 0.7–4.0)
MCH: 32 pg (ref 26.0–34.0)
MCHC: 33 g/dL (ref 30.0–36.0)
MCV: 96.9 fL (ref 78.0–100.0)
MONOS PCT: 6 %
Monocytes Absolute: 0.5 10*3/uL (ref 0.1–1.0)
NEUTROS ABS: 7.1 10*3/uL (ref 1.7–7.7)
NEUTROS PCT: 80 %
Platelets: 188 10*3/uL (ref 150–400)
RBC: 3.53 MIL/uL — ABNORMAL LOW (ref 4.22–5.81)
RDW: 14.3 % (ref 11.5–15.5)
WBC: 8.8 10*3/uL (ref 4.0–10.5)

## 2017-04-11 LAB — COMPREHENSIVE METABOLIC PANEL
ALBUMIN: 3.4 g/dL — AB (ref 3.5–5.0)
ALK PHOS: 76 U/L (ref 38–126)
ALT: 9 U/L — ABNORMAL LOW (ref 17–63)
ANION GAP: 7 (ref 5–15)
AST: 21 U/L (ref 15–41)
BILIRUBIN TOTAL: 0.5 mg/dL (ref 0.3–1.2)
BUN: 21 mg/dL — AB (ref 6–20)
CO2: 28 mmol/L (ref 22–32)
Calcium: 7.3 mg/dL — ABNORMAL LOW (ref 8.9–10.3)
Chloride: 103 mmol/L (ref 101–111)
Creatinine, Ser: 1.16 mg/dL (ref 0.61–1.24)
GFR calc Af Amer: >60 mL/min (ref >60)
GFR calc non Af Amer: 59 mL/min — ABNORMAL LOW (ref >60)
GLUCOSE: 76 mg/dL (ref 65–99)
POTASSIUM: 3.9 mmol/L (ref 3.5–5.1)
SODIUM: 138 mmol/L (ref 135–145)
Total Protein: 6.5 g/dL (ref 6.5–8.1)

## 2017-04-11 MED ORDER — MORPHINE SULFATE (PF) 4 MG/ML IV SOLN
4.0000 mg | Freq: Once | INTRAVENOUS | Status: AC
  Administered 2017-04-11: 4 mg via INTRAVENOUS
  Filled 2017-04-11: qty 1

## 2017-04-11 MED ORDER — IOPAMIDOL (ISOVUE-300) INJECTION 61%
INTRAVENOUS | Status: AC
  Filled 2017-04-11: qty 100

## 2017-04-11 MED ORDER — LEVOFLOXACIN 500 MG PO TABS: 500.0000 mg | ORAL_TABLET | Freq: Every day | ORAL | 0 refills | Status: AC

## 2017-04-11 MED ORDER — ONDANSETRON HCL 4 MG/2ML IJ SOLN
4.0000 mg | Freq: Once | INTRAMUSCULAR | Status: AC
  Administered 2017-04-11: 4 mg via INTRAVENOUS
  Filled 2017-04-11: qty 2

## 2017-04-11 MED ORDER — IOPAMIDOL (ISOVUE-300) INJECTION 61%
100.0000 mL | Freq: Once | INTRAVENOUS | Status: AC | PRN
  Administered 2017-04-11: 100 mL via INTRAVENOUS

## 2017-04-11 NOTE — ED Notes (Signed)
Patient transported to CT 

## 2017-04-11 NOTE — ED Notes (Signed)
Patient transported to X-ray 

## 2017-04-11 NOTE — ED Notes (Signed)
Bed: WA11 Expected date:  Expected time:  Means of arrival:  Comments: EMS-fall 

## 2017-04-11 NOTE — Discharge Instructions (Signed)
Your CT does not show any injuries from your fall, but you likely bruising your left sided ribs. Please take your home Norco/Hydrocodone for this pain. You can also use your hold voltaren gel over the area where you having pain. You appear to have a small pneumonia on your CT scan.  Please take antibiotics as prescribed. Return for worsening symptoms, including fever, confusion, difficulty breathing, or any other symptoms concerning to you.

## 2017-04-11 NOTE — ED Provider Notes (Signed)
[facility] DEPT Provider Note   CSN: [id number] Arrival date & time: 04/11/17  1115     History   Chief Complaint Chief Complaint  Patient presents with  . Fall    HPI Revan Gendron Banales is a 76 y.o. male.  HPI  76 year old male who presents with fall just prior to arrival. History of dementia, COPD on 3 L and CAD. No anticoagulation. Sitting on commode this morning, tried to pull himself up from sitting, lost balance and fell on the left side. He denies head strike or LOC. No syncope or near syncope. Complains of left sided rib cage and neck soreness. Mild dyspnea. No fever or chills. No vision or speech changes, headache, numbness/weakness, back pain.   Past Medical History:  Diagnosis Date  . Allergic rhinitis, cause unspecified   . Arthritis    osteoarthritis  . Cancer ([place])    renal carcinoma  . COPD (chronic obstructive pulmonary disease) (HCC)    [name] FOLLOWS PT--PT USING OXYGEN 24 HRS A DAY  . COPD (chronic obstructive pulmonary disease) (HCC)    PULMONARY CLEARANCE FOR LEFT RENAL CRYOABLATION ON CHART FROM DR. CLANCE -07/26/11  . Coronary artery disease    CARDIAC CLEARANCE  OFFICE NOTE FOR LEFT CRYO ABLATION ON CHART FROM [name] -"RECENT STRESS TEST DID NOT SHOW ISCHEMIA" -"PRESUMED CAD DUE TO CORONARY CALCIFICATIONS"   . DEMENTIA    Alzheimer's, senile dementia  . Family history of ischemic heart disease   . Family history of malignant neoplasm of gastrointestinal tract   . GERD (gastroesophageal reflux disease)   . Hypothyroidism   . Myocardial infarction ([place])   . Obesity hypoventilation syndrome ([place])    failed cpap/bipap trial  . Other and unspecified hyperlipidemia   . Other congenital hamartoses, not elsewhere classified   . Pneumonia    HX     . Shortness of breath   . Sleep apnea    OXYGEN 24 HR/DAY-NO CPAP  . Unspecified essential hypertension     Patient Active Problem List   Diagnosis Date Noted  . GERD (gastroesophageal  reflux disease) 03/22/2017  . Anxiety 03/22/2017  . Hypomagnesemia 03/22/2017  . Chest pain 03/22/2017  . Musculoskeletal chest pain   . Bronchitis 10/10/2016  . CKD (chronic kidney disease) stage 3, GFR 30-59 ml/min 10/10/2016  . HTN (hypertension) 10/10/2016  . Hypothyroid 10/10/2016  . Chronic diastolic CHF (congestive heart failure) ([place]) 03/03/2016  . Coronary artery disease 03/03/2016  . Thoracic aortic aneurysm ([place]) 03/03/2016  . Dementia 02/23/2016  . COPD (chronic obstructive pulmonary disease) ([place]) 02/23/2016  . Oliguria 02/23/2016  . Atypical chest pain 02/23/2016  . Chronic respiratory failure with hypoxia and hypercapnia ([place]) 09/27/2015  . Morbid obesity ([place]) 07/13/2015  . Dyspnea on exertion 07/12/2015  . OSA (obstructive sleep apnea) 11/15/2011  . Renal mass, left 09/14/2011  . DISORDERS OF DIAPHRAGM 07/02/2010  . OBESITY HYPOVENTILATION SYNDROME 02/14/2009  . Multiple pulmonary nodules 02/14/2009  . CHOLELITHIASIS 05/04/2008  . Nonspecific (abnormal) findings on radiological and other examination of body structure 04/28/2008  . ABNORMAL CHEST XRAY 04/28/2008  . 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 Hypocalcemia 03/31/2008  . 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 DYSLIPIDEMIA 09/21/2007  . Hypertensive heart disease with CHF (congestive heart failure) ([place]) 09/21/2007  . Allergic rhinitis 09/21/2007    Past Surgical History:  Procedure Laterality Date  . CARPAL TUNNEL RELEASE     right  . CHOLECYSTECTOMY  2009  .  EYE SURGERY     bilateral cataracts  . JOINT REPLACEMENT     bilateral knee replacements  . KNEE ARTHROPLASTY     2005 LEFT   AND 2007 RIGHT--[name]  . THYROIDECTOMY    . TUMOR REMOVAL  09/14/2011       Home Medications    Prior to Admission medications   Medication Sig Start Date End Date Taking? Authorizing Provider  albuterol (PROVENTIL) (2.5 MG/3ML) 0.083% nebulizer  solution Take 2.5 mg by nebulization See admin instructions. Inhale one vial via handheld nebulizer twice daily - with ipratropium   Yes [provider]  calcitRIOL (ROCALTROL) 0.25 MCG capsule Take 1 capsule (0.25 mcg total) by mouth daily. Patient taking differently: Take 0.5 mcg by mouth daily.  03/26/17  Yes Barton Dubois, MD  Calcium Carb-Cholecalciferol (OYSCO 500+D PO) Take 2 tablets by mouth 4 (four) times daily. Before meals and at bedtime   Yes [provider]  calcium carbonate (TUMS) 500 MG chewable tablet Chew 2 tablets by mouth daily as needed for indigestion.    Yes [provider]  Calcium Carbonate-Vitamin D (CALCIUM-D PO) Take 1 capsule by mouth 4 (four) times daily. 500-50   Yes [provider]  calcium-vitamin D (OSCAL WITH D) 500-200 MG-UNIT tablet Take 2 tablets by mouth 4 (four) times daily -  before meals and at bedtime. 03/25/17  Yes Barton Dubois, MD  cetirizine (ZYRTEC) 10 MG tablet Take 10 mg by mouth daily.   Yes [provider]  citalopram (CELEXA) 10 MG tablet Take 20 mg by mouth daily.    Yes [provider]  clopidogrel (PLAVIX) 75 MG tablet Take 75 mg by mouth daily.    Yes [provider]  cycloSPORINE (RESTASIS) 0.05 % ophthalmic emulsion Place 1 drop into both eyes 2 (two) times daily.   Yes [provider]  diclofenac sodium (VOLTAREN) 1 % GEL Apply 2 g topically 2 (two) times daily as needed (pain).    Yes [provider]  fluticasone (FLONASE) 50 MCG/ACT nasal spray Place 2 sprays into the nose daily as needed for allergies.    Yes [provider]  HYDROcodone-acetaminophen (NORCO/VICODIN) 5-325 MG tablet Take 1 tablet by mouth every 6 (six) hours as needed for moderate pain. Patient taking differently: Take 1 tablet by mouth every 6 (six) hours as needed for moderate pain. Do not exceed 3 grams/24 hours of tylenol from all sources 02/27/16  Yes Domenic Polite, MD  ipratropium  (ATROVENT) 0.02 % nebulizer solution Take 0.5 mg by nebulization See admin instructions. Inhale one vial via handheld nebulizer twice daily - with albuterol   Yes [provider]  levothyroxine (SYNTHROID, LEVOTHROID) 200 MCG tablet Take 200 mcg by mouth See admin instructions. Take 1 tablet (200 mcg) by mouth daily - with a 25 mcg tablet for a 225 mcg dose   Yes [provider]  levothyroxine (SYNTHROID, LEVOTHROID) 25 MCG tablet Take 25 mcg by mouth See admin instructions. Take 1 tablet (25 mcg) by mouth daily - with a 200 mcg tablet for a 225 mcg dose   Yes [provider]  loratadine (CLARITIN) 10 MG tablet Take 10 mg by mouth daily.   Yes [provider]  LORazepam (ATIVAN) 0.5 MG tablet Take 0.5 mg by mouth 2 (two) times daily as needed for anxiety.   Yes [provider]  LORazepam (ATIVAN) 1 MG tablet Take 1 mg by mouth at bedtime.   Yes [provider]  magnesium citrate SOLN Take 1 Bottle by mouth daily as needed for moderate constipation or severe constipation.   Yes [provider]  Melatonin 3 MG TABS Take 3 mg by mouth at bedtime.    Yes [provider]  metoprolol tartrate (LOPRESSOR) 25 MG tablet Take 12.5 mg by mouth See admin instructions. Take 1/2 tablet (12.5 mg) by mouth twice daily (hold for sbp <100)   Yes [provider]  mometasone-formoterol (DULERA) 100-5 MCG/ACT AERO Inhale 2 puffs into the lungs 2 (two) times daily.   Yes [provider]  nystatin cream (MYCOSTATIN) Apply 1 application topically 2 (two) times daily as needed for dry skin.   Yes [provider]  OXYGEN Inhale into the lungs continuous.    Yes [provider]  pantoprazole (PROTONIX) 20 MG tablet Take 1 tablet (20 mg total) by mouth daily. 03/25/17  Yes Barton Dubois, MD  polyethylene glycol powder (MIRALAX) powder Take 17 g by mouth 2 (two) times daily as needed (constipaiton). Patient taking  differently: Take 17 g by mouth every other day. Mix in 8 oz glass of liquid and drink 03/25/17  Yes Barton Dubois, MD  ranitidine (ZANTAC) 150 MG tablet Take 150 mg by mouth daily.   Yes [provider]  sodium chloride (OCEAN) 0.65 % SOLN nasal spray Place 2 sprays into both nostrils 3 (three) times daily as needed for congestion (sinus symptoms).    Yes [provider]  Tamsulosin HCl (FLOMAX) 0.4 MG CAPS Take 0.4 mg by mouth daily after supper.    Yes [provider]  traZODone (DESYREL) 150 MG tablet Take 150 mg by mouth at bedtime.    Yes [provider]  levofloxacin (LEVAQUIN) 500 MG tablet Take 1 tablet (500 mg total) by mouth daily. 04/11/17   Forde Dandy, MD    Family History Family History  Problem Relation Age of Onset  . Cancer Other        colon  . Heart disease Other        CAD, MI and Heart Attack    Social History Social History  Substance Use Topics  . Smoking status: Never Smoker  . Smokeless tobacco: Never Used  . Alcohol use No     Allergies   Patient has no known allergies.   Review of Systems Review of Systems  Constitutional: Negative for fever.  Respiratory: Positive for shortness of breath.   Cardiovascular: Positive for chest pain.  Musculoskeletal: Positive for neck pain. Negative for back pain.  Neurological: Negative for syncope.  Hematological: Does not bruise/bleed easily.  All other systems reviewed and are negative.    Physical Exam Updated Vital Signs BP 131/69   Pulse 70   Temp 98.4 F (36.9 C)   Resp 17   Ht 6\' 3"  (1.905 m)   Wt 113.4 kg (250 lb)   SpO2 99%   BMI 31.25 kg/m   Physical Exam Physical Exam  Nursing note and vitals reviewed. Constitutional: Chronically ill appearing non-toxic, and in no acute distress Head: Normocephalic and atraumatic.  Mouth/Throat: Oropharynx is clear and moist.  Neck: Normal range of motion. Neck supple. mild generalized neck  soreness Cardiovascular: Normal rate and regular rhythm.   Pulmonary/Chest: Effort normal and breath sounds normal. left anterior chest wall pain Abdominal: Soft. There is no tenderness. There is no rebound and no guarding.  Musculoskeletal: Normal range of motion of all 4 extremities.  Neurological: Alert, no facial droop, fluent speech, moves all  extremities symmetrically Skin: Skin is warm and dry.  Psychiatric: Cooperative   ED Treatments / Results  Labs (all labs ordered are listed, but only abnormal results are displayed) Labs Reviewed  CBC WITH DIFFERENTIAL/PLATELET - Abnormal; Notable for the following:       Result Value   RBC 3.53 (*)    Hemoglobin 11.3 (*)    HCT 34.2 (*)    All other components within normal limits  COMPREHENSIVE METABOLIC PANEL - Abnormal; Notable for the following:    BUN 21 (*)    Calcium 7.3 (*)    Albumin 3.4 (*)    ALT 9 (*)    GFR calc non Af Amer 59 (*)    All other components within normal limits    EKG  EKG Interpretation None       Radiology Dg Ribs Unilateral W/chest Left  Result Date: 04/11/2017 CLINICAL DATA:  Left rib pain secondary to a fall from a toilet this morning. EXAM: LEFT RIBS AND CHEST - 3+ VIEW COMPARISON:  03/31/2017 and 03/22/2017 FINDINGS: No fracture or other bone lesions are seen involving the ribs. There is no evidence of pneumothorax or pleural effusion. Both lungs are clear except for minimal linear scarring at the left base laterally. Heart size and mediastinal contours are within normal limits. IMPRESSION: Negative. Electronically Signed   By: Lorriane Shire M.D.   On: 04/11/2017 13:14   Ct Chest W Contrast  Result Date: 04/11/2017 CLINICAL DATA:  Fall.  Left-sided rib pain and left abdominal pain. EXAM: CT CHEST, ABDOMEN, AND PELVIS WITH CONTRAST TECHNIQUE: Multidetector CT imaging of the chest, abdomen and pelvis was performed following the standard protocol during bolus administration of intravenous  contrast. CONTRAST:  124mL ISOVUE-300 IOPAMIDOL (ISOVUE-300) INJECTION 61% COMPARISON:  CT abdomen pelvis 06/24/2016 FINDINGS: CT CHEST FINDINGS Cardiovascular: There is atherosclerotic calcification of the aorta and coronary arteries. Heart size is normal. No pericardial effusion. There is a normal 3 vessel branching pattern of the aortic arch. Proximal arch vessels are non stenotic. Mediastinum/Nodes: No mediastinal, hilar or axillary lymphadenopathy. The visualized thyroid and thoracic esophageal course are unremarkable. Lungs/Pleura: Superior segment right lower lobe consolidation. No pleural effusion. Left basilar atelectasis. No pulmonary nodules. Musculoskeletal: Right total shoulder arthroplasty. No fracture or other acute osseous abnormality. CT ABDOMEN PELVIS FINDINGS Hepatobiliary: Status post cholecystectomy.  No focal liver lesion. Pancreas: Mild atrophy.  No focal abnormality. Spleen: No perisplenic hematoma or laceration. Adrenals/Urinary Tract: Normal adrenal glands. Interpolar left renal calculus measuring 5 mm. No hydronephrosis. At the lower pole of the left kidney there are sequelae of prior cryoablation. The appearance is unchanged compared to 06/24/2016. Stomach/Bowel: There is no hiatal hernia. The stomach is decompressed. The course of the duodenum is normal. There is no small bowel obstruction or inflammation. There is no focal colonic abnormality. Vascular/Lymphatic: There is atherosclerotic calcification of the non aneurysmal abdominal aorta. No abdominal or pelvic lymphadenopathy. Reproductive: Prostate calcifications. Other: Small umbilical hernia with a knuckle of transverse colon protruding through it. Musculoskeletal: Multilevel severe lumbar facet arthrosis and osteophytosis with moderate spinal canal stenosis at L3-4 and L4-5. IMPRESSION: 1. No acute traumatic abnormality of the chest, abdomen or pelvis. 2. Unchanged post cryoablation appearance of the lower pole of the left kidney.  3. Small area of consolidation in the superior segment of the right lower lobe. 4. Small umbilical hernia with a portion of transverse colon herniating into it. 5. Moderate spinal canal stenosis at L3-4 and L4-5. 6. Coronary artery and Aortic  Atherosclerosis (ICD10-I70.0). Electronically Signed   By: Ulyses Jarred M.D.   On: 04/11/2017 15:54   Ct Cervical Spine Wo Contrast  Result Date: 04/11/2017 CLINICAL DATA:  [place] this morning at home and injured neck. EXAM: CT CERVICAL SPINE WITHOUT CONTRAST TECHNIQUE: Multidetector CT imaging of the cervical spine was performed without intravenous contrast. Multiplanar CT image reconstructions were also generated. COMPARISON:  Radiographs 11/05/2016 and CT scan 03/15/2011 FINDINGS: Alignment: Advanced degenerative cervical spondylosis with multilevel disc disease and facet disease and mild multilevel degenerative subluxations. The overall alignment is normal and stable. Skull base and vertebrae: No acute fractures identified. Severe degenerative changes. Advanced C1-2 degenerative changes but no dens or C1 fracture. Soft tissues and spinal canal: No abnormal prevertebral soft tissue swelling. Disc levels: Overall stable multilevel disc disease and facet disease but the spinal canal is fairly generous and there is no significant spinal stenosis. There is significant left-sided foraminal stenosis at C2-3 due to uncinate spurring and severe left-sided facet disease. Bilateral severe foraminal stenosis at C3-4, left greater than right. Moderate-to-severe right-sided foraminal stenosis at C6-7. Upper chest: No significant findings. Other: Advanced carotid and vertebral artery calcifications. No neck mass or adenopathy. IMPRESSION: 1. Advanced degenerative cervical spondylosis with multilevel disc disease and facet disease not significantly change when compared to 2012. 2. No acute cervical spine fracture or abnormal prevertebral soft tissue swelling. 3. Stable appearing  significant left-sided foraminal stenosis at C2-3, bilateral stenosis at C3-4 and right-sided foraminal stenosis at C6-7. 4. Advanced vascular calcifications. Electronically Signed   By: Marijo Sanes M.D.   On: 04/11/2017 12:58   Ct Abdomen Pelvis W Contrast  Result Date: 04/11/2017 CLINICAL DATA:  Fall.  Left-sided rib pain and left abdominal pain. EXAM: CT CHEST, ABDOMEN, AND PELVIS WITH CONTRAST TECHNIQUE: Multidetector CT imaging of the chest, abdomen and pelvis was performed following the standard protocol during bolus administration of intravenous contrast. CONTRAST:  165mL ISOVUE-300 IOPAMIDOL (ISOVUE-300) INJECTION 61% COMPARISON:  CT abdomen pelvis 06/24/2016 FINDINGS: CT CHEST FINDINGS Cardiovascular: There is atherosclerotic calcification of the aorta and coronary arteries. Heart size is normal. No pericardial effusion. There is a normal 3 vessel branching pattern of the aortic arch. Proximal arch vessels are non stenotic. Mediastinum/Nodes: No mediastinal, hilar or axillary lymphadenopathy. The visualized thyroid and thoracic esophageal course are unremarkable. Lungs/Pleura: Superior segment right lower lobe consolidation. No pleural effusion. Left basilar atelectasis. No pulmonary nodules. Musculoskeletal: Right total shoulder arthroplasty. No fracture or other acute osseous abnormality. CT ABDOMEN PELVIS FINDINGS Hepatobiliary: Status post cholecystectomy.  No focal liver lesion. Pancreas: Mild atrophy.  No focal abnormality. Spleen: No perisplenic hematoma or laceration. Adrenals/Urinary Tract: Normal adrenal glands. Interpolar left renal calculus measuring 5 mm. No hydronephrosis. At the lower pole of the left kidney there are sequelae of prior cryoablation. The appearance is unchanged compared to 06/24/2016. Stomach/Bowel: There is no hiatal hernia. The stomach is decompressed. The course of the duodenum is normal. There is no small bowel obstruction or inflammation. There is no focal colonic  abnormality. Vascular/Lymphatic: There is atherosclerotic calcification of the non aneurysmal abdominal aorta. No abdominal or pelvic lymphadenopathy. Reproductive: Prostate calcifications. Other: Small umbilical hernia with a knuckle of transverse colon protruding through it. Musculoskeletal: Multilevel severe lumbar facet arthrosis and osteophytosis with moderate spinal canal stenosis at L3-4 and L4-5. IMPRESSION: 1. No acute traumatic abnormality of the chest, abdomen or pelvis. 2. Unchanged post cryoablation appearance of the lower pole of the left kidney. 3. Small area of consolidation in the superior segment  of the right lower lobe. 4. Small umbilical hernia with a portion of transverse colon herniating into it. 5. Moderate spinal canal stenosis at L3-4 and L4-5. 6. Coronary artery and Aortic Atherosclerosis (ICD10-I70.0). Electronically Signed   By: Ulyses Jarred M.D.   On: 04/11/2017 15:54    Procedures Procedures (including critical care time)  Medications Ordered in ED Medications  iopamidol (ISOVUE-300) 61 % injection (  Hold 04/11/17 1530)  morphine 4 MG/ML injection 4 mg (4 mg Intravenous Given 04/11/17 1247)  ondansetron (ZOFRAN) injection 4 mg (4 mg Intravenous Given 04/11/17 1247)  morphine 4 MG/ML injection 4 mg (4 mg Intravenous Given 04/11/17 1610)  iopamidol (ISOVUE-300) 61 % injection 100 mL (100 mLs Intravenous Contrast Given 04/11/17 1523)     Initial Impression / Assessment and Plan / ED Course  I have reviewed the triage vital signs and the nursing notes.  Pertinent labs & imaging results that were available during my care of the patient were reviewed by me and considered in my medical decision making (see chart for details).     Presents after mechanical fall with severe left sided rib pain and abdominal pain. No-toxic. Vital stable. No anticoagulation. Pain not improved after first dose morphine, and initial CXR w/o fracture. Given ongoing pain symptoms and abdominal  pain, CT chest/abd/pelvis obtained to eavluate for multiple rib fractures and intraabdominal injury. This is visualized and without acute injuries. Possible pneumonia on CT chest and some cough and mild dyspnea. No fever or leukocytosis or systemic symptoms of illness. Will treat with levaquin given nursing home patient. Felt stable for discharge. Takes Norco for pain at nursing facility. Strict return and follow-up instructions reviewed. He expressed understanding of all discharge instructions and felt comfortable with the plan of care.   Final Clinical Impressions(s) / ED Diagnoses   Final diagnoses:  Fall, initial encounter  Contusion of rib on left side, initial encounter    New Prescriptions New Prescriptions   LEVOFLOXACIN (LEVAQUIN) 500 MG TABLET    Take 1 tablet (500 mg total) by mouth daily.     Forde Dandy, MD 04/11/17 [phone]

## 2017-04-11 NOTE — ED Triage Notes (Signed)
Per EMS:  Pt from [facility] Pt fell earlier today at 8:30, he was sitting on the commode, and slipped off and has pain on his left side. Pain with palpation. AXO x 4  Pt has  A hx of COPD and is on 3L of oxygen via nasal canula  Pt also complains of neck pain.   Vitals 104/62 66 HR 95 on 3L PT rates the pain at 6/10 with coughing  Pt also reports pain in left hip and tailbone area from fall

## 2017-05-19 ENCOUNTER — Emergency Department (HOSPITAL_COMMUNITY): Payer: Medicare Other

## 2017-05-19 ENCOUNTER — Emergency Department (HOSPITAL_COMMUNITY)
Admission: EM | Admit: 2017-05-19 | Discharge: 2017-05-19 | Disposition: A | Discharge status: Home / Self Care | Payer: Medicare Other | Attending: Emergency Medicine | Admitting: Emergency Medicine

## 2017-05-19 ENCOUNTER — Encounter (HOSPITAL_COMMUNITY): Payer: Self-pay | Admitting: Emergency Medicine

## 2017-05-19 DIAGNOSIS — R0789 Other chest pain: Secondary | ICD-10-CM | POA: Diagnosis present

## 2017-05-19 DIAGNOSIS — I5032 Chronic diastolic (congestive) heart failure: Secondary | ICD-10-CM | POA: Insufficient documentation

## 2017-05-19 DIAGNOSIS — N183 Chronic kidney disease, stage 3 (moderate): Secondary | ICD-10-CM | POA: Insufficient documentation

## 2017-05-19 DIAGNOSIS — R079 Chest pain, unspecified: Secondary | ICD-10-CM

## 2017-05-19 DIAGNOSIS — R1084 Generalized abdominal pain: Secondary | ICD-10-CM | POA: Insufficient documentation

## 2017-05-19 DIAGNOSIS — R109 Unspecified abdominal pain: Secondary | ICD-10-CM

## 2017-05-19 DIAGNOSIS — Z96653 Presence of artificial knee joint, bilateral: Secondary | ICD-10-CM | POA: Insufficient documentation

## 2017-05-19 DIAGNOSIS — I130 Hypertensive heart and chronic kidney disease with heart failure and stage 1 through stage 4 chronic kidney disease, or unspecified chronic kidney disease: Secondary | ICD-10-CM | POA: Insufficient documentation

## 2017-05-19 DIAGNOSIS — J449 Chronic obstructive pulmonary disease, unspecified: Secondary | ICD-10-CM | POA: Diagnosis not present

## 2017-05-19 DIAGNOSIS — E039 Hypothyroidism, unspecified: Secondary | ICD-10-CM | POA: Insufficient documentation

## 2017-05-19 DIAGNOSIS — I2510 Atherosclerotic heart disease of native coronary artery without angina pectoris: Secondary | ICD-10-CM | POA: Insufficient documentation

## 2017-05-19 LAB — BASIC METABOLIC PANEL
ANION GAP: 10 (ref 5–15)
BUN: 20 mg/dL (ref 6–20)
CALCIUM: 8.3 mg/dL — AB (ref 8.9–10.3)
CHLORIDE: 102 mmol/L (ref 101–111)
CO2: 31 mmol/L (ref 22–32)
CREATININE: 1.34 mg/dL — AB (ref 0.61–1.24)
GFR calc Af Amer: 57 mL/min — ABNORMAL LOW (ref >60)
GFR calc non Af Amer: 49 mL/min — ABNORMAL LOW (ref >60)
GLUCOSE: 86 mg/dL (ref 65–99)
Potassium: 3.9 mmol/L (ref 3.5–5.1)
Sodium: 143 mmol/L (ref 135–145)

## 2017-05-19 LAB — CBC
HCT: 32.6 % — ABNORMAL LOW (ref 39.0–52.0)
HEMOGLOBIN: 10.9 g/dL — AB (ref 13.0–17.0)
MCH: 32.2 pg (ref 26.0–34.0)
MCHC: 33.4 g/dL (ref 30.0–36.0)
MCV: 96.4 fL (ref 78.0–100.0)
Platelets: 184 10*3/uL (ref 150–400)
RBC: 3.38 MIL/uL — AB (ref 4.22–5.81)
RDW: 14 % (ref 11.5–15.5)
WBC: 6.1 10*3/uL (ref 4.0–10.5)

## 2017-05-19 LAB — I-STAT TROPONIN, ED: TROPONIN I, POC: 0 ng/mL (ref 0.00–0.08)

## 2017-05-19 NOTE — ED Notes (Signed)
Patient transported to X-ray 

## 2017-05-19 NOTE — ED Provider Notes (Signed)
[facility] DEPT Provider Note   CSN: [id number] Arrival date & time: 05/19/17  1730     History   Chief Complaint Chief Complaint  Patient presents with  . Chest Pain    HPI [name] [name] is a 76 y.o. male.  Patient is here for evaluation of upper abdomen, and lower chest pain, present for 1 month intermittently.  Today it was bothering him more than usual so he decided to come here for evaluation.  He had a normal bowel movement earlier today and was able to eat normally.  He denies nausea, vomiting, fever, chills, cough, shortness of breath, weakness or dizziness.  He is taking his usual medications.  There are no other known modifying factors.  HPI  Past Medical History:  Diagnosis Date  . Allergic rhinitis, cause unspecified   . Arthritis    osteoarthritis  . Cancer ([place])    renal carcinoma  . COPD (chronic obstructive pulmonary disease) (HCC)    [name] FOLLOWS PT--PT USING OXYGEN 24 HRS A DAY  . COPD (chronic obstructive pulmonary disease) (HCC)    PULMONARY CLEARANCE FOR LEFT RENAL CRYOABLATION ON CHART FROM DR. CLANCE -07/26/11  . Coronary artery disease    CARDIAC CLEARANCE  OFFICE NOTE FOR LEFT CRYO ABLATION ON CHART FROM [name] -"RECENT STRESS TEST DID NOT SHOW ISCHEMIA" -"PRESUMED CAD DUE TO CORONARY CALCIFICATIONS"   . DEMENTIA    Alzheimer's, senile dementia  . Family history of ischemic heart disease   . Family history of malignant neoplasm of gastrointestinal tract   . GERD (gastroesophageal reflux disease)   . Hypothyroidism   . Myocardial infarction ([place])   . Obesity hypoventilation syndrome ([place])    failed cpap/bipap trial  . Other and unspecified hyperlipidemia   . Other congenital hamartoses, not elsewhere classified   . Pneumonia    HX     . Shortness of breath   . Sleep apnea    OXYGEN 24 HR/DAY-NO CPAP  . Unspecified essential hypertension     Patient Active Problem List   Diagnosis Date Noted  . GERD  (gastroesophageal reflux disease) 03/22/2017  . Anxiety 03/22/2017  . Hypomagnesemia 03/22/2017  . Chest pain 03/22/2017  . Musculoskeletal chest pain   . Bronchitis 10/10/2016  . CKD (chronic kidney disease) stage 3, GFR 30-59 ml/min 10/10/2016  . HTN (hypertension) 10/10/2016  . Hypothyroid 10/10/2016  . Chronic diastolic CHF (congestive heart failure) ([place]) 03/03/2016  . Coronary artery disease 03/03/2016  . Thoracic aortic aneurysm ([place]) 03/03/2016  . Dementia 02/23/2016  . COPD (chronic obstructive pulmonary disease) ([place]) 02/23/2016  . Oliguria 02/23/2016  . Atypical chest pain 02/23/2016  . Chronic respiratory failure with hypoxia and hypercapnia ([place]) 09/27/2015  . Morbid obesity ([place]) 07/13/2015  . Dyspnea on exertion 07/12/2015  . OSA (obstructive sleep apnea) 11/15/2011  . Renal mass, left 09/14/2011  . DISORDERS OF DIAPHRAGM 07/02/2010  . OBESITY HYPOVENTILATION SYNDROME 02/14/2009  . Multiple pulmonary nodules 02/14/2009  . CHOLELITHIASIS 05/04/2008  . Nonspecific (abnormal) findings on radiological and other examination of body structure 04/28/2008  . ABNORMAL CHEST XRAY 04/28/2008  . 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 Hypocalcemia 03/31/2008  . 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 DYSLIPIDEMIA 09/21/2007  . Hypertensive heart disease with CHF (congestive heart failure) ([place]) 09/21/2007  . Allergic rhinitis 09/21/2007    Past Surgical History:  Procedure Laterality Date  . CARPAL TUNNEL RELEASE     right  . CHOLECYSTECTOMY  2009  .  EYE SURGERY     bilateral cataracts  . JOINT REPLACEMENT     bilateral knee replacements  . KNEE ARTHROPLASTY     2005 LEFT   AND 2007 RIGHT--[name]  . THYROIDECTOMY    . TUMOR REMOVAL  09/14/2011       Home Medications    Prior to Admission medications   Medication Sig Start Date End Date Taking? Authorizing Provider  albuterol (PROVENTIL) (2.5 MG/3ML) 0.083%  nebulizer solution Take 2.5 mg by nebulization See admin instructions. Inhale one vial via handheld nebulizer twice daily - with ipratropium   Yes [provider]  calcitRIOL (ROCALTROL) 0.25 MCG capsule Take 1 capsule (0.25 mcg total) by mouth daily. Patient taking differently: Take 0.5 mcg by mouth daily.  03/26/17  Yes Barton Dubois, MD  Calcium Carb-Cholecalciferol (OYSCO 500+D PO) Take 2 tablets by mouth 4 (four) times daily. Before meals and at bedtime   Yes [provider]  Calcium Carbonate-Vitamin D (CALCIUM-D PO) Take 1 capsule by mouth 4 (four) times daily. 500-50   Yes [provider]  calcium-vitamin D (OSCAL WITH D) 500-200 MG-UNIT tablet Take 2 tablets by mouth 4 (four) times daily -  before meals and at bedtime. 03/25/17  Yes Barton Dubois, MD  cetirizine (ZYRTEC) 10 MG tablet Take 10 mg by mouth daily.   Yes [provider]  citalopram (CELEXA) 10 MG tablet Take 20 mg by mouth daily.    Yes [provider]  clopidogrel (PLAVIX) 75 MG tablet Take 75 mg by mouth daily.    Yes [provider]  cycloSPORINE (RESTASIS) 0.05 % ophthalmic emulsion Place 1 drop into both eyes 2 (two) times daily.   Yes [provider]  fluticasone (FLONASE) 50 MCG/ACT nasal spray Place 2 sprays into the nose daily as needed for allergies.    Yes [provider]  HYDROcodone-acetaminophen (NORCO/VICODIN) 5-325 MG tablet Take 1 tablet by mouth every 6 (six) hours as needed for moderate pain. Patient taking differently: Take 1 tablet by mouth every 6 (six) hours as needed for moderate pain. Do not exceed 3 grams/24 hours of tylenol from all sources 02/27/16  Yes Domenic Polite, MD  ipratropium (ATROVENT) 0.02 % nebulizer solution Take 0.5 mg by nebulization See admin instructions. Inhale one vial via handheld nebulizer twice daily - with albuterol   Yes [provider]  levothyroxine (SYNTHROID, LEVOTHROID) 200 MCG tablet Take 200 mcg  by mouth See admin instructions. Take 1 tablet (200 mcg) by mouth daily - with a 25 mcg tablet for a 225 mcg dose   Yes [provider]  levothyroxine (SYNTHROID, LEVOTHROID) 25 MCG tablet Take 25 mcg by mouth See admin instructions. Take 1 tablet (25 mcg) by mouth daily - with a 200 mcg tablet for a 225 mcg dose   Yes [provider]  loratadine (CLARITIN) 10 MG tablet Take 10 mg by mouth daily.   Yes [provider]  LORazepam (ATIVAN) 1 MG tablet Take 1 mg by mouth at bedtime.   Yes [provider]  Melatonin 3 MG TABS Take 3 mg by mouth at bedtime.    Yes [provider]  metoprolol tartrate (LOPRESSOR) 25 MG tablet Take 12.5 mg by mouth See admin instructions. Take 1/2 tablet (12.5 mg) by mouth twice daily (hold for sbp <100)   Yes [provider]  mometasone-formoterol (DULERA) 100-5 MCG/ACT AERO Inhale 2 puffs into the lungs 2 (two) times daily.   Yes [provider]  OXYGEN Inhale into the lungs continuous.    Yes [provider]  pantoprazole (PROTONIX) 20 MG tablet Take 1 tablet (20 mg total) by mouth daily. 03/25/17  Yes Barton Dubois, MD  polyethylene glycol powder (MIRALAX) powder Take 17 g by mouth 2 (two) times daily as needed (constipaiton). Patient taking differently: Take 17 g by mouth every other day. Mix in 8 oz glass of liquid and drink 03/25/17  Yes Barton Dubois, MD  ranitidine (ZANTAC) 150 MG tablet Take 150 mg by mouth daily.   Yes [provider]  Tamsulosin HCl (FLOMAX) 0.4 MG CAPS Take 0.4 mg by mouth daily after supper.    Yes [provider]  traZODone (DESYREL) 150 MG tablet Take 300 mg by mouth at bedtime.    Yes [provider]  calcium carbonate (TUMS) 500 MG chewable tablet Chew 2 tablets by mouth daily as needed for indigestion.     [provider]  diclofenac sodium (VOLTAREN) 1 % GEL Apply 2 g topically 2 (two) times daily as needed (pain).     [provider]  levofloxacin (LEVAQUIN) 500 MG tablet Take 1 tablet (500 mg total) by mouth daily. Patient not taking: Reported on 05/19/2017 04/11/17   Forde Dandy, MD  magnesium citrate SOLN Take 1 Bottle by mouth daily as needed for moderate constipation or severe constipation.    [provider]  nystatin cream (MYCOSTATIN) Apply 1 application topically 2 (two) times daily as needed for dry skin.    [provider]  sodium chloride (OCEAN) 0.65 % SOLN nasal spray Place 2 sprays into both nostrils 3 (three) times daily as needed for congestion (sinus symptoms).     [provider]    Family History Family History  Problem Relation Age of Onset  . Cancer Other        colon  . Heart disease Other        CAD, MI and Heart Attack    Social History Social History  Substance Use Topics  . Smoking status: Never Smoker  . Smokeless tobacco: Never Used  . Alcohol use No     Allergies   Patient has no known allergies.   Review of Systems Review of Systems  All other systems reviewed and are negative.    Physical Exam Updated Vital Signs BP (!) 114/47 (BP Location: Left Arm)   Pulse 64   Resp 20   SpO2 100%   Physical Exam  Constitutional: He is oriented to person, place, and time. He appears well-developed. No distress.  Elderly, frail  HENT:  Head: Normocephalic and atraumatic.  Right Ear: External ear normal.  Left Ear: External ear normal.  Eyes: Pupils are equal, round, and reactive to light. Conjunctivae and EOM are normal.  Neck: Normal range of motion and phonation normal. Neck supple.  Cardiovascular: Normal rate, regular rhythm and normal heart sounds.   Pulmonary/Chest: Effort normal and breath sounds normal. He exhibits no bony tenderness.  Abdominal: Soft. He exhibits no mass. There is no tenderness. There is no rebound and no guarding.  Musculoskeletal: Normal range of motion.  Neurological: He is alert and oriented to person,  place, and time. No cranial nerve deficit or sensory deficit. He exhibits normal muscle tone. Coordination normal.  Skin: Skin is warm, dry and intact.  Psychiatric: He has a normal mood and affect. His behavior is normal. Judgment and thought content normal.  Nursing note and vitals reviewed.    ED Treatments /  Results  Labs (all labs ordered are listed, but only abnormal results are displayed) Labs Reviewed  BASIC METABOLIC PANEL - Abnormal; Notable for the following:       Result Value   Creatinine, Ser 1.34 (*)    Calcium 8.3 (*)    GFR calc non Af Amer 49 (*)    GFR calc Af Amer 57 (*)    All other components within normal limits  CBC - Abnormal; Notable for the following:    RBC 3.38 (*)    Hemoglobin 10.9 (*)    HCT 32.6 (*)    All other components within normal limits  I-STAT TROPONIN, ED    EKG  EKG Interpretation None      Vent. rate 72 BPM PR interval * ms QRS duration 90 ms QT/QTc 409/448 ms P-R-T axes   Date: 05/19/17  Rate: 72  Rhythm: normal sinus rhythm  QRS Axis: normal  PR and QT Intervals: normal  ST/T Wave abnormalities: normal  PR and QRS Conduction Disutrbances:none  Narrative Interpretation:     Radiology Dg Chest 2 View  Result Date: 05/19/2017 CLINICAL DATA:  75 year old male with left-sided chest pain and cough. EXAM: CHEST  2 VIEW COMPARISON:  Chest CT dated 04/11/2017 FINDINGS: There are bibasilar atelectatic changes. No focal consolidation, pleural effusion, or pneumothorax. The cardiac silhouette is within normal limits with there is degenerative changes of the spine and shoulders. Right shoulder arthroplasty noted. IMPRESSION: No focal consolidation. Electronically Signed   By: Anner Crete M.D.   On: 05/19/2017 18:24    Procedures Procedures (including critical care time)  Medications Ordered in ED Medications - No data to display   Initial Impression / Assessment and Plan / ED Course  I have reviewed the triage vital  signs and the nursing notes.  Pertinent labs & imaging results that were available during my care of the patient were reviewed by me and considered in my medical decision making (see chart for details).      Patient Vitals for the past 24 hrs:  BP Pulse Resp SpO2  05/19/17 1947 (!) 114/47 64 20 100 %  05/19/17 1934 - - - 97 %  05/19/17 1744 - - - 98 %    8:10 PM Reevaluation with update and discussion. After initial assessment and treatment, an updated evaluation reveals no change in clinical status.  Findings discussed with the patient and all questions were answered. Shaynna Husby L    Final Clinical Impressions(s) / ED Diagnoses   Final diagnoses:  Nonspecific chest pain  Abdominal pain, unspecified abdominal location   Subacute pain abdomen and chest, reassuring evaluation in the ED.  Doubt ACS, PE or pneumonia.  Doubt metabolic instability or impending vascular collapse.  Nursing Notes Reviewed/ Care Coordinated Applicable Imaging Reviewed Interpretation of Laboratory Data incorporated into ED treatment  The patient appears reasonably screened and/or stabilized for discharge and I doubt any other medical condition or other [facility] requiring further screening, evaluation, or treatment in the ED at this time prior to discharge.  Plan: Home Medications-continue usual medications; Home Treatments-rest, gradually advance diet; return here if the recommended treatment, does not improve the symptoms; Recommended follow up-PCP, as needed   New Prescriptions New Prescriptions   No medications on file     Daleen Bo, MD 05/19/17 2028

## 2017-05-19 NOTE — ED Triage Notes (Signed)
Per [facility] patient comes from [facility] for chest pain that has been going on for couple weeks that is worse with movement, coughing, palpation.  Patient is on continuous O2 via n/c on 2L.  Patient has PMH COPD, PE, PNA and chronic cough.

## 2017-05-19 NOTE — Discharge Instructions (Signed)
The test results today were reassuring.  It is safe for you to go home now.  For continued pain, try taking Tylenol every 4 hours.  Try to eat a regular diet to make sure that you are stooling every day.  If you are not having a daily bowel movement, ask your caregiver to give you some MiraLAX. Return here, if needed, for problems.

## 2017-05-19 NOTE — ED Notes (Signed)
Called [facility] for transport back to [facility] spoke with Vivien Rota, tried to also speak with nurse Not available   Paperwork complete.

## 2017-06-03 ENCOUNTER — Emergency Department (HOSPITAL_COMMUNITY)
Admission: EM | Admit: 2017-06-03 | Discharge: 2017-06-03 | Disposition: A | Discharge status: Home / Self Care | Payer: Medicare Other | Attending: Emergency Medicine | Admitting: Emergency Medicine

## 2017-06-03 ENCOUNTER — Emergency Department (HOSPITAL_BASED_OUTPATIENT_CLINIC_OR_DEPARTMENT_OTHER)
Admit: 2017-06-03 | Discharge: 2017-06-03 | Disposition: A | Discharge status: Home / Self Care | Payer: Medicare Other | Attending: Emergency Medicine | Admitting: Emergency Medicine

## 2017-06-03 ENCOUNTER — Encounter (HOSPITAL_COMMUNITY): Payer: Self-pay

## 2017-06-03 DIAGNOSIS — I130 Hypertensive heart and chronic kidney disease with heart failure and stage 1 through stage 4 chronic kidney disease, or unspecified chronic kidney disease: Secondary | ICD-10-CM | POA: Insufficient documentation

## 2017-06-03 DIAGNOSIS — Z79899 Other long term (current) drug therapy: Secondary | ICD-10-CM | POA: Insufficient documentation

## 2017-06-03 DIAGNOSIS — M79609 Pain in unspecified limb: Secondary | ICD-10-CM

## 2017-06-03 DIAGNOSIS — N183 Chronic kidney disease, stage 3 (moderate): Secondary | ICD-10-CM | POA: Insufficient documentation

## 2017-06-03 DIAGNOSIS — Z96653 Presence of artificial knee joint, bilateral: Secondary | ICD-10-CM | POA: Diagnosis not present

## 2017-06-03 DIAGNOSIS — I2510 Atherosclerotic heart disease of native coronary artery without angina pectoris: Secondary | ICD-10-CM | POA: Insufficient documentation

## 2017-06-03 DIAGNOSIS — I509 Heart failure, unspecified: Secondary | ICD-10-CM | POA: Insufficient documentation

## 2017-06-03 DIAGNOSIS — M79601 Pain in right arm: Secondary | ICD-10-CM

## 2017-06-03 DIAGNOSIS — J449 Chronic obstructive pulmonary disease, unspecified: Secondary | ICD-10-CM | POA: Insufficient documentation

## 2017-06-03 DIAGNOSIS — E039 Hypothyroidism, unspecified: Secondary | ICD-10-CM | POA: Insufficient documentation

## 2017-06-03 MED ORDER — TRAMADOL HCL 50 MG PO TABS
50.0000 mg | ORAL_TABLET | Freq: Once | ORAL | Status: AC
  Administered 2017-06-03: 50 mg via ORAL
  Filled 2017-06-03: qty 1

## 2017-06-03 NOTE — ED Triage Notes (Signed)
Pt arrived via EMS from [facility]  Pt is alert and oriented x 4.  c/o Right arm pain x 1 week sharp that worsens with exertion. Pt had u/s last week to R/O blood clot. Pt is on Oxygen at home.    BP 128/64, HR 80, RR 20, O2 96% 2 lmp of O2 via [place]

## 2017-06-03 NOTE — ED Provider Notes (Signed)
[facility] DEPT Provider Note   CSN: [id number] Arrival date & time: 06/03/17  1615     History   Chief Complaint Chief Complaint  Patient presents with  . Arm Pain    HPI Kessler Solly Reichl is a 76 y.o. male.  The history is provided by the patient and medical records. No language interpreter was used.  Arm Pain   Jahlil Ziller [place]ington is a 76 y.o. male  with a PMH of dementia, COPD, CAD who presents to the Emergency Department complaining of right upper arm pain and swelling x 1 week. Pain is worse when he "hits it a certain way" and with certain movements. Denies known injury or trauma. No history of similar. Patient states that he had an ultrasound done on his RUE last week at [facility] facility where they told them that it was not a blood clot but did not tell him what the problem was. I called [facility] facility to obtain records of imaging, however they did not have any record of this ultrasound being done. No numbness, tingling, weakness.    Past Medical History:  Diagnosis Date  . Allergic rhinitis, cause unspecified   . Arthritis    osteoarthritis  . Cancer ([place])    renal carcinoma  . COPD (chronic obstructive pulmonary disease) (HCC)    [name] FOLLOWS PT--PT USING OXYGEN 24 HRS A DAY  . COPD (chronic obstructive pulmonary disease) (HCC)    PULMONARY CLEARANCE FOR LEFT RENAL CRYOABLATION ON CHART FROM DR. CLANCE -07/26/11  . Coronary artery disease    CARDIAC CLEARANCE  OFFICE NOTE FOR LEFT CRYO ABLATION ON CHART FROM [name] -"RECENT STRESS TEST DID NOT SHOW ISCHEMIA" -"PRESUMED CAD DUE TO CORONARY CALCIFICATIONS"   . DEMENTIA    Alzheimer's, senile dementia  . Family history of ischemic heart disease   . Family history of malignant neoplasm of gastrointestinal tract   . GERD (gastroesophageal reflux disease)   . Hypothyroidism   . Myocardial infarction ([place])   . Obesity hypoventilation syndrome ([place])    failed cpap/bipap trial  . Other and  unspecified hyperlipidemia   . Other congenital hamartoses, not elsewhere classified   . Pneumonia    HX     . Shortness of breath   . Sleep apnea    OXYGEN 24 HR/DAY-NO CPAP  . Unspecified essential hypertension     Patient Active Problem List   Diagnosis Date Noted  . GERD (gastroesophageal reflux disease) 03/22/2017  . Anxiety 03/22/2017  . Hypomagnesemia 03/22/2017  . Chest pain 03/22/2017  . Musculoskeletal chest pain   . Bronchitis 10/10/2016  . CKD (chronic kidney disease) stage 3, GFR 30-59 ml/min 10/10/2016  . HTN (hypertension) 10/10/2016  . Hypothyroid 10/10/2016  . Chronic diastolic CHF (congestive heart failure) ([place]) 03/03/2016  . Coronary artery disease 03/03/2016  . Thoracic aortic aneurysm ([place]) 03/03/2016  . Dementia 02/23/2016  . COPD (chronic obstructive pulmonary disease) ([place]) 02/23/2016  . Oliguria 02/23/2016  . Atypical chest pain 02/23/2016  . Chronic respiratory failure with hypoxia and hypercapnia ([place]) 09/27/2015  . Morbid obesity ([place]) 07/13/2015  . Dyspnea on exertion 07/12/2015  . OSA (obstructive sleep apnea) 11/15/2011  . Renal mass, left 09/14/2011  . DISORDERS OF DIAPHRAGM 07/02/2010  . OBESITY HYPOVENTILATION SYNDROME 02/14/2009  . Multiple pulmonary nodules 02/14/2009  . CHOLELITHIASIS 05/04/2008  . Nonspecific (abnormal) findings on radiological and other examination of body structure 04/28/2008  . ABNORMAL CHEST XRAY 04/28/2008  . 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 Hypocalcemia 03/31/2008  . 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 DYSLIPIDEMIA 09/21/2007  . Hypertensive heart disease with CHF (congestive heart failure) ([place]) 09/21/2007  . Allergic rhinitis 09/21/2007    Past Surgical History:  Procedure Laterality Date  . CARPAL TUNNEL RELEASE     right  . CHOLECYSTECTOMY  2009  . EYE SURGERY     bilateral cataracts  . JOINT REPLACEMENT     bilateral knee replacements  .  KNEE ARTHROPLASTY     2005 LEFT   AND 2007 RIGHT--[name]  . THYROIDECTOMY    . TUMOR REMOVAL  09/14/2011       Home Medications    Prior to Admission medications   Medication Sig Start Date End Date Taking? Authorizing Provider  albuterol (PROVENTIL) (2.5 MG/3ML) 0.083% nebulizer solution Take 2.5 mg by nebulization 3 (three) times daily. Inhale one vial via handheld nebulizer three times daily - with ipratropium   Yes [provider]  calcitRIOL (ROCALTROL) 0.25 MCG capsule Take 1 capsule (0.25 mcg total) by mouth daily. Patient taking differently: Take 0.5 mcg by mouth daily.  03/26/17  Yes Barton Dubois, MD  Calcium Carb-Cholecalciferol (OYSCO 500+D PO) Take 2 tablets by mouth 4 (four) times daily. Before meals and at bedtime   Yes [provider]  calcium carbonate (TUMS) 500 MG chewable tablet Chew 2 tablets by mouth daily as needed for indigestion.    Yes [provider]  cetirizine (ZYRTEC) 10 MG tablet Take 10 mg by mouth daily.   Yes [provider]  citalopram (CELEXA) 20 MG tablet Take 20 mg by mouth daily.    Yes [provider]  clopidogrel (PLAVIX) 75 MG tablet Take 75 mg by mouth daily.    Yes [provider]  cycloSPORINE (RESTASIS) 0.05 % ophthalmic emulsion Place 1 drop into both eyes 2 (two) times daily.   Yes [provider]  diclofenac sodium (VOLTAREN) 1 % GEL Apply 2 g topically 2 (two) times daily as needed (pain).    Yes [provider]  fluticasone (FLONASE) 50 MCG/ACT nasal spray Place 2 sprays into the nose daily as needed for allergies.    Yes [provider]  HYDROcodone-acetaminophen (NORCO/VICODIN) 5-325 MG tablet Take 1 tablet by mouth every 6 (six) hours as needed for moderate pain. Patient taking differently: Take 1 tablet by mouth every 6 (six) hours as needed for moderate pain. Do not exceed 3 grams/24 hours of tylenol from all sources 02/27/16  Yes Domenic Polite, MD    ipratropium (ATROVENT) 0.02 % nebulizer solution Take 0.5 mg by nebulization See admin instructions. Inhale one vial via handheld nebulizer twice daily - with albuterol   Yes [provider]  levothyroxine (SYNTHROID, LEVOTHROID) 200 MCG tablet Take 200 mcg by mouth See admin instructions. Take 1 tablet (200 mcg) by mouth daily - with a 25 mcg tablet for a 225 mcg dose   Yes [provider]  levothyroxine (SYNTHROID, LEVOTHROID) 25 MCG tablet Take 25 mcg by mouth See admin instructions. Take 1 tablet (25 mcg) by mouth daily - with a 200 mcg tablet for a 225 mcg dose   Yes [provider]  loratadine (CLARITIN) 10 MG tablet Take 10 mg by mouth daily.   Yes [provider]  LORazepam (ATIVAN) 1 MG tablet Take 1 mg by mouth at bedtime.   Yes [provider]  magnesium citrate SOLN Take 1 Bottle by mouth daily as needed for moderate  constipation or severe constipation.   Yes [provider]  Melatonin 3 MG TABS Take 3 mg by mouth at bedtime.    Yes [provider]  metoprolol tartrate (LOPRESSOR) 25 MG tablet Take 12.5 mg by mouth See admin instructions. Take 1/2 tablet (12.5 mg) by mouth twice daily (hold for sbp <100)   Yes [provider]  mometasone-formoterol (DULERA) 100-5 MCG/ACT AERO Inhale 2 puffs into the lungs 2 (two) times daily.   Yes [provider]  nystatin cream (MYCOSTATIN) Apply 1 application topically 2 (two) times daily as needed for dry skin.   Yes [provider]  OXYGEN Inhale into the lungs continuous.    Yes [provider]  pantoprazole (PROTONIX) 20 MG tablet Take 1 tablet (20 mg total) by mouth daily. 03/25/17  Yes Barton Dubois, MD  polyethylene glycol powder (MIRALAX) powder Take 17 g by mouth 2 (two) times daily as needed (constipaiton). Patient taking differently: Take 17 g by mouth every other day. Mix in 8 oz glass of liquid and drink 03/25/17  Yes Barton Dubois, MD  sodium  chloride (OCEAN) 0.65 % SOLN nasal spray Place 2 sprays into both nostrils 3 (three) times daily as needed for congestion (sinus symptoms).    Yes [provider]  Tamsulosin HCl (FLOMAX) 0.4 MG CAPS Take 0.4 mg by mouth daily after supper.    Yes [provider]  traZODone (DESYREL) 150 MG tablet Take 300 mg by mouth at bedtime.    Yes [provider]  calcium-vitamin D (OSCAL WITH D) 500-200 MG-UNIT tablet Take 2 tablets by mouth 4 (four) times daily -  before meals and at bedtime. Patient not taking: Reported on 06/03/2017 03/25/17   Barton Dubois, MD  levofloxacin (LEVAQUIN) 500 MG tablet Take 1 tablet (500 mg total) by mouth daily. Patient not taking: Reported on 05/19/2017 04/11/17   Forde Dandy, MD    Family History Family History  Problem Relation Age of Onset  . Cancer Other        colon  . Heart disease Other        CAD, MI and Heart Attack    Social History Social History  Substance Use Topics  . Smoking status: Never Smoker  . Smokeless tobacco: Never Used  . Alcohol use No     Allergies   Patient has no known allergies.   Review of Systems Review of Systems  Musculoskeletal: Positive for myalgias.  All other systems reviewed and are negative.    Physical Exam Updated Vital Signs BP 115/62 (BP Location: Left Arm)   Pulse 75   Temp 99.4 F (37.4 C) (Oral)   SpO2 100%   Physical Exam  Constitutional: He is oriented to person, place, and time. He appears well-developed and well-nourished. No distress.  HENT:  Head: Normocephalic and atraumatic.  Cardiovascular: Normal rate, regular rhythm and normal heart sounds.   No murmur heard. Pulmonary/Chest: Effort normal and breath sounds normal. No respiratory distress.  Abdominal: Soft. He exhibits no distension. There is no tenderness.  Musculoskeletal:       Arms: RUE with full ROM. 5/5 muscle strength including grip strength. Sensation intact. 2+ radial pulse. Good cap refill.     Neurological: He is alert and oriented to person, place, and time.  Skin: Skin is warm and dry.  Nursing note and vitals reviewed.    ED Treatments / Results  Labs (all labs ordered are listed, but only abnormal results are displayed) Labs  Reviewed - No data to display  EKG  EKG Interpretation None       Radiology No results found.  Procedures Procedures (including critical care time)  Medications Ordered in ED Medications  traMADol (ULTRAM) tablet 50 mg (50 mg Oral Given 06/03/17 1825)     Initial Impression / Assessment and Plan / ED Course  I have reviewed the triage vital signs and the nursing notes.  Pertinent labs & imaging results that were available during my care of the patient were reviewed by me and considered in my medical decision making (see chart for details).    Terrall Bley Sherod is a 76 y.o. male who presents to ED for right upper arm pain x 1 week. On exam, patient is afebrile, hemodynamically stable with full strength and sensation to RUE. NVI. Patient does have swelling to tricep area with no overlying erythema or warmth. Does not appear infectious. No joint involvement. RUE ultrasound obtained and negative. Will have patient follow up with PCP for further workup of swelling. All questions answered.   Patient discussed with Dr. Billy Fischer who agrees with treatment plan.   Final Clinical Impressions(s) / ED Diagnoses   Final diagnoses:  Right arm pain    New Prescriptions New Prescriptions   No medications on file     Avianah Pellman, Ozella Almond, PA-C 06/03/17 1846    Gareth Morgan, MD 06/04/17 [phone]

## 2017-06-03 NOTE — Progress Notes (Signed)
*  Preliminary Results* Right upper extremity venous duplex completed. Right upper extremity is negative for deep and superficial vein thrombosis.  06/03/2017 6:28 PM  Maudry Mayhew, BS, RVT, RDCS, RDMS

## 2017-06-03 NOTE — Discharge Instructions (Signed)
It was my pleasure taking care of you today!   You need to follow up with your primary care provider for further discussion of your arm swelling and potentially further imaging. It would be best if you can see a primary care provider this week.   Ice affected area for pain / swelling.   Return to ER for new or worsening symptoms, any additional concerns.

## 2017-06-03 NOTE — ED Notes (Signed)
PTAR called  

## 2017-06-11 ENCOUNTER — Emergency Department (HOSPITAL_COMMUNITY)
Admission: EM | Admit: 2017-06-11 | Discharge: 2017-06-12 | Disposition: A | Discharge status: Home / Self Care | Payer: Medicare Other | Attending: Emergency Medicine | Admitting: Emergency Medicine

## 2017-06-11 ENCOUNTER — Emergency Department (HOSPITAL_COMMUNITY): Payer: Medicare Other

## 2017-06-11 ENCOUNTER — Encounter (HOSPITAL_COMMUNITY): Payer: Self-pay | Admitting: Emergency Medicine

## 2017-06-11 DIAGNOSIS — R0989 Other specified symptoms and signs involving the circulatory and respiratory systems: Secondary | ICD-10-CM | POA: Diagnosis not present

## 2017-06-11 DIAGNOSIS — Y92129 Unspecified place in nursing home as the place of occurrence of the external cause: Secondary | ICD-10-CM | POA: Diagnosis not present

## 2017-06-11 DIAGNOSIS — W01198A Fall on same level from slipping, tripping and stumbling with subsequent striking against other object, initial encounter: Secondary | ICD-10-CM | POA: Insufficient documentation

## 2017-06-11 DIAGNOSIS — Y939 Activity, unspecified: Secondary | ICD-10-CM | POA: Insufficient documentation

## 2017-06-11 DIAGNOSIS — M25511 Pain in right shoulder: Secondary | ICD-10-CM | POA: Insufficient documentation

## 2017-06-11 DIAGNOSIS — W19XXXA Unspecified fall, initial encounter: Secondary | ICD-10-CM

## 2017-06-11 DIAGNOSIS — I2510 Atherosclerotic heart disease of native coronary artery without angina pectoris: Secondary | ICD-10-CM | POA: Diagnosis not present

## 2017-06-11 DIAGNOSIS — Z043 Encounter for examination and observation following other accident: Secondary | ICD-10-CM | POA: Diagnosis present

## 2017-06-11 DIAGNOSIS — R0602 Shortness of breath: Secondary | ICD-10-CM | POA: Diagnosis not present

## 2017-06-11 DIAGNOSIS — I252 Old myocardial infarction: Secondary | ICD-10-CM | POA: Diagnosis not present

## 2017-06-11 DIAGNOSIS — G309 Alzheimer's disease, unspecified: Secondary | ICD-10-CM | POA: Diagnosis not present

## 2017-06-11 DIAGNOSIS — Z85528 Personal history of other malignant neoplasm of kidney: Secondary | ICD-10-CM | POA: Insufficient documentation

## 2017-06-11 DIAGNOSIS — Z7901 Long term (current) use of anticoagulants: Secondary | ICD-10-CM | POA: Diagnosis not present

## 2017-06-11 DIAGNOSIS — Z79899 Other long term (current) drug therapy: Secondary | ICD-10-CM | POA: Diagnosis not present

## 2017-06-11 DIAGNOSIS — J449 Chronic obstructive pulmonary disease, unspecified: Secondary | ICD-10-CM | POA: Insufficient documentation

## 2017-06-11 DIAGNOSIS — R0781 Pleurodynia: Secondary | ICD-10-CM

## 2017-06-11 DIAGNOSIS — M79604 Pain in right leg: Secondary | ICD-10-CM | POA: Diagnosis not present

## 2017-06-11 DIAGNOSIS — Y999 Unspecified external cause status: Secondary | ICD-10-CM | POA: Insufficient documentation

## 2017-06-11 DIAGNOSIS — R0789 Other chest pain: Secondary | ICD-10-CM | POA: Insufficient documentation

## 2017-06-11 LAB — I-STAT TROPONIN, ED: Troponin i, poc: 0 ng/mL (ref 0.00–0.08)

## 2017-06-11 LAB — BASIC METABOLIC PANEL
ANION GAP: 9 (ref 5–15)
BUN: 19 mg/dL (ref 6–20)
CO2: 27 mmol/L (ref 22–32)
Calcium: 8.3 mg/dL — ABNORMAL LOW (ref 8.9–10.3)
Chloride: 103 mmol/L (ref 101–111)
Creatinine, Ser: 1.16 mg/dL (ref 0.61–1.24)
GFR, EST NON AFRICAN AMERICAN: 59 mL/min — AB (ref >60)
Glucose, Bld: 88 mg/dL (ref 65–99)
POTASSIUM: 3.9 mmol/L (ref 3.5–5.1)
SODIUM: 139 mmol/L (ref 135–145)

## 2017-06-11 LAB — CBC
HEMATOCRIT: 34.8 % — AB (ref 39.0–52.0)
Hemoglobin: 11.6 g/dL — ABNORMAL LOW (ref 13.0–17.0)
MCH: 31.5 pg (ref 26.0–34.0)
MCHC: 33.3 g/dL (ref 30.0–36.0)
MCV: 94.6 fL (ref 78.0–100.0)
PLATELETS: 169 10*3/uL (ref 150–400)
RBC: 3.68 MIL/uL — ABNORMAL LOW (ref 4.22–5.81)
RDW: 13.3 % (ref 11.5–15.5)
WBC: 7.1 10*3/uL (ref 4.0–10.5)

## 2017-06-11 LAB — URINALYSIS, ROUTINE W REFLEX MICROSCOPIC
BILIRUBIN URINE: NEGATIVE
GLUCOSE, UA: NEGATIVE mg/dL
HGB URINE DIPSTICK: NEGATIVE
Ketones, ur: NEGATIVE mg/dL
Leukocytes, UA: NEGATIVE
Nitrite: NEGATIVE
Protein, ur: NEGATIVE mg/dL
SPECIFIC GRAVITY, URINE: 1.013 (ref 1.005–1.030)
pH: 9 — ABNORMAL HIGH (ref 5.0–8.0)

## 2017-06-11 MED ORDER — TAMSULOSIN HCL 0.4 MG PO CAPS: 0.4000 mg | ORAL_CAPSULE | Freq: Every day | ORAL | Status: DC

## 2017-06-11 MED ORDER — ACETAMINOPHEN 325 MG PO TABS
650.0000 mg | ORAL_TABLET | Freq: Once | ORAL | Status: AC
  Administered 2017-06-11: 650 mg via ORAL
  Filled 2017-06-11: qty 2

## 2017-06-11 MED ORDER — METOPROLOL TARTRATE 25 MG PO TABS: 12.5000 mg | ORAL_TABLET | ORAL | Status: DC

## 2017-06-11 MED ORDER — IPRATROPIUM BROMIDE 0.02 % IN SOLN
0.5000 mg | Freq: Once | RESPIRATORY_TRACT | Status: AC
  Administered 2017-06-11: 0.5 mg via RESPIRATORY_TRACT
  Filled 2017-06-11: qty 2.5

## 2017-06-11 MED ORDER — ALBUTEROL SULFATE (2.5 MG/3ML) 0.083% IN NEBU
5.0000 mg | INHALATION_SOLUTION | Freq: Once | RESPIRATORY_TRACT | Status: AC
  Administered 2017-06-11: 5 mg via RESPIRATORY_TRACT
  Filled 2017-06-11: qty 6

## 2017-06-11 NOTE — ED Provider Notes (Signed)
[facility] DEPT Provider Note   CSN: [id number] Arrival date & time: 06/11/17  1542     History   Chief Complaint Chief Complaint  Patient presents with  . Fall  . Shortness of Breath    HPI [name] [name] is a 76 y.o. male.  HPI Patient is a 76 year old male with a past history of COPD, HFpEF, hypoparathyroidism, and on Plavix presenting post a fall at his nursing facility, [facility]. Patient reports he was transferring from a chair to a walker seat when he slipped to the floor. Patient reports that the right shoulder, right arm, right ribs are painful postinjury. Patient rates his pain as approximately 4 or 5 out of 10. Patient reports feeling dizzy prior to this event however he did not lose consciousness prior to the fall. Patient is on Plavix. Patient reports he did not hit his head in the course of the fall. Patient reports this pain is approximately 4-5 out of 10. Patient is also reporting that some bilateral upper abdominal pain was exacerbated by the fall. This is a pain he has been having intermittently for about 2-3 weeks that is exacerbated by breathing or movement. Patient reports that it is colicky in nature. Patient reports the pain is not exacerbated by eating. Patient reports some periodic nausea but no vomiting, diarrhea, melena, hematochezia., dysuria.   Patient reports he chronically has breathing difficulties although his breathing is a  little worse recently. Patient is on 2 L of oxygen at home. Patient reports he has to sit upright in a hospital bed to sleep, however nothing has changed about his positioning. Patient reports he has slightly worsening lower extremity edema however his lower extremities are chronically edematous. Patient reports a nonproductive cough that is slightly worse than baseline. Past Medical History:  Diagnosis Date  . Allergic rhinitis, cause unspecified   . Arthritis    osteoarthritis  . Cancer ([place])    renal carcinoma  . COPD  (chronic obstructive pulmonary disease) (HCC)    [name] FOLLOWS PT--PT USING OXYGEN 24 HRS A DAY  . COPD (chronic obstructive pulmonary disease) (HCC)    PULMONARY CLEARANCE FOR LEFT RENAL CRYOABLATION ON CHART FROM DR. CLANCE -07/26/11  . Coronary artery disease    CARDIAC CLEARANCE  OFFICE NOTE FOR LEFT CRYO ABLATION ON CHART FROM [name] -"RECENT STRESS TEST DID NOT SHOW ISCHEMIA" -"PRESUMED CAD DUE TO CORONARY CALCIFICATIONS"   . DEMENTIA    Alzheimer's, senile dementia  . Family history of ischemic heart disease   . Family history of malignant neoplasm of gastrointestinal tract   . GERD (gastroesophageal reflux disease)   . Hypothyroidism   . Myocardial infarction ([place])   . Obesity hypoventilation syndrome ([place])    failed cpap/bipap trial  . Other and unspecified hyperlipidemia   . Other congenital hamartoses, not elsewhere classified   . Pneumonia    HX     . Shortness of breath   . Sleep apnea    OXYGEN 24 HR/DAY-NO CPAP  . Unspecified essential hypertension     Patient Active Problem List   Diagnosis Date Noted  . GERD (gastroesophageal reflux disease) 03/22/2017  . Anxiety 03/22/2017  . Hypomagnesemia 03/22/2017  . Chest pain 03/22/2017  . Musculoskeletal chest pain   . Bronchitis 10/10/2016  . CKD (chronic kidney disease) stage 3, GFR 30-59 ml/min 10/10/2016  . HTN (hypertension) 10/10/2016  . Hypothyroid 10/10/2016  . Chronic diastolic CHF (congestive heart failure) ([place]) 03/03/2016  . Coronary artery  disease 03/03/2016  . Thoracic aortic aneurysm ([place]) 03/03/2016  . Dementia 02/23/2016  . COPD (chronic obstructive pulmonary disease) ([place]) 02/23/2016  . Oliguria 02/23/2016  . Atypical chest pain 02/23/2016  . Chronic respiratory failure with hypoxia and hypercapnia ([place]) 09/27/2015  . Morbid obesity ([place]) 07/13/2015  . Dyspnea on exertion 07/12/2015  . OSA (obstructive sleep apnea) 11/15/2011  . Renal mass, left 09/14/2011  . DISORDERS OF  DIAPHRAGM 07/02/2010  . OBESITY HYPOVENTILATION SYNDROME 02/14/2009  . Multiple pulmonary nodules 02/14/2009  . CHOLELITHIASIS 05/04/2008  . Nonspecific (abnormal) findings on radiological and other examination of body structure 04/28/2008  . ABNORMAL CHEST XRAY 04/28/2008  . 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 Hypoparathyroidism ([place]) 03/31/2008  . Hypocalcemia 03/31/2008  . 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 DYSLIPIDEMIA 09/21/2007  . Hypertensive heart disease with CHF (congestive heart failure) ([place]) 09/21/2007  . Allergic rhinitis 09/21/2007    Past Surgical History:  Procedure Laterality Date  . CARPAL TUNNEL RELEASE     right  . CHOLECYSTECTOMY  2009  . EYE SURGERY     bilateral cataracts  . JOINT REPLACEMENT     bilateral knee replacements  . KNEE ARTHROPLASTY     2005 LEFT   AND 2007 RIGHT--[name]  . THYROIDECTOMY    . TUMOR REMOVAL  09/14/2011       Home Medications    Prior to Admission medications   Medication Sig Start Date End Date Taking? Authorizing Provider  albuterol (PROVENTIL) (2.5 MG/3ML) 0.083% nebulizer solution Take 2.5 mg by nebulization 3 (three) times daily. Inhale one vial via handheld nebulizer three times daily - with ipratropium    [provider]  calcitRIOL (ROCALTROL) 0.25 MCG capsule Take 1 capsule (0.25 mcg total) by mouth daily. Patient taking differently: Take 0.5 mcg by mouth daily.  03/26/17   Barton Dubois, MD  Calcium Carb-Cholecalciferol (OYSCO 500+D PO) Take 2 tablets by mouth 4 (four) times daily. Before meals and at bedtime    [provider]  calcium carbonate (TUMS) 500 MG chewable tablet Chew 2 tablets by mouth daily as needed for indigestion.     [provider]  calcium-vitamin D (OSCAL WITH D) 500-200 MG-UNIT tablet Take 2 tablets by mouth 4 (four) times daily -  before meals and at bedtime. Patient not taking: Reported on 06/03/2017 03/25/17   Barton Dubois, MD  cetirizine  (ZYRTEC) 10 MG tablet Take 10 mg by mouth daily.    [provider]  citalopram (CELEXA) 20 MG tablet Take 20 mg by mouth daily.     [provider]  clopidogrel (PLAVIX) 75 MG tablet Take 75 mg by mouth daily.     [provider]  cycloSPORINE (RESTASIS) 0.05 % ophthalmic emulsion Place 1 drop into both eyes 2 (two) times daily.    [provider]  diclofenac sodium (VOLTAREN) 1 % GEL Apply 2 g topically 2 (two) times daily as needed (pain).     [provider]  fluticasone (FLONASE) 50 MCG/ACT nasal spray Place 2 sprays into the nose daily as needed for allergies.     [provider]  HYDROcodone-acetaminophen (NORCO/VICODIN) 5-325 MG tablet Take 1 tablet by mouth every 6 (six) hours as needed for moderate pain. Patient taking differently: Take 1 tablet by mouth every 6 (six) hours as needed for moderate pain. Do not exceed 3 grams/24 hours of tylenol from all sources 02/27/16   Domenic Polite, MD  ipratropium (ATROVENT) 0.02 % nebulizer solution Take 0.5 mg  by nebulization See admin instructions. Inhale one vial via handheld nebulizer twice daily - with albuterol    [provider]  levofloxacin (LEVAQUIN) 500 MG tablet Take 1 tablet (500 mg total) by mouth daily. Patient not taking: Reported on 05/19/2017 04/11/17   Forde Dandy, MD  levothyroxine (SYNTHROID, LEVOTHROID) 200 MCG tablet Take 200 mcg by mouth See admin instructions. Take 1 tablet (200 mcg) by mouth daily - with a 25 mcg tablet for a 225 mcg dose    [provider]  levothyroxine (SYNTHROID, LEVOTHROID) 25 MCG tablet Take 25 mcg by mouth See admin instructions. Take 1 tablet (25 mcg) by mouth daily - with a 200 mcg tablet for a 225 mcg dose    [provider]  loratadine (CLARITIN) 10 MG tablet Take 10 mg by mouth daily.    [provider]  LORazepam (ATIVAN) 1 MG tablet Take 1 mg by mouth at bedtime.    [provider]  magnesium citrate  SOLN Take 1 Bottle by mouth daily as needed for moderate constipation or severe constipation.    [provider]  Melatonin 3 MG TABS Take 3 mg by mouth at bedtime.     [provider]  metoprolol tartrate (LOPRESSOR) 25 MG tablet Take 12.5 mg by mouth See admin instructions. Take 1/2 tablet (12.5 mg) by mouth twice daily (hold for sbp <100)    [provider]  mometasone-formoterol (DULERA) 100-5 MCG/ACT AERO Inhale 2 puffs into the lungs 2 (two) times daily.    [provider]  nystatin cream (MYCOSTATIN) Apply 1 application topically 2 (two) times daily as needed for dry skin.    [provider]  OXYGEN Inhale into the lungs continuous.     [provider]  pantoprazole (PROTONIX) 20 MG tablet Take 1 tablet (20 mg total) by mouth daily. 03/25/17   Barton Dubois, MD  polyethylene glycol powder (MIRALAX) powder Take 17 g by mouth 2 (two) times daily as needed (constipaiton). Patient taking differently: Take 17 g by mouth every other day. Mix in 8 oz glass of liquid and drink 03/25/17   Barton Dubois, MD  sodium chloride (OCEAN) 0.65 % SOLN nasal spray Place 2 sprays into both nostrils 3 (three) times daily as needed for congestion (sinus symptoms).     [provider]  Tamsulosin HCl (FLOMAX) 0.4 MG CAPS Take 0.4 mg by mouth daily after supper.     [provider]  traZODone (DESYREL) 150 MG tablet Take 300 mg by mouth at bedtime.     [provider]    Family History Family History  Problem Relation Age of Onset  . Cancer Other        colon  . Heart disease Other        CAD, MI and Heart Attack    Social History Social History  Substance Use Topics  . Smoking status: Never Smoker  . Smokeless tobacco: Never Used  . Alcohol use No     Allergies   Patient has no known allergies.   Review of Systems Review of Systems  Constitutional: Negative for chills, fatigue and fever.  HENT: Positive for  congestion and rhinorrhea.   Eyes: Negative for visual disturbance.       No diplopia, no blurry vision.  Respiratory: Positive for cough and shortness of breath. Negative for chest tightness and wheezing.   Cardiovascular: Positive for leg swelling. Negative for chest pain and palpitations.  Gastrointestinal: Positive for abdominal  pain. Negative for blood in stool, constipation, diarrhea, nausea and vomiting.  Genitourinary: Negative for difficulty urinating, dysuria, flank pain and urgency.  Musculoskeletal: Positive for arthralgias and back pain. Negative for myalgias.  Skin: Negative for color change and rash.  Neurological: Positive for dizziness and headaches. Negative for syncope, weakness, light-headedness and numbness.     Physical Exam Updated Vital Signs BP 139/82   Pulse 74   Temp 97.9 F (36.6 C) (Oral)   Resp (!) 32   SpO2 98%   Physical Exam  Constitutional: He appears well-developed and well-nourished.  Patient anxious.  HENT:  Head: Normocephalic and atraumatic.  Mouth/Throat: Oropharynx is clear and moist.  Eyes: Pupils are equal, round, and reactive to light. Conjunctivae and EOM are normal.  Neck: Normal range of motion. Neck supple.  Cardiovascular: Normal rate, regular rhythm, S1 normal and S2 normal.   No murmur heard. Distant heart sounds.  Pulmonary/Chest: Effort normal. He has no wheezes. He has rales.  Rales in bilateral lower lung fields.  Abdominal: Soft. Bowel sounds are normal. He exhibits no distension. There is tenderness. There is no guarding.  Patient uncomfortable to palpation across the upper abdomen. This is most prominent while patient is speaking and engaging the abdominal muscles while being examined.  No rebound tenderness. No ecchymosis, rash, surgical scar noted over the abdomen.  Musculoskeletal: Normal range of motion. He exhibits no edema or deformity.  No point tenderness over right clavicle, acromioclavicular joint, humeral  head, or humerus. Patient uncomfortable throughout this exam. No bruising or deformity of right shoulder or upper extremity. Patient has chronic soft tissue swelling of right triceps region, which he discussed has been evaluated by primary care. Cervical, thoracic, lumbar paraspinal muscular tenderness however no midline tenderness. Discomfort to palpation over bilateral greater trochanters. No pain to palpation of the anterior posterior hip bilaterally.  Lymphadenopathy:    He has no cervical adenopathy.  Neurological: He is alert.  Cranial nerves grossly intact. Full active range of motion of upper and lower extremities.  Skin: Skin is warm and dry. No rash noted. No erythema.  Psychiatric: He has a normal mood and affect. His behavior is normal. Judgment and thought content normal.  Nursing note and vitals reviewed.    ED Treatments / Results  Labs (all labs ordered are listed, but only abnormal results are displayed) Labs Reviewed  BASIC METABOLIC PANEL  CBC  I-STAT TROPONIN, ED    EKG  EKG Interpretation  Date/Time:  Wednesday June 11 2017 16:12:47 EDT Ventricular Rate:  72 PR Interval:    QRS Duration: 91 QT Interval:  411 QTC Calculation: 450 R Axis:   10 Text Interpretation:  Sinus rhythm Low voltage, extremity and precordial leads No acute changes no sig Confirmed by Varney Biles [phone]) on 06/11/2017 4:30:12 PM       Radiology No results found.  Procedures Procedures (including critical care time)  Medications Ordered in ED Medications - No data to display   Initial Impression / Assessment and Plan / ED Course  I have reviewed the triage vital signs and the nursing notes.  Pertinent labs & imaging results that were available during my care of the patient were reviewed by me and considered in my medical decision making (see chart for details).  Clinical Course as of Jun 11 2318  Wed Jun 11, 2017  1636 Patient initially seen and evaluated. Patient  quite anxious at this time.  DG Chest 2 View [AM]  1945 Patient reevaluated. Pt  reports that he is having pain in his ribs with breathing. Patient reports that the breathing treatment did not help as it caused more pain to breathe deeply.  [AM]  1948 DG Ribs Unilateral Right [AM]  2131 Patient seen and reevaluated. Patient reports that his pain in his ribs is currently about a 5 out of 10 and that he is "sore". Repeat abdominal exam performed, and mild tenderness to palpation in upper abdomen but less acute than prior. Patient requested [place] up on the side of the bed. Nurse tech notified.   [AM]    Clinical Course User Index [AM] Albesa Seen, PA-C    Final Clinical Impressions(s) / ED Diagnoses   Final diagnoses:  None   MDM  Patient is a 76 year old male with a past history of COPD, HFpEF, hypoparathyroidism, and on Plavix presenting after a fall at his assisted living facility, [facility], while transferring from chair to walker seat. Patient reports feeling slightly dizzy prior to this fall, however no concerning features for syncope. Patient noted to be tender right shoulder over area of previous arthroplasty. Bilateral rib tenderness noted to palpation. X-rays of right shoulder, right humerus, ribs demonstrate no fracture or soft tissue swelling demonstrative of injury. Patient has had multiple presentations to the ED for fall. There is a concern the patient may require higher level of care. Case management consulted during patient visit and made recommendations for patient. A face-to-face consultation was placed. Patient understands and is in agreement of plan of care.   Patient was significantly anxious on presentation and reported shortness of breath. Patient remained anxious throughout the encounter this evening. Due breathing difficulty, patient received a cardiac workup. Additionally, patient had rales, lower extremity pitting edema reportedly worse than usual on exam. Troponin  negative. EKG, reviewed by me, shows low voltage in normal sinus rhythm with no evidence of ischemia, infarction, or arrhythmia and is consistent with prior. Patient did not respond to nebulizer treatment as he said deep breathing hurt his ribs. Chest x-ray, reviewed by me, showed bibasilar atelectasis but no significant pulmonary edema or pulmonary vascular congestion. Pain with breathing likely due to rib discomfort and not any acute pulmonary or cardiac pathology. Vital signs throughout the evening reviewed and show consistent tachypnea. This is likely due to shallow breathing and patient's significant anxiety.  No evidence that this is a COPD or CHF exacerbation. Patient on 2L of O2 at home.  While patient did not have signs of acute surgical abdominal pain during encounter, patient was exclaiming in pain throughout the evening, concerning to providers and staff. Patient given Tylenol for pain control, which did provide some relief. On reevaluation it was decided to proceed with CT scan non-contrast of the abdomen and pelvis, which showed left nonobstructive renal calculus, and a periumbilical hernia with no evidence of obstruction or incarceration, but no acute processes.  This is a shared visit with Dr. Kathrynn Humble. Patient was independently evaluated by Dr. [name] Sours. Dr. Kathrynn Humble consulted in evaluation, ordering, and discharge management.  New Prescriptions New Prescriptions   No medications on file     Tamala Julian 06/12/17 0040    Varney Biles, MD 06/12/17 1538

## 2017-06-11 NOTE — ED Triage Notes (Signed)
Per EMS pt from [facility] sent for evaluation post fall into wheelchair with attempt to transfer from bed to wheelchair; pt complaint of right ribcage, right arm, and right leg pain.

## 2017-06-11 NOTE — ED Notes (Addendum)
In addition to triage note, pt breathing labored, worse with laying flat, chest heaviness, malaise, crackles to left lung. Pt reports increased cough and sputum production intermittently x 2 weeks. Hx COPD.

## 2017-06-11 NOTE — ED Notes (Signed)
Bed: JI96 Expected date:  Expected time:  Means of arrival:  Comments: EMS fall blood thinners

## 2017-06-11 NOTE — Discharge Instructions (Signed)
We evaluated your shoulder, right arm, and ribs and there was no fracture or soft tissue swelling indicating injury. You also were having significant belly pain while here in the department so we got a scan of your belly given the amount of pain you were having. We would like you to follow up with your primary care provider for this upper belly and rib pain you are having while breathing. Please come back for care if you develop any severely worsening pain, shortness of breath, chest pain, nonresolving belly pain, or numbness or tingling in the arm you fell on.  As you may take Tylenol 650 mg every 6 hours as needed to help control your pain.  We also ordered a Social Work consultation in your home called a "Face to Face" evaluation where nursing, physical therapy, and occupational therapy will evaluate you in your home to establish if you need more day to day care.   Thank you for allowing [place] to participate in your care today.

## 2017-06-11 NOTE — Care Management Note (Signed)
Case Management Note  Patient Details  Name: [name] [name] MRN: [id number] Date of Birth: Jun 25, 1941  Subjective/Objective:   76 year old male from [place] with c/o fall today. CM noted 7 ED visits in the last 6 months.                Action/Plan: CM spoke with pt who states he lives in independent living and see's "young guy" through Dr. Irwin Brakeman office.  [facility] and confirmed pt lived in [place] not independent and that Lesia Hausen, [place] was his PCP. Was also advised pt has PT and OT services through [facility]. Contacted [facility] rep, [name] who stated pt had therapies only through [facility] but not active HHS orders.  Also dvised pt has no POA and no family. Discussed with Dr Kathrynn Humble CM thoughts of need for RN, PT, OT, and SW on D/C to assess need for increased level of care and DME needs. Pt is still in process and has potential for admission, so CM will continue to follow, updated Mariann Laster, CM.  [facility] rep, Colman Cater, will continue to follow too.  Expected Discharge Date:    Unknown              Expected Discharge Plan:  Assisted Living / Rest Home [facility]F with outpt PT/OT at [facility] per [facility] rep, [name])  Discharge planning Services  CM Consult  Post Acute Care Choice:   [facility] Choice offered to:  Patient  Status of Service:  In process, will continue to follow  Corky Crafts, RN 06/11/2017, 5:20 PM

## 2017-06-11 NOTE — ED Notes (Signed)
[facility] has been called to transport pt back to [facility]

## 2017-06-12 NOTE — Care Management Note (Signed)
Case Management Note  Patient Details  Name: [name] [name] MRN: [id number] Date of Birth: 08-May-1941  Action/Plan: CM noted today that pt was D/C back to [facility]F.  Confirmed with [name], [facility] rep, that she had received the HHS orders last night.  No further CM needs noted at this time.  Expected Discharge Date:   06/11/2017               Expected Discharge Plan:  Assisted Living / [facility] Home [facility]F with outpt PT/OT at [facility] per [facility] rep, [name])  Discharge planning Services  CM Consult  Post Acute Care Choice:  Home Health Choice offered to:  Patient  HH Arranged:  RN, PT, OT, Social Work [facility] Agency:  [facility]  Status of Service:  Completed, signed off  Corky Crafts, RN 06/12/2017, 2:20 PM

## 2017-09-04 ENCOUNTER — Other Ambulatory Visit: Payer: Self-pay

## 2017-09-04 DIAGNOSIS — R2231 Localized swelling, mass and lump, right upper limb: Secondary | ICD-10-CM

## 2017-10-03 ENCOUNTER — Emergency Department (HOSPITAL_COMMUNITY)
Admission: EM | Admit: 2017-10-03 | Discharge: 2017-10-03 | Disposition: A | Discharge status: Home / Self Care | Payer: Medicare Other | Attending: Emergency Medicine | Admitting: Emergency Medicine

## 2017-10-03 ENCOUNTER — Encounter (HOSPITAL_COMMUNITY): Payer: Self-pay | Admitting: Emergency Medicine

## 2017-10-03 ENCOUNTER — Emergency Department (HOSPITAL_COMMUNITY): Payer: Medicare Other

## 2017-10-03 ENCOUNTER — Other Ambulatory Visit: Payer: Self-pay

## 2017-10-03 DIAGNOSIS — Z85528 Personal history of other malignant neoplasm of kidney: Secondary | ICD-10-CM | POA: Insufficient documentation

## 2017-10-03 DIAGNOSIS — I130 Hypertensive heart and chronic kidney disease with heart failure and stage 1 through stage 4 chronic kidney disease, or unspecified chronic kidney disease: Secondary | ICD-10-CM | POA: Diagnosis not present

## 2017-10-03 DIAGNOSIS — Z79899 Other long term (current) drug therapy: Secondary | ICD-10-CM | POA: Diagnosis not present

## 2017-10-03 DIAGNOSIS — I252 Old myocardial infarction: Secondary | ICD-10-CM | POA: Insufficient documentation

## 2017-10-03 DIAGNOSIS — I5032 Chronic diastolic (congestive) heart failure: Secondary | ICD-10-CM | POA: Diagnosis not present

## 2017-10-03 DIAGNOSIS — R079 Chest pain, unspecified: Secondary | ICD-10-CM | POA: Diagnosis not present

## 2017-10-03 DIAGNOSIS — Z96652 Presence of left artificial knee joint: Secondary | ICD-10-CM | POA: Insufficient documentation

## 2017-10-03 DIAGNOSIS — J449 Chronic obstructive pulmonary disease, unspecified: Secondary | ICD-10-CM | POA: Insufficient documentation

## 2017-10-03 DIAGNOSIS — E039 Hypothyroidism, unspecified: Secondary | ICD-10-CM | POA: Insufficient documentation

## 2017-10-03 DIAGNOSIS — N183 Chronic kidney disease, stage 3 (moderate): Secondary | ICD-10-CM | POA: Insufficient documentation

## 2017-10-03 DIAGNOSIS — Z7902 Long term (current) use of antithrombotics/antiplatelets: Secondary | ICD-10-CM | POA: Diagnosis not present

## 2017-10-03 DIAGNOSIS — G309 Alzheimer's disease, unspecified: Secondary | ICD-10-CM | POA: Diagnosis not present

## 2017-10-03 DIAGNOSIS — I2510 Atherosclerotic heart disease of native coronary artery without angina pectoris: Secondary | ICD-10-CM | POA: Diagnosis not present

## 2017-10-03 DIAGNOSIS — Z96651 Presence of right artificial knee joint: Secondary | ICD-10-CM | POA: Insufficient documentation

## 2017-10-03 LAB — BASIC METABOLIC PANEL
ANION GAP: 8 (ref 5–15)
BUN: 23 mg/dL — ABNORMAL HIGH (ref 6–20)
CHLORIDE: 99 mmol/L — AB (ref 101–111)
CO2: 32 mmol/L (ref 22–32)
Calcium: 9.4 mg/dL (ref 8.9–10.3)
Creatinine, Ser: 1.45 mg/dL — ABNORMAL HIGH (ref 0.61–1.24)
GFR calc Af Amer: 52 mL/min — ABNORMAL LOW (ref >60)
GFR, EST NON AFRICAN AMERICAN: 45 mL/min — AB (ref >60)
GLUCOSE: 135 mg/dL — AB (ref 65–99)
POTASSIUM: 4.4 mmol/L (ref 3.5–5.1)
Sodium: 139 mmol/L (ref 135–145)

## 2017-10-03 LAB — CBC
HEMATOCRIT: 35.4 % — AB (ref 39.0–52.0)
HEMOGLOBIN: 11.2 g/dL — AB (ref 13.0–17.0)
MCH: 32.1 pg (ref 26.0–34.0)
MCHC: 31.6 g/dL (ref 30.0–36.0)
MCV: 101.4 fL — AB (ref 78.0–100.0)
Platelets: 154 10*3/uL (ref 150–400)
RBC: 3.49 MIL/uL — ABNORMAL LOW (ref 4.22–5.81)
RDW: 13.4 % (ref 11.5–15.5)
WBC: 5.6 10*3/uL (ref 4.0–10.5)

## 2017-10-03 LAB — I-STAT TROPONIN, ED: Troponin i, poc: 0 ng/mL (ref 0.00–0.08)

## 2017-10-03 NOTE — ED Notes (Signed)
Pt wheeled to restroom by this RN

## 2017-10-03 NOTE — ED Notes (Signed)
[facility] called for transport.  

## 2017-10-03 NOTE — ED Provider Notes (Signed)
[facility] EMERGENCY DEPARTMENT Provider Note   CSN: [id number] Arrival date & time: 10/03/17  1800     History   Chief Complaint Chief Complaint  Patient presents with  . Chest Pain    HPI [name] [name] is a 76 y.o. male with a history of COPD, CAD, hypertension, hypothyroidism dementia, hypothyroidism who presents the emergency department today for chest pain.  Patient notes over the last 1.5 days he has been having left-sided chest pain without radiation into the neck, jaw, back or other shoulders.  He notes that the pain is brought on at rest intermittently.  Is happened approximately 5 times since yesterday.  He does note he has baseline shortness of breath due to COPD and is unsure if he has any shortness of breath associated. No current SOB.  He denies any nausea with episodes.  Chest pain episodes last less than 1 minute.  Pain is worsened with palpation.  Last episode approximately 2 hours ago.  No current chest pain.  He has not tried anything for this.  He was given 324 mg aspirin by EMS in route.  No use of nitroglycerin.  The pain is not exertional, positional or pleuritic in nature.  Patient denies any burping or belching.  He denies any increased shortness of breath or change in cough.  No fevers at home. Last echocardiogram on 02/24/2016 showed ejection fraction of 55-60%, with no wall motion abnormalities.  There was grade 1 diastolic dysfunction.  Documented cardiac catheterizations or stress test on chart review.  HPI  Past Medical History:  Diagnosis Date  . Allergic rhinitis, cause unspecified   . Arthritis    osteoarthritis  . Cancer ([place])    renal carcinoma  . COPD (chronic obstructive pulmonary disease) (HCC)    [name] FOLLOWS PT--PT USING OXYGEN 24 HRS A DAY  . COPD (chronic obstructive pulmonary disease) (HCC)    PULMONARY CLEARANCE FOR LEFT RENAL CRYOABLATION ON CHART FROM DR. CLANCE -07/26/11  . Coronary artery disease    CARDIAC CLEARANCE  OFFICE NOTE FOR LEFT CRYO ABLATION ON CHART FROM [name] -"RECENT STRESS TEST DID NOT SHOW ISCHEMIA" -"PRESUMED CAD DUE TO CORONARY CALCIFICATIONS"   . Dementia    Alzheimer's, senile dementia  . Family history of ischemic heart disease   . Family history of malignant neoplasm of gastrointestinal tract   . GERD (gastroesophageal reflux disease)   . Hypothyroidism   . Myocardial infarction ([place])   . Obesity hypoventilation syndrome ([place])    failed cpap/bipap trial  . Other and unspecified hyperlipidemia   . Other congenital hamartoses, not elsewhere classified   . Pneumonia    HX     . Shortness of breath   . Sleep apnea    OXYGEN 24 HR/DAY-NO CPAP  . Unspecified essential hypertension     Patient Active Problem List   Diagnosis Date Noted  . GERD (gastroesophageal reflux disease) 03/22/2017  . Anxiety 03/22/2017  . Hypomagnesemia 03/22/2017  . Chest pain 03/22/2017  . Musculoskeletal chest pain   . Bronchitis 10/10/2016  . CKD (chronic kidney disease) stage 3, GFR 30-59 ml/min (HCC) 10/10/2016  . HTN (hypertension) 10/10/2016  . Hypothyroid 10/10/2016  . Chronic diastolic CHF (congestive heart failure) ([place]) 03/03/2016  . Coronary artery disease 03/03/2016  . Thoracic aortic aneurysm ([place]) 03/03/2016  . Dementia 02/23/2016  . COPD (chronic obstructive pulmonary disease) ([place]) 02/23/2016  . Oliguria 02/23/2016  . Atypical chest pain 02/23/2016  . Chronic respiratory failure  with hypoxia and hypercapnia ([place]) 09/27/2015  . Morbid obesity ([place]) 07/13/2015  . Dyspnea on exertion 07/12/2015  . OSA (obstructive sleep apnea) 11/15/2011  . Renal mass, left 09/14/2011  . DISORDERS OF DIAPHRAGM 07/02/2010  . OBESITY HYPOVENTILATION SYNDROME 02/14/2009  . Multiple pulmonary nodules 02/14/2009  . CHOLELITHIASIS 05/04/2008  . Nonspecific (abnormal) findings on radiological and other examination of body structure 04/28/2008  . ABNORMAL CHEST XRAY 04/28/2008    . 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 Hypocalcemia 03/31/2008  . 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 DYSLIPIDEMIA 09/21/2007  . Hypertensive heart disease with CHF (congestive heart failure) ([place]) 09/21/2007  . Allergic rhinitis 09/21/2007    Past Surgical History:  Procedure Laterality Date  . CARPAL TUNNEL RELEASE     right  . CHOLECYSTECTOMY  2009  . EYE SURGERY     bilateral cataracts  . JOINT REPLACEMENT     bilateral knee replacements  . KNEE ARTHROPLASTY     2005 LEFT   AND 2007 RIGHT--[name]  . THYROIDECTOMY    . TUMOR REMOVAL  09/14/2011       Home Medications    Prior to Admission medications   Medication Sig Start Date End Date Taking? Authorizing Provider  albuterol (PROVENTIL) (2.5 MG/3ML) 0.083% nebulizer solution Take 2.5 mg by nebulization 3 (three) times daily. Inhale one vial via handheld nebulizer three times daily - with ipratropium    [provider]  calcitRIOL (ROCALTROL) 0.25 MCG capsule Take 1 capsule (0.25 mcg total) by mouth daily. Patient not taking: Reported on 06/11/2017 03/26/17   Barton Dubois, MD  calcitRIOL (ROCALTROL) 0.5 MCG capsule Take 0.5 mcg by mouth daily.    [provider]  Calcium Carb-Cholecalciferol (OYSCO 500+D PO) Take 2 tablets by mouth 4 (four) times daily. Before meals and at bedtime    [provider]  calcium carbonate (TUMS) 500 MG chewable tablet Chew 1,000 mg by mouth daily as needed for indigestion.     [provider]  calcium-vitamin D (OSCAL WITH D) 500-200 MG-UNIT tablet Take 2 tablets by mouth 4 (four) times daily -  before meals and at bedtime. 03/25/17   Barton Dubois, MD  cetirizine (ZYRTEC) 10 MG tablet Take 10 mg by mouth daily.    [provider]  citalopram (CELEXA) 20 MG tablet Take 20 mg by mouth daily.     [provider]  clopidogrel (PLAVIX) 75 MG tablet Take 75 mg by mouth daily.     [provider]  cycloSPORINE (RESTASIS) 0.05 % ophthalmic emulsion Place 1 drop into both eyes 2 (two) times daily.    [provider]  fluticasone (FLONASE) 50 MCG/ACT nasal spray Place 2 sprays into the nose daily as needed for allergies.     [provider]  HYDROcodone-acetaminophen (NORCO/VICODIN) 5-325 MG tablet Take 1 tablet by mouth every 6 (six) hours as needed for moderate pain. Patient taking differently: Take 1 tablet by mouth every 6 (six) hours as needed for moderate pain. Do not exceed 3 grams/24 hours of tylenol from all sources 02/27/16   Domenic Polite, MD  ipratropium (ATROVENT) 0.02 % nebulizer solution Take 0.5 mg by nebulization See admin instructions. Inhale one vial via handheld nebulizer twice daily - with albuterol    [provider]  levofloxacin (LEVAQUIN) 500 MG tablet Take 1 tablet (500 mg total) by mouth daily. Patient not taking: Reported on 06/11/2017 04/11/17   Forde Dandy, MD  levothyroxine Wilmer Floor,  LEVOTHROID) 200 MCG tablet Take 200 mcg by mouth See admin instructions. Take 1 tablet (200 mcg) by mouth daily - with a 25 mcg tablet for a 225 mcg dose    [provider]  levothyroxine (SYNTHROID, LEVOTHROID) 25 MCG tablet Take 25 mcg by mouth See admin instructions. Take 1 tablet (25 mcg) by mouth daily - with a 200 mcg tablet for a 225 mcg dose    [provider]  loratadine (CLARITIN) 10 MG tablet Take 10 mg by mouth daily.    [provider]  LORazepam (ATIVAN) 1 MG tablet Take 1 mg by mouth at bedtime.    [provider]  magnesium citrate SOLN Take 1 Bottle by mouth daily as needed for moderate constipation or severe constipation.    [provider]  Melatonin 3 MG TABS Take 3 mg by mouth at bedtime.     [provider]  metoprolol tartrate (LOPRESSOR) 25 MG tablet Take 12.5 mg by mouth See admin instructions. Take 1/2 tablet (12.5 mg) by mouth twice daily (hold for sbp <100)     [provider]  mometasone-formoterol (DULERA) 100-5 MCG/ACT AERO Inhale 2 puffs into the lungs 2 (two) times daily.    [provider]  OXYGEN Inhale into the lungs continuous.     [provider]  pantoprazole (PROTONIX) 20 MG tablet Take 1 tablet (20 mg total) by mouth daily. 03/25/17   Barton Dubois, MD  polyethylene glycol powder (MIRALAX) powder Take 17 g by mouth 2 (two) times daily as needed (constipaiton). Patient taking differently: Take 17 g by mouth daily. Mix in 8 oz glass of liquid and drink 03/25/17   Barton Dubois, MD  sodium chloride (OCEAN) 0.65 % SOLN nasal spray Place 2 sprays into both nostrils 3 (three) times daily as needed for congestion (sinus symptoms).     [provider]  Tamsulosin HCl (FLOMAX) 0.4 MG CAPS Take 0.4 mg by mouth daily after supper.     [provider]  traZODone (DESYREL) 150 MG tablet Take 300 mg by mouth at bedtime.     [provider]    Family History Family History  Problem Relation Age of Onset  . Cancer Other        colon  . Heart disease Other        CAD, MI and Heart Attack    Social History Social History   Tobacco Use  . Smoking status: Never Smoker  . Smokeless tobacco: Never Used  Substance Use Topics  . Alcohol use: No  . Drug use: No     Allergies   Patient has no known allergies.   Review of Systems Review of Systems  All other systems reviewed and are negative.    Physical Exam Updated Vital Signs BP (!) 101/56 (BP Location: Right Arm)   Pulse 63   Temp 98.4 F (36.9 C) (Oral)   Resp 20   SpO2 99%   Physical Exam  Constitutional: He appears well-developed and well-nourished.  Elderly male in no acute distress.  HENT:  Head: Normocephalic and atraumatic.  Right Ear: External ear normal.  Left Ear: External ear normal.  Nose: Nose normal.  Mouth/Throat: Uvula is midline, oropharynx is clear and moist and mucous membranes are normal. No tonsillar  exudate.  Eyes: Pupils are equal, round, and reactive to light. Right eye exhibits no discharge. Left eye exhibits no discharge. No scleral icterus.  Neck: Trachea normal. Neck supple. No spinous process tenderness  present. No neck rigidity. Normal range of motion present.  Cardiovascular: Normal rate, regular rhythm and intact distal pulses.  No murmur heard. Pulses:      Radial pulses are 2+ on the right side, and 2+ on the left side.       Dorsalis pedis pulses are 2+ on the right side, and 2+ on the left side.       Posterior tibial pulses are 2+ on the right side, and 2+ on the left side.  No lower extremity swelling or edema. Calves symmetric in size bilaterally.  Pulmonary/Chest: Effort normal and breath sounds normal. He has no decreased breath sounds. He has no wheezes. He has no rhonchi. He has no rales. He exhibits tenderness.    Abdominal: Soft. Bowel sounds are normal. There is no tenderness. There is no rebound and no guarding.  Musculoskeletal: He exhibits no edema.  Lymphadenopathy:    He has no cervical adenopathy.  Neurological: He is alert.  Skin: Skin is warm and dry. No rash noted. He is not diaphoretic.  Psychiatric: He has a normal mood and affect.  Nursing note and vitals reviewed.    ED Treatments / Results  Labs (all labs ordered are listed, but only abnormal results are displayed) Labs Reviewed  BASIC METABOLIC PANEL - Abnormal; Notable for the following components:      Result Value   Chloride 99 (*)    Glucose, Bld 135 (*)    BUN 23 (*)    Creatinine, Ser 1.45 (*)    GFR calc non Af Amer 45 (*)    GFR calc Af Amer 52 (*)    All other components within normal limits  CBC - Abnormal; Notable for the following components:   RBC 3.49 (*)    Hemoglobin 11.2 (*)    HCT 35.4 (*)    MCV 101.4 (*)    All other components within normal limits  I-STAT TROPONIN, ED    EKG  EKG Interpretation  Date/Time:  Friday October 03 2017 18:13:35  EST Ventricular Rate:  65 PR Interval:    QRS Duration: 92 QT Interval:  408 QTC Calculation: 424 R Axis:   -6 Text Interpretation:  sinus rhythm Low voltage QRS Abnormal ECG Confirmed by Virgel Manifold [phone]) on 10/03/2017 6:38:53 PM       Radiology Dg Chest 2 View  Result Date: 10/03/2017 CLINICAL DATA:  Chest pain EXAM: CHEST  2 VIEW COMPARISON:  06/11/2017 FINDINGS: Cardiac enlargement stable. There is decreased lung volumes with mild bibasilar atelectasis. No pleural effusion or edema. No airspace opacity. IMPRESSION: 1. Decreased lung volumes with bibasilar atelectasis. Electronically Signed   By: Kerby Moors M.D.   On: 10/03/2017 20:35    Procedures Procedures (including critical care time)  Medications Ordered in ED Medications - No data to display   Initial Impression / Assessment and Plan / ED Course  I have reviewed the triage vital signs and the nursing notes.  Pertinent labs & imaging results that were available during my care of the patient were reviewed by me and considered in my medical decision making (see chart for details).     76 year old male with history of CAD, hypertension presenting for intermittent chest pain over the last 1.5 days.  Patient describes this is a left-sided chest pain without radiation that lasts less than 1 minute and is without associated nausea or diaphoresis.  The pain is worsened by palpation.  It is not pleuritic, exertional or positional.  Patient  did receive 324 mg aspirin by EMS.  No nitroglycerin use.  On exam the patient is non-ill appearing.  He is without any active chest pain.  Vital signs are reassuring.  Patient is tender palpation over area of pain on exam.  Heart regular rate and rhythm.  Lungs are clear to auscultation bilaterally.  ECG unchanged from prior.  Negative troponin.  Negative chest x-ray. Heart score 3. Pt has been advised to return to the ED is CP becomes exertional, associated with diaphoresis or nausea,  radiates to left jaw/arm, worsens or becomes concerning in any way. Pt appears reliable for follow up and is agreeable to discharge. He is to follow up with PCP in 3 days.  He is hemodynamically stable and appears safe for discharge.  Case has been discussed with and seen by Dr. Wilson Singer who agrees with the above plan to discharge.   Final Clinical Impressions(s) / ED Diagnoses   Final diagnoses:  Chest pain, unspecified type    ED Discharge Orders    None       Lorelle Gibbs 10/03/17 2210    Virgel Manifold, MD 10/07/17 [phone]

## 2017-10-03 NOTE — ED Notes (Signed)
Pt wheeled to bathroom

## 2017-10-03 NOTE — ED Triage Notes (Signed)
Pt arrived EMS from [facility] with report of left lower chest pain worsening with inspiration. Pt given 324 [facility] PTA

## 2017-10-03 NOTE — ED Notes (Signed)
Patient transported to X-ray 

## 2017-10-03 NOTE — Discharge Instructions (Signed)
Read instructions below for reasons to return to the Emergency Department. It is recommended that your follow up with your Primary Care Doctor in regards to today's visit. If you do not have a doctor, use the resource guide listed below to help you find one.   Tests performed today include: An EKG of your heart A chest x-ray Cardiac enzymes - a blood test for heart muscle damage Blood counts and electrolytes Vital signs. See below for your results today.   Chest Pain (Nonspecific)  HOME CARE INSTRUCTIONS  For the next few days, avoid physical activities that bring on chest pain. Continue physical activities as directed.  Do not smoke cigarettes or drink alcohol until your symptoms are gone. If you do smoke, it is time to quit. You may receive instructions and counseling on how to stop smoking. Only take over-the-counter or prescription medicine for pain, discomfort, or fever as directed by your caregiver.  Follow your caregiver's suggestions for further testing if your chest pain does not go away.  Keep any follow-up appointments you made. If you do not go to an appointment, you could develop lasting (chronic) problems with pain. If there is any problem keeping an appointment, you must call to reschedule.  SEEK MEDICAL CARE IF:  You think you are having problems from the medicine you are taking. Read your medicine instructions carefully.  Your chest pain does not go away, even after treatment.  You develop a rash with blisters on your chest.  SEEK IMMEDIATE MEDICAL CARE IF:  You have increased chest pain or pain that spreads to your arm, neck, jaw, back, or belly (abdomen).  You develop shortness of breath, an increasing cough, or you are coughing up blood.  You have severe back or abdominal pain, feel sick to your stomach (nauseous) or throw up (vomit).  You develop severe weakness, fainting, or chills.  You have an oral temperature above 102 F (38.9 C), not controlled by medicine.  THIS  IS AN EMERGENCY. Do not wait to see if the pain will go away. Get medical help at once. Call your local emergency services (911 in [place] Do not drive yourself to the hospital. Additional Information:  Your vital signs today were: BP (!) 101/56 (BP Location: Right Arm)    Pulse 63    Temp 98.4 F (36.9 C) (Oral)    Resp 20    SpO2 99%  If your blood pressure (BP) was elevated above 135/85 this visit, please have this repeated by your doctor within one month. ---------------

## 2017-10-09 ENCOUNTER — Ambulatory Visit (HOSPITAL_COMMUNITY)
Admission: RE | Admit: 2017-10-09 | Discharge: 2017-10-09 | Disposition: A | Discharge status: Home / Self Care | Payer: Medicare Other | Source: Ambulatory Visit | Attending: Vascular Surgery | Admitting: Vascular Surgery

## 2017-10-09 ENCOUNTER — Encounter: Payer: Self-pay | Admitting: Vascular Surgery

## 2017-10-09 ENCOUNTER — Ambulatory Visit (INDEPENDENT_AMBULATORY_CARE_PROVIDER_SITE_OTHER): Payer: Medicare Other | Admitting: Vascular Surgery

## 2017-10-09 VITALS — BP 110/69 | HR 78 | Temp 97.0°F | Resp 22 | Ht 75.0 in | Wt 248.0 lb

## 2017-10-09 DIAGNOSIS — R2231 Localized swelling, mass and lump, right upper limb: Secondary | ICD-10-CM | POA: Diagnosis not present

## 2017-10-09 DIAGNOSIS — M79601 Pain in right arm: Secondary | ICD-10-CM | POA: Diagnosis not present

## 2017-10-09 DIAGNOSIS — Q2730 Arteriovenous malformation, site unspecified: Secondary | ICD-10-CM

## 2017-10-09 NOTE — Progress Notes (Signed)
History of Present Illness:  Patient is a 76 y.o. year old male who presents for placement with a chief complaint of palpable small mass on the volar surface of his right upper arm.  The seldom bother him and he noticed them about 2 months ago.  If he rests his arm on something for a long time putting pressure on the area it gives him discomfort.  Other wise he has no problems with the right arm.   Other chronic medical problems include  CKD, Hypercholesterolemia, osteoarthritis, and COPD with continuous O2.   Past Medical History:  Diagnosis Date  . Allergic rhinitis, cause unspecified   . Arthritis    osteoarthritis  . Cancer ([place])    renal carcinoma  . COPD (chronic obstructive pulmonary disease) (HCC)    [name] FOLLOWS PT--PT USING OXYGEN 24 HRS A DAY  . COPD (chronic obstructive pulmonary disease) (HCC)    PULMONARY CLEARANCE FOR LEFT RENAL CRYOABLATION ON CHART FROM DR. CLANCE -07/26/11  . Coronary artery disease    CARDIAC CLEARANCE  OFFICE NOTE FOR LEFT CRYO ABLATION ON CHART FROM [name] -"RECENT STRESS TEST DID NOT SHOW ISCHEMIA" -"PRESUMED CAD DUE TO CORONARY CALCIFICATIONS"   . Dementia    Alzheimer's, senile dementia  . Family history of ischemic heart disease   . Family history of malignant neoplasm of gastrointestinal tract   . GERD (gastroesophageal reflux disease)   . Hypothyroidism   . Myocardial infarction ([place])   . Obesity hypoventilation syndrome ([place])    failed cpap/bipap trial  . Other and unspecified hyperlipidemia   . Other congenital hamartoses, not elsewhere classified   . Pneumonia    HX     . Shortness of breath   . Sleep apnea    OXYGEN 24 HR/DAY-NO CPAP  . Unspecified essential hypertension     Past Surgical History:  Procedure Laterality Date  . CARPAL TUNNEL RELEASE     right  . CHOLECYSTECTOMY  2009  . EYE SURGERY     bilateral cataracts  . JOINT REPLACEMENT     bilateral knee replacements  . KNEE ARTHROPLASTY     2005 LEFT   AND 2007 RIGHT--[name]  . THYROIDECTOMY    . TUMOR REMOVAL  09/14/2011     Social History Social History   Tobacco Use  . Smoking status: Never Smoker  . Smokeless tobacco: Never Used  Substance Use Topics  . Alcohol use: No  . Drug use: No    Family History Family History  Problem Relation Age of Onset  . Cancer Other        colon  . Heart disease Other        CAD, MI and Heart Attack    Allergies  No Known Allergies   Current Outpatient Medications  Medication Sig Dispense Refill  . albuterol (PROVENTIL) (2.5 MG/3ML) 0.083% nebulizer solution Take 2.5 mg by nebulization 3 (three) times daily. Inhale one vial via handheld nebulizer three times daily - with ipratropium    . amitriptyline (ELAVIL) 25 MG tablet Take 25 mg by mouth at bedtime.    . busPIRone (BUSPAR) 15 MG tablet Take 15 mg by mouth 3 (three) times daily.    . calcitRIOL (ROCALTROL) 0.5 MCG capsule Take 0.5 mcg by mouth daily.    . calcium carbonate (TUMS) 500 MG chewable tablet Chew 1,000 mg by mouth daily as needed for indigestion.     . calcium-vitamin D (OSCAL WITH D) 500-200 MG-UNIT  tablet Take 2 tablets by mouth 4 (four) times daily -  before meals and at bedtime. 180 tablet 1  . cetirizine (ZYRTEC) 10 MG tablet Take 10 mg by mouth daily.    . citalopram (CELEXA) 20 MG tablet Take 20 mg by mouth daily.     . clopidogrel (PLAVIX) 75 MG tablet Take 75 mg by mouth daily.     . cycloSPORINE (RESTASIS) 0.05 % ophthalmic emulsion Place 1 drop into both eyes 2 (two) times daily.    . fluticasone (FLONASE) 50 MCG/ACT nasal spray Place 2 sprays into the nose daily as needed for allergies.     . furosemide (LASIX) 20 MG tablet Take 20 mg by mouth.    Marland Kitchen guaiFENesin-codeine (ROBITUSSIN AC) 100-10 MG/5ML syrup Take 5 mLs by mouth 3 (three) times daily as needed for cough.    Marland Kitchen HYDROcodone-acetaminophen (NORCO/VICODIN) 5-325 MG tablet Take 1 tablet by mouth every 6 (six) hours as needed for moderate  pain. (Patient taking differently: Take 1 tablet by mouth every 6 (six) hours as needed for moderate pain. Do not exceed 3 grams/24 hours of tylenol from all sources) 30 tablet 0  . ipratropium (ATROVENT) 0.02 % nebulizer solution Take 0.5 mg by nebulization See admin instructions. Inhale one vial via handheld nebulizer twice daily - with albuterol    . levofloxacin (LEVAQUIN) 500 MG tablet Take 1 tablet (500 mg total) by mouth daily. 10 tablet 0  . levothyroxine (SYNTHROID, LEVOTHROID) 200 MCG tablet Take 200 mcg by mouth See admin instructions. Take 1 tablet (200 mcg) by mouth daily - with a 25 mcg tablet for a 225 mcg dose    . levothyroxine (SYNTHROID, LEVOTHROID) 25 MCG tablet Take 25 mcg by mouth See admin instructions. Take 1 tablet (25 mcg) by mouth daily - with a 200 mcg tablet for a 225 mcg dose    . loratadine (CLARITIN) 10 MG tablet Take 10 mg by mouth daily.    Marland Kitchen LORazepam (ATIVAN) 1 MG tablet Take 1 mg by mouth at bedtime.    . magnesium citrate SOLN Take 1 Bottle by mouth daily as needed for moderate constipation or severe constipation.    . Melatonin 3 MG TABS Take 3 mg by mouth at bedtime.     . metoprolol tartrate (LOPRESSOR) 25 MG tablet Take 12.5 mg by mouth See admin instructions. Take 1/2 tablet (12.5 mg) by mouth twice daily (hold for sbp <100)    . mometasone-formoterol (DULERA) 100-5 MCG/ACT AERO Inhale 2 puffs into the lungs 2 (two) times daily.    Marland Kitchen nystatin (MYCOSTATIN) 100000 UNIT/ML suspension Take 5 mLs by mouth 4 (four) times daily.    Marland Kitchen nystatin [facility]) powder Apply topically 4 (four) times daily.    . OXYGEN Inhale into the lungs continuous.     . pantoprazole (PROTONIX) 20 MG tablet Take 1 tablet (20 mg total) by mouth daily. 30 tablet 1  . polyethylene glycol powder (MIRALAX) powder Take 17 g by mouth 2 (two) times daily as needed (constipaiton). (Patient taking differently: Take 17 g by mouth daily. Mix in 8 oz glass of liquid and drink)    . sodium chloride  (OCEAN) 0.65 % SOLN nasal spray Place 2 sprays into both nostrils 3 (three) times daily as needed for congestion (sinus symptoms).     . Tamsulosin HCl (FLOMAX) 0.4 MG CAPS Take 0.4 mg by mouth daily after supper.     . traZODone (DESYREL) 150 MG tablet Take 300 mg by mouth  at bedtime.     . Calcium Carb-Cholecalciferol (OYSCO 500+D PO) Take 2 tablets by mouth 4 (four) times daily. Before meals and at bedtime     No current facility-administered medications for this visit.     ROS:   General:  No weight loss, Fever, chills  HEENT: No recent headaches, no nasal bleeding, no visual changes, no sore throat  Neurologic: No dizziness, blackouts, seizures. No recent symptoms of stroke or mini- stroke. No recent episodes of slurred speech, or temporary blindness.  Cardiac: No recent episodes of chest pain/pressure, no shortness of breath at rest.  No shortness of breath with exertion.  Denies history of atrial fibrillation or irregular heartbeat  Vascular: No history of rest pain in feet.  No history of claudication.  No history of non-healing ulcer, No history of DVT   Pulmonary: on home oxygen, no productive cough, no hemoptysis,  No asthma or wheezing  Musculoskeletal:  [x ] Arthritis, [x ] Low back pain,  [x ] Joint pain  Hematologic:No history of hypercoagulable state.  No history of easy bleeding.  No history of anemia  Gastrointestinal: No hematochezia or melena,  No gastroesophageal reflux, no trouble swallowing  Urinary: [x ] chronic Kidney disease, [ ]  on HD - [ ]  MWF or [ ]  TTHS, [ ]  Burning with urination, [ ]  Frequent urination, [ ]  Difficulty urinating;   Skin: No rashes  Psychological: No history of anxiety,  No history of depression   Physical Examination  Vitals:   10/09/17 1305  BP: 110/69  Pulse: 78  Resp: (!) 22  Temp: (!) 97 F (36.1 C)  TempSrc: Oral  SpO2: 91%  Weight: 248 lb (112.5 kg)  Height: 6\' 3"  (1.905 m)    Body mass index is 31  kg/m.  General:  Alert and oriented, no acute distress HEENT: Normal Neck: No bruit or JVD Pulmonary: Clear to auscultation bilaterally Cardiac: Regular Rate and Rhythm positive murmur Gastrointestinal: Soft, non-tender, non-distended, no mass, no scars Skin: palpable soft masses x 3 in the right UE volar surface.  No skin discoloration or openings in the skin Extremity Pulses:  2+ radial, brachial pulses bilaterally Musculoskeletal: No deformity or edema  Neurologic: Upper and lower extremity motor intact and equal   DATA:  Right UE duplex: Arterio-venous malformation with low resistance flow.   ASSESSMENT:  Right UE Arterio-venous malformation  PLAN: Right av malformation with patent brachial and radial blood flow.  He seldom notices that they are present and is not currently intercepted in further work.  If he develops daily symptoms in the future we will order an MRI of the right UE.  He will f/u PRN.    Roxy Horseman PA-C [facility]  The patient was seen in conjunction with Dr. Oneida Alar  History and exam details as above.  Patient most likely has an AVM in his right arm.  He has a fairly minimal symptoms from this.  These usually have a fairly benign course.  He has multiple comorbidities and not a very good candidate for any treatment options.  Additionally treatment options for AVMs are not really ideal.  I discussed with the patient further evaluation of this with an MRI to define the extent of the AVM.  However with his minimal symptoms I believe the best option for now would just be observation as I do not believe these are going to represent anything more than a benign course.  The patient will follow  up with me on as-needed basis if his symptoms worsen over time.  Plan at that point would be to get an MRI scan of the right upper extremity if it worsens.  Ruta Hinds, MD [facility] Office:  [phone] Pager: [phone]

## 2018-02-17 IMAGING — CR DG RIBS W/ CHEST 3+V*L*
4 series · 4 of 4 positions shown · non-contrast
Comparison: 03/31/2017 and 03/22/2017

CLINICAL DATA: Left rib pain secondary to a fall from a toilet this
morning.

EXAM:
LEFT RIBS AND CHEST - 3+ VIEW

[w chest pa]
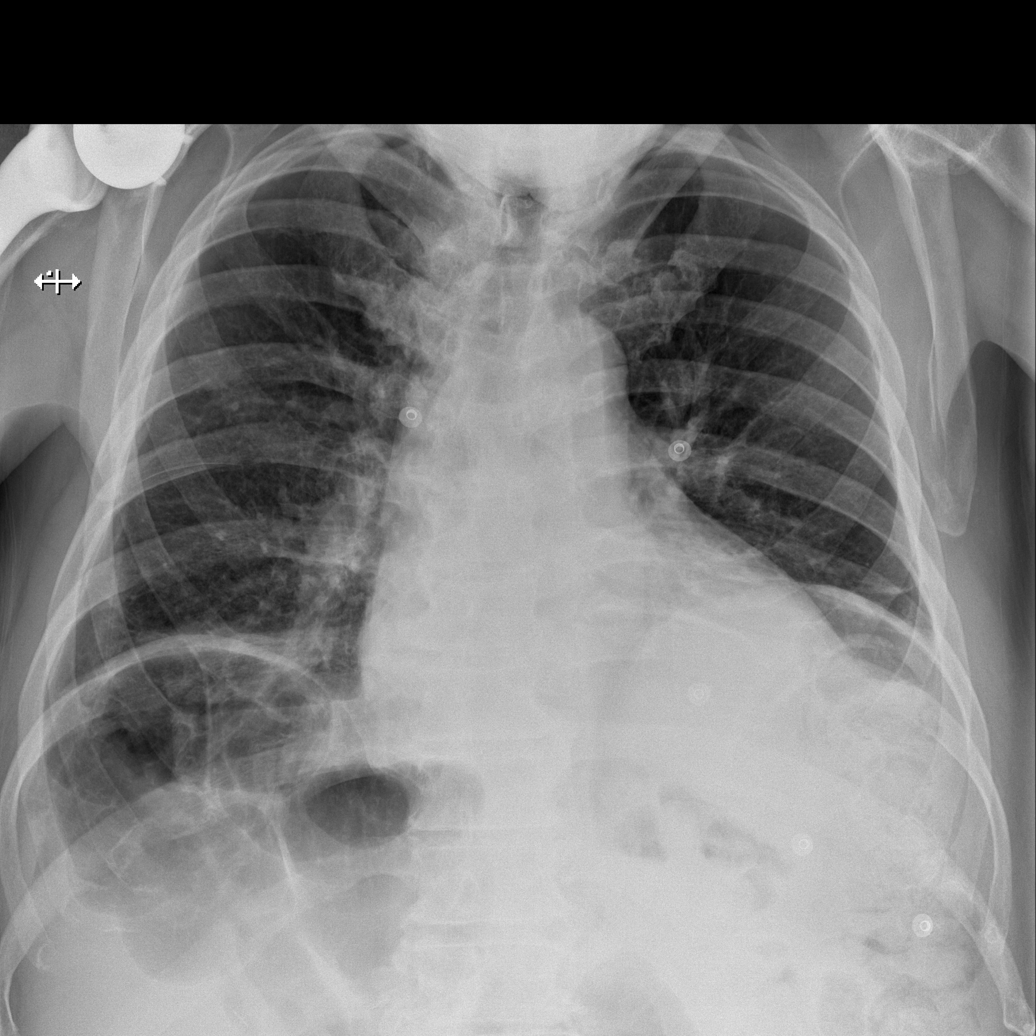

[w ribs ap upper left]
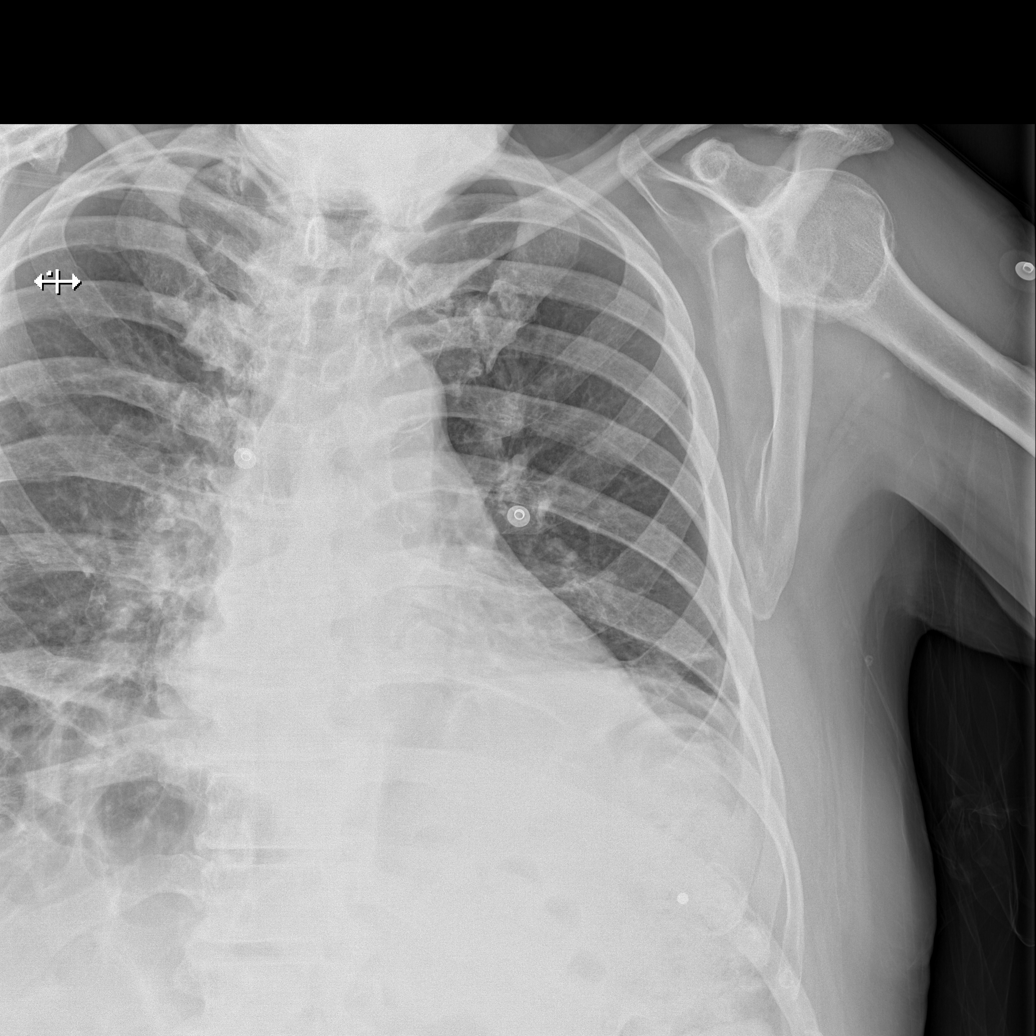

[w ribs ap lower left]
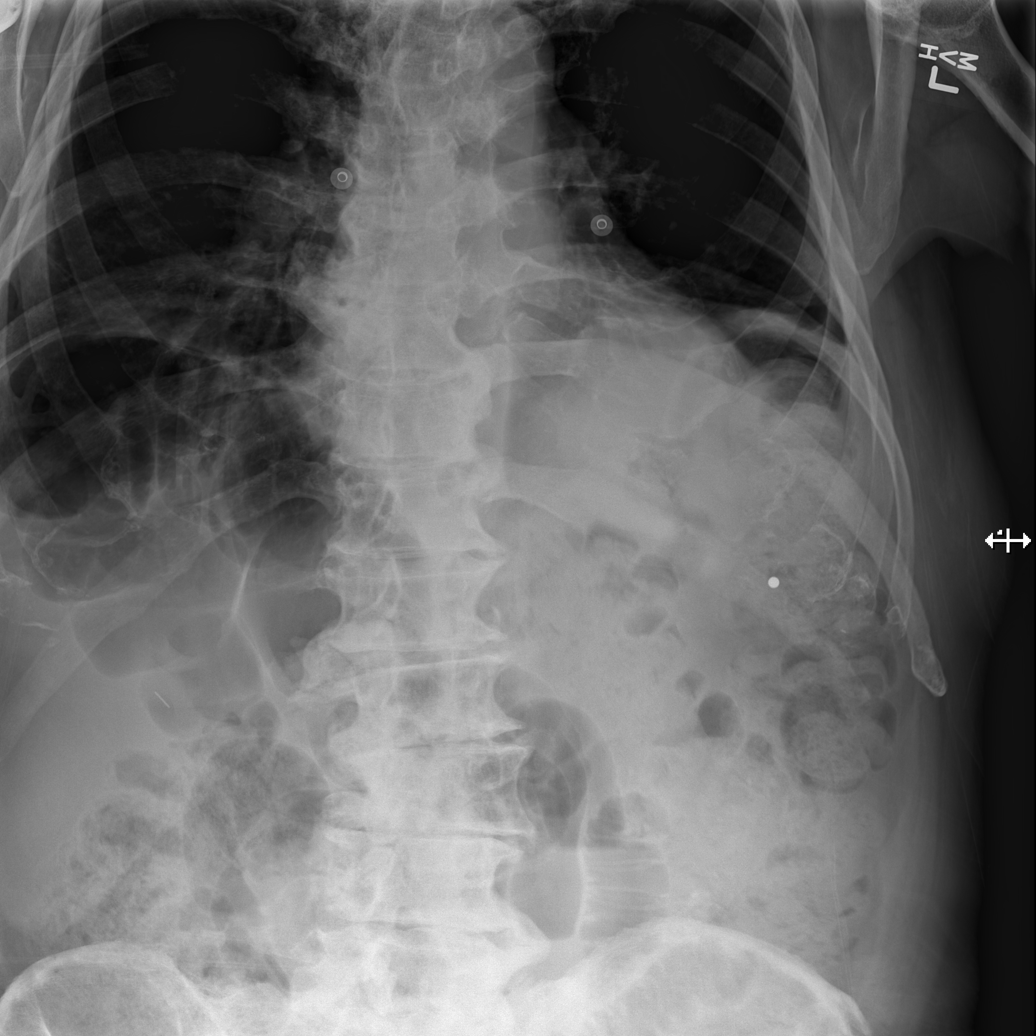

[w ribs obl left]
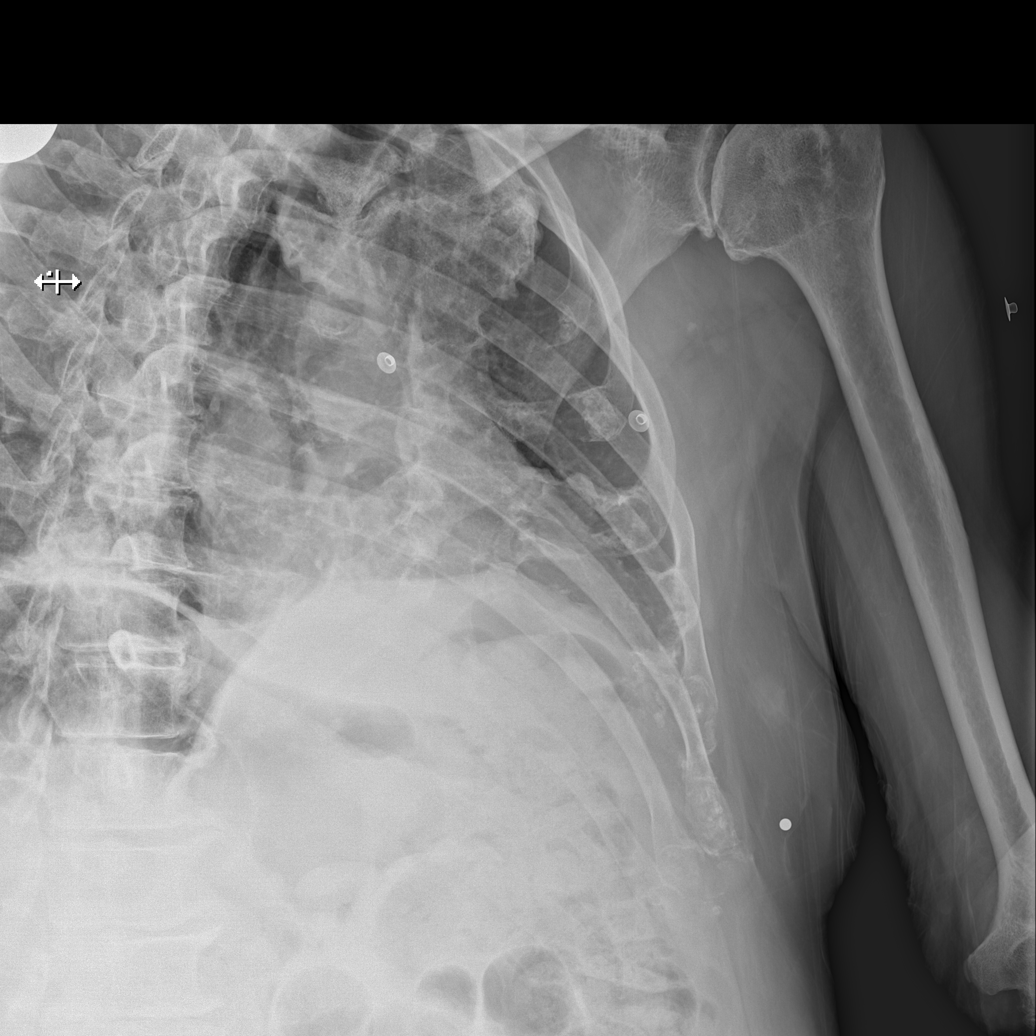

[4 of 4 positions shown; findings below may reference images not displayed]

FINDINGS: No fracture or other bone lesions are seen involving the ribs. There
is no evidence of pneumothorax or pleural effusion. Both lungs are
clear except for minimal linear scarring at the left base laterally.
Heart size and mediastinal contours are within normal limits.
IMPRESSION: Negative.

## 2018-02-17 IMAGING — CT CT CERVICAL SPINE W/O CM
3 of 4 series · 12 of 33 positions shown, 14 images · non-contrast
Comparison: Radiographs 11/05/2016 and CT scan 03/15/2011

CLINICAL DATA: Fell this morning at home and injured neck.

EXAM:
CT CERVICAL SPINE WITHOUT CONTRAST
TECHNIQUE: Multidetector CT imaging of the cervical spine was performed without
intravenous contrast. Multiplanar CT image reconstructions were also
generated.

[Series 3: c-spine st · axial · 0.42mm/px · z∈[+1236,+1388]mm · 4 of 114 slices shown, 5 images]
[im 19/114  soft-tissue]
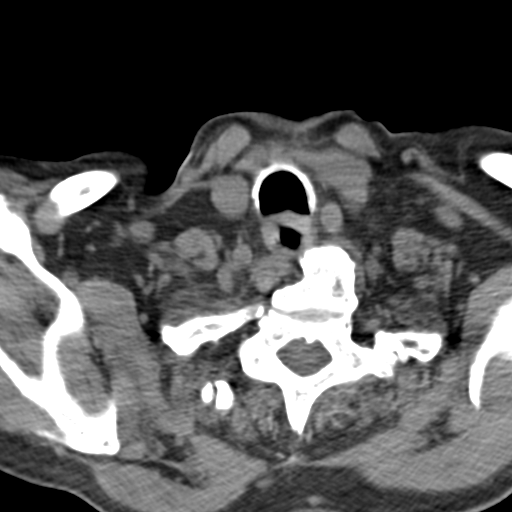
[im 19/114  bone]
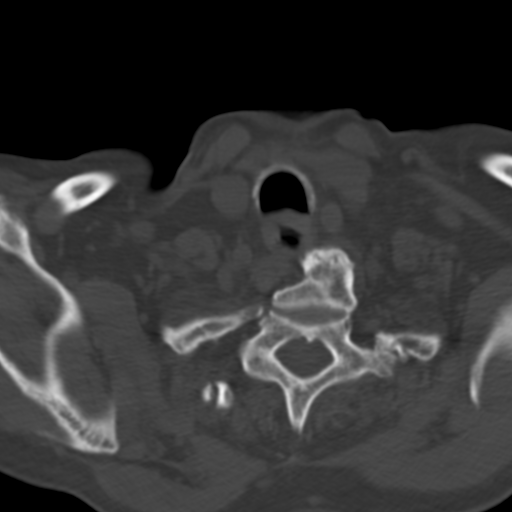
[im 38/114  bone]
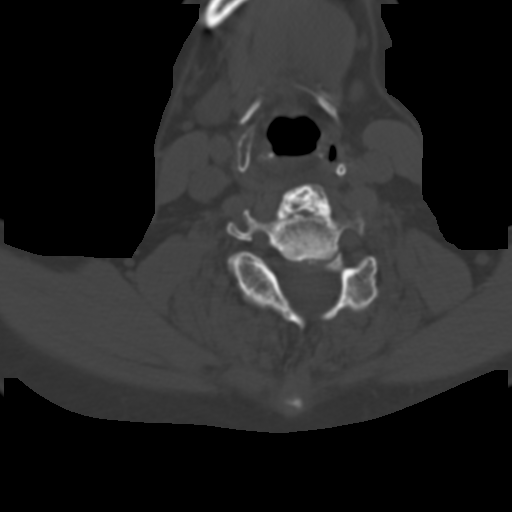
[im 76/114  bone]
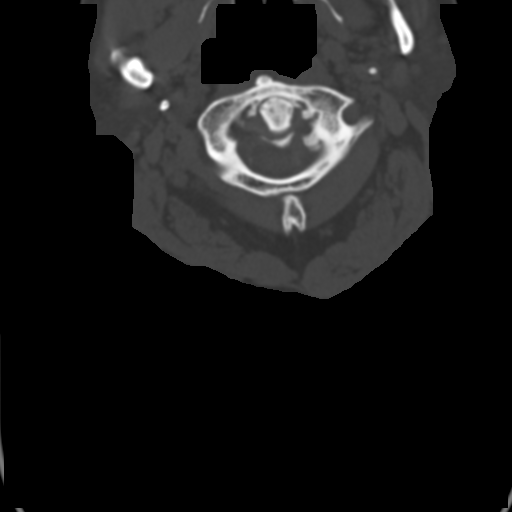
[im 95/114  bone]
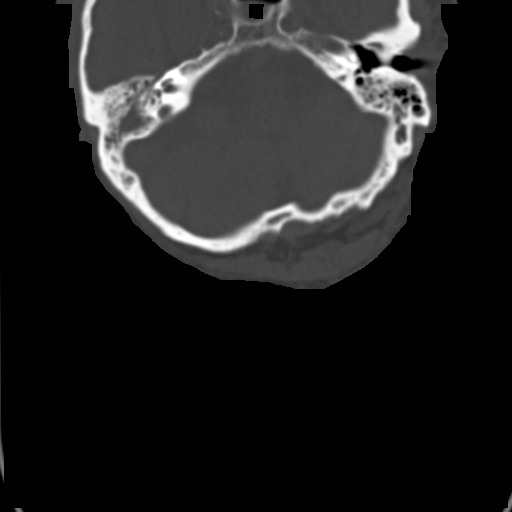

[Series 7: coronal recons · coronal · 0.33mm/px · 3 of 82 slices shown]
[im 27/82  bone]
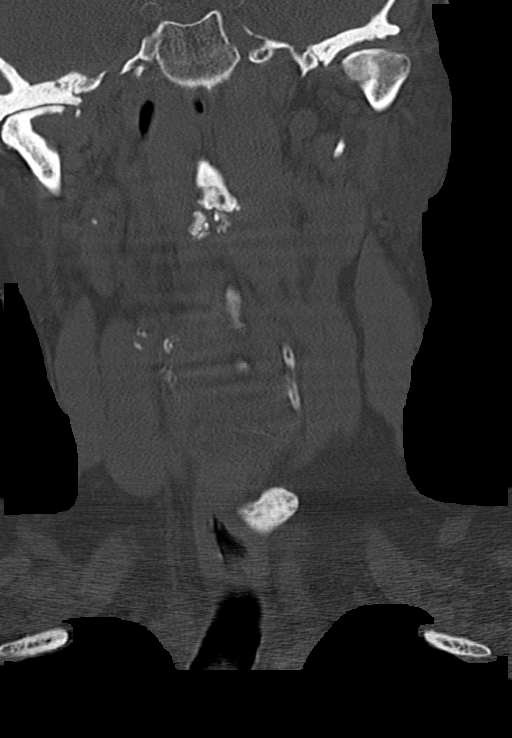
[im 36/82  bone]
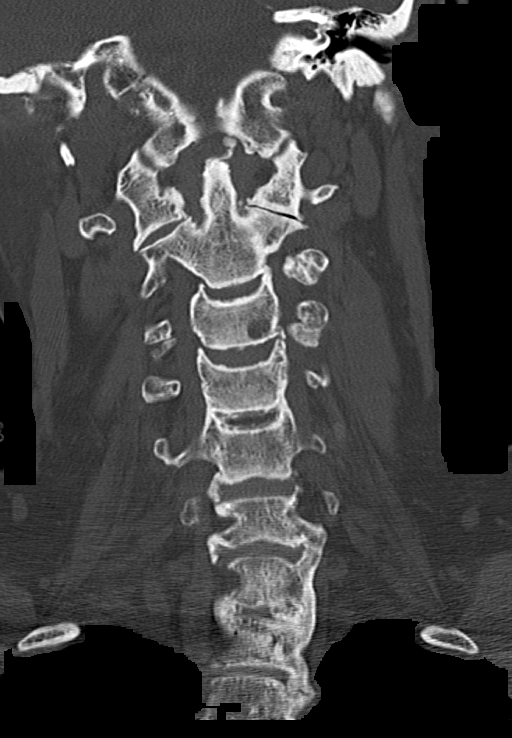
[im 46/82  bone]
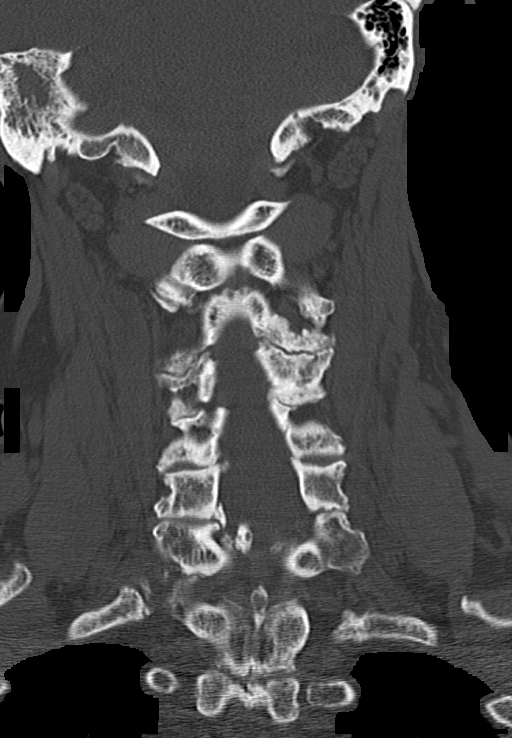

[Series 8: sagittal recons · sagittal · 0.33mm/px · 5 of 61 slices shown, 6 images]
[im 21/61  bone]
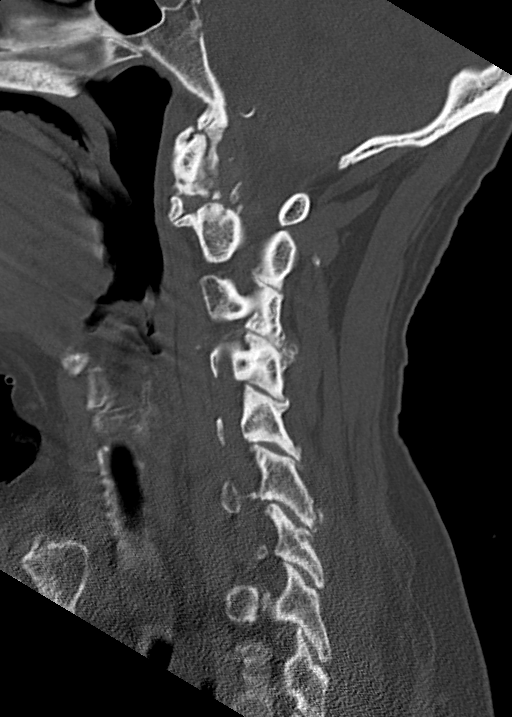
[im 26/61  bone]
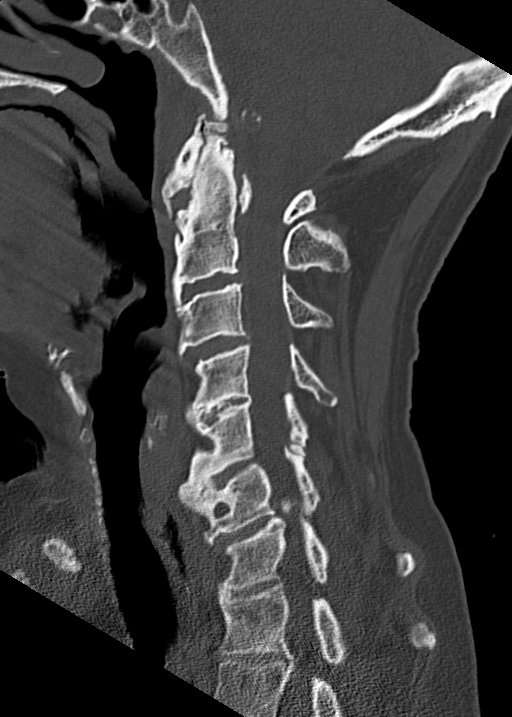
[im 31/61  soft-tissue]
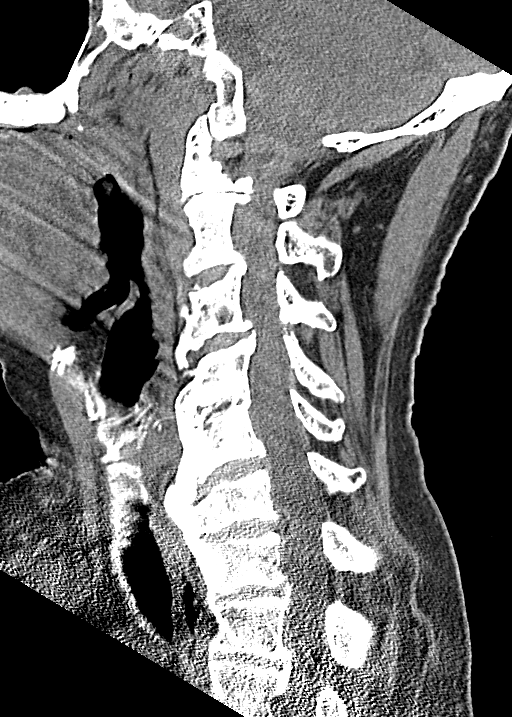
[im 31/61  bone]
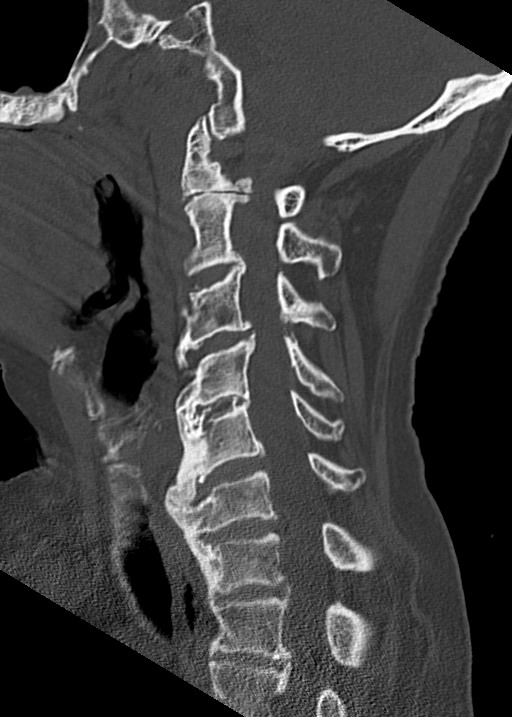
[im 36/61  bone]
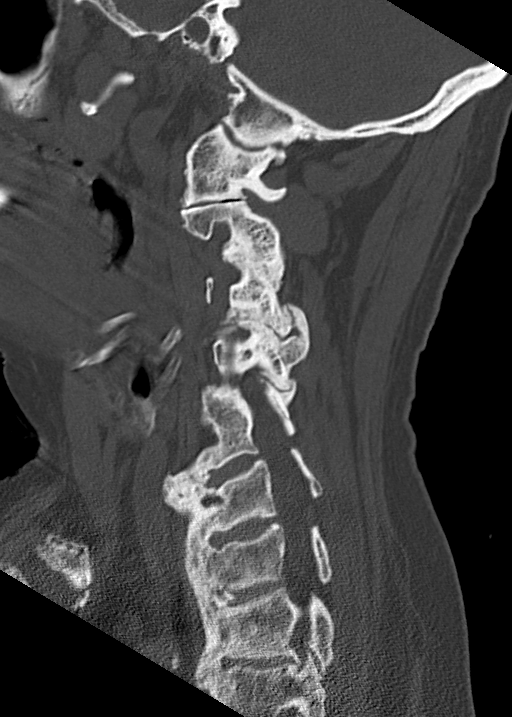
[im 41/61  bone]
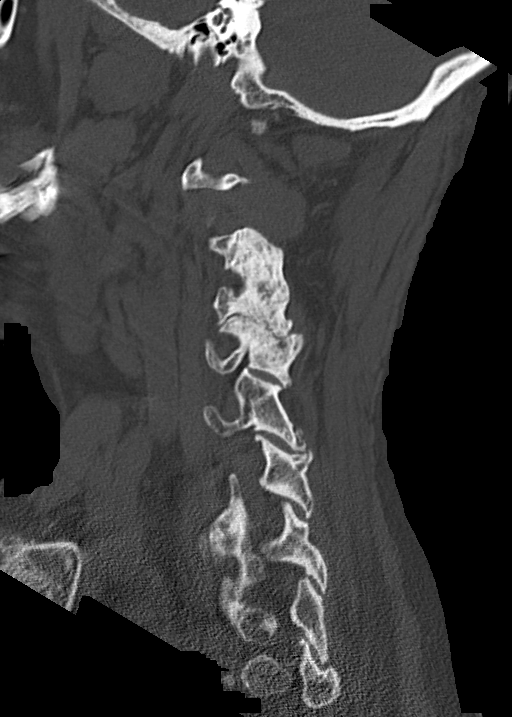

[12 of 33 positions shown; findings below may reference images not displayed]

FINDINGS: Alignment: Advanced degenerative cervical spondylosis with
multilevel disc disease and facet disease and mild multilevel
degenerative subluxations. The overall alignment is normal and
stable.

Skull base and vertebrae: No acute fractures identified. Severe
degenerative changes. Advanced C1-2 degenerative changes but no dens
or C1 fracture.

Soft tissues and spinal canal: No abnormal prevertebral soft tissue
swelling.

Disc levels: Overall stable multilevel disc disease and facet
disease but the spinal canal is fairly generous and there is no
significant spinal stenosis. There is significant left-sided
foraminal stenosis at C2-3 due to uncinate spurring and severe
left-sided facet disease. Bilateral severe foraminal stenosis at
C3-4, left greater than right. Moderate-to-severe right-sided
foraminal stenosis at C6-7.

Upper chest: No significant findings.

Other: Advanced carotid and vertebral artery calcifications. No neck
mass or adenopathy.
IMPRESSION: 1. Advanced degenerative cervical spondylosis with multilevel disc
disease and facet disease not significantly change when compared to
6166.
2. No acute cervical spine fracture or abnormal prevertebral soft
tissue swelling.
3. Stable appearing significant left-sided foraminal stenosis at
C2-3, bilateral stenosis at C3-4 and right-sided foraminal stenosis
at C6-7.
4. Advanced vascular calcifications.

## 2019-07-22 DEATH — deceased
